# Patient Record
Sex: Female | Born: 1975 | Race: White | Hispanic: No | Marital: Married | Smoking: Never smoker
Health system: Southern US, Community
[De-identification: ages and names within clinical notes are randomized; demographics above are authoritative.]

## PROBLEM LIST (undated history)

## (undated) DIAGNOSIS — M791 Myalgia, unspecified site: Secondary | ICD-10-CM

## (undated) DIAGNOSIS — E559 Vitamin D deficiency, unspecified: Secondary | ICD-10-CM

## (undated) DIAGNOSIS — Z5189 Encounter for other specified aftercare: Secondary | ICD-10-CM

## (undated) DIAGNOSIS — M1990 Unspecified osteoarthritis, unspecified site: Secondary | ICD-10-CM

## (undated) DIAGNOSIS — F419 Anxiety disorder, unspecified: Secondary | ICD-10-CM

## (undated) DIAGNOSIS — T7840XA Allergy, unspecified, initial encounter: Secondary | ICD-10-CM

## (undated) DIAGNOSIS — Z8489 Family history of other specified conditions: Secondary | ICD-10-CM

## (undated) DIAGNOSIS — F32A Depression, unspecified: Secondary | ICD-10-CM

## (undated) DIAGNOSIS — H547 Unspecified visual loss: Secondary | ICD-10-CM

## (undated) DIAGNOSIS — K259 Gastric ulcer, unspecified as acute or chronic, without hemorrhage or perforation: Secondary | ICD-10-CM

## (undated) DIAGNOSIS — R51 Headache: Secondary | ICD-10-CM

## (undated) DIAGNOSIS — F329 Major depressive disorder, single episode, unspecified: Secondary | ICD-10-CM

## (undated) DIAGNOSIS — R0602 Shortness of breath: Secondary | ICD-10-CM

## (undated) DIAGNOSIS — K219 Gastro-esophageal reflux disease without esophagitis: Secondary | ICD-10-CM

## (undated) DIAGNOSIS — D649 Anemia, unspecified: Secondary | ICD-10-CM

## (undated) DIAGNOSIS — G43909 Migraine, unspecified, not intractable, without status migrainosus: Secondary | ICD-10-CM

## (undated) DIAGNOSIS — G8929 Other chronic pain: Secondary | ICD-10-CM

## (undated) DIAGNOSIS — F988 Other specified behavioral and emotional disorders with onset usually occurring in childhood and adolescence: Secondary | ICD-10-CM

## (undated) DIAGNOSIS — E538 Deficiency of other specified B group vitamins: Secondary | ICD-10-CM

## (undated) DIAGNOSIS — M2550 Pain in unspecified joint: Secondary | ICD-10-CM

## (undated) DIAGNOSIS — E669 Obesity, unspecified: Secondary | ICD-10-CM

## (undated) DIAGNOSIS — R600 Localized edema: Secondary | ICD-10-CM

## (undated) DIAGNOSIS — G4730 Sleep apnea, unspecified: Secondary | ICD-10-CM

## (undated) DIAGNOSIS — R519 Headache, unspecified: Secondary | ICD-10-CM

## (undated) DIAGNOSIS — N926 Irregular menstruation, unspecified: Secondary | ICD-10-CM

## (undated) DIAGNOSIS — R1310 Dysphagia, unspecified: Secondary | ICD-10-CM

## (undated) DIAGNOSIS — M797 Fibromyalgia: Secondary | ICD-10-CM

## (undated) DIAGNOSIS — E739 Lactose intolerance, unspecified: Secondary | ICD-10-CM

## (undated) DIAGNOSIS — M7070 Other bursitis of hip, unspecified hip: Secondary | ICD-10-CM

## (undated) HISTORY — DX: Other bursitis of hip, unspecified hip: M70.70

## (undated) HISTORY — DX: Unspecified visual loss: H54.7

## (undated) HISTORY — PX: SPINE SURGERY: SHX786

## (undated) HISTORY — DX: Gastric ulcer, unspecified as acute or chronic, without hemorrhage or perforation: K25.9

## (undated) HISTORY — DX: Anemia, unspecified: D64.9

## (undated) HISTORY — DX: Gastro-esophageal reflux disease without esophagitis: K21.9

## (undated) HISTORY — PX: TOTAL ABDOMINAL HYSTERECTOMY: SHX209

## (undated) HISTORY — DX: Pain in unspecified joint: M25.50

## (undated) HISTORY — PX: LUMBAR LAMINECTOMY: SHX95

## (undated) HISTORY — DX: Depression, unspecified: F32.A

## (undated) HISTORY — DX: Obesity, unspecified: E66.9

## (undated) HISTORY — DX: Shortness of breath: R06.02

## (undated) HISTORY — DX: Other chronic pain: G89.29

## (undated) HISTORY — DX: Allergy, unspecified, initial encounter: T78.40XA

## (undated) HISTORY — DX: Headache: R51

## (undated) HISTORY — DX: Deficiency of other specified B group vitamins: E53.8

## (undated) HISTORY — DX: Irregular menstruation, unspecified: N92.6

## (undated) HISTORY — DX: Other specified behavioral and emotional disorders with onset usually occurring in childhood and adolescence: F98.8

## (undated) HISTORY — DX: Myalgia, unspecified site: M79.10

## (undated) HISTORY — DX: Unspecified osteoarthritis, unspecified site: M19.90

## (undated) HISTORY — DX: Anxiety disorder, unspecified: F41.9

## (undated) HISTORY — DX: Localized edema: R60.0

## (undated) HISTORY — DX: Migraine, unspecified, not intractable, without status migrainosus: G43.909

## (undated) HISTORY — DX: Dysphagia, unspecified: R13.10

## (undated) HISTORY — DX: Encounter for other specified aftercare: Z51.89

## (undated) HISTORY — DX: Sleep apnea, unspecified: G47.30

## (undated) HISTORY — DX: Lactose intolerance, unspecified: E73.9

## (undated) HISTORY — DX: Fibromyalgia: M79.7

## (undated) HISTORY — DX: Vitamin D deficiency, unspecified: E55.9

## (undated) HISTORY — DX: Headache, unspecified: R51.9

## (undated) HISTORY — DX: Major depressive disorder, single episode, unspecified: F32.9

## (undated) HISTORY — PX: ABDOMINAL HYSTERECTOMY: SHX81

---
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
## 2001-07-17 ENCOUNTER — Other Ambulatory Visit: Admission: RE | Admit: 2001-07-17 | Discharge: 2001-07-17 | Payer: Self-pay | Admitting: Family Medicine

## 2001-10-29 HISTORY — PX: DILATION AND CURETTAGE OF UTERUS: SHX78

## 2002-06-04 ENCOUNTER — Encounter (INDEPENDENT_AMBULATORY_CARE_PROVIDER_SITE_OTHER): Payer: Self-pay | Admitting: *Deleted

## 2002-06-04 ENCOUNTER — Ambulatory Visit (HOSPITAL_COMMUNITY): Admission: RE | Admit: 2002-06-04 | Discharge: 2002-06-04 | Payer: Self-pay | Admitting: Obstetrics and Gynecology

## 2003-02-19 ENCOUNTER — Other Ambulatory Visit: Admission: RE | Admit: 2003-02-19 | Discharge: 2003-02-19 | Payer: Self-pay | Admitting: Obstetrics and Gynecology

## 2003-06-23 ENCOUNTER — Ambulatory Visit (HOSPITAL_COMMUNITY): Admission: RE | Admit: 2003-06-23 | Discharge: 2003-06-23 | Payer: Self-pay | Admitting: Obstetrics and Gynecology

## 2003-09-04 ENCOUNTER — Inpatient Hospital Stay (HOSPITAL_COMMUNITY): Admission: AD | Admit: 2003-09-04 | Discharge: 2003-09-04 | Payer: Self-pay | Admitting: Obstetrics & Gynecology

## 2003-09-20 ENCOUNTER — Inpatient Hospital Stay (HOSPITAL_COMMUNITY): Admission: AD | Admit: 2003-09-20 | Discharge: 2003-09-22 | Payer: Self-pay | Admitting: Obstetrics and Gynecology

## 2003-11-03 ENCOUNTER — Other Ambulatory Visit: Admission: RE | Admit: 2003-11-03 | Discharge: 2003-11-03 | Payer: Self-pay | Admitting: Obstetrics and Gynecology

## 2004-12-11 ENCOUNTER — Other Ambulatory Visit: Admission: RE | Admit: 2004-12-11 | Discharge: 2004-12-11 | Payer: Self-pay | Admitting: Obstetrics and Gynecology

## 2005-03-19 ENCOUNTER — Ambulatory Visit: Payer: Self-pay | Admitting: Internal Medicine

## 2005-04-12 ENCOUNTER — Ambulatory Visit: Payer: Self-pay | Admitting: Family Medicine

## 2005-08-03 ENCOUNTER — Ambulatory Visit: Payer: Self-pay | Admitting: Family Medicine

## 2005-08-21 ENCOUNTER — Ambulatory Visit: Payer: Self-pay | Admitting: Family Medicine

## 2005-12-14 ENCOUNTER — Other Ambulatory Visit: Admission: RE | Admit: 2005-12-14 | Discharge: 2005-12-14 | Payer: Self-pay | Admitting: Obstetrics and Gynecology

## 2006-02-25 ENCOUNTER — Ambulatory Visit: Payer: Self-pay | Admitting: Family Medicine

## 2006-02-26 HISTORY — PX: LIPOMA EXCISION: SHX5283

## 2006-03-01 ENCOUNTER — Ambulatory Visit (HOSPITAL_BASED_OUTPATIENT_CLINIC_OR_DEPARTMENT_OTHER): Admission: RE | Admit: 2006-03-01 | Discharge: 2006-03-01 | Payer: Self-pay | Admitting: Surgery

## 2006-03-01 ENCOUNTER — Encounter (INDEPENDENT_AMBULATORY_CARE_PROVIDER_SITE_OTHER): Payer: Self-pay | Admitting: Specialist

## 2006-03-29 ENCOUNTER — Ambulatory Visit: Payer: Self-pay | Admitting: Family Medicine

## 2006-08-12 ENCOUNTER — Ambulatory Visit: Payer: Self-pay | Admitting: Family Medicine

## 2006-08-26 ENCOUNTER — Ambulatory Visit: Payer: Self-pay | Admitting: Family Medicine

## 2007-08-27 ENCOUNTER — Ambulatory Visit: Payer: Self-pay | Admitting: Family Medicine

## 2007-08-27 DIAGNOSIS — J069 Acute upper respiratory infection, unspecified: Secondary | ICD-10-CM | POA: Insufficient documentation

## 2007-08-27 LAB — CONVERTED CEMR LAB: Rapid Strep: NEGATIVE

## 2007-08-28 ENCOUNTER — Encounter (INDEPENDENT_AMBULATORY_CARE_PROVIDER_SITE_OTHER): Payer: Self-pay | Admitting: Internal Medicine

## 2007-11-06 ENCOUNTER — Ambulatory Visit: Payer: Self-pay | Admitting: Family Medicine

## 2007-11-21 ENCOUNTER — Inpatient Hospital Stay (HOSPITAL_COMMUNITY): Admission: AD | Admit: 2007-11-21 | Discharge: 2007-11-21 | Payer: Self-pay | Admitting: Obstetrics and Gynecology

## 2008-03-02 ENCOUNTER — Inpatient Hospital Stay (HOSPITAL_COMMUNITY): Admission: AD | Admit: 2008-03-02 | Discharge: 2008-03-04 | Payer: Self-pay | Admitting: Obstetrics and Gynecology

## 2008-07-22 ENCOUNTER — Ambulatory Visit: Payer: Self-pay | Admitting: Family Medicine

## 2008-07-22 DIAGNOSIS — L255 Unspecified contact dermatitis due to plants, except food: Secondary | ICD-10-CM | POA: Insufficient documentation

## 2008-08-03 ENCOUNTER — Ambulatory Visit: Payer: Self-pay | Admitting: Family Medicine

## 2008-08-03 LAB — CONVERTED CEMR LAB: Rapid Strep: NEGATIVE

## 2008-08-06 ENCOUNTER — Telehealth: Payer: Self-pay | Admitting: Family Medicine

## 2008-09-03 ENCOUNTER — Encounter: Admission: RE | Admit: 2008-09-03 | Discharge: 2008-09-03 | Payer: Self-pay | Admitting: Obstetrics and Gynecology

## 2008-12-16 ENCOUNTER — Ambulatory Visit: Payer: Self-pay | Admitting: Family Medicine

## 2008-12-16 DIAGNOSIS — R609 Edema, unspecified: Secondary | ICD-10-CM | POA: Insufficient documentation

## 2008-12-16 DIAGNOSIS — R079 Chest pain, unspecified: Secondary | ICD-10-CM | POA: Insufficient documentation

## 2008-12-20 ENCOUNTER — Ambulatory Visit: Payer: Self-pay | Admitting: Family Medicine

## 2008-12-20 DIAGNOSIS — R5383 Other fatigue: Secondary | ICD-10-CM

## 2008-12-20 DIAGNOSIS — R5381 Other malaise: Secondary | ICD-10-CM | POA: Insufficient documentation

## 2008-12-26 LAB — CONVERTED CEMR LAB
ALT: 18 units/L (ref 0–35)
AST: 20 units/L (ref 0–37)
Albumin: 3.9 g/dL (ref 3.5–5.2)
Alkaline Phosphatase: 64 units/L (ref 39–117)
BUN: 11 mg/dL (ref 6–23)
Bilirubin, Direct: 0.1 mg/dL (ref 0.0–0.3)
CO2: 29 meq/L (ref 19–32)
Calcium: 8.9 mg/dL (ref 8.4–10.5)
Chloride: 105 meq/L (ref 96–112)
Cholesterol: 198 mg/dL (ref 0–200)
Creatinine, Ser: 0.8 mg/dL (ref 0.4–1.2)
GFR calc Af Amer: 106 mL/min
GFR calc non Af Amer: 87 mL/min
Glucose, Bld: 89 mg/dL (ref 70–99)
HDL: 37.4 mg/dL — ABNORMAL LOW (ref >39.0)
LDL Cholesterol: 138 mg/dL — ABNORMAL HIGH (ref 0–99)
Potassium: 4.4 meq/L (ref 3.5–5.1)
Sodium: 140 meq/L (ref 135–145)
Total Bilirubin: 0.7 mg/dL (ref 0.3–1.2)
Total CHOL/HDL Ratio: 5.3
Total Protein: 7.2 g/dL (ref 6.0–8.3)
Triglycerides: 111 mg/dL (ref 0–149)
VLDL: 22 mg/dL (ref 0–40)

## 2008-12-27 ENCOUNTER — Ambulatory Visit: Payer: Self-pay

## 2008-12-27 ENCOUNTER — Encounter (INDEPENDENT_AMBULATORY_CARE_PROVIDER_SITE_OTHER): Payer: Self-pay | Admitting: *Deleted

## 2008-12-27 ENCOUNTER — Encounter: Payer: Self-pay | Admitting: Family Medicine

## 2009-12-12 ENCOUNTER — Ambulatory Visit: Payer: Self-pay | Admitting: Family Medicine

## 2009-12-12 DIAGNOSIS — R42 Dizziness and giddiness: Secondary | ICD-10-CM | POA: Insufficient documentation

## 2010-02-13 ENCOUNTER — Ambulatory Visit: Payer: Self-pay | Admitting: Family Medicine

## 2010-02-15 ENCOUNTER — Telehealth: Payer: Self-pay | Admitting: Family Medicine

## 2010-04-27 ENCOUNTER — Ambulatory Visit: Payer: Self-pay | Admitting: Family Medicine

## 2010-05-11 ENCOUNTER — Ambulatory Visit: Payer: Self-pay | Admitting: Family Medicine

## 2010-06-12 ENCOUNTER — Ambulatory Visit: Payer: Self-pay | Admitting: Family Medicine

## 2010-06-21 ENCOUNTER — Encounter: Payer: Self-pay | Admitting: Family Medicine

## 2010-07-31 ENCOUNTER — Emergency Department (HOSPITAL_COMMUNITY): Admission: EM | Admit: 2010-07-31 | Discharge: 2010-08-01 | Payer: Self-pay | Admitting: Emergency Medicine

## 2010-08-02 ENCOUNTER — Encounter: Payer: Self-pay | Admitting: Family Medicine

## 2010-08-07 ENCOUNTER — Ambulatory Visit (HOSPITAL_COMMUNITY): Admission: RE | Admit: 2010-08-07 | Discharge: 2010-08-07 | Payer: Self-pay | Admitting: General Surgery

## 2010-08-09 ENCOUNTER — Encounter
Admission: RE | Admit: 2010-08-09 | Discharge: 2010-09-28 | Payer: Self-pay | Source: Home / Self Care | Attending: General Surgery | Admitting: General Surgery

## 2010-08-14 ENCOUNTER — Ambulatory Visit (HOSPITAL_COMMUNITY): Admission: RE | Admit: 2010-08-14 | Discharge: 2010-08-14 | Payer: Self-pay | Admitting: General Surgery

## 2010-08-21 ENCOUNTER — Encounter: Admission: RE | Admit: 2010-08-21 | Discharge: 2010-08-21 | Payer: Self-pay | Admitting: Obstetrics and Gynecology

## 2010-09-04 ENCOUNTER — Encounter: Admission: RE | Admit: 2010-09-04 | Discharge: 2010-09-04 | Payer: Self-pay | Admitting: Obstetrics and Gynecology

## 2010-10-06 ENCOUNTER — Encounter: Payer: Self-pay | Admitting: Family Medicine

## 2010-10-09 ENCOUNTER — Telehealth: Payer: Self-pay | Admitting: Family Medicine

## 2010-10-10 ENCOUNTER — Telehealth: Payer: Self-pay | Admitting: Family Medicine

## 2010-10-17 ENCOUNTER — Ambulatory Visit (HOSPITAL_COMMUNITY)
Admission: RE | Admit: 2010-10-17 | Discharge: 2010-10-17 | Payer: Self-pay | Source: Home / Self Care | Attending: General Surgery | Admitting: General Surgery

## 2010-10-17 HISTORY — PX: LAPAROSCOPIC GASTRIC BANDING: SHX1100

## 2010-10-31 ENCOUNTER — Encounter
Admission: RE | Admit: 2010-10-31 | Discharge: 2010-11-28 | Payer: Self-pay | Source: Home / Self Care | Attending: General Surgery | Admitting: General Surgery

## 2010-11-03 ENCOUNTER — Encounter: Payer: Self-pay | Admitting: Family Medicine

## 2010-11-28 NOTE — Letter (Signed)
Summary: *Referral Letter  [facility]  [address] [place], [place] [address]   Phone: [phone]  Fax: [phone]    06/21/2010  Thank you in advance for agreeing to see my patient:  [name] [address] [place], [place]  [address]  Phone: [phone]  Reason for Referral: Bariatric Surgery  To whom it may concern: The above named patient has been seen in our office since 2002.  Her current wieght is 272 pounds, heigh 65 inches, and BMI is 45.47.  The patient has undergone multiple weight loss attempts including Doylene Bode, TOPS, phenteramine.  I feel this patient would benefit from weight loss surgery because she has been unsuccessful losing weight with other methods and has a strong family history of HTN, DM and other comorbid conditions.  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   Past Medical History: 1)  NSVD, 5/09    Thank you again for agreeing to see our patient; please contact us if you have any further questions or need additional information.  Sincerely,  Ruthe Mannan MD

## 2010-11-28 NOTE — Assessment & Plan Note (Signed)
Summary: 1 MONTH FOLLOW UP/[facility]   Vital Signs:  Patient profile:   35 year old female Height:      65 inches Weight:      272.25 pounds BMI:     45.47 Temp:     98.3 degrees F oral Pulse rate:   76 / minute Pulse rhythm:   regular BP sitting:   130 / 72  (left arm) Cuff size:   large  Vitals Entered By: Linde Gillis CMA Duncan Dull) (June 12, 2010 8:22 AM) CC: 1 month follow up   History of Present Illness: 35 yo here to follow up obesity.  Obesity- has been overweight her entire life.  Tried every diet she knows of including Weight watchers, TOPS, Doylene Bode.  Can't get the weight off and wanted to pursue Bariatric surgery.  We referred her for Bariatric sugery consultation. At  the time, consultation scared her but now that she has talked with more people that have had lap band, she is ready to pursue.   She has been most recently on Phenteramine which has not helped much.   She is exercising again.    Current Medications (verified): 1)  Cvs Ibuprofen 200 Mg Tabs (Ibuprofen) .... Take Three Tablet Two Times A Day As Needed 2)  Loestrin Fe 1.5/30 1.5-30 Mg-Mcg Tabs (Norethin Ace-Eth Estrad-Fe) .... Take 1 Tablet By Mouth Once A Day 3)  Lexapro 10 Mg Tabs (Escitalopram Oxalate) .... Take 1 Tablet By Mouth Once A Day 4)  Phentermine Hcl 15 Mg Caps (Phentermine Hcl) .Marland Kitchen.. 1 Tab By Mouth Every Morning Before Breakfast or 1-2 Hours After Breakfast.  Allergies: 1)  ! Augmentin 2)  ! Codeine  Past History:  Past Medical History: Last updated: 12/16/2008 NSVD, 5/09  Family History: Last updated: 12/16/2008 MGM, CAD, V. fib arrest  Social History: Last updated: 12/16/2008 Lives in [place]. Married  Review of Systems      See HPI  Physical Exam  General:  Well-developed,well-nourished,in no acute distress; alert,appropriate and cooperative throughout examination, morbidly obese. Normotensive Psych:  normally interactive, not anxious or depressed  appearing.   Impression & Recommendations:  Problem # 1:  MORBID OBESITY (ICD-278.01) Assessment Unchanged Time spent with patient 25 minutes, more than 50% of this time was spent counseling patient on obesity. She is ready to pursue lab band, we will await forms from surgical center.  Complete Medication List: 1)  Cvs Ibuprofen 200 Mg Tabs (Ibuprofen) .... Take three tablet two times a day as needed 2)  Loestrin Fe 1.5/30 1.5-30 Mg-mcg Tabs (Norethin ace-eth estrad-fe) .... Take 1 tablet by mouth once a day 3)  Lexapro 10 Mg Tabs (Escitalopram oxalate) .... Take 1 tablet by mouth once a day 4)  Phentermine Hcl 15 Mg Caps (Phentermine hcl) .Marland Kitchen.. 1 tab by mouth every morning before breakfast or 1-2 hours after breakfast.  Current Allergies (reviewed today): ! AUGMENTIN ! CODEINE

## 2010-11-28 NOTE — Assessment & Plan Note (Signed)
Summary: CONGESTION, DRAINAGE, BAD HEADACHES & CHILLS / LFW   Vital Signs:  Patient profile:   35 year old female Height:      65 inches Weight:      277.38 pounds BMI:     46.33 Temp:     98.6 degrees F oral Pulse rate:   88 / minute Pulse rhythm:   regular BP sitting:   118 / 84  (left arm) Cuff size:   large  Vitals Entered By: Lewanda Rife LPN (February 13, 2010 2:01 PM) CC: drainage, bad h/a and chills, congestion in head and low back pain   History of Present Illness: 35 yo here for two issues:  1.  Sinus congestion, subjective fever and chills that started 3 days ago.  Afebrile now and symptoms slowly improving.  Mild dry cough.  No wheezing or SOB.  Ears popping. Taking Dayquil and nyquil which have helped a little.  2.  Bariatric surgery- has been overweight her entire life.  Tried every diet she knows of including Weight watchers, TOPS, Doylene Bode.  Can't get the weight off and would like to pursue Bariatric Surgery.  Current Medications (verified): 1)  Cvs Ibuprofen 200 Mg Tabs (Ibuprofen) .... Take Three Tablet Two Times A Day As Needed 2)  Loestrin Fe 1.5/30 1.5-30 Mg-Mcg Tabs (Norethin Ace-Eth Estrad-Fe) .... Take 1 Tablet By Mouth Once A Day 3)  Lexapro 10 Mg Tabs (Escitalopram Oxalate) .... Take 1 Tablet By Mouth Once A Day 4)  Dayquil Multi-Symptom 30-325-10 Mg/60ml Liqd (Pseudoephedrine-Apap-Dm) .... Otc As Directed. 5)  Nyquil 60-7.03-27-999 Mg/8ml Liqd (Pseudoeph-Doxylamine-Dm-Apap) .... Otc As Directed.  Allergies: 1)  ! Augmentin 2)  ! Codeine  Review of Systems      See HPI General:  Complains of chills and fever. ENT:  Complains of earache, nasal congestion, sinus pressure, and sore throat. CV:  Denies chest pain or discomfort. Resp:  Complains of cough; denies shortness of breath, sputum productive, and wheezing. GI:  Denies abdominal pain, diarrhea, nausea, and vomiting.  Physical Exam  General:  Well-developed,well-nourished,in no acute  distress; alert,appropriate and cooperative throughout examination, congested. Ears:  TMs retracted bilaterally Nose:  boggy turbinates, - sinuses Mouth:  Oral mucosa and oropharynx without lesions or exudates.  Teeth in good repair. Lungs:  Normal respiratory effort, chest expands symmetrically. Lungs are clear to auscultation, no crackles or wheezes. Heart:  Normal rate and regular rhythm. S1 and S2 normal without gallop, murmur, click, rub or other extra sounds. Extremities:  no edema Psych:  normally interactive, not anxious or depressed appearing.   Impression & Recommendations:  Problem # 1:  URI (ICD-465.9) Assessment New Likely viral.  Continue supportive care.  See pt instructions for details. Her updated medication list for this problem includes:    Cvs Ibuprofen 200 Mg Tabs (Ibuprofen) .Marland Kitchen... Take three tablet two times a day as needed    Dayquil Multi-symptom 30-325-10 Mg/88ml Liqd (Pseudoephedrine-apap-dm) ..... Otc as directed.    Nyquil 60-7.03-27-999 Mg/92ml Liqd (Pseudoeph-doxylamine-dm-apap) ..... Otc as directed.  Problem # 2:  MORBID OBESITY (ICD-278.01) Assessment: Deteriorated Time spent with patient 25 minutes, more than 50% of this time was spent discussing bariatric surgery. She is very interested in Lap band.  Will refer to surgery.  Complete Medication List: 1)  Cvs Ibuprofen 200 Mg Tabs (Ibuprofen) .... Take three tablet two times a day as needed 2)  Loestrin Fe 1.5/30 1.5-30 Mg-mcg Tabs (Norethin ace-eth estrad-fe) .... Take 1 tablet by mouth once a day 3)  Lexapro 10 Mg Tabs (Escitalopram oxalate) .... Take 1 tablet by mouth once a day 4)  Dayquil Multi-symptom 30-325-10 Mg/33ml Liqd (Pseudoephedrine-apap-dm) .... Otc as directed. 5)  Nyquil 60-7.03-27-999 Mg/63ml Liqd (Pseudoeph-doxylamine-dm-apap) .... Otc as directed.  Other Orders: Surgical Referral (Surgery)  Patient Instructions: 1)    Drink lots of fluids.  Treat sympotmatically with Mucinex,  nasal saline irrigation, Sudafed and Tylenol/Ibuprofen.  You can use warm compresses.  Call if not improving as expected in 5-7 days.  2)  Stop by to see Shirlee Limerick on your way out to set up your surgery referral.  Current Allergies (reviewed today): ! AUGMENTIN ! CODEINE

## 2010-11-28 NOTE — Progress Notes (Signed)
Summary: Not feeling better  Phone Note Call from Patient Call back at Home Phone [phone]   Caller: Patient Call For: Ruthe Mannan MD Reason for Call: Talk to Nurse Summary of Call: Patient was seen on Monday and was told to call back if she was no better so we could call in an antibiotic for her.  Please advise.  Uses [facility]. Initial call taken by: Linde Gillis CMA Duncan Dull),  February 15, 2010 8:47 AM    New/Updated Medications: AZITHROMYCIN 250 MG  TABS (AZITHROMYCIN) 2 by  mouth today and then 1 daily for 4 days Prescriptions: AZITHROMYCIN 250 MG  TABS (AZITHROMYCIN) 2 by  mouth today and then 1 daily for 4 days  #6 x 0   Entered and Authorized by:   Ruthe Mannan MD   Signed by:   Ruthe Mannan MD on 02/15/2010   Method used:   Electronically to        [facility]. [address] [phone]* (retail)       [address], [place]  [address]       Ph: [phone]       Fax: [phone]   RxID:   [id number]

## 2010-11-28 NOTE — Letter (Signed)
Summary: Weight Loss Form & Info for Bariatric Surgery/[facility]  Weight Loss Form & Info for Bariatric Surgery/[facility]   Imported By: Lanelle Bal 06/29/2010 12:03:05  _____________________________________________________________________  External Attachment:    Type:   Image     Comment:   External Document

## 2010-11-28 NOTE — Assessment & Plan Note (Signed)
Summary: 2 WK F/U DLO   Vital Signs:  Patient profile:   35 year old female Height:      65 inches Weight:      273 pounds BMI:     45.59 Temp:     98.3 degrees F oral Pulse rate:   72 / minute Pulse rhythm:   regular BP sitting:   130 / 80  (left arm) Cuff size:   large  Vitals Entered By: Linde Gillis CMA Duncan Dull) (May 11, 2010 12:20 PM) CC: 2 week follow up   History of Present Illness: 35 yo here to follow up obesity.  Obesity- has been overweight her entire life.  Tried every diet she knows of including Weight watchers, TOPS, Doylene Bode.  Can't get the weight off and wanted to pursue Bariatric surgery.  We referred her for Bariatric sugery consultation.  She said that the consult scared her, she is not willing to have surgery yet. Started tops 3 weeks ago and Phenteramine 15 mg qam. Feels great, has already lost 6 pounds and in 2 weeks. No palpitations or GI side effects. Exercising again makes her feel better and has given her more energy.    Current Medications (verified): 1)  Cvs Ibuprofen 200 Mg Tabs (Ibuprofen) .... Take Three Tablet Two Times A Day As Needed 2)  Loestrin Fe 1.5/30 1.5-30 Mg-Mcg Tabs (Norethin Ace-Eth Estrad-Fe) .... Take 1 Tablet By Mouth Once A Day 3)  Lexapro 10 Mg Tabs (Escitalopram Oxalate) .... Take 1 Tablet By Mouth Once A Day 4)  Phentermine Hcl 15 Mg Caps (Phentermine Hcl) .Marland Kitchen.. 1 Tab By Mouth Every Morning Before Breakfast or 1-2 Hours After Breakfast.  Allergies: 1)  ! Augmentin 2)  ! Codeine  Review of Systems      See HPI General:  Denies malaise. CV:  Denies chest pain or discomfort.  Physical Exam  General:  Well-developed,well-nourished,in no acute distress; alert,appropriate and cooperative throughout examination, morbidly obese. Normotensive Psych:  normally interactive, not anxious or depressed appearing.   Impression & Recommendations:  Problem # 1:  MORBID OBESITY (ICD-278.01) Assessment Improved Doing well. Time  spent with patient 25 minutes, more than 50% of this time was spent counseling patient on weight loss. Continue phenteramine at current dose.  BP and pulse normal. Follow up with me in one month.  Complete Medication List: 1)  Cvs Ibuprofen 200 Mg Tabs (Ibuprofen) .... Take three tablet two times a day as needed 2)  Loestrin Fe 1.5/30 1.5-30 Mg-mcg Tabs (Norethin ace-eth estrad-fe) .... Take 1 tablet by mouth once a day 3)  Lexapro 10 Mg Tabs (Escitalopram oxalate) .... Take 1 tablet by mouth once a day 4)  Phentermine Hcl 15 Mg Caps (Phentermine hcl) .Marland Kitchen.. 1 tab by mouth every morning before breakfast or 1-2 hours after breakfast.  Patient Instructions: 1)  Great to see you. 2)  Keep up the great work. 3)  Come see me in one month. Prescriptions: PHENTERMINE HCL 15 MG CAPS (PHENTERMINE HCL) 1 tab by mouth every morning before breakfast or 1-2 hours after breakfast.  #30 x 0   Entered and Authorized by:   Ruthe Mannan MD   Signed by:   Ruthe Mannan MD on 05/11/2010   Method used:   Print then Give to Patient   RxID:   [id number]   Current Allergies (reviewed today): ! AUGMENTIN ! CODEINE

## 2010-11-28 NOTE — Assessment & Plan Note (Signed)
Summary: fatigue, dizzy/ alc   Vital Signs:  Patient profile:   35 year old female Height:      65 inches Weight:      275 pounds BMI:     45.93 Temp:     98.5 degrees F oral Pulse rate:   84 / minute Pulse rhythm:   regular BP sitting:   130 / 88  (left arm) Cuff size:   large  Vitals Entered By: Delilah Shan CMA Duncan Dull) (December 12, 2009 4:03 PM) CC: fatigue, dizzy, migraines more often.   History of Present Illness: 35 yo presents with 2 weeks of fatigue, headache, dizziness not worsened by position. No fevers or chills. No cough.  Did have ear pain a couple of days ago but that resolved. Of note, Dr. Adalberto Ill started her on Lexapro and Loestrin a day before these symptoms started. Depressive symptoms much improved, less crying spells. No n/v/d but does have a little decreased appetite. No syncope, CP, SOB.  Current Medications (verified): 1)  Cvs Ibuprofen 200 Mg Tabs (Ibuprofen) .... Take Three Tablet Two Times A Day As Needed 2)  Loestrin Fe 1.5/30 1.5-30 Mg-Mcg Tabs (Norethin Ace-Eth Estrad-Fe) .... Take 1 Tablet By Mouth Once A Day 3)  Lexapro 10 Mg Tabs (Escitalopram Oxalate) .... Take 1 Tablet By Mouth Once A Day  Allergies: 1)  ! Augmentin 2)  ! Codeine  Review of Systems      See HPI General:  Denies chills and fever. Eyes:  Denies blurring. CV:  Denies chest pain or discomfort. Resp:  Denies shortness of breath. GI:  Denies nausea and vomiting. Derm:  Denies rash. Neuro:  Complains of headaches; denies sensation of room spinning and visual disturbances. Psych:  Denies suicidal thoughts/plans, thoughts of violence, unusual visions or sounds, and thoughts /plans of harming others.  Physical Exam  General:  Well-developed,well-nourished,in no acute distress; alert,appropriate and cooperative throughout examination Eyes:  no nystagmus, dix hallpike neg Ears:  External ear exam shows no significant lesions or deformities.  Otoscopic examination reveals  clear canals, tympanic membranes are intact bilaterally without bulging, retraction, inflammation or discharge. Hearing is grossly normal bilaterally. Mouth:  Oral mucosa and oropharynx without lesions or exudates.  Teeth in good repair. Lungs:  Normal respiratory effort, chest expands symmetrically. Lungs are clear to auscultation, no crackles or wheezes. Heart:  Normal rate and regular rhythm. S1 and S2 normal without gallop, murmur, click, rub or other extra sounds. Psych:  normally interactive, not anxious or depressed appearing.   Impression & Recommendations:  Problem # 1:  DIZZINESS (ICD-780.4) Assessment New Seems most consistent with side effect from Lexapro. GIven patient infor handout from up to date.  Advised to give it a couple more week, if symptoms persist, need to be seen again and to switch to diffferent antidepressant.  Has f/u with Dr. Adalberto Ill on 3/3. Red flags given for f/u sooner such as syncope, CP, shortness of breath, or vomiting.  Complete Medication List: 1)  Cvs Ibuprofen 200 Mg Tabs (Ibuprofen) .... Take three tablet two times a day as needed 2)  Loestrin Fe 1.5/30 1.5-30 Mg-mcg Tabs (Norethin ace-eth estrad-fe) .... Take 1 tablet by mouth once a day 3)  Lexapro 10 Mg Tabs (Escitalopram oxalate) .... Take 1 tablet by mouth once a day  Current Allergies (reviewed today): ! AUGMENTIN ! CODEINE

## 2010-11-28 NOTE — Consult Note (Signed)
Summary: [facility]   Imported By: Lanelle Bal 09/13/2010 14:14:22  _____________________________________________________________________  External Attachment:    Type:   Image     Comment:   External Document

## 2010-11-28 NOTE — Assessment & Plan Note (Signed)
Summary: DISCUSS WEIGHT LOSS,FILL OUT FORM/   Vital Signs:  Patient profile:   35 year old female Height:      65 inches Weight:      279.13 pounds BMI:     46.62 Temp:     99.2 degrees F oral Pulse rate:   72 / minute Pulse rhythm:   regular BP sitting:   130 / 82  (left arm) Cuff size:   large  Vitals Entered By: Linde Gillis CMA Duncan Dull) (April 27, 2010 3:42 PM) CC: discuss weight loss, fill out forms   History of Present Illness: 35 yo here to discuss weight loss.    Obesity- has been overweight her entire life.  Tried every diet she knows of including Weight watchers, TOPS, Doylene Bode.  Can't get the weight off and wanted to pursue Bariatric surgery.  We referred her for Bariatric sugery consultation.  She said that the consult scared her, she is not willing to have surgery yet.  When she did TOPS, she lost 50 pounds.  She wants to try that again. Admits to overeating.   Her husband and kids are supportive but they are not overweight.  They still want to eat junk food but Tilly cannot have it in the house or she will eat it.  Current Medications (verified): 1)  Cvs Ibuprofen 200 Mg Tabs (Ibuprofen) .... Take Three Tablet Two Times A Day As Needed 2)  Loestrin Fe 1.5/30 1.5-30 Mg-Mcg Tabs (Norethin Ace-Eth Estrad-Fe) .... Take 1 Tablet By Mouth Once A Day 3)  Lexapro 10 Mg Tabs (Escitalopram Oxalate) .... Take 1 Tablet By Mouth Once A Day 4)  Phentermine Hcl 15 Mg Caps (Phentermine Hcl) .Marland Kitchen.. 1 Tab By Mouth Every Morning Before Breakfast or 1-2 Hours After Breakfast.  Allergies: 1)  ! Augmentin 2)  ! Codeine  Review of Systems      See HPI  Physical Exam  General:  Well-developed,well-nourished,in no acute distress; alert,appropriate and cooperative throughout examination, morbidly obese. Normotensive Psych:  normally interactive, not anxious or depressed appearing.   Impression & Recommendations:  Problem # 1:  MORBID OBESITY (ICD-278.01) Assessment  Deteriorated Time spent with patient 25 minutes, more than 50% of this time was spent counseling patient on obesity. We will start a short and monitored course of phenteramine to help curb her appetite and give her a boost.  I discussed risks, benefits, side effects, and gave her pt information handout.  She will follow up with me in 2 weeks.  Complete Medication List: 1)  Cvs Ibuprofen 200 Mg Tabs (Ibuprofen) .... Take three tablet two times a day as needed 2)  Loestrin Fe 1.5/30 1.5-30 Mg-mcg Tabs (Norethin ace-eth estrad-fe) .... Take 1 tablet by mouth once a day 3)  Lexapro 10 Mg Tabs (Escitalopram oxalate) .... Take 1 tablet by mouth once a day 4)  Phentermine Hcl 15 Mg Caps (Phentermine hcl) .Marland Kitchen.. 1 tab by mouth every morning before breakfast or 1-2 hours after breakfast.  Patient Instructions: 1)  Great to see you, Ms. Mottram. 2)  Please make appointment to see me in 2 weeks. Prescriptions: PHENTERMINE HCL 15 MG CAPS (PHENTERMINE HCL) 1 tab by mouth every morning before breakfast or 1-2 hours after breakfast.  #30 x 0   Entered and Authorized by:   Ruthe Mannan MD   Signed by:   Ruthe Mannan MD on 04/27/2010   Method used:   Print then Give to Patient   RxID:   [id number]   Current  Allergies (reviewed today): ! AUGMENTIN ! CODEINE

## 2010-11-30 NOTE — Letter (Signed)
Summary: [facility]   Imported By: Lanelle Bal 10/31/2010 09:36:39  _____________________________________________________________________  External Attachment:    Type:   Image     Comment:   External Document

## 2010-11-30 NOTE — Progress Notes (Signed)
Summary: regarding citalopram  Phone Note Refill Request Message from:  Fax from Pharmacy  Refills Requested: Medication #1:  CITALOPRAM HYDROBROMIDE 10 MG  TABS Take 1 tab by mouth daily.   Last Refilled: 10/09/2010 Script was sent in yesterday for citalopram, for a 30 day supply.  Pharmacy is asking for this to be changed to a 90 day supply, will cost the same as a 30 day supply.  [facility] in [place], [phone].  Initial call taken by: Lowella Petties CMA, AAMA,  October 10, 2010 10:52 AM    Prescriptions: CITALOPRAM HYDROBROMIDE 10 MG  TABS (CITALOPRAM HYDROBROMIDE) Take 1 tab by mouth daily  #90 x 0   Entered and Authorized by:   Ruthe Mannan MD   Signed by:   Ruthe Mannan MD on 10/10/2010   Method used:   Electronically to        [facility]. [address] (retail)       [address]       [place], [place]  [address]       Ph: [phone]       Fax: [phone]   RxID:   [id number]

## 2010-11-30 NOTE — Progress Notes (Signed)
Summary: Rx Antidepressant  Phone Note Call from Patient Call back at Home Phone [phone]   Caller: Patient Call For: Pam Mannan MD Summary of Call: Patient states that she has run out of her Lexapro which she states that you are aware that she has been taking this.  Patient has been getting this from her GYN and they were suppose to get a prior auth on this and did not. [facility] stated that they sent them a prior auth request and the GYN's office claims that they did not get the request. Patient has been out of this medication for over a week and is having major problems with depression and is scheduled to have lap band surgery next week.  Patient is very upset with her GYN's office and wants to know if you will prescribe another anti-depressant for her? Patient has checked with her insurance company and they will cover Sertraline, Citalopram and Fluoxetine.   Please let patient know if you can do this for her?        Pharmacy-[facility] Initial call taken by: Sydell Axon LPN,  October 09, 2010 2:59 PM  Follow-up for Phone Call        [facility], lets try citalopram. I will send rx to pharmacy.  she needs to f/u in one month. Pam Mannan MD  October 09, 2010 3:02 PM  Patient notified of rx. Patient will call back to schedule appt. for one month.  Follow-up by: Melody Comas,  October 09, 2010 3:30 PM    New/Updated Medications: CITALOPRAM HYDROBROMIDE 10 MG  TABS (CITALOPRAM HYDROBROMIDE) Take 1 tab by mouth daily Prescriptions: CITALOPRAM HYDROBROMIDE 10 MG  TABS (CITALOPRAM HYDROBROMIDE) Take 1 tab by mouth daily  #30 x 0   Entered and Authorized by:   Pam Mannan MD   Signed by:   Pam Mannan MD on 10/09/2010   Method used:   Electronically to        [facility]. [phone]* (retail)       [address], [place]  [address]       Ph: [phone]       Fax: [phone]   RxID:   [id number]

## 2010-12-06 NOTE — Letter (Signed)
Summary: Dr Andrey Campanile Note  Dr Andrey Campanile Note   Imported By: Kassie Mends 12/01/2010 11:03:00  _____________________________________________________________________  External Attachment:    Type:   Image     Comment:   External Document

## 2010-12-06 NOTE — Letter (Signed)
Summary: Dr. Andrey Campanile office note   Dr. Andrey Campanile office note   Imported By: Kassie Mends 12/01/2010 09:05:47  _____________________________________________________________________  External Attachment:    Type:   Image     Comment:   External Document  Appended Document: Dr. Andrey Campanile office note  doing well s/p lap band.

## 2010-12-11 ENCOUNTER — Encounter: Payer: BC Managed Care – PPO | Attending: General Surgery | Admitting: *Deleted

## 2010-12-11 DIAGNOSIS — Z9884 Bariatric surgery status: Secondary | ICD-10-CM | POA: Insufficient documentation

## 2010-12-11 DIAGNOSIS — Z713 Dietary counseling and surveillance: Secondary | ICD-10-CM | POA: Insufficient documentation

## 2010-12-11 DIAGNOSIS — Z09 Encounter for follow-up examination after completed treatment for conditions other than malignant neoplasm: Secondary | ICD-10-CM | POA: Insufficient documentation

## 2010-12-18 ENCOUNTER — Encounter: Payer: Self-pay | Admitting: Family Medicine

## 2011-01-04 NOTE — Letter (Signed)
Summary: [facility]   Imported By: Maryln Gottron 12/22/2010 15:49:12  _____________________________________________________________________  External Attachment:    Type:   Image     Comment:   External Document  Appended Document: [facility] one month post op doing well

## 2011-01-08 LAB — CBC
HCT: 41.3 % (ref 36.0–46.0)
Hemoglobin: 13.8 g/dL (ref 12.0–15.0)
MCH: 27.5 pg (ref 26.0–34.0)
MCHC: 33.4 g/dL (ref 30.0–36.0)
MCV: 82.3 fL (ref 78.0–100.0)
Platelets: 225 10*3/uL (ref 150–400)
RBC: 5.02 MIL/uL (ref 3.87–5.11)
RDW: 15.2 % (ref 11.5–15.5)
WBC: 7.3 10*3/uL (ref 4.0–10.5)

## 2011-01-08 LAB — COMPREHENSIVE METABOLIC PANEL
ALT: 34 U/L (ref 0–35)
AST: 23 U/L (ref 0–37)
Albumin: 3.9 g/dL (ref 3.5–5.2)
Alkaline Phosphatase: 69 U/L (ref 39–117)
BUN: 12 mg/dL (ref 6–23)
CO2: 26 mEq/L (ref 19–32)
Calcium: 9 mg/dL (ref 8.4–10.5)
Chloride: 105 mEq/L (ref 96–112)
Creatinine, Ser: 0.77 mg/dL (ref 0.4–1.2)
GFR calc Af Amer: >60 mL/min (ref >60)
GFR calc non Af Amer: >60 mL/min (ref >60)
Glucose, Bld: 95 mg/dL (ref 70–99)
Potassium: 4.4 mEq/L (ref 3.5–5.1)
Sodium: 138 mEq/L (ref 135–145)
Total Bilirubin: 0.6 mg/dL (ref 0.3–1.2)
Total Protein: 7.6 g/dL (ref 6.0–8.3)

## 2011-01-08 LAB — PREGNANCY, URINE: Preg Test, Ur: NEGATIVE

## 2011-01-08 LAB — DIFFERENTIAL
Basophils Absolute: 0 10*3/uL (ref 0.0–0.1)
Basophils Relative: 0 % (ref 0–1)
Eosinophils Absolute: 0.3 10*3/uL (ref 0.0–0.7)
Eosinophils Relative: 4 % (ref 0–5)
Lymphocytes Relative: 26 % (ref 12–46)
Lymphs Abs: 1.9 10*3/uL (ref 0.7–4.0)
Monocytes Absolute: 0.4 10*3/uL (ref 0.1–1.0)
Monocytes Relative: 6 % (ref 3–12)
Neutro Abs: 4.7 10*3/uL (ref 1.7–7.7)
Neutrophils Relative %: 65 % (ref 43–77)

## 2011-01-08 LAB — SURGICAL PCR SCREEN
MRSA, PCR: NEGATIVE
Staphylococcus aureus: POSITIVE — AB

## 2011-01-11 LAB — BASIC METABOLIC PANEL
BUN: 8 mg/dL (ref 6–23)
CO2: 23 mEq/L (ref 19–32)
Calcium: 8.5 mg/dL (ref 8.4–10.5)
Chloride: 103 mEq/L (ref 96–112)
Creatinine, Ser: 0.78 mg/dL (ref 0.4–1.2)
GFR calc Af Amer: >60 mL/min (ref >60)
GFR calc non Af Amer: >60 mL/min (ref >60)
Glucose, Bld: 104 mg/dL — ABNORMAL HIGH (ref 70–99)
Potassium: 3.7 mEq/L (ref 3.5–5.1)
Sodium: 133 mEq/L — ABNORMAL LOW (ref 135–145)

## 2011-01-11 LAB — DIFFERENTIAL
Basophils Absolute: 0.1 10*3/uL (ref 0.0–0.1)
Basophils Relative: 1 % (ref 0–1)
Eosinophils Absolute: 0.4 10*3/uL (ref 0.0–0.7)
Eosinophils Relative: 4 % (ref 0–5)
Lymphocytes Relative: 22 % (ref 12–46)
Lymphs Abs: 2.3 10*3/uL (ref 0.7–4.0)
Monocytes Absolute: 0.7 10*3/uL (ref 0.1–1.0)
Monocytes Relative: 7 % (ref 3–12)
Neutro Abs: 7.1 10*3/uL (ref 1.7–7.7)
Neutrophils Relative %: 67 % (ref 43–77)

## 2011-01-11 LAB — GC/CHLAMYDIA PROBE AMP, GENITAL
Chlamydia, DNA Probe: NEGATIVE
GC Probe Amp, Genital: NEGATIVE

## 2011-01-11 LAB — CBC
HCT: 37.2 % (ref 36.0–46.0)
Hemoglobin: 12.7 g/dL (ref 12.0–15.0)
MCH: 27.2 pg (ref 26.0–34.0)
MCHC: 34.1 g/dL (ref 30.0–36.0)
MCV: 79.7 fL (ref 78.0–100.0)
Platelets: 241 10*3/uL (ref 150–400)
RBC: 4.67 MIL/uL (ref 3.87–5.11)
RDW: 15.8 % — ABNORMAL HIGH (ref 11.5–15.5)
WBC: 10.5 10*3/uL (ref 4.0–10.5)

## 2011-01-11 LAB — WET PREP, GENITAL
Trich, Wet Prep: NONE SEEN
WBC, Wet Prep HPF POC: NONE SEEN
Yeast Wet Prep HPF POC: NONE SEEN

## 2011-01-11 LAB — URINALYSIS, ROUTINE W REFLEX MICROSCOPIC
Bilirubin Urine: NEGATIVE
Glucose, UA: NEGATIVE mg/dL
Hgb urine dipstick: NEGATIVE
Ketones, ur: NEGATIVE mg/dL
Nitrite: NEGATIVE
Protein, ur: NEGATIVE mg/dL
Specific Gravity, Urine: 1.025 (ref 1.005–1.030)
Urobilinogen, UA: 0.2 mg/dL (ref 0.0–1.0)
pH: 6 (ref 5.0–8.0)

## 2011-01-11 LAB — POCT PREGNANCY, URINE: Preg Test, Ur: NEGATIVE

## 2011-01-18 ENCOUNTER — Encounter: Payer: Self-pay | Admitting: Family Medicine

## 2011-01-18 ENCOUNTER — Ambulatory Visit: Payer: BC Managed Care – PPO | Admitting: Family Medicine

## 2011-01-22 ENCOUNTER — Ambulatory Visit: Payer: Self-pay | Admitting: *Deleted

## 2011-01-22 ENCOUNTER — Ambulatory Visit (INDEPENDENT_AMBULATORY_CARE_PROVIDER_SITE_OTHER): Payer: BC Managed Care – PPO | Admitting: Family Medicine

## 2011-01-22 ENCOUNTER — Encounter: Payer: Self-pay | Admitting: Family Medicine

## 2011-01-22 DIAGNOSIS — F3289 Other specified depressive episodes: Secondary | ICD-10-CM

## 2011-01-22 DIAGNOSIS — F32A Depression, unspecified: Secondary | ICD-10-CM

## 2011-01-22 DIAGNOSIS — F659 Paraphilia, unspecified: Secondary | ICD-10-CM

## 2011-01-22 DIAGNOSIS — R6882 Decreased libido: Secondary | ICD-10-CM

## 2011-01-22 DIAGNOSIS — F329 Major depressive disorder, single episode, unspecified: Secondary | ICD-10-CM

## 2011-01-22 DIAGNOSIS — F339 Major depressive disorder, recurrent, unspecified: Secondary | ICD-10-CM | POA: Insufficient documentation

## 2011-01-22 MED ORDER — BUPROPION HCL ER (XL) 150 MG PO TB24: 150.0000 mg | ORAL_TABLET | ORAL | Status: DC

## 2011-01-22 NOTE — Patient Instructions (Signed)
Great to see you. Let's try Wellbutrin.  Call me in one month with an update.  IMPORTANT: HOW TO USE THIS INFORMATION:  This is a summary and does NOT have all possible information about this product. This information does not assure that this product is safe, effective, or appropriate for you. This information is not individual medical advice and does not substitute for the advice of your health care professional. Always ask your health care professional for complete information about this product and your specific health needs.    BUPROPION - ORAL ([id number]-on)    COMMON BRAND NAME(S): Wellbutrin    WARNING:  Bupropion is an antidepressant used to treat a variety of conditions, including depression, other mental/mood disorders, and smoking cessation. Antidepressants can help prevent suicidal thoughts/attempts and provide other important benefits. However, studies have shown that a small number of people who take antidepressants for any condition may experience new or worsening depression, other mental/mood symptoms, or suicidal thoughts/attempts. Therefore, it is very important to talk with the doctor about the risks and benefits of antidepressant medication, even if treatment is not for a mental/mood condition. Tell the doctor immediately if you notice new or worsening depression/other psychiatric conditions, unusual behavior changes (including possible suicidal thoughts/attempts), or other mental/mood changes (including new/worsening anxiety, panic attacks, trouble sleeping, irritability, hostile/angry feelings, impulsive actions, severe restlessness, very rapid speech). Be especially watchful for these symptoms when a new antidepressant is started or when the dose is changed. If you are using bupropion to quit smoking, stop taking bupropion and contact your doctor immediately if you experience any of the symptoms listed above or if you have any of these symptoms after stopping treatment.    USES:   Bupropion is used to treat depression. It can improve your mood and feelings of well-being. It may work by helping to restore the balance of certain natural chemicals (neurotransmitters) in your brain.    OTHER USES:  This section contains uses of this drug that are not listed in the approved professional labeling for the drug but that may be prescribed by your health care professional. Use this drug for a condition that is listed in this section only if it has been so prescribed by your health care professional. Bupropion may also be used to treat attention deficit hyperactivity disorder (ADHD), or to help people quit smoking by decreasing cravings and nicotine withdrawal effects. This drug may also be used with other medications to treat bipolar disorder (depressive phase).    HOW TO USE:  Read the Patient Information Leaflet and Medication Guide available from your pharmacist before you start using bupropion and each time you get a refill. Consult your doctor or pharmacist if you have any questions. Take this medication by mouth, with or without food, usually three times daily. If stomach upset occurs, you may take this drug with food. It is important to take your doses at least 6 hours apart or as directed by your doctor to decrease your risk of having a seizure. Do not take more or less medication or take it more frequently than prescribed. Taking more than the recommended dose of bupropion may increase your risk of having a seizure. Do not take more than 150 milligrams as a single dose, and do not take more than 450 milligrams per day. Your dosage is based on your medical condition and response to therapy. Your dose may be slowly increased to limit side effects such as sleeplessness, and to decrease the risk of seizures. To  avoid trouble sleeping, do not take this medication too close to bedtime. Let your doctor know if sleeplessness becomes a problem. Use this medication regularly in order to get the  most benefit from it. To help you remember, use it at the same times each day. Do not stop taking this medication without consulting your doctor. Some conditions may become worse when the drug is suddenly stopped. Your dose may need to be gradually decreased. It may take 4 or more weeks before you notice the full benefit of this drug. Continue to take this medication as directed by your doctor even after you feel better. Talk to your doctor if your condition does not improve or if it worsens.    SIDE EFFECTS:  See also the How to Use, Precautions, and Warning sections. Nausea, vomiting, dry mouth, headache, constipation, increased sweating, joint aches, sore throat, blurred vision, strange taste in the mouth, or dizziness may occur. If any of these effects persist or worsen, notify your doctor or pharmacist promptly. Remember that your doctor has prescribed this medication because he or she has judged that the benefit to you is greater than the risk of side effects. Many people using this medication do not have serious side effects. Tell your doctor immediately if any of these unlikely but serious side effects occur: chest pain, fainting, fast/pounding/irregular heartbeat, hearing problems, ringing in the ears, severe headache, mental/mood changes (e.g., agitation, anxiety, confusion, hallucinations), uncontrolled movements (tremor), unusual weight loss or gain. Tell your doctor immediately if any of these rare but very serious side effects occur: muscle pain/tenderness/weakness, change in the amount of urine. This drug may infrequently cause seizures. Seek immediate medical attention if you experience a seizure. If you have a seizure while taking bupropion, you should not take this drug again. A very serious allergic reaction to this drug is unlikely, but seek immediate medical attention if it occurs. Symptoms of a serious allergic reaction include: rash, itching/swelling (especially of the face/tongue/throat),  severe dizziness, trouble breathing. This is not a complete list of possible side effects. If you notice other effects not listed above, contact your doctor or pharmacist. In the [place] - Call your doctor for medical advice about side effects. You may report side effects to FDA at 1-800-FDA-1088. In [place] - Call your doctor for medical advice about side effects. You may report side effects to [facility] [place] at [phone].    PRECAUTIONS:  See also the How to Use and Warning sections. Before taking bupropion, tell your doctor or pharmacist if you are allergic to it; or if you have any other allergies. This product may contain inactive ingredients, which can cause allergic reactions or other problems. Talk to your pharmacist for more details. This medication should not be used if you have certain medical conditions. Before using this medicine, consult your doctor or pharmacist if you have or have had: seizures, eating disorders (e.g., bulimia, anorexia nervosa). This medication should not be used if you are suddenly stopping regular use of sedatives (e.g., benzodiazepines such as lorazepam) or alcohol. Doing so may increase your risk of seizures. Large amounts of alcohol may also increase your risk of seizures and dizziness. Discuss your use of these products with your doctor. Before using this medication, tell your doctor or pharmacist your medical history, especially of: alcohol/drug dependence (including benzodiazepines, narcotic pain medicines, cocaine and stimulants), brain tumor, diabetes, head injury, heart disease (e.g., congestive heart failure, high blood pressure, recent heart attack), kidney problems, liver problems (e.g., cirrhosis), personal  or family history of psychiatric disorder (e.g., bipolar/manic-depressive disorder), personal or family history of suicide thoughts/attempts, intent to quit smoking. Though uncommon, depression can lead to thoughts or attempts of suicide. Tell your doctor  immediately if you have any suicidal thoughts, worsening depression, or any other mental/mood changes (including new or worsening anxiety, agitation, panic attacks, trouble sleeping, irritability, hostile/angry feelings, impulsive actions, severe restlessness, rapid speech, unusual behavior changes). Keep all medical appointments so your doctor can monitor your progress closely and adjust or change your medication if needed. This drug may make you dizzy or affect your coordination. Do not drive, use machinery, or do any activity that requires alertness until you are sure you can perform such activities safely. Avoid or limit alcoholic beverages. Kidney function declines as you grow older. This medication is removed by the kidneys. Therefore, elderly people may be more sensitive to this drug and to side effects. This medication should be used only when clearly needed during pregnancy. Discuss the risks and benefits with your doctor. Infrequently, newborns whose mothers have used certain newer antidepressants during the last 3 months of pregnancy may develop symptoms including persistent feeding or breathing difficulties, jitteriness, seizures or constant crying. Promptly report any such symptoms to the doctor. However, do not stop taking this medication unless your doctor directs you to do so. This drug passes into breast milk and may have undesirable effects on a nursing infant. Consult your doctor before breast-feeding.    DRUG INTERACTIONS:  The effects of some drugs can change if you take other drugs or herbal products at the same time. This can increase your risk for serious side effects or may cause your medications not to work correctly. These drug interactions are possible, but do not always occur. Your doctor or pharmacist can often prevent or manage interactions by changing how you use your medications or by close monitoring. To help your doctor and pharmacist give you the best care, be sure to tell your  doctor and pharmacist about all the products you use (including prescription drugs, nonprescription drugs, and herbal products) before starting treatment with this product. While using this product, do not start, stop, or change the dosage of any other medicines you are using without your doctor's approval. Some products that may interact with this drug include: amantadine, diabetes medications (such as glyburide, glipizide, or insulin), certain x-ray dyes (including iomeprol), levodopa, nicotine products (such as patches, gum, or spray), regular use of sedatives (such as alprazolam), stimulants, warfarin. Avoid taking MAO inhibitors (isocarboxazid, linezolid, moclobemide, phenelzine, procarbazine, rasagiline, selegiline, tranylcypromine) during treatment with this medication and for two weeks before and after treatment. In some cases a serious (possibly fatal) drug interaction may occur. Other medications can affect the removal of bupropion from your body, which may affect how bupropion works. Examples include cyclophosphamide, orphenadrine, thiotepa, antiplatelet drugs (including clopidogrel, ticlodipine), anti-seizure drugs (such as carbamazepine, phenobarbital, phenytoin), HIV drugs (such as ritonavir), rifamycins (such as rifampin), among others. Bupropion can speed up the removal of other drugs from your body, which may affect how they work. Examples of affected drugs include citalopram, antiarrhythmics (such as propafenone, flecainide), antidepressants (such as desipramine, paroxetine, fluoxetine, sertraline), antipsychotics (such as haloperidol, thioridazine), beta-blockers (such as metoprolol), among others. Also report the use of drugs which might increase seizure risk (decrease seizure threshold) when combined with bupropion, such as antipsychotics (e.g., chlorpromazine), tricyclic antidepressants (e.g., amitriptyline), corticosteroids (e.g., prednisone) or theophylline, among others. Consult your doctor  or pharmacist for details. Large amounts of caffeine  and other stimulants, such as those found in weight loss and cold/sinus medications, can increase the chance of seizures with this drug. Check all nonprescription/prescription/herbal drug labels for caffeine and other stimulants (e.g., ephedra). Consult your doctor or pharmacist. This document does not contain all possible drug interactions. Keep a list of all the products you use. Share this list with your doctor and pharmacist to lessen your risk for serious medication problems.    OVERDOSE:  If overdose is suspected, contact your local poison control center or emergency room immediately. [place] residents can call the [place] national poison hotline at (289)221-2088. [place] residents should call their local poison control center directly. Symptoms of overdose may include: seizures, hallucinations, fast or slow heart rate, loss of consciousness.    NOTES:  Do not share this medication with others. Psychiatric/medical checkups or tests such as blood pressure monitoring may be performed periodically to monitor your progress or check for side effects. Consult your doctor for more details.    MISSED DOSE:  If you miss a dose, skip the missed dose and resume your usual dosing schedule. Do not double the dose to catch up.    STORAGE:  Store at room temperature between 59-77 degrees F (15-25 degrees C) away from light and moisture. Bupropion tablets may have a strange odor. This is normal and the medication is still okay to use. Do not store in the bathroom. Keep all medicines away from children and pets. Do not flush medications down the toilet or pour them into a drain unless instructed to do so. Properly discard this product when it is expired or no longer needed. Consult your pharmacist or local waste disposal company for more details about how to safely discard your product.    Information last revised October 2010 Copyright(c) 2010 [employer].

## 2011-01-22 NOTE — Assessment & Plan Note (Signed)
Long term issue and we discussed different aspects that likely play a role. She does want to go to couples counseling as there is some resentment she feels that she knows is impacting her intimacy with her husband.

## 2011-01-22 NOTE — Assessment & Plan Note (Signed)
Unchanged. 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 >25 min spent with patient, at least half of which was spent on counseling her depression and sexual dysfunction. Will start Wellbutrin and wean off Citalopram. Pt to follow up in one month.

## 2011-01-22 NOTE — Progress Notes (Signed)
  Subjective:    Patient ID: [name], female    DOB: 1976/08/21, 35 y.o.   MRN: [id number]  HPI  35 yo here for follow obesity and depression.  Obesity-  She is three months post op Laparoscopic adjustable gastric banding. Has already lost 50 pounds!  Feels great. Wt Readings from Last 3 Encounters:  06/12/10 272 lb 4 oz (123.492 kg)  05/11/10 273 lb (123.832 kg)  04/27/10 279 lb 2.1 oz (126.613 kg)   Mondays and Fridays- water aerobics.  Very active with her family.  Depression- started Citalopram 10 mg daily in 09/2010.  Previously had been taking Lexapro but had decreased sex drive and wanted to try something else on formulary, so we tried Citalopram. Sex drive has not improved.  Citalopram has helped with crying spells but otherwise she feels no benefit.    Review of Systems  Constitutional: Negative for appetite change.  Psychiatric/Behavioral: Negative for suicidal ideas, hallucinations, confusion and decreased concentration.       Objective:   Physical Exam     General:  Well-developed,well-nourished,in no acute distress; alert,appropriate and cooperative throughout examination, morbidly obese. Normotensive Psych:  normally interactive, not anxious or depressed appearing.    Assessment & Plan:

## 2011-01-22 NOTE — Assessment & Plan Note (Signed)
Improved.  Doing well.  Advised to continue the good work!

## 2011-01-31 ENCOUNTER — Encounter: Payer: BC Managed Care – PPO | Attending: General Surgery | Admitting: *Deleted

## 2011-01-31 DIAGNOSIS — Z09 Encounter for follow-up examination after completed treatment for conditions other than malignant neoplasm: Secondary | ICD-10-CM | POA: Insufficient documentation

## 2011-01-31 DIAGNOSIS — Z713 Dietary counseling and surveillance: Secondary | ICD-10-CM | POA: Insufficient documentation

## 2011-01-31 DIAGNOSIS — Z9884 Bariatric surgery status: Secondary | ICD-10-CM | POA: Insufficient documentation

## 2011-02-01 ENCOUNTER — Telehealth: Payer: Self-pay | Admitting: *Deleted

## 2011-02-01 NOTE — Telephone Encounter (Signed)
Yes she can come in to have a TSH, CBC, b12.

## 2011-02-01 NOTE — Telephone Encounter (Signed)
Patient says that she has been having mood swings, feels unhappy at times, feels tired all of the time, has been losing a lot of hair, has night sweats, feels cold all of the time during the day. She is asking if she could come in to have her thyroid checked. Please advise.

## 2011-02-02 ENCOUNTER — Other Ambulatory Visit: Payer: Self-pay | Admitting: Obstetrics and Gynecology

## 2011-02-02 DIAGNOSIS — Z09 Encounter for follow-up examination after completed treatment for conditions other than malignant neoplasm: Secondary | ICD-10-CM

## 2011-02-05 ENCOUNTER — Other Ambulatory Visit: Payer: Self-pay | Admitting: Obstetrics and Gynecology

## 2011-02-05 ENCOUNTER — Ambulatory Visit
Admission: RE | Admit: 2011-02-05 | Discharge: 2011-02-05 | Disposition: A | Discharge status: Home / Self Care | Payer: BC Managed Care – PPO | Source: Ambulatory Visit | Attending: Obstetrics and Gynecology | Admitting: Obstetrics and Gynecology

## 2011-02-05 DIAGNOSIS — Z09 Encounter for follow-up examination after completed treatment for conditions other than malignant neoplasm: Secondary | ICD-10-CM

## 2011-02-05 NOTE — Telephone Encounter (Signed)
Left message for patient to return my call.

## 2011-02-06 NOTE — Telephone Encounter (Signed)
Patient advised. Lab appt. Scheduled.

## 2011-02-07 ENCOUNTER — Other Ambulatory Visit: Payer: Self-pay | Admitting: Family Medicine

## 2011-02-07 ENCOUNTER — Telehealth: Payer: Self-pay | Admitting: *Deleted

## 2011-02-07 DIAGNOSIS — R5381 Other malaise: Secondary | ICD-10-CM

## 2011-02-07 NOTE — Telephone Encounter (Signed)
[name] in Triage advised to send to Dr Elmer Sow in basket for response on 02/08/11 and notify pt. Pt said she will expect call in the morning and pt wants call back on work # [phone]. Pt's cell does not work in her office.

## 2011-02-07 NOTE — Telephone Encounter (Signed)
Patient says that she was switched from Citalopram to Wellbutrin at her last OV.  She says she has been sick for about a week, on antibiotics, but she says the Wellbutrin just doesn't seem to be working.  She doesn't know if she needs a stronger dose or if she should go back to the Citalopram.  She says any time anything stressful happens, she bursts into tears and can hardly hold it together.  She uses [facility] in [place].

## 2011-02-07 NOTE — Telephone Encounter (Signed)
Looks like this is Dr [name] pt -- ? Why send to me since I'm out of office until Monday If you need me to respond - I will try

## 2011-02-08 ENCOUNTER — Other Ambulatory Visit: Payer: Self-pay | Admitting: Family Medicine

## 2011-02-08 MED ORDER — BUPROPION HCL ER (XL) 300 MG PO TB24: 150.0000 mg | ORAL_TABLET | ORAL | Status: DC

## 2011-02-08 NOTE — Telephone Encounter (Signed)
rx sent

## 2011-02-08 NOTE — Telephone Encounter (Signed)
Yes we can definitely increase the dose of wellbutrin.  I will send higher dose to her pharmacy.  It is also ok to take wellbutrin with celexa if we do not get benefit from the increased dose.

## 2011-02-08 NOTE — Telephone Encounter (Signed)
Patient advised as instructed via telephone.  She would like the increase dose called to [facility].

## 2011-02-08 NOTE — Telephone Encounter (Signed)
Left message on voicemail at work for patient to return call. 

## 2011-02-09 ENCOUNTER — Other Ambulatory Visit (INDEPENDENT_AMBULATORY_CARE_PROVIDER_SITE_OTHER): Payer: BC Managed Care – PPO | Admitting: Family Medicine

## 2011-02-09 DIAGNOSIS — R5381 Other malaise: Secondary | ICD-10-CM

## 2011-02-09 LAB — CBC WITH DIFFERENTIAL/PLATELET
Basophils Absolute: 0 10*3/uL (ref 0.0–0.1)
Eosinophils Relative: 4.9 % (ref 0.0–5.0)
HCT: 38.1 % (ref 36.0–46.0)
Hemoglobin: 12.9 g/dL (ref 12.0–15.0)
Lymphocytes Relative: 33.8 % (ref 12.0–46.0)
Lymphs Abs: 2.3 10*3/uL (ref 0.7–4.0)
Monocytes Relative: 8.7 % (ref 3.0–12.0)
Neutro Abs: 3.6 10*3/uL (ref 1.4–7.7)
Neutrophils Relative %: 51.9 % (ref 43.0–77.0)
Platelets: 233 10*3/uL (ref 150.0–400.0)
RBC: 4.49 Mil/uL (ref 3.87–5.11)
RDW: 14.6 % (ref 11.5–14.6)
WBC: 6.9 10*3/uL (ref 4.5–10.5)

## 2011-02-09 LAB — TSH: TSH: 1.16 u[IU]/mL (ref 0.35–5.50)

## 2011-03-09 ENCOUNTER — Telehealth: Payer: Self-pay | Admitting: *Deleted

## 2011-03-09 NOTE — Telephone Encounter (Signed)
Patient called to let Dr. Dayton Martes know that she has been on the higher dose of Wellbutrin for about one month now and she still has episodes of anxiety and crying spells.  She said that Dr. Dayton Martes told her that we would see how she does on Wellbutrin and another medication may have to be added depending on how she is doing.  Please advise.

## 2011-03-09 NOTE — Telephone Encounter (Signed)
Left message on cell phone voicemail for patient to return call. 

## 2011-03-09 NOTE — Telephone Encounter (Signed)
We could add Citalopram to the Wellbutrin. I am fearful to try anything else because they are related to Lexapro which worsened her sex drive. Would she like to be referred to therapy?

## 2011-03-12 NOTE — Telephone Encounter (Signed)
Left message on cell phone voicemail for patient to return call. 

## 2011-03-13 ENCOUNTER — Telehealth: Payer: Self-pay | Admitting: *Deleted

## 2011-03-13 ENCOUNTER — Encounter: Payer: Self-pay | Admitting: *Deleted

## 2011-03-13 MED ORDER — CITALOPRAM HYDROBROMIDE 20 MG PO TABS: 20.0000 mg | ORAL_TABLET | Freq: Every day | ORAL | Status: DC

## 2011-03-13 NOTE — Telephone Encounter (Signed)
Patient called back to let Dr. Dayton Martes know that she would like to start Citalopram to go along with the Wellbutrin.  She is not interested in therapy because she will not have time to go.  Uses [facility].

## 2011-03-13 NOTE — Telephone Encounter (Signed)
Rx sent 

## 2011-03-13 NOTE — Telephone Encounter (Signed)
Patient advised as instructed via telephone. 

## 2011-03-13 NOTE — Telephone Encounter (Signed)
Left message on voicemail at patients job for her to return call.  Will mail a letter as well.

## 2011-03-16 ENCOUNTER — Encounter (INDEPENDENT_AMBULATORY_CARE_PROVIDER_SITE_OTHER): Payer: Self-pay | Admitting: General Surgery

## 2011-03-16 NOTE — Op Note (Signed)
[name], [name]              ACCOUNT NO.:  [id number]   MEDICAL RECORD NO.:  [id number]          PATIENT TYPE:  AMB   LOCATION:  [facility]                         FACILITY:  [facility]   PHYSICIAN:  Thomas A. Cornett, M.D.DATE OF BIRTH:  Apr 22, 1976   DATE OF PROCEDURE:  03/01/2006  DATE OF DISCHARGE:                                 OPERATIVE REPORT   PREOPERATIVE DIAGNOSIS:  Lipoma lower abdomen.   POSTOPERATIVE DIAGNOSIS:  Lipoma measuring 5 x 6 cm to lower abdomen, deep.   PROCEDURE:  Excision of deep lipoma from abdomen.   SURGEON:  Dr. Harriette Bouillon.   ANESTHESIA:  MAC with 37 mL of 0.5% Sensorcaine local.   ESTIMATED BLOOD LOSS:  20 mL.   INDICATIONS FOR PROCEDURE:  The patient is a 35 year old female who has had  a progressively enlarging, painful lipoma just to the right of her umbilicus  under subcutaneous fat.  She has lost some weight recently; it has become  more noticeable and more uncomfortable.  She wished to have it removed.   After obtaining informed consent from the patient and discussion of  procedure, she agreed to proceed.  I discussed the complications of  bleeding, infection, and wound problems to her.  She understood and agreed  to proceed.  Also, I told her that there may be some drainage as well from  the wound.   DESCRIPTION OF PROCEDURE:  The patient was brought to operating room and  placed supine.  After induction of MAC anesthesia, the right lower quadrant  of her abdomen was prepped and draped in a sterile fashion.  Just  approximately 3 cm inferior into the right of the umbilicus, is an area  where the lipoma is.  It roughly measured 2 x 3 cm on exam.  After  infiltration of local anesthesia, an incision was made over it.  I dissected  into the deep fat, was able to enucleate the lipoma and passed it off the  field.  I oversewed 2 small areas of bleeding with 3-0 Vicryl.  I closed the  deep fat with 3-0 Vicryl and the skin with 4-0 Monocryl.   Steri-Strips and  dry dressings were applied.  All final counts of sponge, needle, and  instruments were found to be correct.  The patient was taken to recovery in  satisfactory condition.      Thomas A. Cornett, M.D.  Electronically Signed     TAC/MEDQ  D:  03/01/2006  T:  03/02/2006  Job:  [id number]

## 2011-03-16 NOTE — Op Note (Signed)
   NAME:  [name], [name]                           ACCOUNT NO.:  [id number]   MEDICAL RECORD NO.:  [id number]                   PATIENT TYPE:  AMB   LOCATION:  [facility]                                  FACILITY:  [facility]   PHYSICIAN:  Michelle L. Vincente Poli, M.D.            DATE OF BIRTH:  04/23/1976   DATE OF PROCEDURE:  06/04/2002  DATE OF DISCHARGE:                                 OPERATIVE REPORT   PREOPERATIVE DIAGNOSIS:  1. Intrauterine pregnancy at six weeks.  2. Desires voluntary interruption of pregnancy.   POSTOPERATIVE DIAGNOSES:  1. Intrauterine pregnancy at six weeks.  2. Desires voluntary interruption of pregnancy.   PROCEDURE:  Dilatation and evacuation.   SURGEON:  Michelle L. Jule Economy, M.D.   ANESTHESIA:  MAC with paracervical block.   ESTIMATED BLOOD LOSS:  Less than 50 cc.   DESCRIPTION OF PROCEDURE:  After informed consent was obtained, the patient  was taken to the operating room.  She was given IV sedation and placed in  the lithotomy position.  She was prepped and draped in the usual sterile  fashion.  In-and-out catheter was used to empty the bladder.  The speculum  was inserted into the vagina.  The cervix was grasped with a tenaculum and a  paracervical block was then performed.  The uterus was sounded to 7 cm and  was midpositional.  Using Pratt dilators, the cervical internal os was  gently dilated and a #7 suction cannula was inserted into the uterus.  The  suction curettage was performed x2 with retrieval of contents consistent  with products of conception.  A sharp curette was inserted into the uterine  cavity and the uterus was thoroughly curetted of all tissue.  There was no  bleeding.  At the end of the procedure all the sponge, lap, and instrument  counts were correct x2.  The patient tolerated the procedure well and was  taken to the recovery room in stable condition.                                               Michelle L. Vincente Poli, M.D.    Florestine Avers   D:  06/04/2002  T:  06/06/2002  Job:  [id number]

## 2011-04-11 ENCOUNTER — Other Ambulatory Visit: Payer: Self-pay | Admitting: Family Medicine

## 2011-04-25 ENCOUNTER — Encounter: Payer: BC Managed Care – PPO | Attending: General Surgery | Admitting: *Deleted

## 2011-04-25 DIAGNOSIS — Z9884 Bariatric surgery status: Secondary | ICD-10-CM | POA: Insufficient documentation

## 2011-04-25 DIAGNOSIS — Z713 Dietary counseling and surveillance: Secondary | ICD-10-CM | POA: Insufficient documentation

## 2011-04-25 DIAGNOSIS — Z09 Encounter for follow-up examination after completed treatment for conditions other than malignant neoplasm: Secondary | ICD-10-CM | POA: Insufficient documentation

## 2011-05-03 ENCOUNTER — Ambulatory Visit: Payer: BC Managed Care – PPO | Admitting: *Deleted

## 2011-05-11 ENCOUNTER — Encounter (INDEPENDENT_AMBULATORY_CARE_PROVIDER_SITE_OTHER): Payer: Self-pay | Admitting: General Surgery

## 2011-05-11 ENCOUNTER — Ambulatory Visit (INDEPENDENT_AMBULATORY_CARE_PROVIDER_SITE_OTHER): Payer: BC Managed Care – PPO | Admitting: General Surgery

## 2011-05-11 VITALS — BP 110/68 | Ht 64.5 in | Wt 223.2 lb

## 2011-05-11 DIAGNOSIS — E669 Obesity, unspecified: Secondary | ICD-10-CM

## 2011-05-11 DIAGNOSIS — Z9884 Bariatric surgery status: Secondary | ICD-10-CM

## 2011-05-11 NOTE — Patient Instructions (Signed)
Take Multivitamin & Calcium supplements daily.  Avoid eating & drinking at the same time. Avoid breads & pasta

## 2011-05-14 ENCOUNTER — Encounter (INDEPENDENT_AMBULATORY_CARE_PROVIDER_SITE_OTHER): Payer: Self-pay | Admitting: General Surgery

## 2011-05-14 NOTE — Progress Notes (Signed)
Procedure: Laparoscopic adjustable gastric banding (AP standard (October 17, 2010  History of Present Ilness: 35 year old obese Caucasian female comes in for long-term followup after undergoing a LAP-BAND. She was last seen on January 31, 2011. She missed her appointment in May. Since that time she states that she's been doing fairly well. Her biggest issue is some occasional stress eating. She is going to the [facility] twice a week to do water aerobics. On other days of the week she is walking about 1 mile. She denies any heartburn or reflux. She denies any night time or morning cough. She denies any wheezing. She denies any abdominal pain. She reports having trouble with real thick breads such as biscuits.  Physical Exam: See LAP-BAND flowsheet Well-developed well-nourished obese Caucasian female in no apparent distress Pulmonary-lungs are clear to auscultation Cardiac-regular rate and rhythm Abdomen-soft, nontender and nondistended. Well-healed trocar incisions. Port is in the right upper quadrant. Extremities-no cellulitis or edema  Data reviewed: LAP-BAND flowsheet, previous office note from January 31, 2011  Assessment and Plan: Status post laparoscopic adjustable gastric band placement. We did not do a fill today. The patient has lost a total of 56 pounds since surgery. She's lost 8.2 pounds since her last visit. I strongly encouraged her to make sure that she does not miss any future appointments. I encouraged her to increase her exercise activity. Also offered to send her back to our nutritionist for lap band refresher. However she declined. I reminded her that in order to be successful with the lap band that she must followup on a monthly basis. Also encouraged her to continue with her daily supplements. We will see her back in 4 weeks.

## 2011-06-08 ENCOUNTER — Ambulatory Visit (INDEPENDENT_AMBULATORY_CARE_PROVIDER_SITE_OTHER): Payer: BC Managed Care – PPO | Admitting: Physician Assistant

## 2011-06-08 ENCOUNTER — Encounter (INDEPENDENT_AMBULATORY_CARE_PROVIDER_SITE_OTHER): Payer: Self-pay | Admitting: Physician Assistant

## 2011-06-08 VITALS — BP 140/90 | Ht 64.5 in | Wt 221.2 lb

## 2011-06-08 DIAGNOSIS — Z4651 Encounter for fitting and adjustment of gastric lap band: Secondary | ICD-10-CM

## 2011-06-08 NOTE — Patient Instructions (Signed)
Take clear liquids for the next 48 hours. Thin protein shakes are ok to start on Saturday evening. Call us if you have persistent vomiting or regurgitation, night cough or reflux symptoms. Return as scheduled or sooner if you notice no changes in hunger/portion sizes.   

## 2011-06-08 NOTE — Progress Notes (Signed)
  HISTORY: [name] is a 35 y.o.female who received an AP-Standard lap-band in December 2011 by Dr. Andrey Campanile. She comes today with modest weight loss but complains of increasing appetite and increasing portion sizes. No persistent regurgitation symptoms.  VITAL SIGNS: Filed Vitals:   06/08/11 1225  BP: 140/90    PHYSICAL EXAM: Physical exam reveals a very well-appearing 35 y.o.female in no apparent distress Neurologic: Awake, alert, oriented Psych: Bright affect, conversant Respiratory: Breathing even and unlabored. No stridor or wheezing Abdomen: Soft, nontender, nondistended to palpation. Incisions well-healed. No incisional hernias. Port easily palpated. Extremities: Atraumatic, good range of motion.  ASSESMENT: 35 y.o.  female  s/p AP-Standard lap-band.   PLAN: The patient's port was accessed with a 20G Huber needle without difficulty. Clear fluid was aspirated and 0.5 mL saline was added to the port. The patient was able to swallow water without difficulty following the procedure and was instructed to take clear liquids for the next 24-48 hours and advance slowly as tolerated.

## 2011-07-06 ENCOUNTER — Ambulatory Visit (INDEPENDENT_AMBULATORY_CARE_PROVIDER_SITE_OTHER): Payer: BC Managed Care – PPO | Admitting: General Surgery

## 2011-07-06 ENCOUNTER — Encounter (INDEPENDENT_AMBULATORY_CARE_PROVIDER_SITE_OTHER): Payer: BC Managed Care – PPO

## 2011-07-06 ENCOUNTER — Encounter (INDEPENDENT_AMBULATORY_CARE_PROVIDER_SITE_OTHER): Payer: Self-pay | Admitting: General Surgery

## 2011-07-06 VITALS — BP 124/80 | Ht 64.5 in | Wt 220.0 lb

## 2011-07-06 DIAGNOSIS — E669 Obesity, unspecified: Secondary | ICD-10-CM

## 2011-07-06 DIAGNOSIS — Z4651 Encounter for fitting and adjustment of gastric lap band: Secondary | ICD-10-CM

## 2011-07-06 NOTE — Progress Notes (Signed)
Chief complaint: LapBand fill  Procedure: Status post laparoscopic adjustable gastric band placement October 17, 2010  History of Present Ilness: 35 year old obese Caucasian female comes in for long-term followup. She was last seen on August 10 by our bariatric PA. She did receive an adjustment at that visit. She denies any abdominal pain, regurgitation, vomiting, reflux. She states that she started to feel some restriction now. 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 She states that she hasn't been exercising like she should. At most she is exercising about twice a week. She states that she is not taking her multivitamin or calcium supplement on a regular basis.  Physical Exam: BP 124/80  Ht 5' 4.5" (1.638 m)  Wt 220 lb (99.791 kg)  BMI 37.18 kg/m2  Well-developed well-nourished obese Caucasian female in no apparent distress Pulmonary-lungs are clear to auscultation Cardiac-regular rate and rhythm Abdomen-soft, nontender, nondistended. Port in the right upper quadrant. No signs of incisional hernia. Skin-no jaundice or edema  Data reviewed: I reviewed the note from our bariatric PA from August 10.   Assessment and Plan: Obesity Status post laparoscopic adjustable gastric band placement  I encouraged her to continue to exercise on a more frequent basis. I also encouraged her to take her multivitamin and calcium supplement on a daily basis. To date, the patient has lost approximately 59.6 pounds since surgery.  We did do an adjustment today. I aspirated her port in the right upper quadrant and added 0.25 cc of saline. She initially had a little trouble with sips of water.  She stated that she ate just prior to her appt.  However, at the end of her visit, she was tolerating water without problems.    I called the pt about an hour after she left the office.  She stated she was doing well.  She had drank some tea without any problem.  The  pt was reminded to drink liquids for the next 48hours.  I advised her to call if she developed abdominal pain, reflux, regurgitation.

## 2011-07-06 NOTE — Patient Instructions (Signed)
Drink liquids for the next 48hours.  May start protein shakes on Saturday.  Call for abdominal pain, regurgitation, reflux, or any questions.  Try to exercise more frequently

## 2011-07-19 LAB — URINALYSIS, ROUTINE W REFLEX MICROSCOPIC
Hgb urine dipstick: NEGATIVE
Specific Gravity, Urine: 1.025
Urobilinogen, UA: 0.2

## 2011-07-19 LAB — COMPREHENSIVE METABOLIC PANEL
ALT: 14
AST: 16
Alkaline Phosphatase: 44
CO2: 24
Chloride: 100
Creatinine, Ser: 0.64
GFR calc Af Amer: >60
GFR calc non Af Amer: >60
Sodium: 135
Total Bilirubin: 0.5

## 2011-07-19 LAB — CBC
Hemoglobin: 12.6
MCV: 89.5
RBC: 4.01
WBC: 8.9

## 2011-07-24 ENCOUNTER — Encounter: Payer: BC Managed Care – PPO | Attending: General Surgery | Admitting: *Deleted

## 2011-07-24 ENCOUNTER — Encounter: Payer: Self-pay | Admitting: *Deleted

## 2011-07-24 DIAGNOSIS — Z9884 Bariatric surgery status: Secondary | ICD-10-CM | POA: Insufficient documentation

## 2011-07-24 DIAGNOSIS — Z713 Dietary counseling and surveillance: Secondary | ICD-10-CM | POA: Insufficient documentation

## 2011-07-24 DIAGNOSIS — Z09 Encounter for follow-up examination after completed treatment for conditions other than malignant neoplasm: Secondary | ICD-10-CM | POA: Insufficient documentation

## 2011-07-24 NOTE — Patient Instructions (Addendum)
Goals:  Follow Phase 3B: High Protein + Non-Starchy Vegetables  Eat 3-6 small meals/snacks, every 3-5 hrs  Increase lean protein foods to meet 80g goal  Increase fluid intake to 64oz +  Add 15 grams of carbohydrate (fruit, whole grain, starchy vegetable) with meals  Avoid drinking 15 minutes before, during and 30 minutes after eating  Aim for >30 min of physical activity daily  Follow up with MD on band fill adjustment PRN

## 2011-07-24 NOTE — Progress Notes (Signed)
  Follow-up visit: 9 Months Post-Operative LAGB Surgery  Medical Nutrition Therapy:  Appt start time: 1730 end time:  1800.  Assessment:  Primary concerns today: post-operative bariatric surgery nutrition management. Pt reports today that she is concerned her band may be too tight. She notes frequent regurgitation and has trouble getting down protein rich foods. She may request a band fill removal.  Weight today: 213.4 lb Weight change: 12 lbs Total weight lost: 65.1 lbs BMI: 36.8% Weight goal: 175 lbs  24-hr recall:  B (7-8 AM): Grit (1/2 cup) or SKIPS Snk (10  AM): cheese cube (1 oz)   L (12-1 PM): Deli meat and cheese OR Black beans, chicken, cheese soup (1/2 cup) Snk (3-4 PM): cheese cube  D (7-8 PM): Deli meat w/ cheese OR Chips and salsa (3-4 oz) Snk (8-9 PM): Chips and salsa  Fluid intake: water, unsweet tea = 64oz+ Estimated total protein intake: 40-50g  Medications: See updated medication Supplementation: Pt taking supplements 25-50% of the time  Using straws: No Drinking while eating: Yes, drinks 4 oz-8oz with meals Hair loss: No Carbonated beverages: No N/V/D/C: Yes, related to eating to much and eating too fast (not chewing well) Last Lap-Band fill: Last fill w/ Dr. Andrey Campanile on 07/06/11  Recent physical activity:  Pt goes to the [employer], 2 times/week for 45-60 minutes (Water Aerobics)  Progress Towards Goal(s):  In progress.   Nutritional Diagnosis:  Lookingglass-3.3 Overweight/obesity As related to recent LAGB surgery.  As evidenced by pt following LAGB guidelines for continued weight loss.    Intervention:  Nutrition education.  Monitoring/Evaluation:  Dietary intake, exercise, lap band fills, and body weight. Follow up in 3 months for 12 month post-op visit.

## 2011-07-25 ENCOUNTER — Telehealth (INDEPENDENT_AMBULATORY_CARE_PROVIDER_SITE_OTHER): Payer: Self-pay | Admitting: General Surgery

## 2011-07-25 LAB — CBC
Hemoglobin: 10.2 — ABNORMAL LOW
MCHC: 35
MCV: 88.9
Platelets: 208
Platelets: 233
RBC: 4.22
RDW: 14.3
RDW: 14.8
WBC: 10.9 — ABNORMAL HIGH

## 2011-07-25 LAB — RPR: RPR Ser Ql: NONREACTIVE

## 2011-07-30 NOTE — Telephone Encounter (Signed)
Called patient back after she left a voicemail calling me back. I was checking on patient because we got a note from the nutritionist stating she was having some trouble getting foods down. I told the patient to call me back and ask to speak with me, not my voicemail or we would just speak with the patient on Friday at her appt.

## 2011-07-30 NOTE — Telephone Encounter (Signed)
Spoke with patient re: her visit with the nutritionist. Patient explains that it was not that she was having trouble swallowing nutrition drinks, it was that she doesn't like them. She is able to eat and drink with restriction, but not too much restriction. She has actually cancelled her appt with [place] this Friday due to a conference she has to go to out of town but she will call back to reschedule or if she has any problems.

## 2011-08-03 ENCOUNTER — Encounter (INDEPENDENT_AMBULATORY_CARE_PROVIDER_SITE_OTHER): Payer: BC Managed Care – PPO | Admitting: General Surgery

## 2011-08-28 ENCOUNTER — Other Ambulatory Visit: Payer: Self-pay | Admitting: Obstetrics and Gynecology

## 2011-08-28 DIAGNOSIS — N63 Unspecified lump in unspecified breast: Secondary | ICD-10-CM

## 2011-08-31 ENCOUNTER — Ambulatory Visit (INDEPENDENT_AMBULATORY_CARE_PROVIDER_SITE_OTHER): Payer: BC Managed Care – PPO | Admitting: Physician Assistant

## 2011-08-31 ENCOUNTER — Encounter (INDEPENDENT_AMBULATORY_CARE_PROVIDER_SITE_OTHER): Payer: Self-pay

## 2011-08-31 VITALS — BP 128/88 | HR 68 | Temp 97.0°F | Resp 20 | Ht 64.5 in | Wt 209.2 lb

## 2011-08-31 DIAGNOSIS — Z4651 Encounter for fitting and adjustment of gastric lap band: Secondary | ICD-10-CM

## 2011-08-31 NOTE — Progress Notes (Signed)
  HISTORY: [name] is a 35 y.o.female who received an AP-Standard lap-band in December 2011 by Dr. Andrey Campanile. She says that since her last adjustment she's been having increasing intolerance of solid food and has gotten quite uncomfortable with eating. Fluids do ok. She would like fluid removed.  VITAL SIGNS: Filed Vitals:   08/31/11 1158  BP: 128/88  Pulse: 68  Temp: 97 F (36.1 C)  Resp: 20    PHYSICAL EXAM: Physical exam reveals a very well-appearing 35 y.o.female in no apparent distress Neurologic: Awake, alert, oriented Psych: Bright affect, conversant Respiratory: Breathing even and unlabored. No stridor or wheezing Abdomen: Soft, nontender, nondistended to palpation. Incisions well-healed. No incisional hernias. Port easily palpated. Extremities: Atraumatic, good range of motion.  ASSESMENT: 35 y.o.  female  s/p AP-Standard lap-band.   PLAN: The patient's port was accessed with a 20G Huber needle without difficulty. Clear fluid was aspirated and 0.25 mL saline was removed from the port. She will follow-up with Dr. Andrey Campanile in one month for her annual visit.

## 2011-08-31 NOTE — Patient Instructions (Signed)
Return in 1 month or sooner if needed. 

## 2011-09-07 ENCOUNTER — Ambulatory Visit
Admission: RE | Admit: 2011-09-07 | Discharge: 2011-09-07 | Disposition: A | Discharge status: Home / Self Care | Payer: BC Managed Care – PPO | Source: Ambulatory Visit | Attending: Obstetrics and Gynecology | Admitting: Obstetrics and Gynecology

## 2011-09-07 DIAGNOSIS — N63 Unspecified lump in unspecified breast: Secondary | ICD-10-CM

## 2011-10-12 ENCOUNTER — Encounter (INDEPENDENT_AMBULATORY_CARE_PROVIDER_SITE_OTHER): Payer: BC Managed Care – PPO | Admitting: General Surgery

## 2011-10-19 ENCOUNTER — Encounter (INDEPENDENT_AMBULATORY_CARE_PROVIDER_SITE_OTHER): Payer: BC Managed Care – PPO

## 2011-11-02 ENCOUNTER — Encounter (INDEPENDENT_AMBULATORY_CARE_PROVIDER_SITE_OTHER): Payer: BC Managed Care – PPO

## 2011-11-02 ENCOUNTER — Encounter (INDEPENDENT_AMBULATORY_CARE_PROVIDER_SITE_OTHER): Payer: BC Managed Care – PPO | Admitting: General Surgery

## 2011-12-10 ENCOUNTER — Encounter: Payer: Self-pay | Admitting: Family Medicine

## 2011-12-10 ENCOUNTER — Telehealth: Payer: Self-pay | Admitting: *Deleted

## 2011-12-10 ENCOUNTER — Ambulatory Visit (INDEPENDENT_AMBULATORY_CARE_PROVIDER_SITE_OTHER): Payer: BC Managed Care – PPO | Admitting: Family Medicine

## 2011-12-10 VITALS — BP 130/80 | HR 74 | Temp 98.3°F | Wt 214.8 lb

## 2011-12-10 DIAGNOSIS — J029 Acute pharyngitis, unspecified: Secondary | ICD-10-CM

## 2011-12-10 LAB — POCT RAPID STREP A (OFFICE): Rapid Strep A Screen: NEGATIVE

## 2011-12-10 MED ORDER — MAGIC MOUTHWASH W/LIDOCAINE: 5.0000 mL | Freq: Four times a day (QID) | ORAL | Status: DC | PRN

## 2011-12-10 MED ORDER — EPINEPHRINE 0.3 MG/0.3ML IJ DEVI: 0.3000 mg | Freq: Once | INTRAMUSCULAR | Status: DC

## 2011-12-10 NOTE — Progress Notes (Signed)
SUBJECTIVE: 36 y.o. female with sore throat, myalgias for 2 days. No history of rheumatic fever. Other symptoms: coryza and congestion.  Patient Active Problem List  Diagnoses  . Morbid obesity  . URI  . POISON IVY DERMATITIS  . DIZZINESS  . OTHER MALAISE AND FATIGUE  . EDEMA- LOCALIZED  . CHEST PAIN  . Depression  . Decreased sex drive   Past Medical History  Diagnosis Date  . Bursitis of hip   . Asthma     EXERCISE INDUCED  . Arthritis   . Depression   . Generalized headaches   . Obesity (BMI 30-39.9)   . Irregular menses    Past Surgical History  Procedure Date  . Achilles tendon lengthening 1989  . Feet surgery 1990    INSERTION OF BONE GRAFT IN FEET  . Lipoma excision 02/2006    abdomen  . Laparoscopic gastric banding 10/17/10    Dr Gaynelle Adu; AP Standard   History  Substance Use Topics  . Smoking status: Never Smoker   . Smokeless tobacco: Never Used  . Alcohol Use: No   Family History  Problem Relation Age of Onset  . Heart disease Maternal Grandmother     V. Fib arrest  . Hypertension Mother   . Hyperlipidemia Mother   . Hypertension Father   . Hyperlipidemia Father   . Heart disease Father   . Diabetes Father   . Diabetes Sister   . Hypertension Sister    Allergies  Allergen Reactions  . Augmentin   . Codeine     REACTION: rash   Current Outpatient Prescriptions on File Prior to Visit  Medication Sig Dispense Refill  . ALBUTEROL IN Inhale into the lungs as needed.        Marland Kitchen buPROPion (WELLBUTRIN XL) 300 MG 24 hr tablet TAKE 1 TABLET BY MOUTH EVERY MORNING  90 tablet  1  . Calcium Carbonate-Vit D-Min (CALCIUM 1200 PO) Take by mouth daily.        . citalopram (CELEXA) 20 MG tablet Take 1 tablet (20 mg total) by mouth daily.  30 tablet  11  . Multiple Vitamin (MULTIVITAMIN PO) Take by mouth daily.         The PMH, PSH, Social History, Family History, Medications, and allergies have been reviewed in [facility], and have been updated if  relevant.  OBJECTIVE:  BP 130/80  Pulse 74  Temp(Src) 98.3 F (36.8 C) (Oral)  Wt 214 lb 12 oz (97.41 kg)  LMP 11/30/2011  Appears alert, well appearing, and in no distress. Ears: bilateral TM's and external ear canals normal Oropharynx: erythematous and tonsils hypertrophied with exudate Neck: supple, no significant adenopathy Lungs: clear to auscultation, no wheezes, rales or rhonchi, symmetric air entry Rapid Strep test is negative  ASSESSMENT: Viral pharyngitis  PLAN: Per orders. Gargle, use acetaminophen or other OTC analgesic, and take Rx for magic mouthwash given.  Call if other family members develop similar symptoms. See prn.

## 2011-12-10 NOTE — Patient Instructions (Signed)
Good to see you. This is likely a virus. Use magic mouthwash as needed like we discussed. You can take Ibuprofen to help with the swelling. Call back in 5-7 days if symptoms have not improved.

## 2011-12-10 NOTE — Telephone Encounter (Signed)
Spoke with Delice Bison and advised her as instructed via telephone.

## 2011-12-10 NOTE — Telephone Encounter (Signed)
Ok to use a 1 to 1 ratio- 1 part maalox and 1 part lidocaine.

## 2011-12-10 NOTE — Telephone Encounter (Signed)
They received a prescription today for magic mouthwash for the patient. Please call to clarify the prescription because they are not sure how much Alum or Lidocaine that you want patient to have. Please call Delice Bison at the pharmacy [phone].

## 2012-02-16 ENCOUNTER — Ambulatory Visit: Payer: Self-pay | Admitting: Podiatry

## 2012-02-27 HISTORY — PX: ANKLE GANGLION CYST EXCISION: SHX1148

## 2012-03-06 ENCOUNTER — Ambulatory Visit: Payer: Self-pay | Admitting: Podiatry

## 2012-05-16 ENCOUNTER — Other Ambulatory Visit: Payer: Self-pay | Admitting: *Deleted

## 2012-05-16 MED ORDER — CITALOPRAM HYDROBROMIDE 20 MG PO TABS: 20.0000 mg | ORAL_TABLET | Freq: Every day | ORAL | Status: DC

## 2012-05-16 NOTE — Telephone Encounter (Signed)
Faxed refill request from [facility] in [place] for citalopram 20 mg's , last filled 30 on 12/29/11.  This is no longer on med list.  Please advise.

## 2012-05-16 NOTE — Telephone Encounter (Signed)
Please call to verify she is still taking it. If so, ok to refill with 6 refills.

## 2012-05-16 NOTE — Telephone Encounter (Signed)
Pt says she has been taking citalopram, no longer taking wellbutrin.  Would like refills of citalopram sent to pharmacy.

## 2012-05-19 ENCOUNTER — Other Ambulatory Visit: Payer: Self-pay | Admitting: *Deleted

## 2012-05-19 NOTE — Telephone Encounter (Signed)
Medicine called to [facility] in [place].

## 2012-08-06 ENCOUNTER — Encounter: Payer: Self-pay | Admitting: Family Medicine

## 2012-08-06 ENCOUNTER — Ambulatory Visit (INDEPENDENT_AMBULATORY_CARE_PROVIDER_SITE_OTHER): Payer: BC Managed Care – PPO | Admitting: Family Medicine

## 2012-08-06 VITALS — BP 120/60 | HR 76 | Temp 98.1°F | Wt 214.0 lb

## 2012-08-06 DIAGNOSIS — J069 Acute upper respiratory infection, unspecified: Secondary | ICD-10-CM

## 2012-08-06 DIAGNOSIS — J029 Acute pharyngitis, unspecified: Secondary | ICD-10-CM

## 2012-08-06 MED ORDER — HYDROCODONE-HOMATROPINE 5-1.5 MG/5ML PO SYRP: ORAL_SOLUTION | ORAL | Status: DC

## 2012-08-06 NOTE — Progress Notes (Signed)
Patient Name: [name] Date of Birth: 02/12/1976 Medical Record Number: [id number]  History of Present Illness:  Patent presents with runny nose, sneezing, cough, sore throat, malaise and minimal / low-grade fever .  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 Missed four days of school already.  9/20 sick again. Has been sick 3-4 times since 05/2012.  Throat is killing her. Started to feel bad yesterday. Strep test was pos a few weeks ago and completed a course of antibiotics.   Coughing a lot and trouble sleeping  + recent exposure to others with similar symptoms. School  Patient Active Problem List  Diagnosis  . Morbid obesity  . URI  . POISON IVY DERMATITIS  . DIZZINESS  . OTHER MALAISE AND FATIGUE  . EDEMA- LOCALIZED  . CHEST PAIN  . Depression  . Decreased sex drive    Past Medical History  Diagnosis Date  . Bursitis of hip   . Asthma     EXERCISE INDUCED  . Arthritis   . Depression   . Generalized headaches   . Obesity (BMI 30-39.9)   . Irregular menses     Past Surgical History  Procedure Date  . Achilles tendon lengthening 1989  . Feet surgery 1990    INSERTION OF BONE GRAFT IN FEET  . Lipoma excision 02/2006    abdomen  . Laparoscopic gastric banding 10/17/10    Dr Gaynelle Adu; AP Standard    History  Substance Use Topics  . Smoking status: Never Smoker   . Smokeless tobacco: Never Used  . Alcohol Use: No    Family History  Problem Relation Age of Onset  . Heart disease Maternal Grandmother     V. Fib arrest  . Hypertension Mother   . Hyperlipidemia Mother   . Hypertension Father   . Hyperlipidemia Father   . Heart disease Father   . Diabetes Father   . Diabetes Sister   . Hypertension Sister     Allergies  Allergen Reactions  . Amoxicillin-Pot Clavulanate   . Codeine     REACTION: rash    Current Outpatient Prescriptions on File Prior to Visit  Medication Sig Dispense Refill  . ALBUTEROL  IN Inhale into the lungs as needed.        . Calcium Carbonate-Vit D-Min (CALCIUM 1200 PO) Take by mouth daily.        . citalopram (CELEXA) 20 MG tablet Take 1 tablet (20 mg total) by mouth daily.  90 tablet  1  . EPINEPHrine (EPIPEN) 0.3 mg/0.3 mL DEVI Inject 0.3 mLs (0.3 mg total) into the muscle once.  1 Device  0  . Multiple Vitamin (MULTIVITAMIN PO) Take by mouth daily.           Review of Systems: as above, eating and drinking - tolerating PO. Urinating normally. No excessive vomitting or diarrhea. O/w as above.  Physical Exam:  Filed Vitals:   08/06/12 1250  BP: 120/60  Pulse: 76  Temp: 98.1 F (36.7 C)  Weight: 214 lb (97.07 kg)    GEN: WDWN, Non-toxic, Atraumatic, normocephalic. A and O x 3. HEENT: Oropharynx clear without exudate, MMM, no significant LAD, mild rhinnorhea Ears: TM clear, COL visualized with good landmarks CV: RRR, no m/g/r. Pulm: CTA B, no wheezes, rhonchi, or crackles, normal respiratory effort. EXT: no c/c/e Psych: well oriented, neither depressed nor anxious in appearance  A/P: 1. URI. Supportive care reviewed with patient. See patient instruction section. 2 day  history Hycodan

## 2012-10-07 ENCOUNTER — Encounter: Payer: Self-pay | Admitting: Family Medicine

## 2012-10-07 ENCOUNTER — Ambulatory Visit (INDEPENDENT_AMBULATORY_CARE_PROVIDER_SITE_OTHER): Payer: BC Managed Care – PPO | Admitting: Family Medicine

## 2012-10-07 VITALS — BP 130/80 | HR 76 | Temp 98.0°F | Wt 209.0 lb

## 2012-10-07 DIAGNOSIS — D649 Anemia, unspecified: Secondary | ICD-10-CM

## 2012-10-07 DIAGNOSIS — J069 Acute upper respiratory infection, unspecified: Secondary | ICD-10-CM

## 2012-10-07 DIAGNOSIS — E559 Vitamin D deficiency, unspecified: Secondary | ICD-10-CM

## 2012-10-07 LAB — CBC WITH DIFFERENTIAL/PLATELET
Basophils Relative: 0.6 % (ref 0.0–3.0)
Eosinophils Absolute: 0.2 10*3/uL (ref 0.0–0.7)
Eosinophils Relative: 5.2 % — ABNORMAL HIGH (ref 0.0–5.0)
HCT: 29.1 % — ABNORMAL LOW (ref 36.0–46.0)
Lymphocytes Relative: 39.2 % (ref 12.0–46.0)
MCV: 68.3 fl — ABNORMAL LOW (ref 78.0–100.0)
Monocytes Absolute: 0.3 10*3/uL (ref 0.1–1.0)
Neutrophils Relative %: 47.8 % (ref 43.0–77.0)
Platelets: 334 10*3/uL (ref 150.0–400.0)
RBC: 4.25 Mil/uL (ref 3.87–5.11)
RDW: 16.7 % — ABNORMAL HIGH (ref 11.5–14.6)
WBC: 4.6 10*3/uL (ref 4.5–10.5)

## 2012-10-07 LAB — VITAMIN B12: Vitamin B-12: 379 pg/mL (ref 211–911)

## 2012-10-07 NOTE — Patient Instructions (Addendum)
Good to see you. This is likely viral.  Let's check some blood work today to make sure you're not anemic or vitamin deficient. We will call you with your lab results.  Continue cough medicine, supportive care.

## 2012-10-07 NOTE — Progress Notes (Signed)
Very pleasant 36 yo female here for:    Scratchy throat, bilateral ear pain, chills, subjective fever for past 3 days.  Productive cough. Vomiting phlegm every morning.  Feels like she is getting a little better.  No CP or SOB.  Recurrent URI symptoms.  States she has been sick multiple times since August.  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    Had flu shot  Has lost 93 pounds since gastric surgery.  Not taking her vitamins as directed.   Patient Active Problem List  Diagnosis  . Morbid obesity  . URI  . POISON IVY DERMATITIS  . DIZZINESS  . OTHER MALAISE AND FATIGUE  . EDEMA- LOCALIZED  . CHEST PAIN  . Depression  . Decreased sex drive    Past Medical History  Diagnosis Date  . Bursitis of hip   . Asthma     EXERCISE INDUCED  . Arthritis   . Depression   . Generalized headaches   . Obesity (BMI 30-39.9)   . Irregular menses     Past Surgical History  Procedure Date  . Achilles tendon lengthening 1989  . Feet surgery 1990    INSERTION OF BONE GRAFT IN FEET  . Lipoma excision 02/2006    abdomen  . Laparoscopic gastric banding 10/17/10    Dr Gaynelle Adu; AP Standard    History  Substance Use Topics  . Smoking status: Never Smoker   . Smokeless tobacco: Never Used  . Alcohol Use: No    Family History  Problem Relation Age of Onset  . Heart disease Maternal Grandmother     V. Fib arrest  . Hypertension Mother   . Hyperlipidemia Mother   . Hypertension Father   . Hyperlipidemia Father   . Heart disease Father   . Diabetes Father   . Diabetes Sister   . Hypertension Sister     Allergies  Allergen Reactions  . Amoxicillin-Pot Clavulanate   . Codeine     REACTION: rash    Current Outpatient Prescriptions on File Prior to Visit  Medication Sig Dispense Refill  . ALBUTEROL IN Inhale into the lungs as needed.        . Calcium Carbonate-Vit D-Min (CALCIUM 1200 PO) Take by mouth daily.        .  citalopram (CELEXA) 20 MG tablet Take 1 tablet (20 mg total) by mouth daily.  90 tablet  1  . EPINEPHrine (EPIPEN) 0.3 mg/0.3 mL DEVI Inject 0.3 mLs (0.3 mg total) into the muscle once.  1 Device  0  . HYDROcodone-homatropine (HYCODAN) 5-1.5 MG/5ML syrup 1 tsp po at night before bed prn cough  240 mL  0  . Multiple Vitamin (MULTIVITAMIN PO) Take by mouth daily.           Review of Systems: as above, eating and drinking - tolerating PO. Urinating normally. No excessive vomitting or diarrhea. O/w as above.  Physical Exam:  BP 130/80  Pulse 76  Temp 98 F (36.7 C)  Wt 209 lb (94.802 kg)   GEN: WDWN, Non-toxic, Atraumatic, normocephalic. A and O x 3. HEENT: Oropharynx clear without exudate, MMM, no significant LAD, mild rhinnorhea Ears: TM clear, COL visualized with good landmarks CV: RRR, no m/g/r. Pulm: CTA B, no wheezes, rhonchi, or crackles, normal respiratory effort. EXT: no c/c/e Psych: well oriented, neither depressed nor anxious in appearance  A/P:   1. URI- Symptomatic therapy suggested: push fluids, rest  and return office visit prn if symptoms persist or worsen. Lack of antibiotic effectiveness discussed with her. Call or return to clinic prn if these symptoms worsen or fail to improve as anticipated. Will check labs today to rule out anemia or vitamin deficiency given recurrent infections s/p gastric surgery.   The patient indicates understanding of these issues and agrees with the plan. Orders Placed This Encounter  Procedures  . CBC with Differential  . Vitamin B12  . Vitamin D, 25-hydroxy

## 2012-10-08 LAB — VITAMIN D 25 HYDROXY (VIT D DEFICIENCY, FRACTURES): Vit D, 25-Hydroxy: 33 ng/mL (ref 30–89)

## 2012-10-09 ENCOUNTER — Other Ambulatory Visit: Payer: Self-pay | Admitting: Family Medicine

## 2012-10-09 DIAGNOSIS — D649 Anemia, unspecified: Secondary | ICD-10-CM

## 2012-10-13 ENCOUNTER — Other Ambulatory Visit: Payer: Self-pay | Admitting: Obstetrics and Gynecology

## 2012-10-13 DIAGNOSIS — Z1231 Encounter for screening mammogram for malignant neoplasm of breast: Secondary | ICD-10-CM

## 2012-10-16 ENCOUNTER — Encounter (INDEPENDENT_AMBULATORY_CARE_PROVIDER_SITE_OTHER): Payer: Self-pay

## 2012-10-16 ENCOUNTER — Ambulatory Visit (INDEPENDENT_AMBULATORY_CARE_PROVIDER_SITE_OTHER): Payer: BC Managed Care – PPO | Admitting: Physician Assistant

## 2012-10-16 ENCOUNTER — Ambulatory Visit
Admission: RE | Admit: 2012-10-16 | Discharge: 2012-10-16 | Disposition: A | Discharge status: Home / Self Care | Payer: BC Managed Care – PPO | Source: Ambulatory Visit | Attending: Obstetrics and Gynecology | Admitting: Obstetrics and Gynecology

## 2012-10-16 VITALS — BP 124/74 | HR 108 | Ht 64.5 in | Wt 209.8 lb

## 2012-10-16 DIAGNOSIS — Z1231 Encounter for screening mammogram for malignant neoplasm of breast: Secondary | ICD-10-CM

## 2012-10-16 DIAGNOSIS — Z4651 Encounter for fitting and adjustment of gastric lap band: Secondary | ICD-10-CM

## 2012-10-16 NOTE — Progress Notes (Signed)
  HISTORY: [name] is a 36 y.o.female who received an AP-Standard lap-band in December 2011 by Dr. Andrey Campanile. She comes in with persistent solid food dysphagia since August. She's also been diagnosed as anemic and is scheduled to see Hematology. She is concerned about adequate intake and nutrition. She would like some fluid removed to help with these symptoms.  VITAL SIGNS: Filed Vitals:   10/16/12 0908  BP: 124/74  Pulse: 108    PHYSICAL EXAM: Physical exam reveals a very well-appearing 36 y.o.female in no apparent distress Neurologic: Awake, alert, oriented Psych: Bright affect, conversant Respiratory: Breathing even and unlabored. No stridor or wheezing Abdomen: Soft, nontender, nondistended to palpation. Incisions well-healed. No incisional hernias. Port easily palpated. Extremities: Atraumatic, good range of motion.  ASSESMENT: 36 y.o.  female  s/p AP-Standard lap-band.   PLAN: The patient's port was accessed with a 20G Huber needle without difficulty. Clear fluid was aspirated and 1 mL saline was removed from the port. She felt an immediate difference with water. I asked that she return in one month without fail as with a reduction in fill volume there's a chance that she'll have an increase in weight. She voiced understanding and agreement.

## 2012-10-16 NOTE — Patient Instructions (Signed)
Return in one month. Focus on good food choices as well as physical activity. Return sooner if you have an increase in hunger, portion sizes or weight. Return also for difficulty swallowing, night cough, reflux.   

## 2012-10-20 ENCOUNTER — Ambulatory Visit: Payer: Self-pay | Admitting: Hematology and Oncology

## 2012-10-20 LAB — CBC CANCER CENTER
Eosinophil #: 0.3 x10 3/mm (ref 0.0–0.7)
Eosinophil %: 7.3 %
HGB: 8.9 g/dL — ABNORMAL LOW (ref 12.0–16.0)
Lymphocyte #: 1.4 x10 3/mm (ref 1.0–3.6)
Lymphocyte %: 36.6 %
MCHC: 31.3 g/dL — ABNORMAL LOW (ref 32.0–36.0)
MCV: 66 fL — ABNORMAL LOW (ref 80–100)
Monocyte #: 0.3 x10 3/mm (ref 0.2–0.9)
Neutrophil %: 45 %
RDW: 16.2 % — ABNORMAL HIGH (ref 11.5–14.5)

## 2012-10-20 LAB — IRON AND TIBC
Iron Bind.Cap.(Total): 471 ug/dL — ABNORMAL HIGH (ref 250–450)
Iron Saturation: 4 %
Iron: 21 ug/dL — ABNORMAL LOW (ref 50–170)

## 2012-10-20 LAB — TSH: Thyroid Stimulating Horm: 1.21 u[IU]/mL

## 2012-10-29 ENCOUNTER — Ambulatory Visit: Payer: Self-pay | Admitting: Hematology and Oncology

## 2012-11-07 ENCOUNTER — Telehealth: Payer: Self-pay

## 2012-11-07 DIAGNOSIS — N920 Excessive and frequent menstruation with regular cycle: Secondary | ICD-10-CM

## 2012-11-07 NOTE — Telephone Encounter (Signed)
Pt has seen GYN for extreme blood loss during menstrual period and has cyst on both ovaries; pt scheduled for D&C and ablation on 11/19/12. Pt wants second opinion by GYN before surgery. Pt request GYN referral by [facility] office ASAP.pt request call back.

## 2012-11-07 NOTE — Telephone Encounter (Signed)
[name], please call pt to get more info.  I will place referral in Epic. Thank you.

## 2012-11-13 ENCOUNTER — Encounter: Payer: Self-pay | Admitting: Obstetrics & Gynecology

## 2012-11-13 ENCOUNTER — Ambulatory Visit (INDEPENDENT_AMBULATORY_CARE_PROVIDER_SITE_OTHER): Payer: BC Managed Care – PPO | Admitting: Obstetrics & Gynecology

## 2012-11-13 ENCOUNTER — Encounter (INDEPENDENT_AMBULATORY_CARE_PROVIDER_SITE_OTHER): Payer: BC Managed Care – PPO

## 2012-11-13 VITALS — BP 128/80 | HR 71 | Ht 64.0 in | Wt 216.0 lb

## 2012-11-13 DIAGNOSIS — N83209 Unspecified ovarian cyst, unspecified side: Secondary | ICD-10-CM | POA: Insufficient documentation

## 2012-11-13 DIAGNOSIS — N83201 Unspecified ovarian cyst, right side: Secondary | ICD-10-CM

## 2012-11-13 DIAGNOSIS — N920 Excessive and frequent menstruation with regular cycle: Secondary | ICD-10-CM

## 2012-11-13 HISTORY — DX: Unspecified ovarian cyst, right side: N83.201

## 2012-11-13 HISTORY — DX: Unspecified ovarian cyst, unspecified side: N83.209

## 2012-11-13 NOTE — Progress Notes (Signed)
Patient is here today for a 2nd opinion. She has been evaluated by Dr. Vincente Poli for long history of DUB, symptomatic anemia and bilateral symptomatic ovarian cysts. Patient also has a history of cervical dysplasia and is concerned about cervical cancer.  Given her clinical scenario, Dr. Vincente Poli offered her options of oral Provera, Depo Provera, Mirena IUD, endometrial ablation (Novasure/Hydrothermal Ablation/Thermachoice) or hysterectomy as definitive surgical management.  She also discussed risks and benefits of each method.  Dr. Vincente Poli recommended hysterectomy especially given her symptoms and her anxiety about her cervical dysplasia.  Today, I have no notes or studies done by Dr. Vincente Poli. However, the patient provided a lot of information. Based on the information provided, I told her that a hysterectomy might be the best option for her, as recommended by Dr. Vincente Poli.  Patient is comfortable with this opinion and will proceed with laparoscopic hysterectomy with Dr. Vincente Poli. She was told to come back to our clinic for further gynecologic care after her surgery, if she desires.   Total encounter time: 20 minutes

## 2012-11-13 NOTE — Patient Instructions (Signed)
Return to clinic for any scheduled appointments or for any gynecologic concerns as needed.   

## 2012-11-21 LAB — FERRITIN: Ferritin (ARMC): 44 ng/mL (ref 8–388)

## 2012-11-21 LAB — CBC CANCER CENTER
HGB: 13 g/dL (ref 12.0–16.0)
Lymphocyte #: 1.8 x10 3/mm (ref 1.0–3.6)
MCV: 79 fL — ABNORMAL LOW (ref 80–100)
Monocyte %: 8.3 %
Neutrophil #: 2.7 x10 3/mm (ref 1.4–6.5)
Neutrophil %: 48.7 %
RBC: 5.07 10*6/uL (ref 3.80–5.20)
RDW: 29.7 % — ABNORMAL HIGH (ref 11.5–14.5)
WBC: 5.5 x10 3/mm (ref 3.6–11.0)

## 2012-11-21 LAB — IRON AND TIBC
Iron: 79 ug/dL (ref 50–170)
Unbound Iron-Bind.Cap.: 281 ug/dL

## 2012-11-29 ENCOUNTER — Ambulatory Visit: Payer: Self-pay | Admitting: Hematology and Oncology

## 2012-12-04 ENCOUNTER — Other Ambulatory Visit: Payer: Self-pay | Admitting: Obstetrics and Gynecology

## 2012-12-18 ENCOUNTER — Encounter (INDEPENDENT_AMBULATORY_CARE_PROVIDER_SITE_OTHER): Payer: BC Managed Care – PPO

## 2012-12-18 ENCOUNTER — Other Ambulatory Visit: Payer: Self-pay | Admitting: Obstetrics and Gynecology

## 2012-12-27 ENCOUNTER — Ambulatory Visit: Payer: Self-pay | Admitting: Hematology and Oncology

## 2013-01-20 LAB — CBC CANCER CENTER
Basophil #: 0.1 x10 3/mm (ref 0.0–0.1)
Eosinophil #: 0.7 x10 3/mm (ref 0.0–0.7)
Eosinophil %: 10.9 %
HGB: 14 g/dL (ref 12.0–16.0)
Lymphocyte #: 1.9 x10 3/mm (ref 1.0–3.6)
MCHC: 33.9 g/dL (ref 32.0–36.0)
Monocyte #: 0.6 x10 3/mm (ref 0.2–0.9)
Monocyte %: 8.4 %
Neutrophil #: 3.4 x10 3/mm (ref 1.4–6.5)
Platelet: 232 x10 3/mm (ref 150–440)
RDW: 18.6 % — ABNORMAL HIGH (ref 11.5–14.5)
WBC: 6.6 x10 3/mm (ref 3.6–11.0)

## 2013-01-20 LAB — IRON AND TIBC
Iron Bind.Cap.(Total): 401 ug/dL (ref 250–450)
Iron Saturation: 16 %
Iron: 64 ug/dL (ref 50–170)
Unbound Iron-Bind.Cap.: 337 ug/dL

## 2013-01-20 LAB — RETICULOCYTES
Absolute Retic Count: 0.0652 10*6/uL
Reticulocyte: 1.32 %

## 2013-01-20 LAB — FERRITIN: Ferritin (ARMC): 13 ng/mL (ref 8–388)

## 2013-01-27 ENCOUNTER — Ambulatory Visit: Payer: Self-pay | Admitting: Hematology and Oncology

## 2013-02-24 LAB — IRON AND TIBC
Iron Saturation: 51 %
Unbound Iron-Bind.Cap.: 190 ug/dL

## 2013-02-24 LAB — CBC CANCER CENTER
Basophil #: 0.1 x10 3/mm (ref 0.0–0.1)
Eosinophil #: 0.5 x10 3/mm (ref 0.0–0.7)
HCT: 40.4 % (ref 35.0–47.0)
Lymphocyte #: 1.7 x10 3/mm (ref 1.0–3.6)
MCH: 28.7 pg (ref 26.0–34.0)
MCHC: 33.6 g/dL (ref 32.0–36.0)
MCV: 85 fL (ref 80–100)
Monocyte %: 7.1 %
Neutrophil #: 2.9 x10 3/mm (ref 1.4–6.5)
Platelet: 230 x10 3/mm (ref 150–440)
RDW: 13.4 % (ref 11.5–14.5)

## 2013-02-24 LAB — FERRITIN: Ferritin (ARMC): 23 ng/mL (ref 8–388)

## 2013-02-25 ENCOUNTER — Encounter: Payer: Self-pay | Admitting: Family Medicine

## 2013-02-25 ENCOUNTER — Ambulatory Visit (INDEPENDENT_AMBULATORY_CARE_PROVIDER_SITE_OTHER): Payer: BC Managed Care – PPO | Admitting: Family Medicine

## 2013-02-25 VITALS — BP 120/80 | HR 65 | Temp 98.2°F | Wt 233.0 lb

## 2013-02-25 DIAGNOSIS — R5381 Other malaise: Secondary | ICD-10-CM

## 2013-02-25 DIAGNOSIS — R5383 Other fatigue: Secondary | ICD-10-CM

## 2013-02-25 LAB — COMPREHENSIVE METABOLIC PANEL
ALT: 15 U/L (ref 0–35)
AST: 17 U/L (ref 0–37)
Albumin: 3.9 g/dL (ref 3.5–5.2)
Alkaline Phosphatase: 67 U/L (ref 39–117)
BUN: 10 mg/dL (ref 6–23)
Chloride: 103 mEq/L (ref 96–112)
Potassium: 4 mEq/L (ref 3.5–5.1)
Sodium: 136 mEq/L (ref 135–145)
Total Protein: 7.1 g/dL (ref 6.0–8.3)

## 2013-02-25 LAB — HEMOGLOBIN A1C: Hgb A1c MFr Bld: 5.4 % (ref 4.6–6.5)

## 2013-02-25 LAB — TSH: TSH: 0.5 u[IU]/mL (ref 0.35–5.50)

## 2013-02-25 MED ORDER — CITALOPRAM HYDROBROMIDE 20 MG PO TABS: 20.0000 mg | ORAL_TABLET | Freq: Every day | ORAL | Status: DC

## 2013-02-25 NOTE — Patient Instructions (Addendum)
Great to see you. We will call you with your lab work.

## 2013-02-25 NOTE — Progress Notes (Signed)
Subjective:    Patient ID: [name], female    DOB: 08-Sep-1976, 37 y.o.   MRN: [id number]  HPI  37 yo pleasant female here with her sister for several months of fatigue.  H/o anemia and menorrhagia- has received several IV iron infusions.  S/p hysterectomy in 11/2012.  Fatigue has improved since then but still persistent. Thought she was anemic, so called her hematologist yesterday and labs were drawn.  Per pt, she was called and told all labs normal, Hgb 13.6.  Remains on oral iron.  Remote h/o lap band.  Did have band loosened for hysterectomy.  Has gained 30 pounds.  Feels she is sleeping ok and does not feel depressed.  No CP, SOB or DOE. No blood in stool. No n/v/d.  Does feel cold all the time.  +FH of hypothyroidism and DM.  Patient Active Problem List   Diagnosis Date Noted  . Menorrhagia 11/13/2012  . Bilateral ovarian cysts 11/13/2012  . Depression 01/22/2011  . Decreased sex drive 78/29/5621  . Morbid obesity 02/13/2010  . DIZZINESS 12/12/2009  . OTHER MALAISE AND FATIGUE 12/20/2008  . EDEMA- LOCALIZED 12/16/2008  . CHEST PAIN 12/16/2008  . 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 URI 08/27/2007   Past Medical History  Diagnosis Date  . Bursitis of hip   . Asthma     EXERCISE INDUCED  . Arthritis   . Depression   . Generalized headaches   . Obesity (BMI 30-39.9)   . Irregular menses   . Anemia    Past Surgical History  Procedure Laterality Date  . Achilles tendon lengthening  1989  . Feet surgery  1990    INSERTION OF BONE GRAFT IN FEET  . Lipoma excision  02/2006    abdomen  . Laparoscopic gastric banding  10/17/10    Dr Gaynelle Adu; AP Standard  . Ankle ganglion cyst excision  02/2012    left ankle   History  Substance Use Topics  . Smoking status: Never Smoker   . Smokeless tobacco: Never Used  . Alcohol Use: No   Family History  Problem Relation Age of Onset  . Heart disease Maternal Grandmother     V. Fib arrest  . Hypertension  Mother   . Hyperlipidemia Mother   . Hypertension Father   . Hyperlipidemia Father   . Heart disease Father   . Diabetes Father   . Cancer Father     skin  . Diabetes Sister   . Hypertension Sister   . Cancer Maternal Grandfather     lung and skin  . Cancer Paternal Grandfather     stomach and skin   Allergies  Allergen Reactions  . Augmentin (Amoxicillin-Pot Clavulanate)   . Codeine     REACTION: rash   Current Outpatient Prescriptions on File Prior to Visit  Medication Sig Dispense Refill  . ALBUTEROL IN Inhale into the lungs as needed.        . Calcium Carbonate-Vit D-Min (CALCIUM 1200 PO) Take by mouth daily.        Marland Kitchen EPINEPHrine (EPIPEN) 0.3 mg/0.3 mL DEVI Inject 0.3 mLs (0.3 mg total) into the muscle once.  1 Device  0  . Multiple Vitamin (MULTIVITAMIN PO) Take by mouth daily.         No current facility-administered medications on file prior to visit.   The PMH, PSH, Social History, Family History, Medications, and allergies have been reviewed in [place], and have been updated if relevant.  Review of Systems See HPI    Objective:   Physical Exam BP 120/80  Pulse 65  Temp(Src) 98.2 F (36.8 C)  Wt 233 lb (105.688 kg)  BMI 39.97 kg/m2  LMP 11/07/2012  General:  Well-developed,well-nourished,in no acute distress; alert,appropriate and cooperative throughout examination Head:  normocephalic and atraumatic.   Eyes:  vision grossly intact, pupils equal, pupils round, and pupils reactive to light.   Ears:  R ear normal and L ear normal.   Nose:  no external deformity.   Mouth:  good dentition.   Neck:  No deformities, masses, or tenderness noted. Lungs:  Normal respiratory effort, chest expands symmetrically. Lungs are clear to auscultation, no crackles or wheezes. Heart:  Normal rate and regular rhythm. S1 and S2 normal without gallop, murmur, click, rub or other extra sounds. Abdomen:  Bowel sounds positive,abdomen soft and non-tender without masses, organomegaly  or hernias noted. Neurologic:  alert & oriented X3 and gait normal.   Skin:  Intact without suspicious lesions or rashes Psych:  Cognition and judgment appear intact. Alert and cooperative with normal attention span and concentration. No apparent delusions, illusions, hallucinations    Assessment & Plan:  1. Other malaise and fatigue Likely multifactorial- increased weight gain with deconditioning post op. Will not repeat iron studies since done at hematologist (awaiting records). I would like to check other labs today to rule out other possible factors. Exam reassuring. The patient indicates understanding of these issues and agrees with the plan.  - Comprehensive metabolic panel - Hemoglobin A1c - TSH - T4, Free - Vitamin B12 - Vitamin D, 25-hydroxy

## 2013-02-26 ENCOUNTER — Ambulatory Visit: Payer: Self-pay | Admitting: Hematology and Oncology

## 2013-02-26 LAB — VITAMIN D 25 HYDROXY (VIT D DEFICIENCY, FRACTURES): Vit D, 25-Hydroxy: 25 ng/mL — ABNORMAL LOW (ref 30–89)

## 2013-02-27 ENCOUNTER — Other Ambulatory Visit: Payer: Self-pay | Admitting: *Deleted

## 2013-02-27 MED ORDER — VITAMIN D (ERGOCALCIFEROL) 1.25 MG (50000 UNIT) PO CAPS: ORAL_CAPSULE | ORAL | Status: DC

## 2013-03-18 ENCOUNTER — Ambulatory Visit (INDEPENDENT_AMBULATORY_CARE_PROVIDER_SITE_OTHER): Payer: BC Managed Care – PPO | Admitting: Family Medicine

## 2013-03-18 ENCOUNTER — Telehealth: Payer: Self-pay | Admitting: Family Medicine

## 2013-03-18 ENCOUNTER — Encounter: Payer: Self-pay | Admitting: Family Medicine

## 2013-03-18 VITALS — BP 102/80 | HR 80 | Temp 98.4°F | Wt 234.0 lb

## 2013-03-18 DIAGNOSIS — J069 Acute upper respiratory infection, unspecified: Secondary | ICD-10-CM

## 2013-03-18 NOTE — Telephone Encounter (Signed)
Advised patient to come in 

## 2013-03-18 NOTE — Patient Instructions (Addendum)
Good to see you. This is likely a virus.  Viral Pharyngitis Viral pharyngitis is a viral infection that produces redness, pain, and swelling (inflammation) of the throat. It can spread from person to person (contagious). CAUSES Viral pharyngitis is caused by inhaling a large amount of certain germs called viruses. Many different viruses cause viral pharyngitis. SYMPTOMS Symptoms of viral pharyngitis include:  Sore throat.  Tiredness.  Stuffy nose.  Low-grade fever.  Congestion.  Cough. HOME CARE INSTRUCTIONS   Drink enough fluids to keep your urine clear or pale yellow.  Eat soft, cold foods such as ice cream, frozen ice pops, or gelatin dessert.  Gargle with warm salt water (1 tsp salt per 1 qt of water).  If over age 48, throat lozenges may be used safely.  Only take over-the-counter or prescription medicines for pain, discomfort, or fever as directed by your caregiver. Do not take aspirin. To help prevent spreading viral pharyngitis to others, avoid:  Mouth-to-mouth contact with others.  Sharing utensils for eating and drinking.  Coughing around others. SEEK MEDICAL CARE IF:   You are better in a few days, then become worse.  You have a fever or pain not helped by pain medicines.  There are any other changes that concern you. Document Released: 07/25/2005 Document Revised: 01/07/2012 Document Reviewed: 12/21/2010 [employer] Patient Information 2014 [place], [place].

## 2013-03-18 NOTE — Progress Notes (Signed)
Very pleasant 37 yo female here for:    Sore throat- "feels like I'm swallowing glass," since yesterday.  Subjective fever last night. Today has bilateral ear pain.  Mild cough. No CP or SOB.     Patient Active Problem List   Diagnosis Date Noted  . Menorrhagia 11/13/2012  . Bilateral ovarian cysts 11/13/2012  . Depression 01/22/2011  . Decreased sex drive 16/07/9603  . Morbid obesity 02/13/2010  . DIZZINESS 12/12/2009  . OTHER MALAISE AND FATIGUE 12/20/2008  . EDEMA- LOCALIZED 12/16/2008  . CHEST PAIN 12/16/2008  . 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 URI 08/27/2007    Past Medical History  Diagnosis Date  . Bursitis of hip   . Asthma     EXERCISE INDUCED  . Arthritis   . Depression   . Generalized headaches   . Obesity (BMI 30-39.9)   . Irregular menses   . Anemia     Past Surgical History  Procedure Laterality Date  . Achilles tendon lengthening  1989  . Feet surgery  1990    INSERTION OF BONE GRAFT IN FEET  . Lipoma excision  02/2006    abdomen  . Laparoscopic gastric banding  10/17/10    Dr Gaynelle Adu; AP Standard  . Ankle ganglion cyst excision  02/2012    left ankle    History  Substance Use Topics  . Smoking status: Never Smoker   . Smokeless tobacco: Never Used  . Alcohol Use: No    Family History  Problem Relation Age of Onset  . Heart disease Maternal Grandmother     V. Fib arrest  . Hypertension Mother   . Hyperlipidemia Mother   . Hypertension Father   . Hyperlipidemia Father   . Heart disease Father   . Diabetes Father   . Cancer Father     skin  . Diabetes Sister   . Hypertension Sister   . Cancer Maternal Grandfather     lung and skin  . Cancer Paternal Grandfather     stomach and skin    Allergies  Allergen Reactions  . Augmentin (Amoxicillin-Pot Clavulanate)   . Codeine     REACTION: rash    Current Outpatient Prescriptions on File Prior to Visit  Medication Sig Dispense Refill  . ALBUTEROL IN Inhale into the  lungs as needed.        . Calcium Carbonate-Vit D-Min (CALCIUM 1200 PO) Take by mouth daily.        . citalopram (CELEXA) 20 MG tablet Take 1 tablet (20 mg total) by mouth daily.  90 tablet  1  . EPINEPHrine (EPIPEN) 0.3 mg/0.3 mL DEVI Inject 0.3 mLs (0.3 mg total) into the muscle once.  1 Device  0  . Ferrous Sulfate (FEROSUL PO) Take one by mouth daily- pt unsure of strength      . Multiple Vitamin (MULTIVITAMIN PO) Take by mouth daily.        . Vitamin D, Ergocalciferol, (DRISDOL) 50000 UNITS CAPS Take one capsule by mouth once a week x 6 weeks.  6 capsule  0   No current facility-administered medications on file prior to visit.     Review of Systems: as above, eating and drinking - tolerating PO. Urinating normally. No excessive vomitting or diarrhea. O/w as above.  Physical Exam:  BP 102/80  Pulse 80  Temp(Src) 98.4 F (36.9 C)  Wt 234 lb (106.142 kg)  BMI 40.15 kg/m2  LMP 11/07/2012  GEN: WDWN, Non-toxic, Atraumatic, normocephalic. A and  O x 3. HEENT: Oropharynx clear without exudate, MMM, no significant LAD, mild rhinnorhea Ears: TM clear, COL visualized with good landmarks CV: RRR, no m/g/r. Pulm: CTA B, no wheezes, rhonchi, or crackles, normal respiratory effort. EXT: no c/c/e Psych: well oriented, neither depressed nor anxious in appearance  A/P:   1. URI- Rapid strep neg. Symptomatic therapy suggested: push fluids, rest and return office visit prn if symptoms persist or worsen. Lack of antibiotic effectiveness discussed with her. Call or return to clinic prn if these symptoms worsen or fail to improve as anticipated. Will check labs today to rule out anemia or vitamin deficiency given recurrent infections s/p gastric surgery.   The patient indicates understanding of these issues and agrees with the plan.

## 2013-03-18 NOTE — Addendum Note (Signed)
Addended by: Eliezer Bottom on: 03/18/2013 11:31 AM   Modules accepted: Orders

## 2013-03-18 NOTE — Telephone Encounter (Signed)
Ok to tell her to come in now.

## 2013-03-18 NOTE — Telephone Encounter (Signed)
Patient Information:  Caller Name: Renate  Phone: [phone]  Patient: [name], [name]  Gender: Female  DOB: Jun 15, 1976  Age: 37 Years  PCP: Ruthe Mannan [facility])  Pregnant: No  Office Follow Up:  Does the office need to follow up with this patient?: Yes  Instructions For The Office: see RN notes  RN Note:  Patient does not want to wait until later this afternoon to be seen. She will not see Dr. Patsy Lager. Patient is asking if there is any way she could be worked into Dr. Elmer Sow schedule or should she go to an Urgent Care for evaluation? She has children that she has to pick up from school at 3pm, so she needs to be seen before 3pm. Please contact her to let her know if she can get an office visit vs going to UC. Thanks.  Symptoms  Reason For Call & Symptoms: Reports sore throat, nasal congestion, headache, earache.  Reviewed Health History In EMR: Yes  Reviewed Medications In EMR: Yes  Reviewed Allergies In EMR: Yes  Reviewed Surgeries / Procedures: Yes  Date of Onset of Symptoms: 03/17/2013  Treatments Tried: Nyquil  Treatments Tried Worked: No OB / GYN:  LMP: Unknown  Guideline(s) Used:  Sore Throat  Disposition Per Guideline:   See Today in Office  Reason For Disposition Reached:   Severe sore throat pain  Advice Given:  N/A  Patient Will Follow Care Advice:  YES

## 2013-03-22 ENCOUNTER — Encounter: Payer: Self-pay | Admitting: Family Medicine

## 2013-03-24 ENCOUNTER — Telehealth: Payer: Self-pay

## 2013-03-24 NOTE — Telephone Encounter (Signed)
Triage Record Num: [id number] Operator: Patriciaann Clan Patient Name: [name] Call Date & Time: 03/23/2013 9:47:19AM Patient Phone: [phone] PCP: Ruthe Mannan Patient Gender: Female PCP Fax : [phone] Patient DOB: 03-May-1976 Practice Name[facility] Reason for Call: Caller: [place]/Patient; PCP: Ruthe Mannan (Family Practice); CB#: [phone]; Call regarding Cough/Congestion; LMP-Hysterectomy. Patient states she was seen in the office 03/18/13 for sore throat. States she had a negative strep screen and diagnosed with viral illness. Patient states she developed increased cough 03/20/13. States she began expectorating green sputum 03/22/13. Patient states she developed wheezing 03/22/13. Patient states she is using her Albuterol Inhaler q 4 hours for wheezing. Triage per Asthma Protocol. Disposition of " Call Provider Immediatley" obtained related to positive triage assessment for " Producing large amounts of green or yellow mucus when coughing." Patient advised to be evaluated at Urgent Care. Patient states Dr. Dayton Martes advised her to return call, if sx increased, to have a Rx called into Pharmacy. Patient is requesting that MD on call be notified. 1013: Dr. Thomos Lemons notified of above. Orders received: Doxycycline 100mg .; one tablet BID X 10 days; Dispense #20; no refills. Advise patient that medication causes Photosensitivity;avoid sun, wear sunscreen. Advise patient to follow up with Dr. Dayton Martes if no improvement. Above Rx called into [facility] on [address] in [place] at [phone]. Spoke with Pharmcist/Tara. Patient informed of above. Care advice given per guidelines. Patient advised increased fluids, warm fluids with honey, inhaled steam, humidifier, saline nasal washes. Call back parameters reviewed. Patient verbalizes understanding. Protocol(s) Used: Asthma - Adult Recommended Outcome per Protocol: Call Provider Immediately Reason for Outcome:  Producing large amounts of green or yellow mucus when coughing or blowing nose. Care Advice: ~ Use a cool mist humidifier to moisten air. Be sure to clean according to manufacturer's instructions. Increase fluids to 8-12 eight oz (1.6 to 2.4 liters) glasses per day, half of them to be water. Soups, popsicles, fruit juices, non-caffeinated sodas (unless restricting sodium intake), jello, broths, decaf teas, etc. are all okay. Warm fluids can be soothing. ~ ~ IMMEDIATE ACTION 05/

## 2013-04-07 ENCOUNTER — Encounter: Payer: Self-pay | Admitting: Family Medicine

## 2013-04-07 ENCOUNTER — Other Ambulatory Visit: Payer: Self-pay | Admitting: Family Medicine

## 2013-04-07 MED ORDER — PROMETHAZINE HCL 12.5 MG PO TABS: 12.5000 mg | ORAL_TABLET | Freq: Four times a day (QID) | ORAL | Status: DC | PRN

## 2013-04-09 ENCOUNTER — Encounter (INDEPENDENT_AMBULATORY_CARE_PROVIDER_SITE_OTHER): Payer: Self-pay

## 2013-04-09 ENCOUNTER — Ambulatory Visit (INDEPENDENT_AMBULATORY_CARE_PROVIDER_SITE_OTHER): Payer: BC Managed Care – PPO | Admitting: Physician Assistant

## 2013-04-09 VITALS — BP 138/78 | HR 98 | Temp 97.4°F | Resp 14 | Ht 64.0 in | Wt 237.0 lb

## 2013-04-09 DIAGNOSIS — Z4651 Encounter for fitting and adjustment of gastric lap band: Secondary | ICD-10-CM

## 2013-04-09 NOTE — Patient Instructions (Signed)
Take clear liquids tonight. Thin protein shakes are ok to start tomorrow morning. Slowly advance your diet thereafter. Call us if you have persistent vomiting or regurgitation, night cough or reflux symptoms. Return as scheduled or sooner if you notice no changes in hunger/portion sizes.  

## 2013-04-09 NOTE — Progress Notes (Signed)
  HISTORY: [name] is a 37 y.o.female who received an AP-Standard lap-band in December 2011 by Dr. Andrey Campanile. She comes in s/p hysterectomy with good results. She's gained 27 lbs in the interim and she'd like a fill to get her hunger and portion sizes under control. She denies any further regurgiation or reflux.  VITAL SIGNS: Filed Vitals:   04/09/13 1600  BP: 138/78  Pulse: 98  Temp: 97.4 F (36.3 C)  Resp: 14    PHYSICAL EXAM: Physical exam reveals a very well-appearing 37 y.o.female in no apparent distress Neurologic: Awake, alert, oriented Psych: Bright affect, conversant Respiratory: Breathing even and unlabored. No stridor or wheezing Abdomen: Soft, nontender, nondistended to palpation. Incisions well-healed. No incisional hernias. Port easily palpated. Extremities: Atraumatic, good range of motion.  ASSESMENT: 37 y.o.  female  s/p AP-Standard lap-band.   PLAN: The patient's port was accessed with a 20G Huber needle without difficulty. Clear fluid was aspirated and 0.75 mL saline was added to the port. The patient was able to swallow water without difficulty following the procedure and was instructed to take clear liquids for the next 24-48 hours and advance slowly as tolerated.

## 2013-04-10 ENCOUNTER — Ambulatory Visit: Payer: Self-pay | Admitting: Hematology and Oncology

## 2013-04-10 LAB — CBC CANCER CENTER
Basophil #: 0.1 x10 3/mm (ref 0.0–0.1)
Basophil %: 1.3 %
HCT: 41.3 % (ref 35.0–47.0)
HGB: 14.4 g/dL (ref 12.0–16.0)
Lymphocyte #: 1.9 x10 3/mm (ref 1.0–3.6)
Lymphocyte %: 32.6 %
MCH: 29.9 pg (ref 26.0–34.0)
MCV: 86 fL (ref 80–100)
Monocyte #: 0.5 x10 3/mm (ref 0.2–0.9)
Monocyte %: 8.3 %
RBC: 4.79 10*6/uL (ref 3.80–5.20)
WBC: 5.7 x10 3/mm (ref 3.6–11.0)

## 2013-04-10 LAB — FERRITIN: Ferritin (ARMC): 20 ng/mL (ref 8–388)

## 2013-04-10 LAB — RETICULOCYTES
Absolute Retic Count: 0.0941 10*6/uL
Reticulocyte: 1.96 %

## 2013-04-10 LAB — IRON AND TIBC
Iron Bind.Cap.(Total): 359 ug/dL (ref 250–450)
Iron Saturation: 31 %

## 2013-04-13 ENCOUNTER — Telehealth (INDEPENDENT_AMBULATORY_CARE_PROVIDER_SITE_OTHER): Payer: Self-pay | Admitting: *Deleted

## 2013-04-13 ENCOUNTER — Encounter (INDEPENDENT_AMBULATORY_CARE_PROVIDER_SITE_OTHER): Payer: Self-pay | Admitting: Surgery

## 2013-04-13 ENCOUNTER — Ambulatory Visit (INDEPENDENT_AMBULATORY_CARE_PROVIDER_SITE_OTHER): Payer: BC Managed Care – PPO | Admitting: Surgery

## 2013-04-13 NOTE — Telephone Encounter (Signed)
Patient called to report that since receiving her fill last week she has been unable to keep anything down.  Patient scheduled to come into urgent office this afternoon.

## 2013-04-13 NOTE — Progress Notes (Signed)
URGENT OFFICE 04/13/13  Lap band fill on 04/09/13.  Unable to take much PO over the weekend.  We are asked to empty the band, since no bariatric surgeons are in the office today.  Skin prepped with alcohol.  3.5 ml of saline removed from port.  Patient able to tolerate PO liquids without difficulty.    Follow-up with [facility] on Thursday.  [name]. Corliss Skains, MD, [facility]  General/ Trauma Surgery  04/13/2013 4:06 PM

## 2013-04-15 ENCOUNTER — Other Ambulatory Visit: Payer: Self-pay | Admitting: Family Medicine

## 2013-04-15 ENCOUNTER — Encounter: Payer: Self-pay | Admitting: Family Medicine

## 2013-04-16 ENCOUNTER — Encounter (INDEPENDENT_AMBULATORY_CARE_PROVIDER_SITE_OTHER): Payer: Self-pay

## 2013-04-16 ENCOUNTER — Encounter (INDEPENDENT_AMBULATORY_CARE_PROVIDER_SITE_OTHER): Payer: BC Managed Care – PPO

## 2013-04-16 ENCOUNTER — Ambulatory Visit (INDEPENDENT_AMBULATORY_CARE_PROVIDER_SITE_OTHER): Payer: BC Managed Care – PPO | Admitting: Physician Assistant

## 2013-04-16 VITALS — Ht 64.0 in | Wt 235.2 lb

## 2013-04-16 DIAGNOSIS — Z4651 Encounter for fitting and adjustment of gastric lap band: Secondary | ICD-10-CM

## 2013-04-16 NOTE — Progress Notes (Signed)
  HISTORY: [name] is a 37 y.o.female who received an AP-Standard lap-band in December 2011 by Dr. Andrey Campanile. I saw her last week and provided a 0.75 mL fill for increased hunger and portion size. She reports having done well for the first 24 hours following the fill on liquids but the following day she tried a baked potato which she did not tolerate. For the remainder of the weekend she couldn't tolerate anything at all. She was seen in the office on 6/16 (Monday) by Dr. Corliss Skains who graciously removed 3.5 mL fluid to relieve her obstruction. She has been able to tolerate solids and liquids without difficulty since then. She presents today for resumption of fill.  VITAL SIGNS: There were no vitals filed for this visit.  PHYSICAL EXAM: Physical exam reveals a very well-appearing 37 y.o.female in no apparent distress Neurologic: Awake, alert, oriented Psych: Bright affect, conversant Respiratory: Breathing even and unlabored. No stridor or wheezing Abdomen: Soft, nontender, nondistended to palpation. Incisions well-healed. No incisional hernias. Port easily palpated. Extremities: Atraumatic, good range of motion.  ASSESMENT: 37 y.o.  female  s/p AP-Standard lap-band.   PLAN: The patient's port was accessed with a 20G Huber needle without difficulty. Clear fluid was aspirated and 2.5 mL saline was added to the port. The patient was able to swallow water without difficulty following the procedure and was instructed to take clear liquids for the next 24-48 hours and advance slowly as tolerated. I did not replace more fluid for concern of re-obstruction. I didn't charge her for this visit because she returned at the earliest opportunity. She'll return for her currently scheduled appointment in about five weeks.

## 2013-04-16 NOTE — Patient Instructions (Signed)
Take clear liquids tonight. Thin protein shakes are ok to start tomorrow morning. Slowly advance your diet thereafter. Call us if you have persistent vomiting or regurgitation, night cough or reflux symptoms. Return as scheduled or sooner if you notice no changes in hunger/portion sizes.  

## 2013-04-28 ENCOUNTER — Ambulatory Visit: Payer: Self-pay | Admitting: Hematology and Oncology

## 2013-05-11 ENCOUNTER — Encounter: Payer: Self-pay | Admitting: Family Medicine

## 2013-05-12 ENCOUNTER — Encounter: Payer: Self-pay | Admitting: *Deleted

## 2013-05-12 NOTE — Telephone Encounter (Signed)
I have printed a list of the lab tests done in April and will be mailing it to the patient.  She is also asking if she needs follow up labs right now or soon in the future?

## 2013-05-14 ENCOUNTER — Other Ambulatory Visit: Payer: Self-pay | Admitting: Family Medicine

## 2013-05-14 DIAGNOSIS — E559 Vitamin D deficiency, unspecified: Secondary | ICD-10-CM

## 2013-05-15 ENCOUNTER — Other Ambulatory Visit (INDEPENDENT_AMBULATORY_CARE_PROVIDER_SITE_OTHER): Payer: BC Managed Care – PPO

## 2013-05-15 DIAGNOSIS — E559 Vitamin D deficiency, unspecified: Secondary | ICD-10-CM

## 2013-05-19 ENCOUNTER — Other Ambulatory Visit: Payer: Self-pay | Admitting: *Deleted

## 2013-05-19 MED ORDER — VITAMIN D (ERGOCALCIFEROL) 1.25 MG (50000 UNIT) PO CAPS: ORAL_CAPSULE | ORAL | Status: DC

## 2013-05-21 ENCOUNTER — Encounter (INDEPENDENT_AMBULATORY_CARE_PROVIDER_SITE_OTHER): Payer: BC Managed Care – PPO

## 2013-05-28 ENCOUNTER — Encounter (INDEPENDENT_AMBULATORY_CARE_PROVIDER_SITE_OTHER): Payer: Self-pay

## 2013-05-28 ENCOUNTER — Ambulatory Visit (INDEPENDENT_AMBULATORY_CARE_PROVIDER_SITE_OTHER): Payer: BC Managed Care – PPO | Admitting: Physician Assistant

## 2013-05-28 ENCOUNTER — Encounter (INDEPENDENT_AMBULATORY_CARE_PROVIDER_SITE_OTHER): Payer: BC Managed Care – PPO

## 2013-05-28 VITALS — BP 122/78 | HR 66 | Temp 97.8°F | Resp 16 | Ht 64.0 in | Wt 237.0 lb

## 2013-05-28 DIAGNOSIS — Z4651 Encounter for fitting and adjustment of gastric lap band: Secondary | ICD-10-CM

## 2013-05-28 NOTE — Progress Notes (Signed)
  HISTORY: [name] is a 37 y.o.female who received an AP-Standard lap-band in December 2011 by Dr. Andrey Campanile. She comes in with stable weight since her last fill. We are progressively adding fluid after removal of 3.5 mL by Dr. Corliss Skains. She denies regurgitation or reflux but she does complain of hunger and larger portions than desired.  VITAL SIGNS: Filed Vitals:   05/28/13 1346  BP: 122/78  Pulse: 66  Temp: 97.8 F (36.6 C)  Resp: 16    PHYSICAL EXAM: Physical exam reveals a very well-appearing 37 y.o.female in no apparent distress Neurologic: Awake, alert, oriented Psych: Bright affect, conversant Respiratory: Breathing even and unlabored. No stridor or wheezing Abdomen: Soft, nontender, nondistended to palpation. Incisions well-healed. No incisional hernias. Port easily palpated. Extremities: Atraumatic, good range of motion.  ASSESMENT: 37 y.o.  female  s/p AP-Standard lap-band.   PLAN: The patient's port was accessed with a 20G Huber needle without difficulty. Clear fluid was aspirated and 0.5 mL saline was added to the port. The patient was able to swallow water without difficulty following the procedure and was instructed to take clear liquids for the next 24-48 hours and advance slowly as tolerated.

## 2013-05-28 NOTE — Patient Instructions (Signed)

## 2013-07-09 ENCOUNTER — Encounter: Payer: Self-pay | Admitting: Family Medicine

## 2013-07-09 ENCOUNTER — Ambulatory Visit (INDEPENDENT_AMBULATORY_CARE_PROVIDER_SITE_OTHER): Payer: BC Managed Care – PPO | Admitting: Family Medicine

## 2013-07-09 VITALS — BP 110/80 | HR 69 | Temp 98.5°F | Ht 65.0 in | Wt 232.5 lb

## 2013-07-09 DIAGNOSIS — J069 Acute upper respiratory infection, unspecified: Secondary | ICD-10-CM

## 2013-07-09 DIAGNOSIS — J029 Acute pharyngitis, unspecified: Secondary | ICD-10-CM

## 2013-07-09 LAB — POCT RAPID STREP A (OFFICE): Rapid Strep A Screen: POSITIVE — AB

## 2013-07-09 MED ORDER — AMOXICILLIN 500 MG PO CAPS: 1000.0000 mg | ORAL_CAPSULE | Freq: Two times a day (BID) | ORAL | Status: DC

## 2013-07-09 NOTE — Patient Instructions (Signed)
Mucinex to break up mucus, can use decongestant if tolerated. Continue zyrtec and nasonex. Start nasal saline irrigation/spray.

## 2013-07-09 NOTE — Progress Notes (Signed)
  Subjective:    Patient ID: [name], female    DOB: 15-Jan-1976, 37 y.o.   MRN: [id number]  HPI  37 year old female pt of Dr. Elmer Sow with allergies, asthma on allergy shots presents with 24 hours of left > right ear pain, subjective fever, chills,  Sore throat.  no nasal congestion, no sinus pressure, some headache mild, no sob, no cough. No current asthma flare, has not had to use albuterol.   She had local reaction to allergy shot received 2 days ago.  On zyrtec for  Allergies as well.  Has not taken anything for her symptoms.   Review of Systems  Constitutional: Negative for fever and fatigue.  HENT: Negative for ear pain.   Eyes: Negative for pain.  Respiratory: Negative for chest tightness and shortness of breath.   Cardiovascular: Negative for chest pain, palpitations and leg swelling.  Gastrointestinal: Negative for abdominal pain.  Genitourinary: Negative for dysuria.       Objective:   Physical Exam  Constitutional: Vital signs are normal. She appears well-developed and well-nourished. She is cooperative.  Non-toxic appearance. She does not appear ill. No distress.  HENT:  Head: Normocephalic.  Right Ear: Hearing, external ear and ear canal normal. Tympanic membrane is not erythematous, not retracted and not bulging. A middle ear effusion is present.  Left Ear: Hearing, external ear and ear canal normal. Tympanic membrane is not erythematous, not retracted and not bulging. A middle ear effusion is present.  Nose: Mucosal edema and rhinorrhea present. Right sinus exhibits no maxillary sinus tenderness and no frontal sinus tenderness. Left sinus exhibits no maxillary sinus tenderness and no frontal sinus tenderness.  Mouth/Throat: Uvula is midline, oropharynx is clear and moist and mucous membranes are normal.  Eyes: Conjunctivae, EOM and lids are normal. Pupils are equal, round, and reactive to light. Lids are everted and swept, no foreign bodies found.  Neck:  Trachea normal and normal range of motion. Neck supple. Carotid bruit is not present. No mass and no thyromegaly present.  Cardiovascular: Normal rate, regular rhythm, S1 normal, S2 normal, normal heart sounds, intact distal pulses and normal pulses.  Exam reveals no gallop and no friction rub.   No murmur heard. Pulmonary/Chest: Effort normal and breath sounds normal. Not tachypneic. No respiratory distress. She has no decreased breath sounds. She has no wheezes. She has no rhonchi. She has no rales.  Neurological: She is alert.  Skin: Skin is warm, dry and intact. No rash noted.  Psychiatric: Her speech is normal and behavior is normal. Judgment normal. Her mood appears not anxious. Cognition and memory are normal. She does not exhibit a depressed mood.          Assessment & Plan:

## 2013-07-09 NOTE — Assessment & Plan Note (Addendum)
With eustacian tube dysfunction.  Symptomatic care.  Addendum: strep positive... Started on amoxicllin x 10 days

## 2013-07-17 ENCOUNTER — Telehealth: Payer: Self-pay | Admitting: Family Medicine

## 2013-07-17 ENCOUNTER — Telehealth: Payer: Self-pay

## 2013-07-17 MED ORDER — AMOXICILLIN 500 MG PO CAPS: 1000.0000 mg | ORAL_CAPSULE | Freq: Two times a day (BID) | ORAL | Status: DC

## 2013-07-17 NOTE — Telephone Encounter (Signed)
Patient Information:  Caller Name: Savreen  Phone: [phone]  Patient: [name], [name]  Gender: Female  DOB: 03-Sep-1976  Age: 37 Years  PCP: Ruthe Mannan [facility])  Pregnant: No  Office Follow Up:  Does the office need to follow up with this patient?: No  Instructions For The Office: N/A  RN Note:  Patient called office this afternoon, Amox was extended x10 days.  Symptoms  Reason For Call & Symptoms: Sore throat started on Thurs 9/11, developed ear pain,  seen in office with Strep test positive.  Started Amox 500mg  2 tabs BID.  Sore throat decreased some within 2 days then came back more severely the last 2-3 days.  Swollen glands on neck.  Feeling worse again.  Sore throat at 3-4/10 level.  Reviewed Health History In EMR: Yes  Reviewed Medications In EMR: Yes  Reviewed Allergies In EMR: Yes  Reviewed Surgeries / Procedures: Yes  Date of Onset of Symptoms: 07/08/2013  Treatments Tried: Motrin  Treatments Tried Worked: No OB / GYN:  LMP: Unknown  Guideline(s) Used:  Sore Throat  Disposition Per Guideline:   Strep Test Only Visit Today or Tomorrow  Reason For Disposition Reached:   Sore throat is the main symptom and persists > 48 hours  Advice Given:  For Relief of Sore Throat Pain:  Sip warm chicken broth or apple juice.  Suck on hard candy or a throat lozenge (over-the-counter).  Pain Medicines:  For pain relief, you can take either acetaminophen, ibuprofen, or naproxen.  Ibuprofen (e.g., Motrin, Advil):  Take 400 mg (two 200 mg pills) by mouth every 6 hours.  Soft Diet:   Cold drinks and milk shakes are especially good (Reason: swollen tonsils can make some foods hard to swallow).  Contagiousness:   You can return to work or school after the fever is gone and you feel well enough to participate in normal activities. If your doctor determines that you have Strep throat, then you will need to take an antibiotic for 24 hours before you can return.  Call  Back If:  You become worse.  Patient Will Follow Care Advice:  YES

## 2013-07-17 NOTE — Telephone Encounter (Signed)
Patient advised.

## 2013-07-17 NOTE — Telephone Encounter (Signed)
Pt left v/m has been on amoxicillin since 07/09/13 and pt not feeling better. Pt said tested positive for strep throat; sorethroat is not better and wants to know if needs different antibiotic. [facility]. Dr Dayton Martes has already left office. Please advise. Pt said she had tried to call at 3:45 spoke with CAN and was told to cb if no response by 5 pm.

## 2013-07-17 NOTE — Telephone Encounter (Signed)
Amoxil should cover this.  I would extend that for now and f/u with PCP next week if needed. Thanks.

## 2013-07-18 ENCOUNTER — Ambulatory Visit (INDEPENDENT_AMBULATORY_CARE_PROVIDER_SITE_OTHER): Payer: BC Managed Care – PPO | Admitting: Family Medicine

## 2013-07-18 ENCOUNTER — Encounter: Payer: Self-pay | Admitting: Family Medicine

## 2013-07-18 VITALS — BP 120/70 | HR 82 | Temp 98.1°F | Wt 234.0 lb

## 2013-07-18 DIAGNOSIS — J029 Acute pharyngitis, unspecified: Secondary | ICD-10-CM

## 2013-07-18 NOTE — Patient Instructions (Addendum)

## 2013-07-18 NOTE — Progress Notes (Signed)
  Subjective:    Patient ID: [name], female    DOB: 08/20/1976, 37 y.o.   MRN: [id number]  HPI Patient seen for Saturday clinic. She is seen with recurrent symptoms of bilateral earache and sore throat. She was recently seen by primary and had positive strep test and is currently on amoxicillin. She has very similar symptoms now. She has rare cough. Minimal nasal congestion. Intermittent headaches. No nausea or vomiting.  Past Medical History  Diagnosis Date  . Bursitis of hip   . Asthma     EXERCISE INDUCED  . Arthritis   . Depression   . Generalized headaches   . Obesity (BMI 30-39.9)   . Irregular menses   . Anemia    Past Surgical History  Procedure Laterality Date  . Achilles tendon lengthening  1989  . Feet surgery  1990    INSERTION OF BONE GRAFT IN FEET  . Lipoma excision  02/2006    abdomen  . Laparoscopic gastric banding  10/17/10    Dr Gaynelle Adu; AP Standard  . Ankle ganglion cyst excision  02/2012    left ankle  . Abdominal hysterectomy      reports that she has never smoked. She has never used smokeless tobacco. She reports that she does not drink alcohol or use illicit drugs. family history includes Cancer in her father, maternal grandfather, and paternal grandfather; Diabetes in her father and sister; Heart disease in her father and maternal grandmother; Hyperlipidemia in her father and mother; Hypertension in her father, mother, and sister. Allergies  Allergen Reactions  . Augmentin [Amoxicillin-Pot Clavulanate]   . Codeine     REACTION: rash  . Milk-Related Compounds   . Tree Extract       Review of Systems  Constitutional: Positive for fatigue. Negative for fever and chills.  HENT: Positive for sore throat. Negative for congestion.   Respiratory: Positive for cough.   Genitourinary: Negative for dysuria.  Neurological: Positive for headaches.       Objective:   Physical Exam  Constitutional: She appears well-developed and  well-nourished.  HENT:  Right Ear: External ear normal.  Left Ear: External ear normal.  Tonsils are symmetrically enlarged but no significant erythema no exudate  Neck: Neck supple.  Cardiovascular: Normal rate.   Pulmonary/Chest: Effort normal and breath sounds normal. No respiratory distress. She has no wheezes. She has no rales.  Lymphadenopathy:    She has no cervical adenopathy.  Skin: No rash noted.          Assessment & Plan:  Patient presents with recurrent sore throat and bilateral earache currently on amoxicillin. Ear exam is normal. Repeat rapid strep though we explained resistance to group A strep for antibiotics like amoxicillin is not typical. If negative, treat symptomatically  Rapid strep negative.

## 2013-07-20 ENCOUNTER — Telehealth: Payer: Self-pay | Admitting: *Deleted

## 2013-07-20 ENCOUNTER — Ambulatory Visit: Payer: Self-pay | Admitting: Hematology and Oncology

## 2013-07-20 NOTE — Telephone Encounter (Signed)
[facility] Triage Call Report Triage Record Num: [id number] Operator: Jeraldine Loots Patient Name: [name] Call Date & Time: 07/18/2013 8:55:11AM Patient Phone: [phone] PCP: Ruthe Mannan Patient Gender: Female PCP Fax : [phone] Patient DOB: Apr 27, 1976 Practice Name: [facility] Reason for Call: Caller: [name]/Patient; PCP: Other; CB#: [phone]; Patient calling, she is driving to the office now. Was seen on 9/11 for ear pain. Dx with strep throat and has been on Amoxicillin since that time but continues to have a sore throat, ear pain, enlarged glands in her throat the size of golf balls, aching all over. Thinks she has a fever but hasn't checked with a thermometer. Having chills. Triaged per Asthma, needs to be seen in 4 hours per new onset or worsening cough. Scheduled appt at [facility] office for 10:15 with Dr. Caryl Never. She has her inhaler with her and will use same. Protocol(s) Used: Asthma - Adult Recommended Outcome per Protocol: See Provider within 4 hours Reason for Outcome: New onset or worsening cough AND asthma with increasing frequency of flare-ups since last scheduled appointment Care Advice: ~ 09/

## 2013-07-21 ENCOUNTER — Encounter: Payer: Self-pay | Admitting: Family Medicine

## 2013-07-23 LAB — CBC CANCER CENTER
Basophil #: 0.1 x10 3/mm (ref 0.0–0.1)
Basophil %: 1.3 %
HCT: 40.3 % (ref 35.0–47.0)
HGB: 14.1 g/dL (ref 12.0–16.0)
Lymphocyte %: 30 %
MCH: 30.1 pg (ref 26.0–34.0)
MCV: 86 fL (ref 80–100)
Monocyte %: 10 %
Neutrophil %: 52.6 %
Platelet: 253 x10 3/mm (ref 150–440)
RDW: 13.2 % (ref 11.5–14.5)
WBC: 4.8 x10 3/mm (ref 3.6–11.0)

## 2013-07-29 ENCOUNTER — Ambulatory Visit (INDEPENDENT_AMBULATORY_CARE_PROVIDER_SITE_OTHER): Payer: BC Managed Care – PPO | Admitting: General Surgery

## 2013-07-29 ENCOUNTER — Ambulatory Visit
Admission: RE | Admit: 2013-07-29 | Discharge: 2013-07-29 | Disposition: A | Discharge status: Home / Self Care | Payer: BC Managed Care – PPO | Source: Ambulatory Visit | Attending: General Surgery | Admitting: General Surgery

## 2013-07-29 ENCOUNTER — Ambulatory Visit: Payer: Self-pay | Admitting: Hematology and Oncology

## 2013-07-29 ENCOUNTER — Other Ambulatory Visit (INDEPENDENT_AMBULATORY_CARE_PROVIDER_SITE_OTHER): Payer: BC Managed Care – PPO

## 2013-07-29 ENCOUNTER — Encounter (INDEPENDENT_AMBULATORY_CARE_PROVIDER_SITE_OTHER): Payer: Self-pay | Admitting: General Surgery

## 2013-07-29 DIAGNOSIS — Z9884 Bariatric surgery status: Secondary | ICD-10-CM

## 2013-07-29 DIAGNOSIS — R112 Nausea with vomiting, unspecified: Secondary | ICD-10-CM

## 2013-07-29 DIAGNOSIS — Z4651 Encounter for fitting and adjustment of gastric lap band: Secondary | ICD-10-CM

## 2013-07-29 DIAGNOSIS — R12 Heartburn: Secondary | ICD-10-CM

## 2013-07-29 DIAGNOSIS — E559 Vitamin D deficiency, unspecified: Secondary | ICD-10-CM

## 2013-07-29 DIAGNOSIS — E550 Rickets, active: Secondary | ICD-10-CM

## 2013-07-29 DIAGNOSIS — R5381 Other malaise: Secondary | ICD-10-CM

## 2013-07-29 HISTORY — DX: Bariatric surgery status: Z98.84

## 2013-07-29 NOTE — Progress Notes (Signed)
Subjective:     Patient ID: [name], female   DOB: November 20, 1975, 37 y.o.   MRN: [id number]  HPI 37 year old Caucasian female comes in because of epigastric discomfort and burning sensation in her chest. She underwent laparoscopic adjustable gastric band placement in December 2011. She was last seen in the office on 05/28/2013. At that time she had a small adjustment of 0.5 cc added to her band. She states that she was doing well until about several weeks ago. She states that she if she eats or drinks anything after 6 PM she develops a burning sensation in her upper chest. She states that she has to sleep upright. She also complains of discomfort and vomiting in the morning without eating. It will generally be yellowish and sometimes a chunk of food from the night before. She did have a recent episode of strep pharyngitis. The symptoms of difficulty sleeping at night and have only been going on for about a week. She denies any difficulty during the daytime.   Review of Systems     Objective:   Physical Exam BP 138/68  Pulse 64  Resp 16  Ht 5' 4.5" (1.638 m)  Wt 230 lb 12.8 oz (104.69 kg)  BMI 39.02 kg/m2  LMP 11/07/2012  See LapBand flowsheet  Gen: alert, NAD, non-toxic appearing HEENT: normocephalic, atraumatic; pupils equal, no scleral icterus,  Pulm: Lungs clear to auscultation, symmetric chest rise CV: regular rate and rhythm Abd: soft, nontender, nondistended. Well-healed trocar sites. No incisional hernia. Port is in right mid-abdomen, slight tilt Ext: no edema, normal,  Neuro: nonfocal,  Psych: appropriate, judgment normal     Assessment:     S/p LAGB Nausea with vomiting and reflux     Plan:     Based on her symptoms I recommended an adjustment.  After obtaining verbal consent, the abdominal wall was prepped with Chloraprep. The port was accessed with a Huber needle and 1.8 cc of saline was removed to give the patient an expected fill volume of 1.7 cc.  The  patient was able to tolerate sips of water.   She was instructed to stay on liquids for the next 24 hours. She was also instructed to contact us if her symptoms persist. Her band has been in for several years so I do think it's reasonable to check an abdominal x-ray to make sure her band orientation still in the correct position. She will get that done today. We will let her know the results.   If her band is in good position and symptoms resolved f/u 4-6 weeks.   If symptoms persist - she will need band vacation. If band has slipped she will need to come back for band vacation and evaluate for possible revision.   She was also encouraged to get PPI over the counter. I told her to contact the office and let me know if it is less expensive for me to Rx it - if so, i will.   Mary Sella. Andrey Campanile, MD, FACS General, Bariatric, & Minimally Invasive Surgery [facility], [place]

## 2013-07-29 NOTE — Patient Instructions (Signed)
Stay on liquids for next 24 hrs If symptoms persist, please call Get abdominal xray as ordered to check position of your band. We will call you with results Starting taking reflux medication daily - Prilosec or Nexium once a day, if cheaper for me to prescribe just send me a message in MyChart

## 2013-07-30 ENCOUNTER — Encounter: Payer: Self-pay | Admitting: Family Medicine

## 2013-07-30 ENCOUNTER — Telehealth (INDEPENDENT_AMBULATORY_CARE_PROVIDER_SITE_OTHER): Payer: Self-pay | Admitting: General Surgery

## 2013-07-30 LAB — VITAMIN D 25 HYDROXY (VIT D DEFICIENCY, FRACTURES): Vit D, 25-Hydroxy: 36 ng/mL (ref 30–89)

## 2013-07-30 NOTE — Telephone Encounter (Signed)
Patient called back and was given below message.  Patient states she is feeling much better since she had some fluid removed.  Encouraged patient to give [place] a call back if she has any further issues or concerns.  Patient states understanding and agreeable at this time.

## 2013-07-30 NOTE — Telephone Encounter (Signed)
LMOM for pt to call back to give her results...please see below

## 2013-07-30 NOTE — Telephone Encounter (Signed)
Message copied by June Leap on Thu Jul 30, 2013 10:34 AM ------      Message from: Andrey Campanile, ERIC M      Created: Thu Jul 30, 2013 10:02 AM       Band appears to be in good position. pls call pt and inform her. See how she is doing. If still having issues at night, needs to come in for adjustment ------

## 2013-07-31 ENCOUNTER — Encounter: Payer: Self-pay | Admitting: Family Medicine

## 2013-07-31 LAB — VITAMIN B12: Vitamin B-12: 993 pg/mL — ABNORMAL HIGH (ref 211–911)

## 2013-08-27 ENCOUNTER — Encounter (INDEPENDENT_AMBULATORY_CARE_PROVIDER_SITE_OTHER): Payer: BC Managed Care – PPO

## 2013-09-03 ENCOUNTER — Other Ambulatory Visit: Payer: Self-pay

## 2013-09-03 ENCOUNTER — Encounter (INDEPENDENT_AMBULATORY_CARE_PROVIDER_SITE_OTHER): Payer: BC Managed Care – PPO

## 2013-10-02 ENCOUNTER — Telehealth: Payer: Self-pay

## 2013-10-02 MED ORDER — MALATHION 0.5 % EX LOTN: TOPICAL_LOTION | Freq: Once | CUTANEOUS | Status: DC

## 2013-10-02 NOTE — Telephone Encounter (Signed)
Pt's daughter being treated today for head lice by pediatrician. Pt and her children have had head lice x 3 since 09/07/13 and has been treating with OTC meds. Pt request med for head lice sent to [facility]. Pt said she could not afford to go to an UC and her doctor's office is open. Advised no more available appts today and pt did call after 4 pm but I would send request. Pt wants cb.

## 2013-10-02 NOTE — Telephone Encounter (Signed)
Rx sent in

## 2013-10-02 NOTE — Telephone Encounter (Signed)
Dr Ermalene Searing said she would prescribe med for pt and I notified pt and she does not know the name of med prescribed by Dr Tracey Harries. I spoke with pharmacist at [facility] and Ovide lotion was prescribed by Dr Tracey Harries for pts children.Please advise.

## 2013-10-02 NOTE — Telephone Encounter (Signed)
Faxed to [facility]. [address] [place] [phone].

## 2013-10-13 ENCOUNTER — Ambulatory Visit: Payer: Self-pay | Admitting: Hematology and Oncology

## 2013-10-13 LAB — CBC CANCER CENTER
Eosinophil #: 0.3 x10 3/mm (ref 0.0–0.7)
HCT: 40.9 % (ref 35.0–47.0)
HGB: 13.7 g/dL (ref 12.0–16.0)
Lymphocyte #: 2.2 x10 3/mm (ref 1.0–3.6)
Lymphocyte %: 35.2 %
MCH: 29.2 pg (ref 26.0–34.0)
Monocyte #: 0.4 x10 3/mm (ref 0.2–0.9)
Monocyte %: 7.2 %
Neutrophil %: 51 %
Platelet: 270 x10 3/mm (ref 150–440)
RBC: 4.68 10*6/uL (ref 3.80–5.20)
RDW: 13.3 % (ref 11.5–14.5)

## 2013-10-29 ENCOUNTER — Ambulatory Visit: Payer: Self-pay | Admitting: Hematology and Oncology

## 2013-11-03 ENCOUNTER — Encounter: Payer: Self-pay | Admitting: Internal Medicine

## 2013-11-03 ENCOUNTER — Ambulatory Visit (INDEPENDENT_AMBULATORY_CARE_PROVIDER_SITE_OTHER): Payer: BC Managed Care – PPO | Admitting: Internal Medicine

## 2013-11-03 VITALS — BP 134/76 | HR 111 | Temp 102.8°F | Wt 250.8 lb

## 2013-11-03 DIAGNOSIS — R05 Cough: Secondary | ICD-10-CM

## 2013-11-03 DIAGNOSIS — R059 Cough, unspecified: Secondary | ICD-10-CM

## 2013-11-03 DIAGNOSIS — R509 Fever, unspecified: Secondary | ICD-10-CM

## 2013-11-03 DIAGNOSIS — J111 Influenza due to unidentified influenza virus with other respiratory manifestations: Secondary | ICD-10-CM

## 2013-11-03 LAB — POCT INFLUENZA A/B
INFLUENZA A, POC: POSITIVE
Influenza B, POC: POSITIVE

## 2013-11-03 MED ORDER — OSELTAMIVIR PHOSPHATE 75 MG PO CAPS: 75.0000 mg | ORAL_CAPSULE | Freq: Two times a day (BID) | ORAL | Status: DC

## 2013-11-03 NOTE — Progress Notes (Signed)
Pre-visit discussion using our clinic review tool. No additional management support is needed unless otherwise documented below in the visit note.  

## 2013-11-03 NOTE — Patient Instructions (Signed)

## 2013-11-03 NOTE — Progress Notes (Signed)
HPI  Pt presents to the clinic today with c/o fever and cough. This started this morning. She has been running a fever up to 102. She also c/o headaches, sore throat, body aches, fatigue. She has taken Nyquil and Advil OTC. She does have a history of asthma, mostly exercise induced. She has had her flu shot. She has had sick contacts.  Review of Systems      Past Medical History  Diagnosis Date  . Bursitis of hip   . Asthma     EXERCISE INDUCED  . Arthritis   . Depression   . Generalized headaches   . Obesity (BMI 30-39.9)   . Irregular menses   . Anemia     Family History  Problem Relation Age of Onset  . Heart disease Maternal Grandmother     V. Fib arrest  . Hypertension Mother   . Hyperlipidemia Mother   . Hypertension Father   . Hyperlipidemia Father   . Heart disease Father   . Diabetes Father   . Cancer Father     skin  . Diabetes Sister   . Hypertension Sister   . Cancer Maternal Grandfather     lung and skin  . Cancer Paternal Grandfather     stomach and skin    History   Social History  . Marital Status: Married    Spouse Name: N/A    Number of Children: N/A  . Years of Education: N/A   Occupational History  . Not on file.   Social History Main Topics  . Smoking status: Never Smoker   . Smokeless tobacco: Never Used  . Alcohol Use: No  . Drug Use: No  . Sexual Activity: Not on file   Other Topics Concern  . Not on file   Social History Narrative  . No narrative on file    Allergies  Allergen Reactions  . Augmentin [Amoxicillin-Pot Clavulanate]   . Codeine     REACTION: rash  . Milk-Related Compounds   . Tree Extract      Constitutional: Positive headache, fatigue and fever. Denies abrupt weight changes.  HEENT:  Positive sore throat. Denies eye redness, eye pain, pressure behind the eyes, facial pain, nasal congestion, ear pain, ringing in the ears, wax buildup, runny nose or bloody nose. Respiratory: Positive cough. Denies  difficulty breathing or shortness of breath.  Cardiovascular: Denies chest pain, chest tightness, palpitations or swelling in the hands or feet.   No other specific complaints in a complete review of systems (except as listed in HPI above).  Objective:   BP 134/76  Pulse 111  Temp(Src) 102.8 F (39.3 C) (Oral)  Wt 250 lb 12 oz (113.739 kg)  SpO2 98%  LMP 11/07/2012 Wt Readings from Last 3 Encounters:  11/03/13 250 lb 12 oz (113.739 kg)  07/29/13 230 lb 12.8 oz (104.69 kg)  07/18/13 234 lb (106.142 kg)     General: Appears his stated age, ill appearing in NAD. HEENT: Head: normal shape and size; Eyes: sclera white, no icterus, conjunctiva pink, PERRLA and EOMs intact; Ears: Tm's gray and intact, normal light reflex; Nose: mucosa pink and moist, septum midline; Throat/Mouth: + PND. Teeth present, mucosa erythematous and moist, no exudate noted, no lesions or ulcerations noted.  Neck: Mild cervical lymphadenopathy. Neck supple, trachea midline. No massses, lumps or thyromegaly present.  Cardiovascular: Normal rate and rhythm. S1,S2 noted.  No murmur, rubs or gallops noted. No JVD or BLE edema. No carotid bruits noted. Pulmonary/Chest: Normal  effort and positive vesicular breath sounds. No respiratory distress. No wheezes, rales or ronchi noted.      Assessment & Plan:   Influenza  Rapid Flu- positive Get some rest and drink plenty of water Do salt water gargles for the sore throat eRx for Tamiflu Supportive care with tylenol, rest, etc  RTC as needed or if symptoms persist.

## 2013-11-16 ENCOUNTER — Telehealth: Payer: Self-pay

## 2013-11-16 NOTE — Telephone Encounter (Signed)
Pt left v/m was diagnosed with flu 11/03/13; took tamiflu, was out of work for 1 week; pt felt better and went back to work. Now pt is starting to get sick again; when blows nose has yellow mucus; prod cough with yellow green phlegm,scratchy throat,achy in upper body and h/a. No fever. No available appts today. Pt wants to know what to do; does not want to miss anymore work and needs advice today. Pt [facility] if needed.Please advise.

## 2013-11-16 NOTE — Telephone Encounter (Signed)
Yes unfortunately we are really busy today.  I do advise going to UC to get re evaluated.  I do have an opening tomorrow if she can wait until then.

## 2013-11-16 NOTE — Telephone Encounter (Signed)
Spoke to [name] and advised per Dr Dayton MartesAron; states that she will go to urgent care as she feels she needs to be seen today

## 2014-01-03 ENCOUNTER — Encounter (INDEPENDENT_AMBULATORY_CARE_PROVIDER_SITE_OTHER): Payer: Self-pay | Admitting: General Surgery

## 2014-01-05 ENCOUNTER — Telehealth (INDEPENDENT_AMBULATORY_CARE_PROVIDER_SITE_OTHER): Payer: Self-pay | Admitting: General Surgery

## 2014-01-05 NOTE — Telephone Encounter (Signed)
LMOM for patient to call back and [facility]. I need to make her an apt to [facility] or Dr Andrey CampanileWilson within the next week

## 2014-01-07 ENCOUNTER — Encounter (INDEPENDENT_AMBULATORY_CARE_PROVIDER_SITE_OTHER): Payer: BC Managed Care – PPO

## 2014-01-21 ENCOUNTER — Ambulatory Visit (INDEPENDENT_AMBULATORY_CARE_PROVIDER_SITE_OTHER): Payer: BC Managed Care – PPO | Admitting: Physician Assistant

## 2014-01-21 ENCOUNTER — Encounter (INDEPENDENT_AMBULATORY_CARE_PROVIDER_SITE_OTHER): Payer: Self-pay

## 2014-01-21 VITALS — BP 132/96 | HR 80 | Temp 97.7°F | Resp 15 | Ht 64.5 in | Wt 254.2 lb

## 2014-01-21 DIAGNOSIS — Z4651 Encounter for fitting and adjustment of gastric lap band: Secondary | ICD-10-CM

## 2014-01-21 NOTE — Progress Notes (Signed)
  HISTORY: [name] is a 38 y.o.female who received an AP-Standard lap-band in December 2011 by Dr. Andrey CampanileWilson. She comes in with 23 lbs weight gain since removal of 1.8 mL of fluid in October 2014. She has no further symptoms of regurgitation or reflux but she is definitely more hungry than previously. She would like a fill today but not to the same degree of what was removed. She had trouble with this fill volume from its inception in July.  VITAL SIGNS: Filed Vitals:   01/21/14 1524  BP: 132/96  Pulse: 80  Temp: 97.7 F (36.5 C)  Resp: 15    PHYSICAL EXAM: Physical exam reveals a very well-appearing 38 y.o.female in no apparent distress Neurologic: Awake, alert, oriented Psych: Bright affect, conversant Respiratory: Breathing even and unlabored. No stridor or wheezing Abdomen: Soft, nontender, nondistended to palpation. Incisions well-healed. No incisional hernias. Port easily palpated. Extremities: Atraumatic, good range of motion.  ASSESMENT: 38 y.o.  female  s/p AP-Standard lap-band.   PLAN: The patient's port was accessed with a 20G Huber needle without difficulty. Clear fluid was aspirated and 1 mL saline was added to the port. The patient was able to swallow water without difficulty following the procedure and was instructed to take clear liquids for the next 24-48 hours and advance slowly as tolerated.

## 2014-01-21 NOTE — Patient Instructions (Signed)

## 2014-02-17 ENCOUNTER — Ambulatory Visit (INDEPENDENT_AMBULATORY_CARE_PROVIDER_SITE_OTHER): Payer: BC Managed Care – PPO | Admitting: General Surgery

## 2014-02-17 ENCOUNTER — Encounter (INDEPENDENT_AMBULATORY_CARE_PROVIDER_SITE_OTHER): Payer: Self-pay | Admitting: General Surgery

## 2014-02-17 VITALS — BP 130/78 | HR 80 | Temp 97.6°F | Resp 14 | Ht 64.5 in | Wt 254.0 lb

## 2014-02-17 DIAGNOSIS — Z4651 Encounter for fitting and adjustment of gastric lap band: Secondary | ICD-10-CM

## 2014-02-17 DIAGNOSIS — Z9884 Bariatric surgery status: Secondary | ICD-10-CM

## 2014-02-17 DIAGNOSIS — R1110 Vomiting, unspecified: Secondary | ICD-10-CM

## 2014-02-17 NOTE — Patient Instructions (Signed)
1. Stay on liquids for the next 1-2 days as you adapt to your new fill volume.  Then resume your previous diet. 2. Decreasing your carbohydrate intake will hasten you weight loss.  Rely more on proteins for your meals.  Avoid condiments that contain sweets such as Honey Mustard and sugary salad dressings.   3. Stay in the "green zone".  If you are regurgitating with meals, having night time reflux, and find yourself eating soft comfort foods (mashed potatoes, potato chips)...realize that you are developing "maladaptive eating".  You will not lose weight this way and may regain weight.  The GREEN ZONE is eating smaller portions and not regurgitating.  Hence we may need to withdraw fluid from your band. 4. Build exercise into your daily routine.  Walking is the best way to start but do something every day if you can.     Anytime you vomit - you need to go back to liquids for the rest of the day Watch the EMMI video  Eating techniques 20-20-20 (30-30-30) 20 chews, 20 seconds between bites of food, 20 minutes to eat; sometimes you may need 30 chews, 30 seconds etc Use your nondominant hand to eat with Put fork down between bites of food Use a timer after swallowing to reinforce waiting 20-30 sec between bites of food Use a child/infant size utensil Try not to eat while watching TV

## 2014-02-18 NOTE — Progress Notes (Signed)
Subjective:     Patient ID: [name], female   DOB: Sep 27, 1976, 38 y.o.   MRN: [id number]  HPI 38 year old Caucasian female comes in for long-term followup after undergoing laparoscopic adjustable gastric band surgery in December 2011. She was last seen in the clinic on March 26. At that time her weight was 254 pounds. She comes in today complaining of worsening regurgitation. After her last adjustment last month she started having worsening reflux about 2 weeks ago. She started having to sleep upright. She did try to take some over-the-counter Mylanta which did help some. However over the past several days she is regurgitating more. She is now having daily regurgitation. She also states her reflux is worse. She states that she is probably not waiting 20-30 seconds between bites of food.  Review of Systems     Objective:   Physical Exam BP 130/78  Pulse 80  Temp(Src) 97.6 F (36.4 C)  Resp 14  Ht 5' 4.5" (1.638 m)  Wt 254 lb (115.214 kg)  BMI 42.94 kg/m2  LMP 11/07/2012  See LapBand flowsheet  Gen: alert, NAD, non-toxic appearing HEENT: normocephalic, atraumatic; pupils equal, no scleral icterus, neck supple, Pulm: Lungs clear to auscultation, symmetric chest rise CV: regular rate and rhythm Abd: soft, nontender, nondistended. Well-healed trocar sites. No incisional hernia. Port is in right mid-abdomen Ext: no edema, normal,  Neuro: nonfocal, sensation grossly intact Psych: appropriate, judgment normal     Assessment:     Status post laparoscopic adjustable gastric band Reflux-regurgitation     Plan:     I do think she needs an adjustment.  After obtaining verbal consent, the abdominal wall was prepped with Chloraprep. The port was accessed with a Huber needle and 0.75 cc of saline was removed to give the patient an expected fill volume of 1.5 cc.  The patient was able to tolerate sips of water.   She was instructed to stay on liquids for the next 24 hours.  We  also discussed that any time she has regurgitation she should revert back to liquids for the rest of the day because of the probable edema that happens around the upper stomach with regurgitation.  I do believe she stands to have improvement with respect to her eating techniques. We discussed that she should take one small bite, to 20-30 times, and weight 20-30 seconds between bites of food. We discussed strategies to help achieve this.  She was also given access to watch an on line video about band adjustments.  Followup 6 weeks  Mary SellaEric M. Andrey CampanileWilson, MD, FACS General, Bariatric, & Minimally Invasive Surgery [facility], [place]

## 2014-03-18 ENCOUNTER — Encounter (INDEPENDENT_AMBULATORY_CARE_PROVIDER_SITE_OTHER): Payer: BC Managed Care – PPO

## 2014-03-31 ENCOUNTER — Telehealth (INDEPENDENT_AMBULATORY_CARE_PROVIDER_SITE_OTHER): Payer: Self-pay | Admitting: *Deleted

## 2014-03-31 NOTE — Telephone Encounter (Signed)
LM for pt to return my call regarding her appt for Lap band clinic.  Please find me when pt returns my call.    Thanks!  Victorino Dike

## 2014-04-01 ENCOUNTER — Encounter (INDEPENDENT_AMBULATORY_CARE_PROVIDER_SITE_OTHER): Payer: BC Managed Care – PPO

## 2014-04-21 ENCOUNTER — Other Ambulatory Visit: Payer: Self-pay | Admitting: Obstetrics and Gynecology

## 2014-04-23 LAB — CYTOLOGY - PAP

## 2014-04-28 ENCOUNTER — Encounter: Payer: Self-pay | Admitting: Family Medicine

## 2014-04-29 ENCOUNTER — Encounter: Payer: Self-pay | Admitting: Family Medicine

## 2014-04-29 NOTE — Telephone Encounter (Signed)
Contact pt via phone and offered her only available appt today at 1400 with Dr Para Marchuncan. Pt states that she is not itching as much since taking Benadryl. She indicates that she will wait to see ENT who will be handling her allergies

## 2014-08-31 DIAGNOSIS — J45909 Unspecified asthma, uncomplicated: Secondary | ICD-10-CM | POA: Insufficient documentation

## 2014-11-16 ENCOUNTER — Ambulatory Visit (INDEPENDENT_AMBULATORY_CARE_PROVIDER_SITE_OTHER): Payer: BC Managed Care – PPO | Admitting: Internal Medicine

## 2014-11-16 ENCOUNTER — Encounter: Payer: Self-pay | Admitting: Internal Medicine

## 2014-11-16 VITALS — HR 102 | Temp 98.2°F | Ht 65.0 in | Wt 255.2 lb

## 2014-11-16 DIAGNOSIS — J069 Acute upper respiratory infection, unspecified: Secondary | ICD-10-CM

## 2014-11-16 MED ORDER — SULFAMETHOXAZOLE-TRIMETHOPRIM 800-160 MG PO TABS: 1.0000 | ORAL_TABLET | Freq: Two times a day (BID) | ORAL | Status: DC

## 2014-11-16 NOTE — Progress Notes (Signed)
   Subjective:    Patient ID: [name], female    DOB: 05-29-1976, 39 y.o.   MRN: [id number]  HPI   Symptoms began last night [facility]. She describes marked rhinitis and head congestion associated with itchy, watery eyes, and sneezing  The secretions are essentially all clear with scant discoloration. She questions whether she had had some fever ; she definitely thought she had chills and sweats.  She also has had some joint pain.  She has not had the flu shot as she's taking allergy shots 2 times a week . She is allergic to codeine and Augmentin  Review of Systems  She denies significant frontal headache or facial pain. She has no otic pain or otic discharge. She also has no dental pain.  Although she has a history of asthma she has no wheezing or shortness of breath at this time.    Objective:   Physical Exam  The only positive finding is profound edema and increased mucus in the nares.  General appearance:good health ;well nourished; no acute distress or increased work of breathing is present.  No  lymphadenopathy about the head, neck, or axilla noted.   Eyes: No conjunctival inflammation or lid edema is present. There is no scleral icterus.  Ears:  External ear exam shows no significant lesions or deformities.  Otoscopic examination reveals clear canals, tympanic membranes are intact bilaterally without bulging, retraction, inflammation or discharge.  Nose:  External nasal examination shows no deformity or inflammation.    Oral exam: Dental hygiene is good; lips and gums are healthy appearing.There is no oropharyngeal erythema or exudate noted.   Neck:  No deformities, thyromegaly, masses, or tenderness noted.   Supple with full range of motion without pain.   Heart:  Normal rate and regular rhythm. S1 and S2 normal without gallop, murmur, click, rub or other extra sounds.   Lungs:Chest clear to auscultation; no wheezes, rhonchi,rales  ,or rubs present.No increased work of breathing.    Extremities:  No cyanosis, edema, or clubbing  noted    Skin: Warm & dry w/o jaundice or tenting.      Assessment & Plan:  #1 rhinitis; allergic versus viral URI  Plan: See orders recommendations

## 2014-11-16 NOTE — Patient Instructions (Signed)
Plain Mucinex (NOT D) for thick secretions ;force NON dairy fluids .   Nasal cleansing in the shower as discussed with lather of mild shampoo.After 10 seconds wash off lather while  exhaling through nostrils. Make sure that all residual soap is removed to prevent irritation.  Flonase OR Nasacort AQ 1 spray in each nostril twice a day as needed. Use the "crossover" technique into opposite nostril spraying toward opposite ear @ 45 degree angle, not straight up into nostril.  Plain Allegra (NOT D )  160 daily , Loratidine 10 mg , OR Zyrtec 10 mg @ bedtime  as needed for itchy eyes & sneezing.  Fill the  prescription for antibiotic it there is not dramatic improvement in the next 72 hours. 

## 2014-11-16 NOTE — Progress Notes (Signed)
Pre visit review using our clinic review tool, if applicable. No additional management support is needed unless otherwise documented below in the visit note. 

## 2014-11-23 ENCOUNTER — Telehealth: Payer: Self-pay | Admitting: *Deleted

## 2014-11-23 NOTE — Telephone Encounter (Signed)
Noted.  Thank you.  I am glad she is feeling better.

## 2014-11-23 NOTE — Telephone Encounter (Signed)
Called patient to see how she is doing after allergic reaction to medication. Patient stated that she did not go to the ER because she knew what she had to do to take care of the problem. Patient stated that she took benadryl, used her inhaler and went outside and walked in the cold air for about 30 minutes. Patient stated that with this treatment the hives went away and her SOB subsided and she did not have to use her epi-pen. Patient stated that her cold symptoms have improved. Advised patient to call back if she does not continue to improve. Patient requested that the medication she had a reaction to be put on her allergy list. Done.

## 2015-07-20 ENCOUNTER — Other Ambulatory Visit: Payer: Self-pay | Admitting: Obstetrics and Gynecology

## 2015-07-21 LAB — CYTOLOGY - PAP

## 2016-04-23 ENCOUNTER — Encounter: Payer: Self-pay | Admitting: Family Medicine

## 2016-04-23 ENCOUNTER — Ambulatory Visit (INDEPENDENT_AMBULATORY_CARE_PROVIDER_SITE_OTHER): Payer: BC Managed Care – PPO | Admitting: Family Medicine

## 2016-04-23 ENCOUNTER — Other Ambulatory Visit: Payer: Self-pay

## 2016-04-23 ENCOUNTER — Other Ambulatory Visit: Payer: Self-pay | Admitting: Family Medicine

## 2016-04-23 VITALS — BP 142/98 | HR 83 | Temp 97.8°F | Wt 210.5 lb

## 2016-04-23 DIAGNOSIS — H9201 Otalgia, right ear: Secondary | ICD-10-CM | POA: Diagnosis not present

## 2016-04-23 DIAGNOSIS — H9209 Otalgia, unspecified ear: Secondary | ICD-10-CM | POA: Insufficient documentation

## 2016-04-23 MED ORDER — AZITHROMYCIN 250 MG PO TABS: ORAL_TABLET | ORAL | Status: DC

## 2016-04-23 MED ORDER — BUPROPION HCL ER (XL) 300 MG PO TB24: 300.0000 mg | ORAL_TABLET | Freq: Every day | ORAL | Status: DC

## 2016-04-23 NOTE — Progress Notes (Signed)
Pre visit review using our clinic review tool, if applicable. No additional management support is needed unless otherwise documented below in the visit note. 

## 2016-04-23 NOTE — Telephone Encounter (Signed)
Pt left note at front desk;pt was just seen; had requested refill bupropion from [facility]; pt did not mention to Dr Dayton MartesAron at appt. Pt request refill bupropion 300mg  to [facility] [address]. Last refilled # 90 x 1 on 04/11/2011. pt request cb when refilled.

## 2016-04-23 NOTE — Progress Notes (Signed)
Subjective:   Patient ID: [name], female    DOB: Mar 17, 1976, 40 y.o.   MRN: [id number]  [name] is a pleasant 40 y.o. year old female who presents to clinic today with Ear Pain  on 04/23/2016  HPI:   Bilateral ear pain- ongoing for weeks, right > left. Intermittent URI symptoms.  No fevers or chills.   Current Outpatient Prescriptions on File Prior to Visit  Medication Sig Dispense Refill  . albuterol (PROAIR HFA) 108 (90 BASE) MCG/ACT inhaler Inhale 2 puffs into the lungs every 6 (six) hours as needed for wheezing.    Marland Kitchen. buPROPion (WELLBUTRIN XL) 300 MG 24 hr tablet Take 150 mg by mouth daily.     Marland Kitchen. EPINEPHrine (EPIPEN) 0.3 mg/0.3 mL DEVI Inject 0.3 mLs (0.3 mg total) into the muscle once. 1 Device 0  . fexofenadine-pseudoephedrine (ALLEGRA-D 24) 180-240 MG per 24 hr tablet Take 1 tablet by mouth daily.    . fluticasone (FLONASE) 50 MCG/ACT nasal spray Place into both nostrils daily.    . Multiple Vitamin (MULTIVITAMIN PO) Take by mouth daily.       No current facility-administered medications on file prior to visit.    Allergies  Allergen Reactions  . Septra Ds [Sulfamethoxazole-Trimethoprim] Anaphylaxis    Hives, SOB, itching  . Augmentin [Amoxicillin-Pot Clavulanate]   . Codeine     REACTION: rash  . Milk-Related Compounds   . Tree Extract     Past Medical History  Diagnosis Date  . Bursitis of hip   . Asthma     EXERCISE INDUCED  . Arthritis   . Depression   . Generalized headaches   . Obesity (BMI 30-39.9)   . Irregular menses   . Anemia     Past Surgical History  Procedure Laterality Date  . Achilles tendon lengthening  1989  . Feet surgery  1990    INSERTION OF BONE GRAFT IN FEET  . Lipoma excision  02/2006    abdomen  . Laparoscopic gastric banding  10/17/10    Dr Gaynelle AduEric Wilson; [facility] Standard  . Ankle ganglion cyst excision  02/2012    left ankle  . Abdominal hysterectomy      Family History  Problem Relation Age of Onset  .  Heart disease Maternal Grandmother     V. Fib arrest  . Hypertension Mother   . Hyperlipidemia Mother   . Hypertension Father   . Hyperlipidemia Father   . Heart disease Father   . Diabetes Father   . Cancer Father     skin  . Diabetes Sister   . Hypertension Sister   . Cancer Maternal Grandfather     lung and skin  . Cancer Paternal Grandfather     stomach and skin    Social History   Social History  . Marital Status: Married    Spouse Name: N/A  . Number of Children: N/A  . Years of Education: N/A   Occupational History  . Not on file.   Social History Main Topics  . Smoking status: Never Smoker   . Smokeless tobacco: Never Used  . Alcohol Use: No  . Drug Use: No  . Sexual Activity: Not on file   Other Topics Concern  . Not on file   Social History Narrative   The PMH, PSH, Social History, Family History, Medications, and allergies have been reviewed in [facility], and have been updated if relevant.  Review of Systems  HENT: Positive for congestion  and ear pain. Negative for hearing loss and sinus pressure.   Respiratory: Negative.   Cardiovascular: Negative.   Musculoskeletal: Negative.   Skin: Negative.   Psychiatric/Behavioral: Negative.   All other systems reviewed and are negative.      Objective:    BP 142/98 mmHg  Pulse 83  Temp(Src) 97.8 F (36.6 C) (Oral)  Wt 210 lb 8 oz (95.482 kg)  SpO2 98%  LMP 11/07/2012  Wt Readings from Last 3 Encounters:  04/23/16 210 lb 8 oz (95.482 kg)  11/16/14 255 lb 4 oz (115.781 kg)  02/17/14 254 lb (115.214 kg)    Physical Exam  Constitutional: She is oriented to person, place, and time. She appears well-developed and well-nourished. No distress.  HENT:  Head: Normocephalic.  Right Ear: No hemotympanum.  Left Ear: Tympanic membrane is erythematous. A middle ear effusion is present.  Neck: Normal range of motion.  Cardiovascular: Normal rate.   Pulmonary/Chest: Effort normal.  Musculoskeletal: Normal  range of motion.  Neurological: She is alert and oriented to person, place, and time. No cranial nerve deficit.  Skin: Skin is warm and dry. She is not diaphoretic.  Psychiatric: She has a normal mood and affect. Her behavior is normal. Judgment and thought content normal.  Nursing note and vitals reviewed.         Assessment & Plan:   Acute ear pain, right No Follow-up on file.

## 2016-04-23 NOTE — Patient Instructions (Signed)
Good to see you.  Please take zpack as directed. Consider adding zyzal.

## 2016-04-29 ENCOUNTER — Encounter: Payer: Self-pay | Admitting: Family Medicine

## 2016-05-16 ENCOUNTER — Encounter: Payer: Self-pay | Admitting: Family Medicine

## 2016-05-24 ENCOUNTER — Other Ambulatory Visit: Payer: Self-pay | Admitting: Obstetrics and Gynecology

## 2016-05-24 DIAGNOSIS — N6459 Other signs and symptoms in breast: Secondary | ICD-10-CM

## 2016-05-28 ENCOUNTER — Ambulatory Visit
Admission: RE | Admit: 2016-05-28 | Discharge: 2016-05-28 | Disposition: A | Discharge status: Home / Self Care | Payer: BC Managed Care – PPO | Source: Ambulatory Visit | Attending: Obstetrics and Gynecology | Admitting: Obstetrics and Gynecology

## 2016-05-28 DIAGNOSIS — N6459 Other signs and symptoms in breast: Secondary | ICD-10-CM

## 2016-05-30 ENCOUNTER — Other Ambulatory Visit: Payer: Self-pay | Admitting: Obstetrics and Gynecology

## 2016-05-30 DIAGNOSIS — N6452 Nipple discharge: Secondary | ICD-10-CM

## 2016-07-25 ENCOUNTER — Ambulatory Visit
Admission: RE | Admit: 2016-07-25 | Discharge: 2016-07-25 | Disposition: A | Discharge status: Home / Self Care | Payer: BC Managed Care – PPO | Source: Ambulatory Visit | Attending: Obstetrics and Gynecology | Admitting: Obstetrics and Gynecology

## 2016-07-25 ENCOUNTER — Other Ambulatory Visit: Payer: Self-pay | Admitting: Obstetrics and Gynecology

## 2016-07-25 DIAGNOSIS — N6452 Nipple discharge: Secondary | ICD-10-CM

## 2016-07-26 ENCOUNTER — Other Ambulatory Visit: Payer: Self-pay | Admitting: Obstetrics and Gynecology

## 2016-07-26 DIAGNOSIS — N6452 Nipple discharge: Secondary | ICD-10-CM

## 2016-08-05 ENCOUNTER — Other Ambulatory Visit: Payer: BC Managed Care – PPO

## 2016-08-15 ENCOUNTER — Other Ambulatory Visit: Payer: BC Managed Care – PPO

## 2016-08-20 ENCOUNTER — Other Ambulatory Visit: Payer: Self-pay | Admitting: General Surgery

## 2016-08-20 DIAGNOSIS — N6452 Nipple discharge: Secondary | ICD-10-CM

## 2016-08-20 DIAGNOSIS — Z803 Family history of malignant neoplasm of breast: Secondary | ICD-10-CM

## 2016-08-30 ENCOUNTER — Other Ambulatory Visit: Payer: Self-pay | Admitting: General Surgery

## 2016-08-30 DIAGNOSIS — N6452 Nipple discharge: Secondary | ICD-10-CM

## 2016-09-05 ENCOUNTER — Ambulatory Visit
Admission: RE | Admit: 2016-09-05 | Discharge: 2016-09-05 | Disposition: A | Discharge status: Home / Self Care | Payer: BC Managed Care – PPO | Source: Ambulatory Visit | Attending: General Surgery | Admitting: General Surgery

## 2016-09-05 DIAGNOSIS — N6452 Nipple discharge: Secondary | ICD-10-CM

## 2016-09-05 MED ORDER — GADOBENATE DIMEGLUMINE 529 MG/ML IV SOLN
20.0000 mL | Freq: Once | INTRAVENOUS | Status: AC | PRN
  Administered 2016-09-05: 20 mL via INTRAVENOUS

## 2016-12-20 ENCOUNTER — Ambulatory Visit (INDEPENDENT_AMBULATORY_CARE_PROVIDER_SITE_OTHER): Payer: BC Managed Care – PPO | Admitting: Family Medicine

## 2016-12-20 VITALS — BP 108/70 | HR 110 | Temp 101.0°F | Wt 210.0 lb

## 2016-12-20 DIAGNOSIS — J111 Influenza due to unidentified influenza virus with other respiratory manifestations: Secondary | ICD-10-CM | POA: Diagnosis not present

## 2016-12-20 LAB — POC INFLUENZA A&B (BINAX/QUICKVUE)
INFLUENZA B, POC: NEGATIVE
Influenza A, POC: NEGATIVE

## 2016-12-20 MED ORDER — OSELTAMIVIR PHOSPHATE 75 MG PO CAPS: 75.0000 mg | ORAL_CAPSULE | Freq: Two times a day (BID) | ORAL | 0 refills | Status: DC

## 2016-12-20 MED ORDER — PROMETHAZINE-DM 6.25-15 MG/5ML PO SYRP: 5.0000 mL | ORAL_SOLUTION | Freq: Four times a day (QID) | ORAL | 0 refills | Status: DC | PRN

## 2016-12-20 NOTE — Progress Notes (Signed)
SUBJECTIVE:  [name] is a 41 y.o. female who present complaining of flu-like symptoms: fevers, chills, myalgias, congestion, sore throat and cough for 2 days. Denies dyspnea or wheezing.  Current Outpatient Prescriptions on File Prior to Visit  Medication Sig Dispense Refill  . albuterol (PROAIR HFA) 108 (90 BASE) MCG/ACT inhaler Inhale 2 puffs into the lungs every 6 (six) hours as needed for wheezing.    Marland Kitchen azithromycin (ZITHROMAX) 250 MG tablet 2 tabs by mouth on day 1 followed by 1 tab by mouth daily days 2-5 6 tablet 0  . buPROPion (WELLBUTRIN XL) 300 MG 24 hr tablet Take 1 tablet (300 mg total) by mouth daily. 90 tablet 3  . EPINEPHrine (EPIPEN) 0.3 mg/0.3 mL DEVI Inject 0.3 mLs (0.3 mg total) into the muscle once. 1 Device 0  . fexofenadine-pseudoephedrine (ALLEGRA-D 24) 180-240 MG per 24 hr tablet Take 1 tablet by mouth daily.    . fluticasone (FLONASE) 50 MCG/ACT nasal spray Place into both nostrils daily.    . Multiple Vitamin (MULTIVITAMIN PO) Take by mouth daily.       No current facility-administered medications on file prior to visit.     Allergies  Allergen Reactions  . Septra Ds [Sulfamethoxazole-Trimethoprim] Anaphylaxis    Hives, SOB, itching  . Augmentin [Amoxicillin-Pot Clavulanate]   . Codeine     REACTION: rash  . Milk-Related Compounds   . Tree Extract     Past Medical History:  Diagnosis Date  . Anemia   . Arthritis   . Asthma    EXERCISE INDUCED  . Bursitis of hip   . Depression   . Generalized headaches   . Irregular menses   . Obesity (BMI 30-39.9)     Past Surgical History:  Procedure Laterality Date  . ABDOMINAL HYSTERECTOMY    . ACHILLES TENDON LENGTHENING  1989  . ANKLE GANGLION CYST EXCISION  02/2012   left ankle  . FEET SURGERY  1990   INSERTION OF BONE GRAFT IN FEET  . LAPAROSCOPIC GASTRIC BANDING  10/17/10   Dr Gaynelle Adu; [facility] Standard  . LIPOMA EXCISION  02/2006   abdomen    Family History  Problem Relation Age of Onset   . Heart disease Maternal Grandmother     V. Fib arrest  . Hypertension Mother   . Hyperlipidemia Mother   . Hypertension Father   . Hyperlipidemia Father   . Heart disease Father   . Diabetes Father   . Cancer Father     skin  . Diabetes Sister   . Hypertension Sister   . Cancer Maternal Grandfather     lung and skin  . Cancer Paternal Grandfather     stomach and skin    Social History   Social History  . Marital status: Married    Spouse name: N/A  . Number of children: N/A  . Years of education: N/A   Occupational History  . Not on file.   Social History Main Topics  . Smoking status: Never Smoker  . Smokeless tobacco: Never Used  . Alcohol use No  . Drug use: No  . Sexual activity: Not on file   Other Topics Concern  . Not on file   Social History Narrative  . No narrative on file   The PMH, PSH, Social History, Family History, Medications, and allergies have been reviewed in [facility], and have been updated if relevant.  OBJECTIVE: BP 108/70 (BP Location: Left Arm, Patient Position: Sitting, Cuff Size: Large)  Pulse (!) 110   Temp (!) 101 F (38.3 C) (Oral)   Wt 210 lb (95.3 kg)   LMP 11/07/2012   SpO2 96%   BMI 34.95 kg/m   Appears moderately ill but not toxic; temperature as noted in vitals. Ears normal. Throat and pharynx normal.  Neck supple. No adenopathy in the neck. Sinuses non tender. The chest is clear.  ASSESSMENT: Influenza  PLAN: Tamiflu 75 mg twice daily x 5 days.  Symptomatic therapy suggested: call prn if symptoms persist or worsen. Call or return to clinic prn if these symptoms worsen or fail to improve as anticipated.

## 2016-12-20 NOTE — Progress Notes (Signed)
Pre visit review using our clinic review tool, if applicable. No additional management support is needed unless otherwise documented below in the visit note. 

## 2016-12-20 NOTE — Addendum Note (Signed)
Addended by: Eual FinesBRIDGES, Irem Stoneham P on: 12/20/2016 11:07 AM   Modules accepted: Orders

## 2016-12-20 NOTE — Patient Instructions (Signed)

## 2017-05-02 ENCOUNTER — Other Ambulatory Visit: Payer: Self-pay | Admitting: Family Medicine

## 2017-05-02 NOTE — Telephone Encounter (Signed)
Ok to refill one time only.  Needs OV for further refills. 

## 2017-05-02 NOTE — Telephone Encounter (Signed)
Last refill 04/23/16 #90 +3  Last OV 04/23/16 Ok to refill?

## 2017-05-16 ENCOUNTER — Encounter (HOSPITAL_COMMUNITY): Payer: Self-pay

## 2017-05-27 ENCOUNTER — Telehealth (HOSPITAL_COMMUNITY): Payer: Self-pay

## 2017-05-27 NOTE — Telephone Encounter (Signed)
This patient is overdue for recommended follow-up with a bariatric surgeon at [facility]. An e-mail was sent and a letter was mailed to the address on file 05/16/17 from both [facility] & [facility] in attempt to reestablish post-op care. The letter included a patient survey which was returned to the [facility] recently.  Patient declined an appointment advising does not see need for regular visits, distance too far to drive, & high copays required to be seen at [facility].  Copy of the survey was shared with Dario GuardianFrances Jackson at [facility] so she may file in patients office chart.

## 2017-11-19 ENCOUNTER — Other Ambulatory Visit: Payer: Self-pay | Admitting: General Surgery

## 2017-11-19 DIAGNOSIS — R1110 Vomiting, unspecified: Secondary | ICD-10-CM

## 2017-11-26 ENCOUNTER — Ambulatory Visit
Admission: RE | Admit: 2017-11-26 | Discharge: 2017-11-26 | Disposition: A | Discharge status: Home / Self Care | Payer: BC Managed Care – PPO | Source: Ambulatory Visit | Attending: General Surgery | Admitting: General Surgery

## 2017-11-26 ENCOUNTER — Other Ambulatory Visit: Payer: Self-pay | Admitting: General Surgery

## 2017-11-26 DIAGNOSIS — R1110 Vomiting, unspecified: Secondary | ICD-10-CM

## 2017-12-27 DIAGNOSIS — M5126 Other intervertebral disc displacement, lumbar region: Secondary | ICD-10-CM | POA: Insufficient documentation

## 2018-01-09 ENCOUNTER — Emergency Department
Admission: EM | Admit: 2018-01-09 | Discharge: 2018-01-09 | Disposition: A | Discharge status: Home / Self Care | Payer: BC Managed Care – PPO | Attending: Student in an Organized Health Care Education/Training Program | Admitting: Student in an Organized Health Care Education/Training Program

## 2018-01-09 DIAGNOSIS — Z79899 Other long term (current) drug therapy: Secondary | ICD-10-CM | POA: Diagnosis not present

## 2018-01-09 DIAGNOSIS — R51 Headache: Secondary | ICD-10-CM | POA: Insufficient documentation

## 2018-01-09 DIAGNOSIS — D509 Iron deficiency anemia, unspecified: Secondary | ICD-10-CM | POA: Insufficient documentation

## 2018-01-09 DIAGNOSIS — R55 Syncope and collapse: Secondary | ICD-10-CM

## 2018-01-09 LAB — URINALYSIS, COMPLETE (UACMP) WITH MICROSCOPIC
Bacteria, UA: NONE SEEN
Bilirubin Urine: NEGATIVE
GLUCOSE, UA: NEGATIVE mg/dL
HGB URINE DIPSTICK: NEGATIVE
Ketones, ur: NEGATIVE mg/dL
LEUKOCYTES UA: NEGATIVE
Nitrite: NEGATIVE
Protein, ur: NEGATIVE mg/dL
Specific Gravity, Urine: 1.003 — ABNORMAL LOW (ref 1.005–1.030)
pH: 8 (ref 5.0–8.0)

## 2018-01-09 LAB — CBC
HCT: 31.5 % — ABNORMAL LOW (ref 35.0–47.0)
HEMOGLOBIN: 10.1 g/dL — AB (ref 12.0–16.0)
MCH: 23.5 pg — AB (ref 26.0–34.0)
MCHC: 32.2 g/dL (ref 32.0–36.0)
MCV: 72.9 fL — AB (ref 80.0–100.0)
PLATELETS: 340 10*3/uL (ref 150–440)
RBC: 4.32 MIL/uL (ref 3.80–5.20)
RDW: 16.5 % — ABNORMAL HIGH (ref 11.5–14.5)
WBC: 9.6 10*3/uL (ref 3.6–11.0)

## 2018-01-09 LAB — BASIC METABOLIC PANEL
ANION GAP: 6 (ref 5–15)
BUN: 10 mg/dL (ref 6–20)
CALCIUM: 8.5 mg/dL — AB (ref 8.9–10.3)
CHLORIDE: 104 mmol/L (ref 101–111)
CO2: 27 mmol/L (ref 22–32)
CREATININE: 0.81 mg/dL (ref 0.44–1.00)
GFR calc Af Amer: >60 mL/min (ref >60)
GFR calc non Af Amer: >60 mL/min (ref >60)
Glucose, Bld: 99 mg/dL (ref 65–99)
Potassium: 3.7 mmol/L (ref 3.5–5.1)
SODIUM: 137 mmol/L (ref 135–145)

## 2018-01-09 LAB — TROPONIN I: Troponin I: <0.03 ng/mL (ref <0.03)

## 2018-01-09 LAB — GLUCOSE, CAPILLARY: Glucose-Capillary: 96 mg/dL (ref 65–99)

## 2018-01-09 LAB — POCT PREGNANCY, URINE: Preg Test, Ur: NEGATIVE

## 2018-01-09 MED ORDER — IRON 142 (45 FE) MG PO TBCR: 2.0000 | EXTENDED_RELEASE_TABLET | ORAL | 1 refills | Status: DC

## 2018-01-09 NOTE — ED Triage Notes (Addendum)
Patient presents to the ED with near-syncopal episode that occurred around 11:30am. Patient states she bent down to pick up a pencil and then fell.  Patient reports not losing consciousness but feeling very light headed.  Patient reports falling on her right knee.   Patient reports feeling dizzy somewhat all day and having, "difficulty processing."  Patient also reports having a headache.  [facility].

## 2018-01-09 NOTE — ED Notes (Signed)
RN attempted to be informed that patient has been roomed.  RN unavailable at this time and Diplomatic Services operational officersecretary is to Engineer, agriculturalinform RN.

## 2018-01-09 NOTE — ED Provider Notes (Signed)
[facility] Emergency Department Provider Note    First MD Initiated Contact with Patient 01/09/18 1715     (approximate)  I have reviewed the triage vital signs and the nursing notes.   HISTORY  Chief Complaint Near Syncope and Headache    HPI [name] is a 42 y.o. female with a history of anemia as well as asthma presents after near syncopal episode that occurred around 1130 today.  Patient states that she had not had much to eat this morning other than just the right scripts be snack and had not had much to drink.  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  The patient states that she bent down to pick up the pencil felt lightheaded with blurry vision and then fell down to the ground hitting her right knee.  Did not hit her head.  There was no loss of consciousness but did feel lightheaded.  She was then evaluated by the school nurse who noted that she was hypertensive and was worried that she was also dehydrated and she encouraged her to drink plenty of fluids and recommended that she come to the ER.  The patient continued about her day and so she finished with some meetings and then went to the ER for evaluation.  She denies any chest pain.  No numbness or tingling.  States that she has a mild headache but has a history of migraines and this is not a severe headache.  Certainly no thunderclap or sudden onset headache.  No blurry vision.  Denies any melena or hematochezia.  Past Medical History:  Diagnosis Date  . Anemia   . Arthritis   . Asthma    EXERCISE INDUCED  . Bursitis of hip   . Depression   . Generalized headaches   . Irregular menses   . Obesity (BMI 30-39.9)    Family History  Problem Relation Age of Onset  . Heart disease Maternal Grandmother        V. Fib arrest  . Hypertension Mother   . Hyperlipidemia Mother   . Hypertension Father   .  Hyperlipidemia Father   . Heart disease Father   . Diabetes Father   . Cancer Father        skin  . Diabetes Sister   . Hypertension Sister   . Cancer Maternal Grandfather        lung and skin  . Cancer Paternal Grandfather        stomach and skin   Past Surgical History:  Procedure Laterality Date  . ABDOMINAL HYSTERECTOMY    . ACHILLES TENDON LENGTHENING  1989  . ANKLE GANGLION CYST EXCISION  02/2012   left ankle  . FEET SURGERY  1990   INSERTION OF BONE GRAFT IN FEET  . LAPAROSCOPIC GASTRIC BANDING  10/17/10   Dr Gaynelle Adu; AP Standard  . LIPOMA EXCISION  02/2006   abdomen   Patient Active Problem List   Diagnosis Date Noted  . Acute ear pain 04/23/2016  . H/O laparoscopic adjustable gastric banding 07/29/2013  . Bilateral ovarian cysts 11/13/2012  . Depression 01/22/2011  . Decreased sex drive 16/07/9603  . Morbid obesity (HCC) 02/13/2010  . OTHER MALAISE AND FATIGUE 12/20/2008      Prior to Admission medications   Medication Sig Start Date End Date Taking? Authorizing Provider  albuterol (PROAIR HFA) 108 (90  BASE) MCG/ACT inhaler Inhale 2 puffs into the lungs every 6 (six) hours as needed for wheezing.    [provider]  azithromycin (ZITHROMAX) 250 MG tablet 2 tabs by mouth on day 1 followed by 1 tab by mouth daily days 2-5 04/23/16   Dianne DunAron, Talia M, MD  buPROPion (WELLBUTRIN XL) 300 MG 24 hr tablet Take 1 tablet (300 mg total) by mouth daily. 04/23/16   Dianne DunAron, Talia M, MD  buPROPion (WELLBUTRIN XL) 300 MG 24 hr tablet TAKE 1 TABLET(300 MG) BY MOUTH DAILY 05/02/17   Dianne DunAron, Talia M, MD  EPINEPHrine (EPIPEN) 0.3 mg/0.3 mL DEVI Inject 0.3 mLs (0.3 mg total) into the muscle once. 12/10/11   Dianne DunAron, Talia M, MD  Ferrous Sulfate (IRON) 142 (45 Fe) MG TBCR Take 2 tablets by mouth every other day. 01/09/18   Willy Eddyobinson, Norberto Wishon, MD  fexofenadine-pseudoephedrine (ALLEGRA-D 24) 180-240 MG per 24 hr tablet Take 1 tablet by mouth daily.    [provider]  fluticasone  (FLONASE) 50 MCG/ACT nasal spray Place into both nostrils daily.    [provider]  Multiple Vitamin (MULTIVITAMIN PO) Take by mouth daily.      [provider]  oseltamivir (TAMIFLU) 75 MG capsule Take 1 capsule (75 mg total) by mouth 2 (two) times daily. 12/20/16   Dianne DunAron, Talia M, MD  promethazine-dextromethorphan (PROMETHAZINE-DM) 6.25-15 MG/5ML syrup Take 5 mLs by mouth 4 (four) times daily as needed for cough. 12/20/16   Dianne DunAron, Talia M, MD    Allergies Septra ds [sulfamethoxazole-trimethoprim]; Augmentin [amoxicillin-pot clavulanate]; Codeine; Milk-related compounds; and Tree extract    Social History Social History   Tobacco Use  . Smoking status: Never Smoker  . Smokeless tobacco: Never Used  Substance Use Topics  . Alcohol use: No  . Drug use: No    Review of Systems Patient denies headaches, rhinorrhea, blurry vision, numbness, shortness of breath, chest pain, edema, cough, abdominal pain, nausea, vomiting, diarrhea, dysuria, fevers, rashes or hallucinations unless otherwise stated above in HPI. ____________________________________________   PHYSICAL EXAM:  VITAL SIGNS: Vitals:   01/09/18 1641  BP: (!) 151/87  Pulse: 81  Resp: 16  Temp: 98.7 F (37.1 C)  SpO2: 100%    Constitutional: Alert and oriented. Well appearing and in no acute distress. Eyes: Conjunctivae are normal.  Head: Atraumatic. Nose: No congestion/rhinnorhea. Mouth/Throat: Mucous membranes are moist.   Neck: No stridor. Painless ROM.  Cardiovascular: Normal rate, regular rhythm. Grossly normal heart sounds.  Good peripheral circulation. Respiratory: Normal respiratory effort.  No retractions. Lungs CTAB. Gastrointestinal: Soft and nontender. No distention. No abdominal bruits. No CVA tenderness. Genitourinary:  Musculoskeletal: No lower extremity tenderness nor edema.  No joint effusions. Neurologic:  CN- intact.  No facial droop, Normal FNF.  Normal heel to shin.  Sensation  intact bilaterally. Normal speech and language. No gross focal neurologic deficits are appreciated. No gait instability. Skin:  Skin is warm, dry and intact. No rash noted. Psychiatric: Mood and affect are normal. Speech and behavior are normal.  ____________________________________________   LABS (all labs ordered are listed, but only abnormal results are displayed)  Results for orders placed or performed during the hospital encounter of 01/09/18 (from the past 24 hour(s))  Basic metabolic panel     Status: Abnormal   Collection Time: 01/09/18  4:51 PM  Result Value Ref Range   Sodium 137 135 - 145 mmol/L   Potassium 3.7 3.5 - 5.1 mmol/L   Chloride 104 101 - 111 mmol/L  CO2 27 22 - 32 mmol/L   Glucose, Bld 99 65 - 99 mg/dL   BUN 10 6 - 20 mg/dL   Creatinine, Ser 1.61 0.44 - 1.00 mg/dL   Calcium 8.5 (L) 8.9 - 10.3 mg/dL   GFR calc non Af Amer >60 >60 mL/min   GFR calc Af Amer >60 >60 mL/min   Anion gap 6 5 - 15  CBC     Status: Abnormal   Collection Time: 01/09/18  4:51 PM  Result Value Ref Range   WBC 9.6 3.6 - 11.0 K/uL   RBC 4.32 3.80 - 5.20 MIL/uL   Hemoglobin 10.1 (L) 12.0 - 16.0 g/dL   HCT 09.6 (L) 04.5 - 40.9 %   MCV 72.9 (L) 80.0 - 100.0 fL   MCH 23.5 (L) 26.0 - 34.0 pg   MCHC 32.2 32.0 - 36.0 g/dL   RDW 81.1 (H) 91.4 - 78.2 %   Platelets 340 150 - 440 K/uL  Urinalysis, Complete w Microscopic     Status: Abnormal   Collection Time: 01/09/18  4:51 PM  Result Value Ref Range   Color, Urine STRAW (A) YELLOW   APPearance CLEAR (A) CLEAR   Specific Gravity, Urine 1.003 (L) 1.005 - 1.030   pH 8.0 5.0 - 8.0   Glucose, UA NEGATIVE NEGATIVE mg/dL   Hgb urine dipstick NEGATIVE NEGATIVE   Bilirubin Urine NEGATIVE NEGATIVE   Ketones, ur NEGATIVE NEGATIVE mg/dL   Protein, ur NEGATIVE NEGATIVE mg/dL   Nitrite NEGATIVE NEGATIVE   Leukocytes, UA NEGATIVE NEGATIVE   RBC / HPF 0-5 0 - 5 RBC/hpf   WBC, UA 0-5 0 - 5 WBC/hpf   Bacteria, UA NONE SEEN NONE SEEN   Squamous  Epithelial / LPF 0-5 (A) NONE SEEN  Troponin I     Status: None   Collection Time: 01/09/18  4:51 PM  Result Value Ref Range   Troponin I <0.03 <0.03 ng/mL  Glucose, capillary     Status: None   Collection Time: 01/09/18  4:56 PM  Result Value Ref Range   Glucose-Capillary 96 65 - 99 mg/dL  Pregnancy, urine POC     Status: None   Collection Time: 01/09/18  5:06 PM  Result Value Ref Range   Preg Test, Ur NEGATIVE NEGATIVE   ____________________________________________  EKG My review and personal interpretation at Time: 16:48   Indication: near syncope  Rate: 75  Rhythm: sinus Axis: normal Other: normal intervals, no stemi ____________________________________________  RADIOLOGY   ____________________________________________   PROCEDURES  Procedure(s) performed:  Procedures    Critical Care performed: no ____________________________________________   INITIAL IMPRESSION / ASSESSMENT AND PLAN / ED COURSE  Pertinent labs & imaging results that were available during my care of the patient were reviewed by me and considered in my medical decision making (see chart for details).  DDX: Dehydration, electrolyte abnormality, dysrhythmia, subarachnoid, orthostasis, vasovagal  SAVANHA ISLAND is a 42 y.o. who presents to the ED with  Symptoms as described above.  Patient currently well-appearing and in no acute distress.  Neuro exam is nonfocal.  Not clinically consistent with subarachnoid she has no evidence of any meningeal irritation.  No evidence of preexcitation syndrome or abnormal interval on her EKG.  Her troponin is negative. patient denies any melena or hematochezia does have evidence of iron deficiency anemia and states that she has had similar symptoms to this in the past when her iron levels get low.  Patient is tolerating oral hydration.  I will  give her a prescription for iron supplementation discussed signs symptoms for which she should return to the hospital.      ____________________________________________   FINAL CLINICAL IMPRESSION(S) / ED DIAGNOSES  Final diagnoses:  Iron deficiency anemia, unspecified iron deficiency anemia type  Near syncope      NEW MEDICATIONS STARTED DURING THIS VISIT:  New Prescriptions   FERROUS SULFATE (IRON) 142 (45 FE) MG TBCR    Take 2 tablets by mouth every other day.     Note:  This document was prepared using Dragon voice recognition software and may include unintentional dictation errors.    Willy Eddy, MD 01/09/18 [phone]

## 2018-01-09 NOTE — Discharge Instructions (Signed)
Is a pleasure taking care of you today.  I will give you prescription for iron supplementation that I do recommend you taking once every other day to help with absorption.  Please be sure to drink plenty of fluids and make sure that you are having a good breakfast before going to work.  Return if you have any additional questions or concerns.

## 2018-01-13 ENCOUNTER — Ambulatory Visit: Payer: BC Managed Care – PPO | Admitting: Family Medicine

## 2018-01-13 VITALS — BP 118/78 | HR 85 | Temp 98.5°F | Ht 65.0 in | Wt 222.0 lb

## 2018-01-13 DIAGNOSIS — M549 Dorsalgia, unspecified: Secondary | ICD-10-CM | POA: Insufficient documentation

## 2018-01-13 DIAGNOSIS — Z23 Encounter for immunization: Secondary | ICD-10-CM | POA: Diagnosis not present

## 2018-01-13 DIAGNOSIS — D6489 Other specified anemias: Secondary | ICD-10-CM | POA: Diagnosis not present

## 2018-01-13 DIAGNOSIS — D649 Anemia, unspecified: Secondary | ICD-10-CM | POA: Insufficient documentation

## 2018-01-13 DIAGNOSIS — M545 Low back pain: Secondary | ICD-10-CM

## 2018-01-13 LAB — CBC
HEMATOCRIT: 32.8 % — AB (ref 36.0–46.0)
Hemoglobin: 10.4 g/dL — ABNORMAL LOW (ref 12.0–15.0)
MCHC: 31.6 g/dL (ref 30.0–36.0)
MCV: 75.1 fl — AB (ref 78.0–100.0)
Platelets: 382 10*3/uL (ref 150.0–400.0)
RBC: 4.37 Mil/uL (ref 3.87–5.11)
RDW: 16.5 % — AB (ref 11.5–15.5)
WBC: 7.5 10*3/uL (ref 4.0–10.5)

## 2018-01-13 LAB — FERRITIN: Ferritin: 7 ng/mL — ABNORMAL LOW (ref 10.0–291.0)

## 2018-01-13 NOTE — Progress Notes (Signed)
Subjective:   Patient ID: [name], female    DOB: [birth year], 42 y.o.   MRN: [id number]  [name] is a pleasant 42 y.o. year old female who presents to clinic today with Hospitalization Follow-up (Patient is here today to F/U after hospital visit on 3.14.19 at [facility] for Fe def anemia, near syncope and headaches.  They gave her an Fe rich diet sheet to start upon release.  ) and Back Pain (Patient is also here to discuss her back pain.  She sees Dr. 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  She had an Epidural inj on 2.16.19 with severe reaction.  Not tolerate prednisone.  They have also given shot of tramadol and gave 10d of Ketoralac 10mg .  Takes Gabapentin 300mg  1qhs but she couldn't take last night since she knew she would be driving today. )  on 02/04/8118  HPI:  ER followed up- was seen at [facility] ED on 01/09/18 for Near syncope and headache.  Note reviewed.  Final assessment:  DDX: Dehydration, electrolyte abnormality, dysrhythmia, subarachnoid, orthostasis, vasovagal  [name] is a 42 y.o. who presents to the ED with  Symptoms as described above.  Patient currently well-appearing and in no acute distress.  Neuro exam is nonfocal.  Not clinically consistent with subarachnoid she has no evidence of any meningeal irritation.  No evidence of preexcitation syndrome or abnormal interval on her EKG.  Her troponin is negative. patient denies any melena or hematochezia does have evidence of iron deficiency anemia and states that she has had similar symptoms to this in the past when her iron levels get low.  Patient is tolerating oral hydration.  I will give her a prescription for iron supplementation discussed signs symptoms for which she should return to the hospital.  Has been followed by Dr. Andrey Campanile- had a DG UGI- had fluid removed from lap and has had iron infusions in the past. Just started oral iron  supplementation a few days ago.  Lab Results  Component Value Date   WBC 9.6 01/09/2018   HGB 10.1 (L) 01/09/2018   HCT 31.5 (L) 01/09/2018   MCV 72.9 (L) 01/09/2018   PLT 340 01/09/2018     Current Outpatient Medications on File Prior to Visit  Medication Sig Dispense Refill  . albuterol (PROAIR HFA) 108 (90 BASE) MCG/ACT inhaler Inhale 2 puffs into the lungs every 6 (six) hours as needed for wheezing.    Marland Kitchen buPROPion (WELLBUTRIN XL) 150 MG 24 hr tablet Take 150 mg by mouth daily. Take 2qd (cannot swallow 300mg  tablets)    . EPINEPHrine (EPIPEN) 0.3 mg/0.3 mL DEVI Inject 0.3 mLs (0.3 mg total) into the muscle once. 1 Device 0  . Ferrous Sulfate (IRON) 142 (45 Fe) MG TBCR Take 2 tablets by mouth every other day. 30 tablet 1  . fexofenadine-pseudoephedrine (ALLEGRA-D 24) 180-240 MG per 24 hr tablet Take 1 tablet by mouth daily.    . fluticasone (FLONASE) 50 MCG/ACT nasal spray Place into both nostrils daily.    Marland Kitchen gabapentin (NEURONTIN) 300 MG capsule Take 1-2qhs prn    . pantoprazole (PROTONIX) 40 MG tablet Take 1 tablet by mouth daily.     No current facility-administered medications on file prior to visit.     Allergies  Allergen Reactions  . Brassica Oleracea Italica Anaphylaxis  . Cashew Nut Oil Anaphylaxis  . Septra Ds [Sulfamethoxazole-Trimethoprim] Anaphylaxis  Hives, SOB, itching  . Whey Anaphylaxis  . Citrus Other (See Comments)  . Augmentin [Amoxicillin-Pot Clavulanate]   . Codeine     REACTION: rash  . Milk-Related Compounds   . Tree Extract     Past Medical History:  Diagnosis Date  . Anemia   . Arthritis   . Asthma    EXERCISE INDUCED  . Bursitis of hip   . Depression   . Generalized headaches   . Irregular menses   . Obesity (BMI 30-39.9)     Past Surgical History:  Procedure Laterality Date  . ABDOMINAL HYSTERECTOMY    . ACHILLES TENDON LENGTHENING  1989  . ANKLE GANGLION CYST EXCISION  02/2012   left ankle  . FEET SURGERY  1990   INSERTION  OF BONE GRAFT IN FEET  . LAPAROSCOPIC GASTRIC BANDING  10/17/10   Dr Gaynelle Adu; AP Standard  . LIPOMA EXCISION  02/2006   abdomen    Family History  Problem Relation Age of Onset  . Heart disease Maternal Grandmother        V. Fib arrest  . Hypertension Mother   . Hyperlipidemia Mother   . Hypertension Father   . Hyperlipidemia Father   . Heart disease Father   . Diabetes Father   . Cancer Father        skin  . Diabetes Sister   . Hypertension Sister   . Cancer Maternal Grandfather        lung and skin  . Cancer Paternal Grandfather        stomach and skin    Social History   Socioeconomic History  . Marital status: Married    Spouse name: Not on file  . Number of children: Not on file  . Years of education: Not on file  . Highest education level: Not on file  Social Needs  . Financial resource strain: Not on file  . Food insecurity - worry: Not on file  . Food insecurity - inability: Not on file  . Transportation needs - medical: Not on file  . Transportation needs - non-medical: Not on file  Occupational History  . Not on file  Tobacco Use  . Smoking status: Never Smoker  . Smokeless tobacco: Never Used  Substance and Sexual Activity  . Alcohol use: No  . Drug use: No  . Sexual activity: Not on file  Other Topics Concern  . Not on file  Social History Narrative  . Not on file   The PMH, PSH, Social History, Family History, Medications, and allergies have been reviewed in [facility], and have been updated if relevant.   Review of Systems  Constitutional: Positive for fatigue.  Gastrointestinal: Negative.   All other systems reviewed and are negative.      Objective:    BP 118/78 (BP Location: Right Arm, Patient Position: Sitting, Cuff Size: Normal)   Pulse 85   Temp 98.5 F (36.9 C) (Oral)   Ht 5\' 5"  (1.651 m)   Wt 222 lb (100.7 kg)   LMP 11/07/2012   SpO2 99%   BMI 36.94 kg/m    Physical Exam   General:  Well-developed,well-nourished,in no  acute distress; alert,appropriate and cooperative throughout examination Head:  normocephalic and atraumatic.   Eyes:  vision grossly intact, PERRL Ears:  R ear normal and L ear normal externally, TMs clear bilaterally Nose:  no external deformity.   Mouth:  good dentition.   Neck:  No deformities, masses, or tenderness noted. Breasts:  No mass, nodules, thickening, tenderness, bulging, retraction, inflamation, nipple discharge or skin changes noted.   Lungs:  Normal respiratory effort, chest expands symmetrically. Lungs are clear to auscultation, no crackles or wheezes. Heart:  Normal rate and regular rhythm. S1 and S2 normal without gallop, murmur, click, rub or other extra sounds. Abdomen:  Bowel sounds positive,abdomen soft and non-tender without masses, organomegaly or hernias noted. Msk:  No deformity or scoliosis noted of thoracic or lumbar spine.   Extremities:  No clubbing, cyanosis, edema, or deformity noted with normal full range of motion of all joints.   Neurologic:  alert & oriented X3 and gait normal.   Skin:  Intact without suspicious lesions or rashes Cervical Nodes:  No lymphadenopathy noted Axillary Nodes:  No palpable lymphadenopathy Psych:  Cognition and judgment appear intact. Alert and cooperative with normal attention span and concentration. No apparent delusions, illusions, hallucinations'      Assessment & Plan:   Need for diphtheria-tetanus-pertussis (Tdap) vaccine - Plan: Tdap vaccine greater than or equal to 7yo IM  Low back pain, unspecified back pain laterality, unspecified chronicity, with sciatica presence unspecified No Follow-up on file.

## 2018-01-13 NOTE — Patient Instructions (Addendum)
Great to see you. I will call you with your lab results from today and you can view them online.   We are referring to a hematologist.

## 2018-01-13 NOTE — Assessment & Plan Note (Signed)
Deteriorated without any sources of bleeding. ?malaborption after bariatric surgery. Continue follow ups with Dr. Denyse AmassWillson. Check CBC and ferritin today. Refer to hematology, re: ?iron infusions.

## 2018-01-14 ENCOUNTER — Encounter: Payer: Self-pay | Admitting: Family Medicine

## 2018-01-15 ENCOUNTER — Encounter (HOSPITAL_COMMUNITY): Payer: Self-pay | Admitting: Emergency Medicine

## 2018-01-16 ENCOUNTER — Ambulatory Visit: Payer: Self-pay | Admitting: General Surgery

## 2018-01-27 ENCOUNTER — Encounter: Payer: Self-pay | Admitting: Family Medicine

## 2018-01-27 HISTORY — PX: LAPAROSCOPIC REPAIR AND REMOVAL OF GASTRIC BAND: SHX5919

## 2018-01-28 ENCOUNTER — Encounter (HOSPITAL_COMMUNITY): Payer: Self-pay | Admitting: *Deleted

## 2018-01-29 NOTE — Patient Instructions (Signed)
[name]  01/29/2018   Your procedure is scheduled on: 02-03-18   Report to [facility] Main  Entrance Report to Admitting at 11:45 AM   Call this number if you have problems the morning of surgery [phone]   Remember: Do not eat food or drink liquids :After Midnight. You may have a Clear Liquid Diet from Midnight until 7:45 AM, after 7:45 AM, nothing until after surgery     CLEAR LIQUID DIET   Foods Allowed                                                                     Foods Excluded  Coffee and tea, regular and decaf                             liquids that you cannot  Plain Jell-O in any flavor                                             see through such as: Fruit ices (not with fruit pulp)                                     milk, soups, orange juice  Iced Popsicles                                    All solid food Carbonated beverages, regular and diet                                    Cranberry, grape and apple juices Sports drinks like Gatorade Lightly seasoned clear broth or consume(fat free) Sugar, honey syrup  Sample Menu Breakfast                                Lunch                                     Supper Cranberry juice                    Beef broth                            Chicken broth Jell-O                                     Grape juice                           Apple juice  Coffee or tea                        Jell-O                                      Popsicle                                                Coffee or tea                        Coffee or tea  _____________________________________________________________________     Take these medicines the morning of surgery with A SIP OF WATER: Bupropion (Wellbutrin), and Pantoprazole (Protonix). You may bring and use your inhaler as needed.                                You may not have any metal on your body including hair pins and              piercings  Do  not wear jewelry, make-up, lotions, powders or perfumes, deodorant             Do not wear nail polish.  Do not shave  48 hours prior to surgery.                 Do not bring valuables to the hospital. [facility] IS NOT             RESPONSIBLE   FOR VALUABLES.  Contacts, dentures or bridgework may not be worn into surgery.  Leave suitcase in the car. After surgery it may be brought to your room.                   Please read over the following fact sheets you were given: _____________________________________________________________________           [facility] - Preparing for Surgery Before surgery, you can play an important role.  Because skin is not sterile, your skin needs to be as free of germs as possible.  You can reduce the number of germs on your skin by washing with CHG (chlorahexidine gluconate) soap before surgery.  CHG is an antiseptic cleaner which kills germs and bonds with the skin to continue killing germs even after washing. Please DO NOT use if you have an allergy to CHG or antibacterial soaps.  If your skin becomes reddened/irritated stop using the CHG and inform your nurse when you arrive at Short Stay. Do not shave (including legs and underarms) for at least 48 hours prior to the first CHG shower.  You may shave your face/neck. Please follow these instructions carefully:  1.  Shower with CHG Soap the night before surgery and the  morning of Surgery.  2.  If you choose to wash your hair, wash your hair first as usual with your  normal  shampoo.  3.  After you shampoo, rinse your hair and body thoroughly to remove the  shampoo.                           4.  Use CHG  as you would any other liquid soap.  You can apply chg directly  to the skin and wash                       Gently with a scrungie or clean washcloth.  5.  Apply the CHG Soap to your body ONLY FROM THE NECK DOWN.   Do not use on face/ open                           Wound or open sores. Avoid contact with  eyes, ears mouth and genitals (private parts).                       Wash face,  Genitals (private parts) with your normal soap.             6.  Wash thoroughly, paying special attention to the area where your surgery  will be performed.  7.  Thoroughly rinse your body with warm water from the neck down.  8.  DO NOT shower/wash with your normal soap after using and rinsing off  the CHG Soap.                9.  Pat yourself dry with a clean towel.            10.  Wear clean pajamas.            11.  Place clean sheets on your bed the night of your first shower and do not  sleep with pets. Day of Surgery : Do not apply any lotions/deodorants the morning of surgery.  Please wear clean clothes to the hospital/surgery center.  FAILURE TO FOLLOW THESE INSTRUCTIONS MAY RESULT IN THE CANCELLATION OF YOUR SURGERY PATIENT SIGNATURE_________________________________  NURSE SIGNATURE__________________________________  ________________________________________________________________________  WHAT IS A BLOOD TRANSFUSION? Blood Transfusion Information  A transfusion is the replacement of blood or some of its parts. Blood is made up of multiple cells which provide different functions.  Red blood cells carry oxygen and are used for blood loss replacement.  White blood cells fight against infection.  Platelets control bleeding.  Plasma helps clot blood.  Other blood products are available for specialized needs, such as hemophilia or other clotting disorders. BEFORE THE TRANSFUSION  Who gives blood for transfusions?   Healthy volunteers who are fully evaluated to make sure their blood is safe. This is blood bank blood. Transfusion therapy is the safest it has ever been in the practice of medicine. Before blood is taken from a donor, a complete history is taken to make sure that person has no history of diseases nor engages in risky social behavior (examples are intravenous drug use or sexual activity  with multiple partners). The donor's travel history is screened to minimize risk of transmitting infections, such as malaria. The donated blood is tested for signs of infectious diseases, such as HIV and hepatitis. The blood is then tested to be sure it is compatible with you in order to minimize the chance of a transfusion reaction. If you or a relative donates blood, this is often done in anticipation of surgery and is not appropriate for emergency situations. It takes many days to process the donated blood. RISKS AND COMPLICATIONS Although transfusion therapy is very safe and saves many lives, the main dangers of transfusion include:   Getting an infectious disease.  Developing a transfusion reaction. This is an allergic  reaction to something in the blood you were given. Every precaution is taken to prevent this. The decision to have a blood transfusion has been considered carefully by your caregiver before blood is given. Blood is not given unless the benefits outweigh the risks. AFTER THE TRANSFUSION  Right after receiving a blood transfusion, you will usually feel much better and more energetic. This is especially true if your red blood cells have gotten low (anemic). The transfusion raises the level of the red blood cells which carry oxygen, and this usually causes an energy increase.  The nurse administering the transfusion will monitor you carefully for complications. HOME CARE INSTRUCTIONS  No special instructions are needed after a transfusion. You may find your energy is better. Speak with your caregiver about any limitations on activity for underlying diseases you may have. SEEK MEDICAL CARE IF:   Your condition is not improving after your transfusion.  You develop redness or irritation at the intravenous (IV) site. SEEK IMMEDIATE MEDICAL CARE IF:  Any of the following symptoms occur over the next 12 hours:  Shaking chills.  You have a temperature by mouth above 102 F (38.9  C), not controlled by medicine.  Chest, back, or muscle pain.  People around you feel you are not acting correctly or are confused.  Shortness of breath or difficulty breathing.  Dizziness and fainting.  You get a rash or develop hives.  You have a decrease in urine output.  Your urine turns a dark color or changes to pink, red, or brown. Any of the following symptoms occur over the next 10 days:  You have a temperature by mouth above 102 F (38.9 C), not controlled by medicine.  Shortness of breath.  Weakness after normal activity.  The white part of the eye turns yellow (jaundice).  You have a decrease in the amount of urine or are urinating less often.  Your urine turns a dark color or changes to pink, red, or brown. Document Released: 10/12/2000 Document Revised: 01/07/2012 Document Reviewed: 05/31/2008 [facility] Patient Information 2014 [place], [place].  _______________________________________________________________________

## 2018-01-29 NOTE — Progress Notes (Signed)
01-09-18 (Epic) EKG, BMP

## 2018-01-30 ENCOUNTER — Other Ambulatory Visit: Payer: Self-pay

## 2018-01-30 ENCOUNTER — Encounter (HOSPITAL_COMMUNITY): Payer: Self-pay

## 2018-01-30 ENCOUNTER — Encounter (HOSPITAL_COMMUNITY)
Admission: RE | Admit: 2018-01-30 | Discharge: 2018-01-30 | Disposition: A | Discharge status: Home / Self Care | Payer: BC Managed Care – PPO | Source: Ambulatory Visit | Attending: General Surgery | Admitting: General Surgery

## 2018-01-30 DIAGNOSIS — Z01812 Encounter for preprocedural laboratory examination: Secondary | ICD-10-CM | POA: Diagnosis present

## 2018-01-30 DIAGNOSIS — K9509 Other complications of gastric band procedure: Secondary | ICD-10-CM | POA: Diagnosis not present

## 2018-01-30 LAB — CBC
HCT: 37.7 % (ref 36.0–46.0)
HEMOGLOBIN: 11.3 g/dL — AB (ref 12.0–15.0)
MCH: 24.1 pg — ABNORMAL LOW (ref 26.0–34.0)
MCHC: 30 g/dL (ref 30.0–36.0)
MCV: 80.4 fL (ref 78.0–100.0)
Platelets: 294 10*3/uL (ref 150–400)
RBC: 4.69 MIL/uL (ref 3.87–5.11)
RDW: 18.8 % — ABNORMAL HIGH (ref 11.5–15.5)
WBC: 3.8 10*3/uL — ABNORMAL LOW (ref 4.0–10.5)

## 2018-01-30 LAB — COMPREHENSIVE METABOLIC PANEL
ALK PHOS: 89 U/L (ref 38–126)
ALT: 15 U/L (ref 14–54)
AST: 17 U/L (ref 15–41)
Albumin: 3.8 g/dL (ref 3.5–5.0)
Anion gap: 6 (ref 5–15)
BUN: 7 mg/dL (ref 6–20)
CALCIUM: 8.8 mg/dL — AB (ref 8.9–10.3)
CO2: 28 mmol/L (ref 22–32)
Chloride: 105 mmol/L (ref 101–111)
Creatinine, Ser: 0.9 mg/dL (ref 0.44–1.00)
GFR calc Af Amer: >60 mL/min (ref >60)
GFR calc non Af Amer: >60 mL/min (ref >60)
GLUCOSE: 88 mg/dL (ref 65–99)
POTASSIUM: 4.3 mmol/L (ref 3.5–5.1)
Sodium: 139 mmol/L (ref 135–145)
TOTAL PROTEIN: 7 g/dL (ref 6.5–8.1)
Total Bilirubin: 0.6 mg/dL (ref 0.3–1.2)

## 2018-01-30 LAB — ABO/RH: ABO/RH(D): B NEG

## 2018-02-03 ENCOUNTER — Encounter (HOSPITAL_COMMUNITY)
Admission: RE | Disposition: A | Discharge status: Home / Self Care | Payer: Self-pay | Source: Ambulatory Visit | Attending: General Surgery

## 2018-02-03 ENCOUNTER — Other Ambulatory Visit: Payer: Self-pay

## 2018-02-03 ENCOUNTER — Ambulatory Visit (HOSPITAL_COMMUNITY): Payer: BC Managed Care – PPO | Admitting: Certified Registered Nurse Anesthetist

## 2018-02-03 ENCOUNTER — Ambulatory Visit (HOSPITAL_COMMUNITY): Payer: BC Managed Care – PPO | Admitting: Hematology

## 2018-02-03 ENCOUNTER — Encounter (HOSPITAL_COMMUNITY): Payer: Self-pay | Admitting: Emergency Medicine

## 2018-02-03 ENCOUNTER — Observation Stay (HOSPITAL_COMMUNITY)
Admission: RE | Admit: 2018-02-03 | Discharge: 2018-02-04 | Disposition: A | Discharge status: Home / Self Care | Payer: BC Managed Care – PPO | Source: Ambulatory Visit | Attending: General Surgery | Admitting: General Surgery

## 2018-02-03 DIAGNOSIS — Z79899 Other long term (current) drug therapy: Secondary | ICD-10-CM | POA: Diagnosis not present

## 2018-02-03 DIAGNOSIS — R1110 Vomiting, unspecified: Secondary | ICD-10-CM | POA: Diagnosis present

## 2018-02-03 DIAGNOSIS — Z9884 Bariatric surgery status: Secondary | ICD-10-CM

## 2018-02-03 DIAGNOSIS — K9509 Other complications of gastric band procedure: Secondary | ICD-10-CM | POA: Diagnosis not present

## 2018-02-03 DIAGNOSIS — E669 Obesity, unspecified: Secondary | ICD-10-CM | POA: Insufficient documentation

## 2018-02-03 DIAGNOSIS — F329 Major depressive disorder, single episode, unspecified: Secondary | ICD-10-CM | POA: Diagnosis not present

## 2018-02-03 DIAGNOSIS — D509 Iron deficiency anemia, unspecified: Secondary | ICD-10-CM | POA: Insufficient documentation

## 2018-02-03 DIAGNOSIS — Z6837 Body mass index (BMI) 37.0-37.9, adult: Secondary | ICD-10-CM | POA: Diagnosis not present

## 2018-02-03 HISTORY — DX: Bariatric surgery status: Z98.84

## 2018-02-03 LAB — TYPE AND SCREEN
ABO/RH(D): B NEG
Antibody Screen: NEGATIVE

## 2018-02-03 SURGERY — REMOVAL, GASTRIC BAND, LAPAROSCOPIC
Anesthesia: General | Site: Abdomen

## 2018-02-03 MED ORDER — LIDOCAINE 2% (20 MG/ML) 5 ML SYRINGE
INTRAMUSCULAR | Status: AC
  Filled 2018-02-03: qty 5

## 2018-02-03 MED ORDER — SIMETHICONE 80 MG PO CHEW: 40.0000 mg | CHEWABLE_TABLET | Freq: Four times a day (QID) | ORAL | Status: DC | PRN

## 2018-02-03 MED ORDER — GABAPENTIN 300 MG PO CAPS
300.0000 mg | ORAL_CAPSULE | Freq: Two times a day (BID) | ORAL | Status: DC
  Administered 2018-02-03: 300 mg via ORAL
  Filled 2018-02-03: qty 1

## 2018-02-03 MED ORDER — MEPERIDINE HCL 50 MG/ML IJ SOLN: 6.2500 mg | INTRAMUSCULAR | Status: DC | PRN

## 2018-02-03 MED ORDER — FENTANYL CITRATE (PF) 100 MCG/2ML IJ SOLN
INTRAMUSCULAR | Status: DC | PRN
  Administered 2018-02-03: 100 ug via INTRAVENOUS
  Administered 2018-02-03 (×3): 50 ug via INTRAVENOUS

## 2018-02-03 MED ORDER — LACTATED RINGERS IV SOLN
INTRAVENOUS | Status: DC
  Administered 2018-02-03 (×2): via INTRAVENOUS

## 2018-02-03 MED ORDER — HYDROMORPHONE HCL 1 MG/ML IJ SOLN: 1.0000 mg | INTRAMUSCULAR | Status: DC | PRN

## 2018-02-03 MED ORDER — PROMETHAZINE HCL 25 MG/ML IJ SOLN
INTRAMUSCULAR | Status: AC
  Filled 2018-02-03: qty 1

## 2018-02-03 MED ORDER — ACETAMINOPHEN 160 MG/5ML PO SOLN
650.0000 mg | Freq: Four times a day (QID) | ORAL | Status: DC
  Administered 2018-02-03 – 2018-02-04 (×3): 650 mg via ORAL
  Filled 2018-02-03 (×3): qty 20.3

## 2018-02-03 MED ORDER — SUCCINYLCHOLINE CHLORIDE 200 MG/10ML IV SOSY
PREFILLED_SYRINGE | INTRAVENOUS | Status: AC
  Filled 2018-02-03: qty 10

## 2018-02-03 MED ORDER — SODIUM CHLORIDE 0.9 % IJ SOLN
INTRAMUSCULAR | Status: AC
  Filled 2018-02-03: qty 50

## 2018-02-03 MED ORDER — ENOXAPARIN SODIUM 40 MG/0.4ML ~~LOC~~ SOLN
40.0000 mg | SUBCUTANEOUS | Status: DC
  Administered 2018-02-04: 40 mg via SUBCUTANEOUS
  Filled 2018-02-03: qty 0.4

## 2018-02-03 MED ORDER — GABAPENTIN 300 MG PO CAPS
300.0000 mg | ORAL_CAPSULE | ORAL | Status: AC
  Administered 2018-02-03: 300 mg via ORAL
  Filled 2018-02-03: qty 1

## 2018-02-03 MED ORDER — SCOPOLAMINE 1 MG/3DAYS TD PT72
1.0000 | MEDICATED_PATCH | TRANSDERMAL | Status: DC
  Administered 2018-02-03: 1.5 mg via TRANSDERMAL
  Filled 2018-02-03: qty 1

## 2018-02-03 MED ORDER — SUGAMMADEX SODIUM 200 MG/2ML IV SOLN
INTRAVENOUS | Status: AC
  Filled 2018-02-03: qty 2

## 2018-02-03 MED ORDER — EPHEDRINE 5 MG/ML INJ
INTRAVENOUS | Status: AC
  Filled 2018-02-03: qty 10

## 2018-02-03 MED ORDER — DEXAMETHASONE SODIUM PHOSPHATE 4 MG/ML IJ SOLN: 4.0000 mg | INTRAMUSCULAR | Status: DC

## 2018-02-03 MED ORDER — HYDROMORPHONE HCL 1 MG/ML IJ SOLN: 0.2500 mg | INTRAMUSCULAR | Status: DC | PRN

## 2018-02-03 MED ORDER — ONDANSETRON 4 MG PO TBDP: 4.0000 mg | ORAL_TABLET | Freq: Four times a day (QID) | ORAL | Status: DC | PRN

## 2018-02-03 MED ORDER — ONDANSETRON HCL 4 MG/2ML IJ SOLN
INTRAMUSCULAR | Status: DC | PRN
  Administered 2018-02-03: 4 mg via INTRAVENOUS

## 2018-02-03 MED ORDER — DEXAMETHASONE SODIUM PHOSPHATE 10 MG/ML IJ SOLN
INTRAMUSCULAR | Status: DC | PRN
  Administered 2018-02-03: 6 mg via INTRAVENOUS

## 2018-02-03 MED ORDER — LIDOCAINE 2% (20 MG/ML) 5 ML SYRINGE
INTRAMUSCULAR | Status: DC | PRN
  Administered 2018-02-03: 100 mg via INTRAVENOUS

## 2018-02-03 MED ORDER — BUTALBITAL-APAP-CAFFEINE 50-325-40 MG PO TABS: 1.0000 | ORAL_TABLET | Freq: Two times a day (BID) | ORAL | Status: DC | PRN

## 2018-02-03 MED ORDER — BUPIVACAINE LIPOSOME 1.3 % IJ SUSP
20.0000 mL | Freq: Once | INTRAMUSCULAR | Status: AC
Start: 2018-02-03 — End: 2018-02-03
  Administered 2018-02-03: 20 mL
  Filled 2018-02-03: qty 20

## 2018-02-03 MED ORDER — MIDAZOLAM HCL 2 MG/2ML IJ SOLN
INTRAMUSCULAR | Status: AC
  Filled 2018-02-03: qty 2

## 2018-02-03 MED ORDER — SUGAMMADEX SODIUM 200 MG/2ML IV SOLN
INTRAVENOUS | Status: DC | PRN
  Administered 2018-02-03: 200 mg via INTRAVENOUS

## 2018-02-03 MED ORDER — BUPROPION HCL ER (XL) 300 MG PO TB24: 300.0000 mg | ORAL_TABLET | Freq: Every day | ORAL | Status: DC

## 2018-02-03 MED ORDER — CHLORHEXIDINE GLUCONATE 4 % EX LIQD: 60.0000 mL | Freq: Once | CUTANEOUS | Status: DC

## 2018-02-03 MED ORDER — ONDANSETRON HCL 4 MG/2ML IJ SOLN
INTRAMUSCULAR | Status: AC
  Filled 2018-02-03: qty 2

## 2018-02-03 MED ORDER — OXYCODONE HCL 5 MG/5ML PO SOLN
5.0000 mg | Freq: Once | ORAL | Status: DC | PRN
  Filled 2018-02-03: qty 5

## 2018-02-03 MED ORDER — PROPOFOL 10 MG/ML IV BOLUS
INTRAVENOUS | Status: AC
  Filled 2018-02-03: qty 20

## 2018-02-03 MED ORDER — HYDROMORPHONE HCL 1 MG/ML IJ SOLN
INTRAMUSCULAR | Status: DC
Start: 2018-02-03 — End: 2018-02-03
  Filled 2018-02-03: qty 1

## 2018-02-03 MED ORDER — PROMETHAZINE HCL 25 MG/ML IJ SOLN
6.2500 mg | INTRAMUSCULAR | Status: DC | PRN
  Administered 2018-02-03: 12.5 mg via INTRAVENOUS

## 2018-02-03 MED ORDER — SUCCINYLCHOLINE CHLORIDE 200 MG/10ML IV SOSY
PREFILLED_SYRINGE | INTRAVENOUS | Status: DC | PRN
  Administered 2018-02-03: 100 mg via INTRAVENOUS

## 2018-02-03 MED ORDER — PANTOPRAZOLE SODIUM 40 MG PO TBEC: 40.0000 mg | DELAYED_RELEASE_TABLET | Freq: Every day | ORAL | Status: DC

## 2018-02-03 MED ORDER — FENTANYL CITRATE (PF) 250 MCG/5ML IJ SOLN
INTRAMUSCULAR | Status: AC
Start: 2018-02-03 — End: ?
  Filled 2018-02-03: qty 5

## 2018-02-03 MED ORDER — DEXAMETHASONE SODIUM PHOSPHATE 10 MG/ML IJ SOLN
INTRAMUSCULAR | Status: AC
  Filled 2018-02-03: qty 1

## 2018-02-03 MED ORDER — HEPARIN SODIUM (PORCINE) 5000 UNIT/ML IJ SOLN
5000.0000 [IU] | INTRAMUSCULAR | Status: AC
  Administered 2018-02-03: 5000 [IU] via SUBCUTANEOUS
  Filled 2018-02-03: qty 1

## 2018-02-03 MED ORDER — PHENYLEPHRINE HCL 10 MG/ML IJ SOLN
INTRAMUSCULAR | Status: DC | PRN
  Administered 2018-02-03: 80 ug via INTRAVENOUS

## 2018-02-03 MED ORDER — ROCURONIUM BROMIDE 10 MG/ML (PF) SYRINGE
PREFILLED_SYRINGE | INTRAVENOUS | Status: AC
  Filled 2018-02-03: qty 5

## 2018-02-03 MED ORDER — FLUTICASONE PROPIONATE 50 MCG/ACT NA SUSP: 1.0000 | Freq: Every day | NASAL | Status: DC | PRN

## 2018-02-03 MED ORDER — ONDANSETRON HCL 4 MG/2ML IJ SOLN: 4.0000 mg | Freq: Four times a day (QID) | INTRAMUSCULAR | Status: DC | PRN

## 2018-02-03 MED ORDER — DIPHENHYDRAMINE HCL 12.5 MG/5ML PO ELIX: 12.5000 mg | ORAL_SOLUTION | Freq: Four times a day (QID) | ORAL | Status: DC | PRN

## 2018-02-03 MED ORDER — SODIUM CHLORIDE 0.9 % IJ SOLN
INTRAMUSCULAR | Status: DC | PRN
  Administered 2018-02-03: 50 mL

## 2018-02-03 MED ORDER — APREPITANT 40 MG PO CAPS
40.0000 mg | ORAL_CAPSULE | ORAL | Status: AC
  Administered 2018-02-03: 40 mg via ORAL
  Filled 2018-02-03: qty 1

## 2018-02-03 MED ORDER — DIPHENHYDRAMINE HCL 50 MG/ML IJ SOLN: 12.5000 mg | Freq: Four times a day (QID) | INTRAMUSCULAR | Status: DC | PRN

## 2018-02-03 MED ORDER — ALBUTEROL SULFATE (2.5 MG/3ML) 0.083% IN NEBU: 3.0000 mL | INHALATION_SOLUTION | Freq: Four times a day (QID) | RESPIRATORY_TRACT | Status: DC | PRN

## 2018-02-03 MED ORDER — ACETAMINOPHEN 500 MG PO TABS
1000.0000 mg | ORAL_TABLET | ORAL | Status: AC
  Administered 2018-02-03: 1000 mg via ORAL
  Filled 2018-02-03: qty 2

## 2018-02-03 MED ORDER — PROMETHAZINE HCL 25 MG/ML IJ SOLN: 12.5000 mg | Freq: Four times a day (QID) | INTRAMUSCULAR | Status: DC | PRN

## 2018-02-03 MED ORDER — 0.9 % SODIUM CHLORIDE (POUR BTL) OPTIME
TOPICAL | Status: DC | PRN
  Administered 2018-02-03: 1000 mL

## 2018-02-03 MED ORDER — OXYCODONE HCL 5 MG PO TABS
5.0000 mg | ORAL_TABLET | ORAL | Status: DC | PRN
  Administered 2018-02-03: 10 mg via ORAL
  Filled 2018-02-03: qty 2

## 2018-02-03 MED ORDER — EPHEDRINE SULFATE 50 MG/ML IJ SOLN
INTRAMUSCULAR | Status: DC | PRN
  Administered 2018-02-03: 10 mg via INTRAVENOUS

## 2018-02-03 MED ORDER — KCL IN DEXTROSE-NACL 20-5-0.45 MEQ/L-%-% IV SOLN
INTRAVENOUS | Status: DC
  Administered 2018-02-03: 19:00:00 via INTRAVENOUS
  Filled 2018-02-03: qty 1000

## 2018-02-03 MED ORDER — OXYCODONE HCL 5 MG PO TABS: 5.0000 mg | ORAL_TABLET | Freq: Once | ORAL | Status: DC | PRN

## 2018-02-03 MED ORDER — KETAMINE HCL 10 MG/ML IJ SOLN
INTRAMUSCULAR | Status: DC | PRN
  Administered 2018-02-03: 25 mg via INTRAVENOUS

## 2018-02-03 MED ORDER — PROPOFOL 10 MG/ML IV BOLUS
INTRAVENOUS | Status: DC | PRN
  Administered 2018-02-03: 200 mg via INTRAVENOUS

## 2018-02-03 MED ORDER — MIDAZOLAM HCL 5 MG/5ML IJ SOLN
INTRAMUSCULAR | Status: DC | PRN
  Administered 2018-02-03: 2 mg via INTRAVENOUS

## 2018-02-03 MED ORDER — LEVOFLOXACIN IN D5W 750 MG/150ML IV SOLN
750.0000 mg | INTRAVENOUS | Status: AC
  Administered 2018-02-03: 750 mg via INTRAVENOUS
  Filled 2018-02-03: qty 150

## 2018-02-03 MED ORDER — LACTATED RINGERS IR SOLN
Status: DC | PRN
  Administered 2018-02-03: 1000 mL

## 2018-02-03 MED ORDER — ROCURONIUM BROMIDE 50 MG/5ML IV SOSY
PREFILLED_SYRINGE | INTRAVENOUS | Status: DC | PRN
  Administered 2018-02-03: 40 mg via INTRAVENOUS
  Administered 2018-02-03: 10 mg via INTRAVENOUS

## 2018-02-03 SURGICAL SUPPLY — 43 items
APL SKNCLS STERI-STRIP NONHPOA (GAUZE/BANDAGES/DRESSINGS) ×1
BANDAGE ADH SHEER 1  50/CT (GAUZE/BANDAGES/DRESSINGS) ×12 IMPLANT
BENZOIN TINCTURE PRP APPL 2/3 (GAUZE/BANDAGES/DRESSINGS) ×2 IMPLANT
BLADE SURG SZ11 CARB STEEL (BLADE) ×2 IMPLANT
CHLORAPREP W/TINT 26ML (MISCELLANEOUS) ×2 IMPLANT
DECANTER SPIKE VIAL GLASS SM (MISCELLANEOUS) IMPLANT
DERMABOND ADVANCED (GAUZE/BANDAGES/DRESSINGS)
DERMABOND ADVANCED .7 DNX12 (GAUZE/BANDAGES/DRESSINGS) IMPLANT
DEVICE SUTURE ENDOST 10MM (ENDOMECHANICALS) IMPLANT
DISSECTOR BLUNT TIP ENDO 5MM (MISCELLANEOUS) IMPLANT
DRAPE UTILITY XL STRL (DRAPES) ×2 IMPLANT
ELECT PENCIL ROCKER SW 15FT (MISCELLANEOUS) ×2 IMPLANT
ELECT REM PT RETURN 15FT ADLT (MISCELLANEOUS) ×2 IMPLANT
GLOVE BIO SURGEON STRL SZ7.5 (GLOVE) ×2 IMPLANT
GLOVE INDICATOR 8.0 STRL GRN (GLOVE) ×2 IMPLANT
GOWN STRL REUS W/TWL LRG LVL3 (GOWN DISPOSABLE) ×2 IMPLANT
GOWN STRL REUS W/TWL XL LVL3 (GOWN DISPOSABLE) ×8 IMPLANT
GRASPER SUT TROCAR 14GX15 (MISCELLANEOUS) ×2 IMPLANT
HOVERMATT SINGLE USE (MISCELLANEOUS) ×2 IMPLANT
KIT BASIN OR (CUSTOM PROCEDURE TRAY) ×2 IMPLANT
NEEDLE SPNL 22GX3.5 QUINCKE BK (NEEDLE) ×2 IMPLANT
NS IRRIG 1000ML POUR BTL (IV SOLUTION) ×2 IMPLANT
PACK UNIVERSAL I (CUSTOM PROCEDURE TRAY) ×2 IMPLANT
SET IRRIG TUBING LAPAROSCOPIC (IRRIGATION / IRRIGATOR) ×2 IMPLANT
SHEARS HARMONIC ACE PLUS 36CM (ENDOMECHANICALS) IMPLANT
SLEEVE XCEL OPT CAN 5 100 (ENDOMECHANICALS) ×6 IMPLANT
SOLUTION ANTI FOG 6CC (MISCELLANEOUS) ×2 IMPLANT
SPONGE LAP 18X18 X RAY DECT (DISPOSABLE) ×2 IMPLANT
STRIP CLOSURE SKIN 1/2X4 (GAUZE/BANDAGES/DRESSINGS) ×2 IMPLANT
SUT MNCRL AB 4-0 PS2 18 (SUTURE) ×2 IMPLANT
SUT SILK 0 (SUTURE) ×2
SUT SILK 0 30XBRD TIE 6 (SUTURE) ×1 IMPLANT
SUT VIC AB 2-0 SH 27 (SUTURE) ×1
SUT VIC AB 2-0 SH 27X BRD (SUTURE) ×1 IMPLANT
SUT VICRYL 0 TIES 12 18 (SUTURE) ×2 IMPLANT
SUT VICRYL 0 UR6 27IN ABS (SUTURE) ×2 IMPLANT
SYR 20CC LL (SYRINGE) ×2 IMPLANT
TOWEL OR 17X26 10 PK STRL BLUE (TOWEL DISPOSABLE) ×2 IMPLANT
TOWEL OR NON WOVEN STRL DISP B (DISPOSABLE) ×2 IMPLANT
TROCAR BLADELESS 15MM (ENDOMECHANICALS) ×2 IMPLANT
TROCAR BLADELESS OPT 5 100 (ENDOMECHANICALS) ×2 IMPLANT
TROCAR XCEL BLUNT TIP 100MML (ENDOMECHANICALS) ×2 IMPLANT
TUBING INSUF HEATED (TUBING) ×2 IMPLANT

## 2018-02-03 NOTE — Anesthesia Procedure Notes (Signed)
Procedure Name: Intubation Date/Time: 02/03/2018 1:42 PM Performed by: Maxwell Caul, CRNA Pre-anesthesia Checklist: Patient identified, Emergency Drugs available, Suction available and Patient being monitored Patient Re-evaluated:Patient Re-evaluated prior to induction Oxygen Delivery Method: Circle system utilized Preoxygenation: Pre-oxygenation with 100% oxygen Induction Type: IV induction Ventilation: Mask ventilation without difficulty Laryngoscope Size: Mac and 4 Grade View: Grade I Tube type: Oral Tube size: 7.5 mm Number of attempts: 1 Airway Equipment and Method: Stylet Placement Confirmation: ETT inserted through vocal cords under direct vision,  positive ETCO2 and breath sounds checked- equal and bilateral Secured at: 21 cm Tube secured with: Tape Dental Injury: Teeth and Oropharynx as per pre-operative assessment

## 2018-02-03 NOTE — Interval H&P Note (Signed)
History and Physical Interval Note:  02/03/2018 12:36 PM  [name]  has presented today for surgery, with the diagnosis of Lap Band Surgery Status. Vomiting, Iron Deficiency,  The various methods of treatment have been discussed with the patient and family. After consideration of risks, benefits and other options for treatment, the patient has consented to  Procedure(s): LAPAROSCOPIC REMOVAL OF GASTRIC BAND and COMPONENTS, UPPER ENDO (N/A) as a surgical intervention .  The patient's history has been reviewed, patient examined, no change in status, stable for surgery.  I have reviewed the patient's chart and labs.  Questions were answered to the patient's satisfaction.    Mary SellaEric M. Andrey CampanileWilson, MD, FACS General, Bariatric, & Minimally Invasive Surgery [facility]  Gaynelle AduEric Ura Yingling

## 2018-02-03 NOTE — Anesthesia Preprocedure Evaluation (Signed)
Anesthesia Evaluation  Patient identified by MRN, date of birth, ID band Patient awake    Reviewed: Allergy & Precautions, NPO status , Patient's Chart, lab work & pertinent test results  Airway Mallampati: II  TM Distance: >3 FB Neck ROM: Full    Dental no notable dental hx.    Pulmonary neg pulmonary ROS, asthma ,    Pulmonary exam normal breath sounds clear to auscultation       Cardiovascular negative cardio ROS Normal cardiovascular exam Rhythm:Regular Rate:Normal     Neuro/Psych  Headaches, Depression negative neurological ROS  negative psych ROS   GI/Hepatic negative GI ROS, Neg liver ROS,   Endo/Other  negative endocrine ROS  Renal/GU negative Renal ROS  negative genitourinary   Musculoskeletal negative musculoskeletal ROS (+)   Abdominal (+) + obese,   Peds negative pediatric ROS (+)  Hematology negative hematology ROS (+) anemia ,   Anesthesia Other Findings   Reproductive/Obstetrics negative OB ROS                             Anesthesia Physical Anesthesia Plan  ASA: II  Anesthesia Plan: General   Post-op Pain Management:    Induction: Intravenous  PONV Risk Score and Plan: 3 and Ondansetron, Dexamethasone and Midazolam  Airway Management Planned: Oral ETT  Additional Equipment:   Intra-op Plan:   Post-operative Plan: Extubation in OR  Informed Consent: I have reviewed the patients History and Physical, chart, labs and discussed the procedure including the risks, benefits and alternatives for the proposed anesthesia with the patient or authorized representative who has indicated his/her understanding and acceptance.   Dental advisory given  Plan Discussed with: CRNA  Anesthesia Plan Comments:         Anesthesia Quick Evaluation

## 2018-02-03 NOTE — Brief Op Note (Signed)
02/03/2018  3:16 PM  PATIENT:  Pam AmisBeverly T Hauth  42 y.o. 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, dysphagia/vomiting  POST-OPERATIVE DIAGNOSIS:  Same + lap band slippage  PROCEDURE:  Procedure(s): LAPAROSCOPIC REMOVAL OF GASTRIC BAND and COMPONENTS, UPPER ENDOSCOPY (N/A)  SURGEON:  Surgeon(s) and Role:    Gaynelle Adu* Harriet Bollen, MD - Primary    * Luretha MurphyMartin, Matthew, MD - Assisting   PHYSICIAN ASSISTANT:   ASSISTANTS: Luretha MurphyMatthew Martin MD FACS   ANESTHESIA:   general  EBL:  15 cc  BLOOD ADMINISTERED:none  DRAINS: none   LOCAL MEDICATIONS USED:  OTHER exparel  SPECIMEN:  Source of Specimen:  gastric band  DISPOSITION OF SPECIMEN:  discarded  COUNTS:  YES  TOURNIQUET:  * No tourniquets in log *  DICTATION: .Other Dictation: Dictation Number [id number]  PLAN OF CARE: Admit for overnight observation  PATIENT DISPOSITION:  PACU - hemodynamically stable.   Delay start of Pharmacological VTE agent (>24hrs) due to surgical blood loss or risk of bleeding: no

## 2018-02-03 NOTE — Anesthesia Postprocedure Evaluation (Signed)
Anesthesia Post Note  Patient: [name]  Procedure(s) Performed: LAPAROSCOPIC REMOVAL OF GASTRIC BAND and COMPONENTS, UPPER ENDOSCOPY (N/A Abdomen)     Patient location during evaluation: PACU Anesthesia Type: General Level of consciousness: awake and alert Pain management: pain level controlled Vital Signs Assessment: post-procedure vital signs reviewed and stable Respiratory status: spontaneous breathing, nonlabored ventilation and respiratory function stable Cardiovascular status: blood pressure returned to baseline and stable Postop Assessment: no apparent nausea or vomiting Anesthetic complications: no    Last Vitals:  Vitals:   02/03/18 1744 02/03/18 1844  BP: (!) 142/69 135/74  Pulse: 76 79  Resp: 15 16  Temp: (!) 36.3 C 36.6 C  SpO2: 98% 98%    Last Pain:  Vitals:   02/03/18 1844  TempSrc: Axillary  PainSc:                  Lowella CurbWarren Ray Chamari Cutbirth

## 2018-02-03 NOTE — H&P (Signed)
[name] Documented: 01/16/2018 2:10 PM Location: [facility] Patient #: [id number] DOB: [birth year] Married / Language: English / Race: White Female   History of Present Illness [name]([name]; 01/16/2018 2:42 PM) The patient is a 42 year old female is here for LapBand followup. [name] is a 42 year old patient of mine who received an AP Standard band in December 2011. Her preoperative weight was 280 pounds. She was last seen Jan 2019 & her weight was 210pounds. She came in with significant heartburn and reflux issues as well as almost daily regurgitation. We removed all her fluid at that visit which was around 4.8 cc. She was instructed to call if she had ongoing issues after her band vacation. She alerted us that she was still having issues with nighttime regurgitation if she ate after 6:30 PM. Therefore we ordered an upper GI which was grossly abnormal. She had what appeared to be herniated stomach above her band as well as a dilated distal esophagus and slow passage of contrast through the band. She comes back in stating that her reflux is controlled while she is taking in the reflux medication. She will still have regurgitation if she eats something solid after 6:30 PM. She is not regurgitating liquids. She did have an episode of syncope and went to the emergency room for evaluation. She is on have iron deficiency anemia. Hemoglobin was around 10-1/2. She has been referred to hematology and is appointment on 1 April. Otherwise she denies any medical changes. She just has had some fatigue. She is taking oral iron. She is not taking any supplemental vitamin C with it. She is going to physical therapy twice a week for her back issue. Otherwise she denies any chest pain, chest pressure, chest tightness, shortness of breath, orthopnea.   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 Hysterectomy (not due to cancer) - Partial  Lap Band  Oral Surgery  Resection of Stomach   Diagnostic Studies History [name]([name]; 01/16/2018 2:43 PM) Colonoscopy  >10 years ago Mammogram  1-3 years ago Pap Smear  1-5 years ago  Allergies Sander Nephew([name], Nguyen; 01/16/2018 2:11 PM) Codeine/Codeine Derivatives  Septra *ANTI-INFECTIVE AGENTS - MISC.*  Augmentin *PENICILLINS*  Codeine Phosphate *ANALGESICS - OPIOID*  Allergies Reconciled   Medication History ([name], Nguyen; 01/16/2018 2:11 PM) Butalbital-APAP-Caffeine (50-325-40MG  Tablet, Oral) Active. EPINEPHrine (0.3MG /0.3ML Soln Auto-inj, Injection) Active. Pantoprazole Sodium (40MG  Tablet DR, Oral) Active. Allegra-D Allergy & Congestion (180-240MG  Tablet ER 24HR, Oral) Active. Wellbutrin XL (300MG  Tablet ER 24HR, Oral) Active. Flonase Allergy Relief (50MCG/ACT Suspension, Nasal) Active. Allegra Allergy (180MG  Tablet, Oral) Active. Medications Reconciled  Social History [name]([name]; 01/16/2018 2:43 PM) Alcohol use  Remotely quit alcohol use. Caffeine use  Carbonated beverages, Coffee, Tea. No drug use  Tobacco use  Never smoker.  Family History [name]([name], [name]; 01/16/2018 2:43 PM) Alcohol Abuse  Family Members In General. Anesthetic complications  Father. Arthritis  Family Members In General. Bleeding disorder  Family Members In General. Breast Cancer  Family Members In General. Cancer  Family Members In General.  Colon Cancer  Family Members In General. Colon Polyps  Family Members In  General. Depression  Sister. Diabetes Mellitus  Father, Sister. Heart Disease  Family Members In General, Father. Hypertension  Family Members In General, Father, Mother. Melanoma  Family Members In General, Father. Migraine Headache  Mother, Son. Respiratory Condition  Family Members In General. Seizure disorder  Family Members In General, Sister. Thyroid problems  Family Members In General.  Pregnancy / Birth History [name]. Andrey Campanile, [name]; 01/16/2018 2:43 PM) Age at menarche  10 years. Contraceptive History  Intrauterine device, Oral contraceptives. Gravida  3 Maternal age  48-25 Para  3  Other Problems [name]. Andrey Campanile, [name]; 01/16/2018 2:43 PM) Anxiety Disorder  Asthma  Back Pain  Depression  Diverticulosis  Lump In Breast  Migraine Headache  Other disease, cancer, significant illness     Review of Systems [name]; 01/16/2018 2:43 PM) All other systems negative  Vitals ([name]; 01/16/2018 2:11 PM) 01/16/2018 2:11 PM Weight: 220.5 lb Height: 65in Body Surface Area: 2.06 m Body Mass Index: 36.69 kg/m  Temp.: 98.59F(Oral)  Pulse: 85 (Regular)  BP: 132/98 (Sitting, Left Arm, Standard)       Physical Exam [name]; 01/16/2018 2:42 PM) General Mental Status-Alert. General Appearance-Consistent with stated age. Hydration-Well hydrated. Voice-Normal.  Head and Neck Head-normocephalic, atraumatic with no lesions or palpable masses. Trachea-midline. Thyroid Gland Characteristics - normal size and consistency.  Eye Eyeball - Bilateral-Normal. Sclera/Conjunctiva - Bilateral-No scleral icterus.  ENMT Ears -Note: normal ext ears lips intact.   Chest and Lung Exam Chest and lung exam reveals -quiet, even and easy respiratory effort with no use of accessory muscles and on auscultation, normal breath sounds, no adventitious sounds and normal vocal resonance. Inspection Chest  Wall - Normal. Back - normal.  Breast - Did not examine.  Cardiovascular Cardiovascular examination reveals -normal heart sounds, regular rate and rhythm with no murmurs and normal pedal pulses bilaterally.  Abdomen Inspection Inspection of the abdomen reveals - No Hernias. Skin - Scar - Note: well healed trocar scars; palpable port. Palpation/Percussion Palpation and Percussion of the abdomen reveal - Soft, Non Tender, No Rebound tenderness, No Rigidity (guarding) and No hepatosplenomegaly. Auscultation Auscultation of the abdomen reveals - Bowel sounds normal.  Peripheral Vascular Upper Extremity Palpation - Pulses bilaterally normal.  Neurologic Neurologic evaluation reveals -alert and oriented x 3 with no impairment of recent or remote memory. Mental Status-Normal.  Neuropsychiatric The patient's mood and affect are described as -normal. Judgment and Insight-insight is appropriate concerning matters relevant to self.  Musculoskeletal Normal Exam - Left-Upper Extremity Strength Normal and Lower Extremity Strength Normal. Normal Exam - Right-Upper Extremity Strength Normal and Lower Extremity Strength Normal.  Lymphatic Head & Neck  General Head & Neck Lymphatics: Bilateral - Description - Normal. Axillary - Did not examine. Femoral & Inguinal - Did not examine.    Assessment & Plan [name] M. Rubee Vega [name]; 01/16/2018 2:43 PM) VOMITING IN ADULT (R11.10) Current Plans You are being scheduled for surgery- Our schedulers will call you.  You should hear from our office's scheduling department within 5 working days about the location, date, and time of surgery. We try to make accommodations for patient's preferences in scheduling surgery, but sometimes the OR schedule or the surgeon's schedule prevents [place] from making those accommodations.  If you have not heard from our office [phone]) in 5 working days, call the office and ask for your surgeon's  nurse.  If you have  other questions about your diagnosis, plan, or surgery, call the office and ask for your surgeon's nurse.  OBESITY (BMI 30-39.9) (E66.9) LAP-BAND SURGERY STATUS (Z98.84) Story: History of laparoscopic adjustable gastric band placement December 2011, AP standard; preoperative BMI 47.2 Impression: Unfortunately despite completely emptying her band she has evidence of possible lap band slippage and significantly delayed passage of contrast on upper GI. She has distal esophageal dysmotility. Her distal esophagus is dilated. I reaccessed her band today and there was only 0.2 cc remaining in her band so I don't think this removal of this tiny amount of fluid will result in improvement of her disordered esophageal motility and dilated gastric pouch. C therefore the best and only recommendation I have for the patient is laparoscopic adjustable gastric band removal. We discussed the risk and benefits of the procedure including but not limited to bleeding, infection, injury to surrounding structures such as the stomach esophagus spleen or liver. We discussed risk of blood clots, perioperative cardiac and pulmonary events. I discussed the typical recovery. I explained that it may take weeks to months before her esophagus returns to normal. We discussed that there is a tiny possibility her esophageal dysmotility could be permanent. I believe she should continue seeing the hematologist as scheduled for her iron deficiency anemia. We will get her scheduled for removal of her lap band in the near future. 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 Andrey Campanile, [name], FACS General, Bariatric, & Minimally Invasive Surgery [facility], [place]

## 2018-02-03 NOTE — Transfer of Care (Signed)
Immediate Anesthesia Transfer of Care Note  Patient: [name]  Procedure(s) Performed: LAPAROSCOPIC REMOVAL OF GASTRIC BAND and COMPONENTS, UPPER ENDOSCOPY (N/A Abdomen)  Patient Location: PACU  Anesthesia Type:General  Level of Consciousness: awake, alert  and oriented  Airway & Oxygen Therapy: Patient Spontanous Breathing and Patient connected to face mask oxygen  Post-op Assessment: Report given to RN and Post -op Vital signs reviewed and stable  Post vital signs: Reviewed and stable  Last Vitals:  Vitals Value Taken Time  BP    Temp    Pulse 82 02/03/2018  3:32 PM  Resp 13 02/03/2018  3:32 PM  SpO2 100 % 02/03/2018  3:32 PM    Last Pain:  Vitals:   02/03/18 1231  TempSrc:   PainSc: 0-No pain      Patients Stated Pain Goal: 4 (02/03/18 1231)  Complications: No apparent anesthesia complications

## 2018-02-04 DIAGNOSIS — K9509 Other complications of gastric band procedure: Secondary | ICD-10-CM | POA: Diagnosis not present

## 2018-02-04 MED ORDER — OXYCODONE HCL 5 MG PO TABS: 5.0000 mg | ORAL_TABLET | Freq: Four times a day (QID) | ORAL | 0 refills | Status: DC | PRN

## 2018-02-04 NOTE — Discharge Summary (Signed)
Physician Discharge Summary  [name] ZOX:[id number] DOB: 20-Dec-1975 DOA: 02/03/2018  PCP: Dianne Dun, MD  Admit date: 02/03/2018 Discharge date: 02/04/2018  Recommendations for Outpatient Follow-up:    Follow-up Information    Gaynelle Adu, MD. Schedule an appointment as soon as possible for a visit in 3 week(s).   Specialty:  General Surgery Contact information: [address] [place] [address] [phone]          Discharge Diagnoses:  1. H/o adjustable gastric band placement 09/2010 2. Dysphagia/vomiting 3. Laparoscopic removal of adjustable gastric band 4. Anemia - chronic  Surgical Procedure: Laparoscopic removal of adjustable gastric band  Discharge Condition: good Disposition: home  Diet recommendation: fulls and advance to regular  Filed Weights   02/03/18 1201 02/04/18 0454  Weight: 99.2 kg (218 lb 12.8 oz) 101.6 kg (223 lb 15.8 oz)    History of present illness: The patient is a 42 year old female is here for LapBand followup. [name] is a 42 year old patient of mine who received an AP Standard band in December 2011. Her preoperative weight was 280 pounds. She was last seen Jan 2019 & her weight was 210pounds. She came in with significant heartburn and reflux issues as well as almost daily regurgitation. We removed all her fluid at that visit which was around 4.8 cc. She was instructed to call if she had ongoing issues after her band vacation. She alerted [place] that she was still having issues with nighttime regurgitation if she ate after 6:30 PM. Therefore we ordered an upper GI which was grossly abnormal. She had what appeared to be herniated stomach above her band as well as a dilated distal esophagus and slow passage of contrast through the band. She comes back in stating that her reflux is controlled while she is taking in the reflux medication. She will still have regurgitation if she eats something solid after 6:30 PM.  She is not regurgitating liquids. She did have an episode of syncope and went to the emergency room for evaluation. She is on have iron deficiency anemia. Hemoglobin was around 10-1/2. She has been referred to hematology and is appointment on 1 April. Otherwise she denies any medical changes. She just has had some fatigue. She is taking oral iron. She is not taking any supplemental vitamin C with it. She is going to physical therapy twice a week for her back issue. Otherwise she denies any chest pain, chest pressure, chest tightness, shortness of breath, orthopnea.     Hospital Course:  She underwent removal of adjustable gastric band and its components and she was kept overnight. On POD 1 she was doing well. No n/v. Tolerating liquids without vomiting. Vitals stable. No fever. No tachycardia. We discussed dc instructions.   BP (!) 101/52 (BP Location: Left Arm)   Pulse (!) 57   Temp 97.7 F (36.5 C) (Oral)   Resp 18   Ht 5\' 5"  (1.651 m)   Wt 101.6 kg (223 lb 15.8 oz)   LMP 11/07/2012   SpO2 98%   BMI 37.27 kg/m   Gen: alert, NAD, non-toxic appearing Pupils: equal, no scleral icterus Pulm: Lungs clear to auscultation, symmetric chest rise CV: regular rate and rhythm Abd: soft, min tender, nondistended.  No cellulitis. No incisional hernia Ext: no edema, no calf tenderness Skin: no rash, no jaundice    Discharge Instructions  Discharge Instructions    Ambulate hourly while awake   Complete by:  As  directed    Call MD for:  difficulty breathing, headache or visual disturbances   Complete by:  As directed    Call MD for:  persistant dizziness or light-headedness   Complete by:  As directed    Call MD for:  persistant nausea and vomiting   Complete by:  As directed    Call MD for:  redness, tenderness, or signs of infection (pain, swelling, redness, odor or green/yellow discharge around incision site)   Complete by:  As directed    Call MD for:  severe uncontrolled  pain   Complete by:  As directed    Call MD for:  temperature >101 F   Complete by:  As directed    Diet bariatric full liquid   Complete by:  As directed    Discharge instructions   Complete by:  As directed    See bariatric discharge instructions   Incentive spirometry   Complete by:  As directed    Perform hourly while awake     Allergies as of 02/04/2018      Reactions   Cashew Nut Oil Anaphylaxis   Cauliflower [brassica Oleracea Italica] Anaphylaxis   Septra Ds [sulfamethoxazole-trimethoprim]    Hives, SOB, itching   Whey Anaphylaxis   Orange Fruit [citrus] Other (See Comments)   Migraines    Augmentin [amoxicillin-pot Clavulanate] Diarrhea, Nausea And Vomiting   Has patient had a PCN reaction causing immediate rash, facial/tongue/throat swelling, SOB or lightheadedness with hypotension: No Has patient had a PCN reaction causing severe rash involving mucus membranes or skin necrosis: No Has patient had a PCN reaction that required hospitalization: No Has patient had a PCN reaction occurring within the last 10 years: Unknown If all of the above answers are "NO", then may proceed with Cephalosporin use.   Codeine    rash   Milk-related Compounds    Other    epidural steroid injection - severe headaches, high fever   Tree Extract Itching   Tee tree oil      Medication List    TAKE these medications   buPROPion 150 MG 24 hr tablet Commonly known as:  WELLBUTRIN XL Take 300 mg by mouth daily.   butalbital-acetaminophen-caffeine 50-325-40 MG tablet Commonly known as:  FIORICET, ESGIC Take 1 tablet by mouth 2 (two) times daily as needed for headache.   EPINEPHrine 0.3 mg/0.3 mL Devi Commonly known as:  EPIPEN Inject 0.3 mLs (0.3 mg total) into the muscle once.   fexofenadine-pseudoephedrine 180-240 MG 24 hr tablet Commonly known as:  ALLEGRA-D 24 Take 1 tablet by mouth at bedtime as needed (allergies).   fluticasone 50 MCG/ACT nasal spray Commonly known as:   FLONASE Place 1 spray into both nostrils daily as needed for allergies.   gabapentin 300 MG capsule Commonly known as:  NEURONTIN Take 300 mg by mouth at bedtime.   Iron 142 (45 Fe) MG Tbcr Take 2 tablets by mouth every other day.   naproxen sodium 220 MG tablet Commonly known as:  ALEVE Take 220-440 mg by mouth daily as needed (pain).   oxyCODONE 5 MG immediate release tablet Commonly known as:  Oxy IR/ROXICODONE Take 1 tablet (5 mg total) by mouth every 6 (six) hours as needed for moderate pain.   pantoprazole 40 MG tablet Commonly known as:  PROTONIX Take 40 mg by mouth daily.   PROAIR HFA 108 (90 Base) MCG/ACT inhaler Generic drug:  albuterol Inhale 2 puffs into the lungs every 6 (six) hours as needed  for wheezing.   trolamine salicylate 10 % cream Commonly known as:  ASPERCREME Apply 1 application topically as needed for muscle pain.      Follow-up Information    Gaynelle AduWilson, Abiha Lukehart, MD. Schedule an appointment as soon as possible for a visit in 3 week(s).   Specialty:  General Surgery Contact information: [address] [place] [address] [phone]            The results of significant diagnostics from this hospitalization (including imaging, microbiology, ancillary and laboratory) are listed below for reference.    Significant Diagnostic Studies: No results found.  Microbiology: No results found for this or any previous visit (from the past 240 hour(s)).   Labs: Basic Metabolic Panel: Recent Labs  Lab 01/30/18 0903  NA 139  K 4.3  CL 105  CO2 28  GLUCOSE 88  BUN 7  CREATININE 0.90  CALCIUM 8.8*   Liver Function Tests: Recent Labs  Lab 01/30/18 0903  AST 17  ALT 15  ALKPHOS 89  BILITOT 0.6  PROT 7.0  ALBUMIN 3.8   No results for input(s): LIPASE, AMYLASE in the last 168 hours. No results for input(s): AMMONIA in the last 168 hours. CBC: Recent Labs  Lab 01/30/18 0903  WBC 3.8*  HGB 11.3*  HCT 37.7  MCV 80.4  PLT  294   Cardiac Enzymes: No results for input(s): CKTOTAL, CKMB, CKMBINDEX, TROPONINI in the last 168 hours. BNP: BNP (last 3 results) No results for input(s): BNP in the last 8760 hours.  ProBNP (last 3 results) No results for input(s): PROBNP in the last 8760 hours.  CBG: No results for input(s): GLUCAP in the last 168 hours.  Active Problems:   History of removal of laparoscopic gastric banding device   Time coordinating discharge: 15 min  Signed:  Atilano InaEric M Jamayah Myszka, MD [facility], [place] [phone] 02/04/2018, 8:45 AM

## 2018-02-04 NOTE — Op Note (Signed)
[name], [name]              ACCOUNT NO.:  [id number]  MEDICAL RECORD NO.:  [id number]  LOCATION:                                 FACILITY:  PHYSICIAN:  Pam Sella. Andrey Campanile, MD, FACSDATE OF BIRTH:  18-Mar-1976  DATE OF PROCEDURE:  02/03/2018 DATE OF DISCHARGE:                              OPERATIVE REPORT   PREOPERATIVE DIAGNOSES: 1. History of adjustable gastric band placement in December 2011. 2. Dysphagia/vomiting.  POSTOPERATIVE DIAGNOSES: 1. History of adjustable gastric band placement in December 2011. 2. Dysphagia/vomiting. 3. Lap-Band slippage.  PROCEDURES: 1. Laparoscopic removal of adjustable gastric band. 2. Upper endoscopy.  SURGEON:  Pam Sella. Andrey Campanile, MD, FACS.  ASSISTANT SURGEON:  Pam Park. Daphine Deutscher, MD FACS  ANESTHESIA:  General.  ESTIMATED BLOOD LOSS:  15 mL.  SPECIMEN:  Adjustable gastric band and components, which were discarded.  INDICATIONS FOR PROCEDURE:  The patient is a pleasant 42 year old female, who underwent placement of an AP standard adjustable gastric band in December 2011.  Recently, she has been having progressive and worsening issues with dysphagia and regurgitation.  All the fluid was removed from her band, but the patient continued to have ongoing symptoms of regurgitation and vomiting.  She could not eat solid food after 6 p.m. without waking up in the middle of the night with regurgitation.  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 Since there was no fluid left in the band, the only option I had for the patient was to remove the adjustable gastric band.  She did not desire conversion to a different procedure at this time.  We discussed at length the risks and benefits of the procedure.  Please see my notes for that discussion.  DESCRIPTION OF PROCEDURE:  The patient was taken to the  operating room #2 at [facility] and placed supine on the operating room table.  General endotracheal anesthesia was established.  Sequential compression devices were placed.  Her abdomen was prepped and draped in the usual standard surgical fashion.  She received IV antibiotic prior to skin incision.  A surgical time-out was performed.  I elected to gain access to her abdomen using the Optiview technique.  A small incision was made in the left upper quadrant about 2 cm below the left subcostal margin in an old trocar site.  Using a 0-degree 5-mm laparoscope through a 5-mm trocar, I advanced it through all layers of the abdominal wall and entered the abdominal cavity.  Pneumoperitoneum was smoothly established up to a patient pressure of 15 mmHg without any change in vital signs.  The abdominal cavity was surveilled.  There was no evidence of injury to surrounding structures.  The patient was placed in reverse Trendelenburg.  A 5-mm trocar was placed in the left midabdomen slightly to the left to the umbilicus, a 5-mm trocar in the right lateral upper abdominal wall and a 15 trocar in the right mid abdomen all under direct visualization after local had been infiltrated. We also placed Exparel in bilateral abdominal walls as a TAP block.  A 5- mm trocar  was placed in the subxiphoid position and the left lobe of the liver was lifted up with a [facility] liver retractor.  The band was visualized.  There appeared to be stomach that had herniated through the band consistent with Lap-Band slippage.  She did have evidence of an enlarged diaphragmatic defect.  I ended up using hook electrocautery and incising the fibrous capsule on top of the band exposing the buckle of the band.  I then carried this slightly to the patient's right toward the right crus of the diaphragm, freeing up the capsule directly on top of the band.  At this point, the band was unbuckled.  We then turned  our attention toward the fundus of the stomach where the gastrogastric imbrication sutures had been placed to imbricate the band.  It appeared that some of the stomach was herniated above the level of the band and the band had had a posterior slip.  I identified the imbrication sutures and using Endo Shears without electrocautery; I took that down.  Just releasing the imbrication that had originally been placed to keep the band from slipping.  At this point, I removed the band from around the stomach and took down some more of the capsule with Endo Shears without electrocautery.  It appeared that I had completely released the portion of the stomach that had been imbricated to the small gastric pouch that had been placed at the time of band insertion.  There still appeared to be diaphragmatic defect consistent with hiatal hernia.  At this point Dr. Daphine DeutscherMartin scrubbed out and performed an upper endoscopy.  The endoscope was able to be passed down the esophagus through the small gastric pouch into the distal stomach.  The stomach was suctioned out.  There did not appear to be any mucosal defect from us taking down the band.  There appeared to be a little bit of stomach still left above the hiatus. However, I decided to not continue with trying to reduce any more of the stomach.  The point of obstruction namely the band had been removed.  My plan is to see how the patient progresses over the next 6-8 weeks and go from there.  At this point, the [facility] liver tractor was removed. There was no evidence of injury to the liver.  Pneumoperitoneum was released.  Remaining trocars were removed.  Prior to doing this, I did remove the 15 trocar and closed the fascial defect with 1 interrupted 0 Vicryl suture using the PMI suture passer with laparoscopic assistance. I then slightly enlarged the skin incision around the 15 trocar, the port was in this location.  The port was freed from surrounding  tissue and its fibrous capsule was mobilized as well and taken at its entirety. The port and the tubing that were connected to it were removed in its entirety.  There was no residual fibrous capsule left in the subcutaneous tissue.  I then closed the deep subcutaneous tissue with interrupted 2-0 Vicryl.  All skin incisions were closed with a 4-0 Monocryl followed by the application of benzoin, Steri-Strips, and sterile bandages.  All needle, instrument, and sponge counts were correct x2.  There were no immediate complications.  The patient tolerated the procedure well.     Pam SellaEric M. Andrey CampanileWilson, MD, FACS     EMW/MEDQ  D:  02/03/2018  T:  02/04/2018  Job:  [id number]

## 2018-02-04 NOTE — Discharge Instructions (Signed)
[facility] LAPAROSCOPIC SURGERY: POST OP INSTRUCTIONS Always review your discharge instruction sheet given to you by the facility where your surgery was performed. IF YOU HAVE DISABILITY OR FAMILY LEAVE FORMS, YOU MUST BRING THEM TO THE OFFICE FOR PROCESSING.   DO NOT GIVE THEM TO YOUR DOCTOR.  1. A prescription for pain medication may be given to you upon discharge - take this medication if your pain is not controlled after taking acetaminophen and/or ibuprofen.  Take your pain medication as prescribed, if needed.  If narcotic pain medicine is not needed, then you may take acetaminophen (Tylenol) or ibuprofen (Advil) as needed. 2. Take your usually prescribed medications unless otherwise directed. 3. If you need a refill on your pain medication, please contact your pharmacy.  They will contact our office to request authorization. Prescriptions will not be filled after 5pm or on week-ends. 4. You should follow a light full liquid diet the first few days after arrival home.  Be sure to include lots of fluids daily. You can gradually advance your diet to solids 5. Most patients will experience some swelling and bruising in the area of the incisions.  Ice packs will help.  Swelling and bruising can take several days to resolve.  6. It is common to experience some constipation if taking pain medication after surgery.  Increasing fluid intake and taking a stool softener (such as Colace) will usually help or prevent this problem from occurring.  A mild laxative (Milk of Magnesia or Miralax) should be taken according to package instructions if there are no bowel movements after 48 hours. 7. Unless discharge instructions indicate otherwise, you may remove your bandages 24-48 hours after surgery, and you may shower at that time.  You may have steri-strips (small skin tapes) in place directly over the incision.  These strips should be left on the skin for 7-10 days.  If your surgeon used  skin glue on the incision, you may shower in 24 hours.  The glue will flake off over the next 2-3 weeks.  Any sutures or staples will be removed at the office during your follow-up visit. 8. ACTIVITIES:  You may resume regular (light) daily activities beginning the next day--such as daily self-care, walking, climbing stairs--gradually increasing activities as tolerated.  You may have sexual intercourse when it is comfortable.  Refrain from any heavy lifting or straining until approved by your doctor. a. You may drive when you are no longer taking prescription pain medication, you can comfortably wear a seatbelt, and you can safely maneuver your car and apply brakes. 9. You should see your doctor in the office for a follow-up appointment approximately 2-3 weeks after your surgery.  Make sure that you call for this appointment within a day or two after you arrive home to insure a convenient appointment time. 10. OTHER INSTRUCTIONS:  WHEN TO CALL YOUR DOCTOR: 1. Fever over 101.0 2. Inability to urinate 3. Continued bleeding from incision. 4. Increased pain, redness, or drainage from the incision. 5. Increasing abdominal pain  The clinic staff is available to answer your questions during regular business hours.  Please dont hesitate to call and ask to speak to one of the nurses for clinical concerns.  If you have a medical emergency, go to the nearest emergency room or call 911.  A surgeon from [facility] is always on call at the hospital. [address], [address], [place]  [id number] ? [address] [address], [place]  [address] [phone] ? [phone] ? FAX [phone] Web site: [email or web address]

## 2018-02-04 NOTE — Progress Notes (Signed)
Discharge instructions reviewed with patient and patient verbalizes understanding.  Patient stable; pain well-controlled; denies N/V; awaiting ride.

## 2018-02-07 ENCOUNTER — Inpatient Hospital Stay (HOSPITAL_COMMUNITY): Payer: BC Managed Care – PPO

## 2018-02-07 ENCOUNTER — Encounter (HOSPITAL_COMMUNITY): Payer: Self-pay | Admitting: Hematology

## 2018-02-07 ENCOUNTER — Inpatient Hospital Stay (HOSPITAL_COMMUNITY): Payer: BC Managed Care – PPO | Attending: Hematology | Admitting: Hematology

## 2018-02-07 DIAGNOSIS — D509 Iron deficiency anemia, unspecified: Secondary | ICD-10-CM | POA: Diagnosis not present

## 2018-02-07 LAB — CBC
HEMATOCRIT: 32.9 % — AB (ref 36.0–46.0)
Hemoglobin: 10.2 g/dL — ABNORMAL LOW (ref 12.0–15.0)
MCH: 24.9 pg — ABNORMAL LOW (ref 26.0–34.0)
MCHC: 31 g/dL (ref 30.0–36.0)
MCV: 80.2 fL (ref 78.0–100.0)
Platelets: 279 10*3/uL (ref 150–400)
RBC: 4.1 MIL/uL (ref 3.87–5.11)
RDW: 18.8 % — AB (ref 11.5–15.5)
WBC: 6 10*3/uL (ref 4.0–10.5)

## 2018-02-07 LAB — IRON AND TIBC
Iron: 29 ug/dL (ref 28–170)
Saturation Ratios: 8 % — ABNORMAL LOW (ref 10.4–31.8)
TIBC: 357 ug/dL (ref 250–450)
UIBC: 328 ug/dL

## 2018-02-07 LAB — RETICULOCYTES
RBC.: 4.1 MIL/uL (ref 3.87–5.11)
Retic Count, Absolute: 65.6 10*3/uL (ref 19.0–186.0)
Retic Ct Pct: 1.6 % (ref 0.4–3.1)

## 2018-02-07 LAB — FOLATE: FOLATE: 8.9 ng/mL (ref >5.9)

## 2018-02-07 LAB — LACTATE DEHYDROGENASE: LDH: 137 U/L (ref 98–192)

## 2018-02-07 LAB — VITAMIN B12: Vitamin B-12: 200 pg/mL (ref 180–914)

## 2018-02-07 LAB — FERRITIN: Ferritin: 17 ng/mL (ref 11–307)

## 2018-02-07 NOTE — Assessment & Plan Note (Signed)
1.  Iron deficiency anemia: She has iron deficiency anemia in the setting of gastric band.  Likely causes malabsorption related to vomiting and heartburn.  She was started on iron tablets 2 tablets every other day on 01/09/2018.  Her hemoglobin showed improvement from 10.4 on March 18 to 11.3 on 01/30/2018.  MCV also improved from 75-80 during this time.  She apparently received parenteral iron in 2014 and felt much better.  She denied any vomiting since she had a laparoscopic removal of gastric band for slippage on 02/01/2018.  However she continues to feel lightheaded during walking and when she bends forward.  She also gets tired easily on exertion.  She has ice pica for the last 6-8 months.  She denies any bleeding per rectum or melena.  She is no longer menstruating as she had hysterectomy in 2014.  Because of her symptoms, I have recommended parenteral iron in the form of Feraheme given weekly for 2 weeks.  I discussed the side effects of Feraheme including rare chance of serious allergic reactions in detail.  She gives us permission to proceed with the treatment.  I will see her back in 6 weeks with repeat labs.  I will also check her B12 and folic acid levels to rule out combination anemia.

## 2018-02-07 NOTE — Progress Notes (Signed)
CONSULT NOTE  Patient Care Team: Lucille Passy, MD as PCP - General (Family Medicine)  CHIEF COMPLAINTS/PURPOSE OF CONSULTATION:  Iron deficiency anemia  HISTORY OF PRESENTING ILLNESS:  [name] 42 y.o. female is seen in consultation today for further management of iron deficiency anemia.  She had adjustable gastric band placed in December 2011 and lost 70 pounds.  Lately she has been having heartburn and vomiting.  On 02/01/2018 she underwent laparoscopic removal of the band for slippage.  Her hemoglobin in March was 10.4 with a MCV of 75.  She was started on iron tablets 2 tabs every other day on 01/09/2018.  Her most recent CBC on 01/30/2018 showed improvement of hemoglobin to 11.3.  Her MCV also improved to 80.  She denies any chest pains but has shortness of breath on exertion.  She also reports lightheadedness and palpitations on walking.  She denies any hematuria, bleeding per rectum or melena.  She never had a colonoscopy in the past.  She denies any blood transfusions in the past.  She had received parenteral iron in 2014. No prior personal history of malignancy reported.  She no longer has menses as she had hysterectomy in 2014.  She has some numbness in the right thigh because of a slipped disc at L2.  She has been eating ice for the last 6-8 months.  Denies any fevers, night sweats or weight loss.  MEDICAL HISTORY:  Past Medical History:  Diagnosis Date  . Anemia   . Arthritis   . Asthma    EXERCISE INDUCED  . Bursitis of hip   . Depression   . Generalized headaches   . Irregular menses   . Obesity (BMI 30-39.9)     SURGICAL HISTORY: Past Surgical History:  Procedure Laterality Date  . ABDOMINAL HYSTERECTOMY    . ACHILLES TENDON LENGTHENING  1989  . ANKLE GANGLION CYST EXCISION  02/2012   left ankle  . FEET SURGERY  1990   INSERTION OF BONE GRAFT IN FEET  . LAPAROSCOPIC GASTRIC BANDING  10/17/10   Dr Greer Pickerel; [facility]  . LIPOMA EXCISION  02/2006   abdomen     SOCIAL HISTORY: Social History   Socioeconomic History  . Marital status: Married    Spouse name: Not on file  . Number of children: Not on file  . Years of education: Not on file  . Highest education level: Not on file  Occupational History  . Not on file  Social Needs  . Financial resource strain: Not on file  . Food insecurity:    Worry: Not on file    Inability: Not on file  . Transportation needs:    Medical: Not on file    Non-medical: Not on file  Tobacco Use  . Smoking status: Never Smoker  . Smokeless tobacco: Never Used  Substance and Sexual Activity  . Alcohol use: No  . Drug use: No  . Sexual activity: Yes  Lifestyle  . Physical activity:    Days per week: Not on file    Minutes per session: Not on file  . Stress: Not on file  Relationships  . Social connections:    Talks on phone: Not on file    Gets together: Not on file    Attends religious service: Not on file    Active member of club or organization: Not on file    Attends meetings of clubs or organizations: Not on file    Relationship status:  Not on file  . Intimate partner violence:    Fear of current or ex partner: Not on file    Emotionally abused: Not on file    Physically abused: Not on file    Forced sexual activity: Not on file  Other Topics Concern  . Not on file  Social History Narrative  . Not on file    FAMILY HISTORY: Family History  Problem Relation Age of Onset  . Heart disease Maternal Grandmother        V. Fib arrest  . Hypertension Mother   . Hyperlipidemia Mother   . Hypertension Father   . Hyperlipidemia Father   . Heart disease Father   . Diabetes Father   . Cancer Father        skin  . Diabetes Sister   . Hypertension Sister   . Cancer Maternal Grandfather        lung and skin  . Cancer Paternal Grandfather        stomach and skin    ALLERGIES:  is allergic to cashew nut oil; cauliflower [brassica oleracea italica]; septra ds  [sulfamethoxazole-trimethoprim]; whey; orange fruit [citrus]; augmentin [amoxicillin-pot clavulanate]; codeine; milk-related compounds; other; and tree extract.  MEDICATIONS:  Current Outpatient Medications  Medication Sig Dispense Refill  . albuterol (PROAIR HFA) 108 (90 BASE) MCG/ACT inhaler Inhale 2 puffs into the lungs every 6 (six) hours as needed for wheezing.    Marland Kitchen buPROPion (WELLBUTRIN XL) 150 MG 24 hr tablet Take 300 mg by mouth daily.     . butalbital-acetaminophen-caffeine (FIORICET, ESGIC) 50-325-40 MG tablet Take 1 tablet by mouth 2 (two) times daily as needed for headache.    Marland Kitchen EPINEPHrine (EPIPEN) 0.3 mg/0.3 mL DEVI Inject 0.3 mLs (0.3 mg total) into the muscle once. 1 Device 0  . Ferrous Sulfate (IRON) 142 (45 Fe) MG TBCR Take 2 tablets by mouth every other day. 30 tablet 1  . fexofenadine-pseudoephedrine (ALLEGRA-D 24) 180-240 MG per 24 hr tablet Take 1 tablet by mouth at bedtime as needed (allergies).     . fluticasone (FLONASE) 50 MCG/ACT nasal spray Place 1 spray into both nostrils daily as needed for allergies.     Marland Kitchen gabapentin (NEURONTIN) 300 MG capsule Take 300 mg by mouth at bedtime.     . naproxen sodium (ALEVE) 220 MG tablet Take 220-440 mg by mouth daily as needed (pain).    Marland Kitchen oxyCODONE (OXY IR/ROXICODONE) 5 MG immediate release tablet Take 1 tablet (5 mg total) by mouth every 6 (six) hours as needed for moderate pain. 12 tablet 0  . pantoprazole (PROTONIX) 40 MG tablet Take 40 mg by mouth daily.     Marland Kitchen trolamine salicylate (ASPERCREME) 10 % cream Apply 1 application topically as needed for muscle pain.     No current facility-administered medications for this visit.     REVIEW OF SYSTEMS:   Constitutional: Denies fevers, chills or abnormal night sweats.  She has some fatigue on exertion. Eyes: Denies blurriness of vision, double vision or watery eyes Ears, nose, mouth, throat, and face: Denies mucositis or sore throat Respiratory: Denies cough, or wheezes.  She has  dyspnea on exertion. Cardiovascular: Denies palpitation, chest discomfort or lower extremity swelling Gastrointestinal:  Denies nausea, heartburn or change in bowel habits Skin: Denies abnormal skin rashes Lymphatics: Denies new lymphadenopathy or easy bruising Neurological:Denies numbness, tingling or new weaknesses.  She has lightheadedness. Behavioral/Psych: Mood is stable, no new changes  All other systems were reviewed with the patient and  are negative.  PHYSICAL EXAMINATION: ECOG PERFORMANCE STATUS: 0 - Asymptomatic  Vitals:   02/07/18 1119  BP: (!) 134/55  Pulse: 84  Resp: 18  Temp: 98.2 F (36.8 C)  SpO2: 99%   Filed Weights   02/07/18 1119  Weight: 230 lb (104.3 kg)    GENERAL:alert, no distress and comfortable SKIN: skin color, texture, turgor are normal, no rashes or significant lesions EYES: normal, conjunctiva are pink and non-injected, sclera clear OROPHARYNX:no exudate, no erythema and lips, buccal mucosa, and tongue normal  NECK: supple, thyroid normal size, non-tender, without nodularity LYMPH:  no palpable lymphadenopathy in the cervical, axillary or inguinal LUNGS: clear to auscultation and percussion with normal breathing effort HEART: regular rate & rhythm and no murmurs and no lower extremity edema ABDOMEN:abdomen soft, non-tender and normal bowel sounds.  Recent scars from laparoscopic surgery are healing well. Musculoskeletal:no cyanosis of digits and no clubbing  PSYCH: alert & oriented x 3 with fluent speech NEURO: no focal motor/sensory deficits  LABORATORY DATA:  I have reviewed the data as listed Recent Results (from the past 2160 hour(s))  Basic metabolic panel     Status: Abnormal   Collection Time: 01/09/18  4:51 PM  Result Value Ref Range   Sodium 137 135 - 145 mmol/L   Potassium 3.7 3.5 - 5.1 mmol/L   Chloride 104 101 - 111 mmol/L   CO2 27 22 - 32 mmol/L   Glucose, Bld 99 65 - 99 mg/dL   BUN 10 6 - 20 mg/dL   Creatinine, Ser 0.81  0.44 - 1.00 mg/dL   Calcium 8.5 (L) 8.9 - 10.3 mg/dL   GFR calc non Af Amer >60 >60 mL/min   GFR calc Af Amer >60 >60 mL/min    Comment: (NOTE) The eGFR has been calculated using the CKD EPI equation. This calculation has not been validated in all clinical situations. eGFR's persistently <60 mL/min signify possible Chronic Kidney Disease.    Anion gap 6 5 - 15    Comment: Performed at [facility], [address] [place], [place] [address]  CBC     Status: Abnormal   Collection Time: 01/09/18  4:51 PM  Result Value Ref Range   WBC 9.6 3.6 - 11.0 K/uL   RBC 4.32 3.80 - 5.20 MIL/uL   Hemoglobin 10.1 (L) 12.0 - 16.0 g/dL   HCT 31.5 (L) 35.0 - 47.0 %   MCV 72.9 (L) 80.0 - 100.0 fL   MCH 23.5 (L) 26.0 - 34.0 pg   MCHC 32.2 32.0 - 36.0 g/dL   RDW 16.5 (H) 11.5 - 14.5 %   Platelets 340 150 - 440 K/uL    Comment: Performed at [facility], [address] [place], [place] [address]  Urinalysis, Complete w Microscopic     Status: Abnormal   Collection Time: 01/09/18  4:51 PM  Result Value Ref Range   Color, Urine STRAW (A) YELLOW   APPearance CLEAR (A) CLEAR   Specific Gravity, Urine 1.003 (L) 1.005 - 1.030   pH 8.0 5.0 - 8.0   Glucose, UA NEGATIVE NEGATIVE mg/dL   Hgb urine dipstick NEGATIVE NEGATIVE   Bilirubin Urine NEGATIVE NEGATIVE   Ketones, ur NEGATIVE NEGATIVE mg/dL   Protein, ur NEGATIVE NEGATIVE mg/dL   Nitrite NEGATIVE NEGATIVE   Leukocytes, UA NEGATIVE NEGATIVE   RBC / HPF 0-5 0 - 5 RBC/hpf   WBC, UA 0-5 0 - 5 WBC/hpf   Bacteria, UA NONE SEEN NONE SEEN  Squamous Epithelial / LPF 0-5 (A) NONE SEEN    Comment: Performed at [facility], [address] [place], [place] [address]  Troponin I     Status: None   Collection Time: 01/09/18  4:51 PM  Result Value Ref Range   Troponin I <0.03 <0.03 ng/mL    Comment: Performed at [facility], [address] [place], [place] [address]  Glucose, capillary     Status: None    Collection Time: 01/09/18  4:56 PM  Result Value Ref Range   Glucose-Capillary 96 65 - 99 mg/dL  Pregnancy, urine POC     Status: None   Collection Time: 01/09/18  5:06 PM  Result Value Ref Range   Preg Test, Ur NEGATIVE NEGATIVE    Comment:        THE SENSITIVITY OF THIS METHODOLOGY IS >24 mIU/mL   CBC     Status: Abnormal   Collection Time: 01/13/18 11:51 AM  Result Value Ref Range   WBC 7.5 4.0 - 10.5 K/uL   RBC 4.37 3.87 - 5.11 Mil/uL   Platelets 382.0 150.0 - 400.0 K/uL   Hemoglobin 10.4 (L) 12.0 - 15.0 g/dL   HCT 32.8 (L) 36.0 - 46.0 %   MCV 75.1 (L) 78.0 - 100.0 fl   MCHC 31.6 30.0 - 36.0 g/dL   RDW 16.5 (H) 11.5 - 15.5 %  Ferritin     Status: Abnormal   Collection Time: 01/13/18 11:51 AM  Result Value Ref Range   Ferritin 7.0 (L) 10.0 - 291.0 ng/mL  ABO/Rh     Status: None   Collection Time: 01/30/18  8:55 AM  Result Value Ref Range   ABO/RH(D)      B NEG Performed at [facility], [place] [address] [place], [place] [address]   Comprehensive metabolic panel     Status: Abnormal   Collection Time: 01/30/18  9:03 AM  Result Value Ref Range   Sodium 139 135 - 145 mmol/L   Potassium 4.3 3.5 - 5.1 mmol/L   Chloride 105 101 - 111 mmol/L   CO2 28 22 - 32 mmol/L   Glucose, Bld 88 65 - 99 mg/dL   BUN 7 6 - 20 mg/dL   Creatinine, Ser 0.90 0.44 - 1.00 mg/dL   Calcium 8.8 (L) 8.9 - 10.3 mg/dL   Total Protein 7.0 6.5 - 8.1 g/dL   Albumin 3.8 3.5 - 5.0 g/dL   AST 17 15 - 41 U/L   ALT 15 14 - 54 U/L   Alkaline Phosphatase 89 38 - 126 U/L   Total Bilirubin 0.6 0.3 - 1.2 mg/dL   GFR calc non Af Amer >60 >60 mL/min   GFR calc Af Amer >60 >60 mL/min    Comment: (NOTE) The eGFR has been calculated using the CKD EPI equation. This calculation has not been validated in all clinical situations. eGFR's persistently <60 mL/min signify possible Chronic Kidney Disease.    Anion gap 6 5 - 15    Comment: Performed at [facility], [place]  [address] [place], [place] [address]  Type and screen     Status: None   Collection Time: 01/30/18  9:03 AM  Result Value Ref Range   ABO/RH(D) B NEG    Antibody Screen NEG    Sample Expiration 02/06/2018    Extend sample reason      NO TRANSFUSIONS OR PREGNANCY IN THE PAST 3 MONTHS Performed at [facility], [address] [place], [place] [address]   CBC     Status: Abnormal   Collection Time: 01/30/18  9:03 AM  Result Value Ref Range   WBC 3.8 (L) 4.0 - 10.5 K/uL   RBC 4.69 3.87 - 5.11 MIL/uL   Hemoglobin 11.3 (L) 12.0 - 15.0 g/dL   HCT 37.7 36.0 - 46.0 %   MCV 80.4 78.0 - 100.0 fL   MCH 24.1 (L) 26.0 - 34.0 pg   MCHC 30.0 30.0 - 36.0 g/dL   RDW 18.8 (H) 11.5 - 15.5 %   Platelets 294 150 - 400 K/uL    Comment: Performed at [facility], [place] [address], [place], [place] [address]  CBC     Status: Abnormal   Collection Time: 02/07/18 12:51 PM  Result Value Ref Range   WBC 6.0 4.0 - 10.5 K/uL   RBC 4.10 3.87 - 5.11 MIL/uL   Hemoglobin 10.2 (L) 12.0 - 15.0 g/dL   HCT 32.9 (L) 36.0 - 46.0 %   MCV 80.2 78.0 - 100.0 fL   MCH 24.9 (L) 26.0 - 34.0 pg   MCHC 31.0 30.0 - 36.0 g/dL   RDW 18.8 (H) 11.5 - 15.5 %   Platelets 279 150 - 400 K/uL    Comment: Performed at [facility], [address], [place], [place] [address]  Reticulocytes     Status: None   Collection Time: 02/07/18 12:51 PM  Result Value Ref Range   Retic Ct Pct 1.6 0.4 - 3.1 %   RBC. 4.10 3.87 - 5.11 MIL/uL   Retic Count, Absolute 65.6 19.0 - 186.0 K/uL    Comment: Performed at [facility], [address], [place], [place] [address]  Lactate dehydrogenase     Status: None   Collection Time: 02/07/18 12:51 PM  Result Value Ref Range   LDH 137 98 - 192 U/L    Comment: Performed at [facility], [address], [place], [place] [address]      ASSESSMENT & PLAN:  Iron deficiency anemia 1.  Iron deficiency anemia: She has iron deficiency anemia in the setting of gastric  band.  Likely causes malabsorption related to vomiting and heartburn.  She was started on iron tablets 2 tablets every other day on 01/09/2018.  Her hemoglobin showed improvement from 10.4 on March 18 to 11.3 on 01/30/2018.  MCV also improved from 75-80 during this time.  She apparently received parenteral iron in 2014 and felt much better.  She denied any vomiting since she had a laparoscopic removal of gastric band for slippage on 02/01/2018.  However she continues to feel lightheaded during walking and when she bends forward.  She also gets tired easily on exertion.  She has ice pica for the last 6-8 months.  She denies any bleeding per rectum or melena.  She is no longer menstruating as she had hysterectomy in 2014.  Because of her symptoms, I have recommended parenteral iron in the form of Feraheme given weekly for 2 weeks.  I discussed the side effects of Feraheme including rare chance of serious allergic reactions in detail.  She gives [place] permission to proceed with the treatment.  I will see her back in 6 weeks with repeat labs.  I will also check her O70 and folic acid levels to rule out combination anemia.     All questions were answered. The patient knows to call the clinic with any problems, questions or concerns.     Derek Jack, MD  02/07/18 5:15 PM

## 2018-02-08 LAB — HAPTOGLOBIN: Haptoglobin: 127 mg/dL (ref 34–200)

## 2018-02-13 ENCOUNTER — Inpatient Hospital Stay (HOSPITAL_COMMUNITY): Payer: BC Managed Care – PPO

## 2018-02-13 ENCOUNTER — Encounter (HOSPITAL_COMMUNITY): Payer: Self-pay

## 2018-02-13 VITALS — BP 116/63 | HR 76 | Temp 97.8°F | Resp 18

## 2018-02-13 DIAGNOSIS — D509 Iron deficiency anemia, unspecified: Secondary | ICD-10-CM

## 2018-02-13 MED ORDER — SODIUM CHLORIDE 0.9 % IV SOLN
Freq: Once | INTRAVENOUS | Status: AC
  Administered 2018-02-13: 13:00:00 via INTRAVENOUS

## 2018-02-13 MED ORDER — SODIUM CHLORIDE 0.9 % IV SOLN
510.0000 mg | Freq: Once | INTRAVENOUS | Status: AC
  Administered 2018-02-13: 510 mg via INTRAVENOUS
  Filled 2018-02-13: qty 17

## 2018-02-13 NOTE — Patient Instructions (Signed)
[facility] at [facility] Discharge Instructions  Received Feraheme infusion today. Follow-up as scheduled. Call clinic for any questions or concerns   Thank you for choosing [facility] at [facility] to provide your oncology and hematology care.  To afford each patient quality time with our provider, please arrive at least 15 minutes before your scheduled appointment time.   If you have a lab appointment with the Cancer Center please come in thru the  Main Entrance and check in at the main information desk  You need to re-schedule your appointment should you arrive 10 or more minutes late.  We strive to give you quality time with our providers, and arriving late affects you and other patients whose appointments are after yours.  Also, if you no show three or more times for appointments you may be dismissed from the clinic at the providers discretion.     Again, thank you for choosing [facility].  Our hope is that these requests will decrease the amount of time that you wait before being seen by our physicians.       _____________________________________________________________  Should you have questions after your visit to [facility], please contact our office at [phone] between the hours of 8:30 a.m. and 4:30 p.m.  Voicemails left after 4:30 p.m. will not be returned until the following business day.  For prescription refill requests, have your pharmacy contact our office.       Resources For Cancer Patients and their Caregivers ? American Cancer Society: Can assist with transportation, wigs, general needs, runs Look Good Feel Better.        1-888-227-6333 ? Cancer Care: Provides financial assistance, online support groups, medication/co-pay assistance.  1-800-813-HOPE (4673) ? [facility] Assists [place] cancer patients and their families through emotional , educational and  financial support.  336-427-4357 ? [place] DSS Where to apply for food stamps, Medicaid and utility assistance. 336-342-1394 ? [facility]: Transportation to medical appointments. 336-347-2287 ? Social Security Administration: May apply for disability if have a Stage IV cancer. 336-342-7796 1-800-772-1213 ? [place] Aging, Disability and Transit Services: Assists with nutrition, care and transit needs. 336-349-2343  Cancer Center Support Programs:   > Cancer Support Group  2nd Tuesday of the month 1pm-2pm, Journey Room   > Creative Journey  3rd Tuesday of the month 1130am-1pm, Journey Room    

## 2018-02-13 NOTE — Progress Notes (Signed)
[name] tolerated Feraheme infusion well without complaints or incident. VSS upon discharge. Pt discharged self ambulatory in satisfactory condition

## 2018-02-20 ENCOUNTER — Inpatient Hospital Stay (HOSPITAL_COMMUNITY): Payer: BC Managed Care – PPO

## 2018-02-20 ENCOUNTER — Encounter (HOSPITAL_COMMUNITY): Payer: Self-pay

## 2018-02-20 ENCOUNTER — Other Ambulatory Visit (HOSPITAL_COMMUNITY): Payer: Self-pay | Admitting: Pharmacist

## 2018-02-20 VITALS — BP 121/62 | HR 78 | Temp 97.7°F | Resp 18

## 2018-02-20 DIAGNOSIS — D509 Iron deficiency anemia, unspecified: Secondary | ICD-10-CM | POA: Diagnosis not present

## 2018-02-20 MED ORDER — SODIUM CHLORIDE 0.9 % IV SOLN
510.0000 mg | Freq: Once | INTRAVENOUS | Status: AC
  Administered 2018-02-20: 510 mg via INTRAVENOUS
  Filled 2018-02-20: qty 17

## 2018-02-20 MED ORDER — SODIUM CHLORIDE 0.9 % IV SOLN
Freq: Once | INTRAVENOUS | Status: AC
  Administered 2018-02-20: 13:00:00 via INTRAVENOUS

## 2018-02-20 NOTE — Patient Instructions (Signed)
[facility] at [facility] Discharge Instructions  Received Feraheme infusion today. Follow-up as scheduled. Call clinic for any questions or concerns   Thank you for choosing [facility] at [facility] to provide your oncology and hematology care.  To afford each patient quality time with our provider, please arrive at least 15 minutes before your scheduled appointment time.   If you have a lab appointment with the Cancer Center please come in thru the  Main Entrance and check in at the main information desk  You need to re-schedule your appointment should you arrive 10 or more minutes late.  We strive to give you quality time with our providers, and arriving late affects you and other patients whose appointments are after yours.  Also, if you no show three or more times for appointments you may be dismissed from the clinic at the providers discretion.     Again, thank you for choosing [facility].  Our hope is that these requests will decrease the amount of time that you wait before being seen by our physicians.       _____________________________________________________________  Should you have questions after your visit to [facility], please contact our office at [phone] between the hours of 8:30 a.m. and 4:30 p.m.  Voicemails left after 4:30 p.m. will not be returned until the following business day.  For prescription refill requests, have your pharmacy contact our office.       Resources For Cancer Patients and their Caregivers ? American Cancer Society: Can assist with transportation, wigs, general needs, runs Look Good Feel Better.        1-888-227-6333 ? Cancer Care: Provides financial assistance, online support groups, medication/co-pay assistance.  1-800-813-HOPE (4673) ? [facility] Assists [place] cancer patients and their families through emotional , educational and  financial support.  336-427-4357 ? [place] DSS Where to apply for food stamps, Medicaid and utility assistance. 336-342-1394 ? [facility]: Transportation to medical appointments. 336-347-2287 ? Social Security Administration: May apply for disability if have a Stage IV cancer. 336-342-7796 1-800-772-1213 ? [place] Aging, Disability and Transit Services: Assists with nutrition, care and transit needs. 336-349-2343  Cancer Center Support Programs:   > Cancer Support Group  2nd Tuesday of the month 1pm-2pm, Journey Room   > Creative Journey  3rd Tuesday of the month 1130am-1pm, Journey Room    

## 2018-02-20 NOTE — Progress Notes (Signed)
Pam AmisBeverly T Jaffe tolerated Feraheme infusion well without complaints or incident. VSS after infusion Pt discharged self ambulatory in satisfactory condition

## 2018-03-19 ENCOUNTER — Inpatient Hospital Stay (HOSPITAL_COMMUNITY): Payer: BC Managed Care – PPO | Attending: Hematology

## 2018-03-19 DIAGNOSIS — E538 Deficiency of other specified B group vitamins: Secondary | ICD-10-CM | POA: Insufficient documentation

## 2018-03-19 DIAGNOSIS — D509 Iron deficiency anemia, unspecified: Secondary | ICD-10-CM | POA: Diagnosis present

## 2018-03-19 LAB — CBC
HCT: 37.4 % (ref 36.0–46.0)
Hemoglobin: 12.1 g/dL (ref 12.0–15.0)
MCH: 27.6 pg (ref 26.0–34.0)
MCHC: 32.4 g/dL (ref 30.0–36.0)
MCV: 85.2 fL (ref 78.0–100.0)
PLATELETS: 234 10*3/uL (ref 150–400)
RBC: 4.39 MIL/uL (ref 3.87–5.11)
RDW: 19.9 % — ABNORMAL HIGH (ref 11.5–15.5)
WBC: 5.8 10*3/uL (ref 4.0–10.5)

## 2018-03-19 LAB — IRON AND TIBC
Iron: 107 ug/dL (ref 28–170)
Saturation Ratios: 39 % — ABNORMAL HIGH (ref 10.4–31.8)
TIBC: 276 ug/dL (ref 250–450)
UIBC: 169 ug/dL

## 2018-03-19 LAB — FERRITIN: FERRITIN: 86 ng/mL (ref 11–307)

## 2018-03-21 ENCOUNTER — Encounter (HOSPITAL_COMMUNITY): Payer: Self-pay | Admitting: Hematology

## 2018-03-21 ENCOUNTER — Inpatient Hospital Stay (HOSPITAL_COMMUNITY): Payer: BC Managed Care – PPO | Attending: Hematology | Admitting: Hematology

## 2018-03-21 ENCOUNTER — Other Ambulatory Visit: Payer: Self-pay

## 2018-03-21 DIAGNOSIS — Z79899 Other long term (current) drug therapy: Secondary | ICD-10-CM

## 2018-03-21 DIAGNOSIS — Z801 Family history of malignant neoplasm of trachea, bronchus and lung: Secondary | ICD-10-CM | POA: Diagnosis not present

## 2018-03-21 DIAGNOSIS — K909 Intestinal malabsorption, unspecified: Secondary | ICD-10-CM | POA: Diagnosis not present

## 2018-03-21 DIAGNOSIS — D509 Iron deficiency anemia, unspecified: Secondary | ICD-10-CM

## 2018-03-21 DIAGNOSIS — R42 Dizziness and giddiness: Secondary | ICD-10-CM | POA: Diagnosis not present

## 2018-03-21 DIAGNOSIS — Z808 Family history of malignant neoplasm of other organs or systems: Secondary | ICD-10-CM | POA: Insufficient documentation

## 2018-03-21 DIAGNOSIS — R5383 Other fatigue: Secondary | ICD-10-CM

## 2018-03-21 DIAGNOSIS — E538 Deficiency of other specified B group vitamins: Secondary | ICD-10-CM

## 2018-03-21 MED ORDER — CYANOCOBALAMIN 1000 MCG/ML IJ SOLN
INTRAMUSCULAR | Status: AC
  Filled 2018-03-21: qty 1

## 2018-03-21 MED ORDER — CYANOCOBALAMIN 1000 MCG/ML IJ SOLN
1000.0000 ug | Freq: Once | INTRAMUSCULAR | Status: AC
  Administered 2018-03-21: 1000 ug via INTRAMUSCULAR

## 2018-03-21 NOTE — Assessment & Plan Note (Signed)
1.  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 - Status post Feraheme on 02/13/2018 and 02/20/2018 with improvement in symptoms (lightheadedness while walking and bending forward) - Hemoglobin improved to 12.1 from 10.2.  Ferritin improved from 17-86. -We will recheck her CBC, ferritin and iron panel in 3 months.  2. B12 deficiency: -B12 level was 200 on 02/07/2018.  She will be given B12 shot today.  I have asked her to take B12 1 tablet daily.  We will recheck her levels in 3 months.  If there is no improvement, will put her on B12 injections.

## 2018-03-21 NOTE — Progress Notes (Signed)
[facility] [address], [place] [address]   CLINIC:  Medical Oncology/Hematology  PCP:  Dianne Dun, MD [address] [place] [address] [phone]   REASON FOR VISIT:  Follow-up for iron deficiency anemia and B12 deficiency.  CURRENT THERAPY: Intermittent Feraheme infusions.    INTERVAL HISTORY:  [name] 42 y.o. female returns for follow-up of iron deficiency anemia.  She is taking 1 iron tablet daily and is tolerating it very well.  She started them in March 2019.  Since she has gotten Feraheme infusion in April, there is tremendous improvement in her tiredness and dizziness.  She denies any fevers or night sweats.  She denies any tingling or numbness in the extremities.  REVIEW OF SYSTEMS:  Review of Systems  Constitutional: Positive for fatigue.  All other systems reviewed and are negative.    PAST MEDICAL/SURGICAL HISTORY:  Past Medical History:  Diagnosis Date  . Anemia   . Arthritis   . Asthma    EXERCISE INDUCED  . Bursitis of hip   . Depression   . Generalized headaches   . Irregular menses   . Obesity (BMI 30-39.9)    Past Surgical History:  Procedure Laterality Date  . ABDOMINAL HYSTERECTOMY    . ACHILLES TENDON LENGTHENING  1989  . ANKLE GANGLION CYST EXCISION  02/2012   left ankle  . FEET SURGERY  1990   INSERTION OF BONE GRAFT IN FEET  . LAPAROSCOPIC GASTRIC BANDING  10/17/10   Dr Gaynelle Adu; AP Standard  . LIPOMA EXCISION  02/2006   abdomen     SOCIAL HISTORY:  Social History   Socioeconomic History  . Marital status: Married    Spouse name: Not on file  . Number of children: Not on file  . Years of education: Not on file  . Highest education level: Not on file  Occupational History  . Not on file  Social Needs  . Financial resource strain: Not on file  . Food insecurity:    Worry: Not on file    Inability: Not on file  . Transportation needs:    Medical: Not on file    Non-medical:  Not on file  Tobacco Use  . Smoking status: Never Smoker  . Smokeless tobacco: Never Used  Substance and Sexual Activity  . Alcohol use: No  . Drug use: No  . Sexual activity: Yes  Lifestyle  . Physical activity:    Days per week: Not on file    Minutes per session: Not on file  . Stress: Not on file  Relationships  . Social connections:    Talks on phone: Not on file    Gets together: Not on file    Attends religious service: Not on file    Active member of club or organization: Not on file    Attends meetings of clubs or organizations: Not on file    Relationship status: Not on file  . Intimate partner violence:    Fear of current or ex partner: Not on file    Emotionally abused: Not on file    Physically abused: Not on file    Forced sexual activity: Not on file  Other Topics Concern  . Not on file  Social History Narrative  . Not on file    FAMILY HISTORY:  Family History  Problem Relation Age of Onset  . Heart disease Maternal Grandmother        V. Fib arrest  .  Hypertension Mother   . Hyperlipidemia Mother   . Hypertension Father   . Hyperlipidemia Father   . Heart disease Father   . Diabetes Father   . Cancer Father        skin  . Diabetes Sister   . Hypertension Sister   . Cancer Maternal Grandfather        lung and skin  . Cancer Paternal Grandfather        stomach and skin    CURRENT MEDICATIONS:  Outpatient Encounter Medications as of 03/21/2018  Medication Sig Note  . albuterol (PROAIR HFA) 108 (90 BASE) MCG/ACT inhaler Inhale 2 puffs into the lungs every 6 (six) hours as needed for wheezing.   Marland Kitchen buPROPion (WELLBUTRIN XL) 150 MG 24 hr tablet Take 300 mg by mouth daily.    . butalbital-acetaminophen-caffeine (FIORICET, ESGIC) 50-325-40 MG tablet Take 1 tablet by mouth 2 (two) times daily as needed for headache.   Marland Kitchen EPINEPHrine (EPIPEN) 0.3 mg/0.3 mL DEVI Inject 0.3 mLs (0.3 mg total) into the muscle once. 02/03/2018: Never had to use    . Ferrous  Sulfate (IRON) 142 (45 Fe) MG TBCR Take 2 tablets by mouth every other day.   . fexofenadine-pseudoephedrine (ALLEGRA-D 24) 180-240 MG per 24 hr tablet Take 1 tablet by mouth at bedtime as needed (allergies).    . fluticasone (FLONASE) 50 MCG/ACT nasal spray Place 1 spray into both nostrils daily as needed for allergies.    Marland Kitchen gabapentin (NEURONTIN) 300 MG capsule Take 300 mg by mouth at bedtime.    . naproxen sodium (ALEVE) 220 MG tablet Take 220-440 mg by mouth daily as needed (pain).   . pantoprazole (PROTONIX) 40 MG tablet Take 40 mg by mouth daily.    Marland Kitchen trolamine salicylate (ASPERCREME) 10 % cream Apply 1 application topically as needed for muscle pain.   Marland Kitchen oxyCODONE (OXY IR/ROXICODONE) 5 MG immediate release tablet Take 1 tablet (5 mg total) by mouth every 6 (six) hours as needed for moderate pain. (Patient not taking: Reported on 03/21/2018)    Facility-Administered Encounter Medications as of 03/21/2018  Medication  . cyanocobalamin ((VITAMIN B-12)) injection 1,000 mcg    ALLERGIES:  Allergies  Allergen Reactions  . Cashew Nut Oil Anaphylaxis  . Cauliflower [Brassica Oleracea Italica] Anaphylaxis  . Septra Ds [Sulfamethoxazole-Trimethoprim]     Hives, SOB, itching  . Whey Anaphylaxis  . Orange Fruit [Citrus] Other (See Comments)    Migraines   . Augmentin [Amoxicillin-Pot Clavulanate] Diarrhea and Nausea And Vomiting    Has patient had a PCN reaction causing immediate rash, facial/tongue/throat swelling, SOB or lightheadedness with hypotension: No Has patient had a PCN reaction causing severe rash involving mucus membranes or skin necrosis: No Has patient had a PCN reaction that required hospitalization: No Has patient had a PCN reaction occurring within the last 10 years: Unknown If all of the above answers are "NO", then may proceed with Cephalosporin use.   . Codeine     rash  . Milk-Related Compounds   . Other     epidural steroid injection - severe headaches, high fever    . Tree Extract Itching    Tee tree oil     PHYSICAL EXAM:  ECOG Performance status: 0   Physical Exam Deferred.  LABORATORY DATA:  I have reviewed the labs as listed.  CBC    Component Value Date/Time   WBC 5.8 03/19/2018 1551   RBC 4.39 03/19/2018 1551   HGB 12.1 03/19/2018 1551  HGB 13.7 10/13/2013 1524   HCT 37.4 03/19/2018 1551   HCT 40.9 10/13/2013 1524   PLT 234 03/19/2018 1551   PLT 270 10/13/2013 1524   MCV 85.2 03/19/2018 1551   MCV 88 10/13/2013 1524   MCH 27.6 03/19/2018 1551   MCHC 32.4 03/19/2018 1551   RDW 19.9 (H) 03/19/2018 1551   RDW 13.3 10/13/2013 1524   LYMPHSABS 2.2 10/13/2013 1524   MONOABS 0.4 10/13/2013 1524   EOSABS 0.3 10/13/2013 1524   BASOSABS 0.1 10/13/2013 1524   CMP Latest Ref Rng & Units 01/30/2018 01/09/2018 02/25/2013  Glucose 65 - 99 mg/dL 88 99 98  BUN 6 - 20 mg/dL Creatinine 0.44 - 1.00 mg/dL 1.61 0.96 0.8  Sodium 045 - 145 mmol/L 139 137 136  Potassium 3.5 - 5.1 mmol/L 4.3 3.7 4.0  Chloride 101 - 111 mmol/L 105 104 103  CO2 22 - 32 mmol/L Calcium 8.9 - 10.3 mg/dL 4.0(J) 8.1(X) 8.8  Total Protein 6.5 - 8.1 g/dL 7.0 - 7.1  Total Bilirubin 0.3 - 1.2 mg/dL 0.6 - 0.8  Alkaline Phos 38 - 126 U/L 89 - 67  AST 15 - 41 U/L 17 - 17  ALT 14 - 54 U/L 15 - 15      ASSESSMENT & PLAN:   Iron deficiency anemia 1.  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 - Status post Feraheme on 02/13/2018 and 02/20/2018 with improvement in symptoms (lightheadedness while walking and bending forward) - Hemoglobin improved to 12.1 from 10.2.  Ferritin improved from 17-86. -We will recheck her CBC, ferritin and iron panel in 3 months.  2. B12 deficiency: -B12 level was 200 on 02/07/2018.  She will be given B12 shot today.  I have asked her to take B12 1 tablet daily.  We  will recheck her levels in 3 months.  If there is no improvement, will put her on B12 injections.      Orders placed this encounter:  Orders Placed This Encounter  Procedures  . CBC  . Iron and TIBC  . Ferritin  . Vitamin B12  . Folate      Doreatha Massed, MD [facility] [phone]

## 2018-03-21 NOTE — Progress Notes (Signed)
Pt given B12 injection in right deltoid. Pt tolerated well with no complaints. Pt stable and discharged home ambulatory.

## 2018-06-15 ENCOUNTER — Encounter (HOSPITAL_COMMUNITY): Payer: Self-pay

## 2018-06-19 ENCOUNTER — Inpatient Hospital Stay (HOSPITAL_COMMUNITY): Payer: BC Managed Care – PPO | Attending: Hematology

## 2018-06-19 ENCOUNTER — Other Ambulatory Visit (HOSPITAL_COMMUNITY): Payer: BC Managed Care – PPO

## 2018-06-19 DIAGNOSIS — D509 Iron deficiency anemia, unspecified: Secondary | ICD-10-CM | POA: Diagnosis present

## 2018-06-19 DIAGNOSIS — Z808 Family history of malignant neoplasm of other organs or systems: Secondary | ICD-10-CM | POA: Diagnosis not present

## 2018-06-19 DIAGNOSIS — E538 Deficiency of other specified B group vitamins: Secondary | ICD-10-CM | POA: Diagnosis present

## 2018-06-19 DIAGNOSIS — R5383 Other fatigue: Secondary | ICD-10-CM | POA: Diagnosis not present

## 2018-06-19 DIAGNOSIS — Z801 Family history of malignant neoplasm of trachea, bronchus and lung: Secondary | ICD-10-CM | POA: Diagnosis not present

## 2018-06-19 DIAGNOSIS — Z79899 Other long term (current) drug therapy: Secondary | ICD-10-CM | POA: Diagnosis not present

## 2018-06-19 LAB — CBC
HCT: 37.8 % (ref 36.0–46.0)
Hemoglobin: 12.4 g/dL (ref 12.0–15.0)
MCH: 30.5 pg (ref 26.0–34.0)
MCHC: 32.8 g/dL (ref 30.0–36.0)
MCV: 93.1 fL (ref 78.0–100.0)
PLATELETS: 242 10*3/uL (ref 150–400)
RBC: 4.06 MIL/uL (ref 3.87–5.11)
RDW: 13.3 % (ref 11.5–15.5)
WBC: 8 10*3/uL (ref 4.0–10.5)

## 2018-06-19 LAB — VITAMIN B12: Vitamin B-12: 270 pg/mL (ref 180–914)

## 2018-06-19 LAB — FERRITIN: Ferritin: 48 ng/mL (ref 11–307)

## 2018-06-19 LAB — IRON AND TIBC
IRON: 82 ug/dL (ref 28–170)
Saturation Ratios: 26 % (ref 10.4–31.8)
TIBC: 321 ug/dL (ref 250–450)
UIBC: 239 ug/dL

## 2018-06-19 LAB — FOLATE: Folate: 16.9 ng/mL (ref >5.9)

## 2018-06-24 ENCOUNTER — Inpatient Hospital Stay (HOSPITAL_BASED_OUTPATIENT_CLINIC_OR_DEPARTMENT_OTHER): Payer: BC Managed Care – PPO | Admitting: Hematology

## 2018-06-24 ENCOUNTER — Other Ambulatory Visit: Payer: Self-pay

## 2018-06-24 ENCOUNTER — Encounter (HOSPITAL_COMMUNITY): Payer: Self-pay | Admitting: Hematology

## 2018-06-24 VITALS — BP 147/67 | HR 81 | Temp 97.6°F | Resp 16 | Wt 267.5 lb

## 2018-06-24 DIAGNOSIS — Z79899 Other long term (current) drug therapy: Secondary | ICD-10-CM

## 2018-06-24 DIAGNOSIS — D509 Iron deficiency anemia, unspecified: Secondary | ICD-10-CM | POA: Diagnosis not present

## 2018-06-24 DIAGNOSIS — E538 Deficiency of other specified B group vitamins: Secondary | ICD-10-CM

## 2018-06-24 DIAGNOSIS — R5383 Other fatigue: Secondary | ICD-10-CM

## 2018-06-24 DIAGNOSIS — Z801 Family history of malignant neoplasm of trachea, bronchus and lung: Secondary | ICD-10-CM

## 2018-06-24 DIAGNOSIS — Z808 Family history of malignant neoplasm of other organs or systems: Secondary | ICD-10-CM

## 2018-06-24 NOTE — Progress Notes (Signed)
[facility] [address], [place] [address]   CLINIC:  Medical Oncology/Hematology  PCP:  Dianne Dun, MD [name] [place] [address] [phone]   REASON FOR VISIT:  Follow-up for iron deficiency anemia and B12 deficiency   CURRENT THERAPY: Intermittent iron infusions    INTERVAL HISTORY:  [name] 42 y.o. female returns for routine follow-up for iron deficiency anemia and B12 deficiency. Patient is feeling fatigued throughout the day and her energy is low. She is not taking oral iron supplements. She also did not get the 1mg  B12 supplements either. She was using a liquid flavor for water that had vitamins and B12 in it but it is not suffincent to maintain her B12. Patient denies any bleeding or blood in her stool. Denies any new pains. Denies any headaches. Patient reports her appetite at 100% and her energy level at 75%.    REVIEW OF SYSTEMS:  Review of Systems  Constitutional: Positive for fatigue.  Neurological: Positive for numbness.  All other systems reviewed and are negative.    PAST MEDICAL/SURGICAL HISTORY:  Past Medical History:  Diagnosis Date  . Anemia   . Arthritis   . Asthma    EXERCISE INDUCED  . Bursitis of hip   . Depression   . Generalized headaches   . Irregular menses   . Obesity (BMI 30-39.9)    Past Surgical History:  Procedure Laterality Date  . ABDOMINAL HYSTERECTOMY    . ACHILLES TENDON LENGTHENING  1989  . ANKLE GANGLION CYST EXCISION  02/2012   left ankle  . FEET SURGERY  1990   INSERTION OF BONE GRAFT IN FEET  . LAPAROSCOPIC GASTRIC BANDING  10/17/10   Dr Gaynelle Adu; AP Standard  . LIPOMA EXCISION  02/2006   abdomen     SOCIAL HISTORY:  Social History   Socioeconomic History  . Marital status: Married    Spouse name: Not on file  . Number of children: Not on file  . Years of education: Not on file  . Highest education level: Not on file  Occupational History  . Not on file    Social Needs  . Financial resource strain: Not on file  . Food insecurity:    Worry: Not on file    Inability: Not on file  . Transportation needs:    Medical: Not on file    Non-medical: Not on file  Tobacco Use  . Smoking status: Never Smoker  . Smokeless tobacco: Never Used  Substance and Sexual Activity  . Alcohol use: No  . Drug use: No  . Sexual activity: Yes  Lifestyle  . Physical activity:    Days per week: Not on file    Minutes per session: Not on file  . Stress: Not on file  Relationships  . Social connections:    Talks on phone: Not on file    Gets together: Not on file    Attends religious service: Not on file    Active member of club or organization: Not on file    Attends meetings of clubs or organizations: Not on file    Relationship status: Not on file  . Intimate partner violence:    Fear of current or ex partner: Not on file    Emotionally abused: Not on file    Physically abused: Not on file    Forced sexual activity: Not on file  Other Topics Concern  . Not on file  Social  History Narrative  . Not on file    FAMILY HISTORY:  Family History  Problem Relation Age of Onset  . Heart disease Maternal Grandmother        V. Fib arrest  . Hypertension Mother   . Hyperlipidemia Mother   . Hypertension Father   . Hyperlipidemia Father   . Heart disease Father   . Diabetes Father   . Cancer Father        skin  . Diabetes Sister   . Hypertension Sister   . Cancer Maternal Grandfather        lung and skin  . Cancer Paternal Grandfather        stomach and skin    CURRENT MEDICATIONS:  Outpatient Encounter Medications as of 06/24/2018  Medication Sig Note  . albuterol (PROAIR HFA) 108 (90 BASE) MCG/ACT inhaler Inhale 2 puffs into the lungs every 6 (six) hours as needed for wheezing.   Marland Kitchen. buPROPion (WELLBUTRIN XL) 150 MG 24 hr tablet Take 300 mg by mouth daily.    . butalbital-acetaminophen-caffeine (FIORICET, ESGIC) 50-325-40 MG tablet Take 1  tablet by mouth 2 (two) times daily as needed for headache.   Marland Kitchen. EPINEPHrine (EPIPEN) 0.3 mg/0.3 mL DEVI Inject 0.3 mLs (0.3 mg total) into the muscle once. 02/03/2018: Never had to use    . Ferrous Sulfate (IRON) 142 (45 Fe) MG TBCR Take 2 tablets by mouth every other day.   . fexofenadine-pseudoephedrine (ALLEGRA-D 24) 180-240 MG per 24 hr tablet Take 1 tablet by mouth at bedtime as needed (allergies).    . fluticasone (FLONASE) 50 MCG/ACT nasal spray Place 1 spray into both nostrils daily as needed for allergies.    Marland Kitchen. gabapentin (NEURONTIN) 300 MG capsule Take 300 mg by mouth 2 (two) times daily.    . naproxen sodium (ALEVE) 220 MG tablet Take 220-440 mg by mouth daily as needed (pain).   . pantoprazole (PROTONIX) 40 MG tablet Take 40 mg by mouth daily.    Marland Kitchen. trolamine salicylate (ASPERCREME) 10 % cream Apply 1 application topically as needed for muscle pain.   . [DISCONTINUED] oxyCODONE (OXY IR/ROXICODONE) 5 MG immediate release tablet Take 1 tablet (5 mg total) by mouth every 6 (six) hours as needed for moderate pain. (Patient not taking: Reported on 03/21/2018)    No facility-administered encounter medications on file as of 06/24/2018.     ALLERGIES:  Allergies  Allergen Reactions  . Cashew Nut Oil Anaphylaxis  . Cauliflower [Brassica Oleracea Italica] Anaphylaxis  . Septra Ds [Sulfamethoxazole-Trimethoprim]     Hives, SOB, itching  . Whey Anaphylaxis  . Orange Fruit [Citrus] Other (See Comments)    Migraines   . Augmentin [Amoxicillin-Pot Clavulanate] Diarrhea and Nausea And Vomiting    Has patient had a PCN reaction causing immediate rash, facial/tongue/throat swelling, SOB or lightheadedness with hypotension: No Has patient had a PCN reaction causing severe rash involving mucus membranes or skin necrosis: No Has patient had a PCN reaction that required hospitalization: No Has patient had a PCN reaction occurring within the last 10 years: Unknown If all of the above answers are "NO",  then may proceed with Cephalosporin use.   . Codeine     rash  . Milk-Related Compounds   . Other     epidural steroid injection - severe headaches, high fever  . Tree Extract Itching    Tee tree oil     PHYSICAL EXAM:  ECOG Performance status: 1  Vitals:   06/24/18 1332  BP: Marland Kitchen(!)  147/67  Pulse: 81  Resp: 16  Temp: 97.6 F (36.4 C)  SpO2: 100%   Filed Weights   06/24/18 1332  Weight: 267 lb 8 oz (121.3 kg)    Physical Exam  Constitutional: She is oriented to person, place, and time. She appears well-developed and well-nourished.  Cardiovascular: Normal rate and regular rhythm.  Pulmonary/Chest: Effort normal and breath sounds normal.  Neurological: She is alert and oriented to person, place, and time.  Skin: Skin is warm and dry.     LABORATORY DATA:  I have reviewed the labs as listed.  CBC    Component Value Date/Time   WBC 8.0 06/19/2018 1142   RBC 4.06 06/19/2018 1142   HGB 12.4 06/19/2018 1142   HGB 13.7 10/13/2013 1524   HCT 37.8 06/19/2018 1142   HCT 40.9 10/13/2013 1524   PLT 242 06/19/2018 1142   PLT 270 10/13/2013 1524   MCV 93.1 06/19/2018 1142   MCV 88 10/13/2013 1524   MCH 30.5 06/19/2018 1142   MCHC 32.8 06/19/2018 1142   RDW 13.3 06/19/2018 1142   RDW 13.3 10/13/2013 1524   LYMPHSABS 2.2 10/13/2013 1524   MONOABS 0.4 10/13/2013 1524   EOSABS 0.3 10/13/2013 1524   BASOSABS 0.1 10/13/2013 1524   CMP Latest Ref Rng & Units 01/30/2018 01/09/2018 02/25/2013  Glucose 65 - 99 mg/dL 88 99 98  BUN 6 - 20 mg/dL 7 10 10   Creatinine 0.44 - 1.00 mg/dL 1.61 0.96 0.8  Sodium 045 - 145 mmol/L 139 137 136  Potassium 3.5 - 5.1 mmol/L 4.3 3.7 4.0  Chloride 101 - 111 mmol/L 105 104 103  CO2 22 - 32 mmol/L 28 27 29   Calcium 8.9 - 10.3 mg/dL 4.0(J) 8.1(X) 8.8  Total Protein 6.5 - 8.1 g/dL 7.0 - 7.1  Total Bilirubin 0.3 - 1.2 mg/dL 0.6 - 0.8  Alkaline Phos 38 - 126 U/L 89 - 67  AST 15 - 41 U/L 17 - 17  ALT 14 - 54 U/L 15 - 15      ASSESSMENT & PLAN:     Iron deficiency anemia 1.  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  She reportedly had gastric sleeve removed in April 2019. -On iron tablets started on January 09, 2018, currently taking 1 daily without any major side effects. - Status post Feraheme on 02/13/2018 and 02/20/2018 with improvement in symptoms (lightheadedness while walking and bending forward) - Denies any bleeding per rectum or melena.  Will check stool cards for occult blood x3.  If there is any positive test, will consider colonoscopy. - Her hemoglobin is normal.  Ferritin has decreased to 48 from 86.  She complains of fatigue. -I have recommended 2 more infusions of Feraheme 1 week apart.  We will see her back in 4 months for follow-up.  2. B12 deficiency: -B12 level was 200 on 02/07/2018.  She will be given B12 shot today.  I have asked her to take B12 1 tablet daily.  She was taking another form of B12 supplement.  Today B12 level was 270.  I have asked her to start taking the B12 1 mg tablet.      Orders placed this encounter:  Orders Placed This Encounter  Procedures  . Occult blood x 1 card to lab, stool  . Occult blood x 1 card to lab, stool  . Occult blood x 1 card to lab, stool  . CBC with Differential/Platelet  .  Comprehensive metabolic panel  . Ferritin  . Iron and TIBC  . Lactate dehydrogenase  . Folate  . Vitamin B12      Doreatha Massed, MD [facility] [phone]

## 2018-06-24 NOTE — Patient Instructions (Signed)
[facility] Discharge Instructions  Follow up in 4 months with labs few days prior.   Thank you for choosing [facility] at [facility] to provide your oncology and hematology care.  To afford each patient quality time with our provider, please arrive at least 15 minutes before your scheduled appointment time.   If you have a lab appointment with the Cancer Center please come in thru the  Main Entrance and check in at the main information desk  You need to re-schedule your appointment should you arrive 10 or more minutes late.  We strive to give you quality time with our providers, and arriving late affects you and other patients whose appointments are after yours.  Also, if you no show three or more times for appointments you may be dismissed from the clinic at the providers discretion.     Again, thank you for choosing [facility].  Our hope is that these requests will decrease the amount of time that you wait before being seen by our physicians.       _____________________________________________________________  Should you have questions after your visit to [facility], please contact our office at [phone] between the hours of 8:00 a.m. and 4:30 p.m.  Voicemails left after 4:00 p.m. will not be returned until the following business day.  For prescription refill requests, have your pharmacy contact our office and allow 72 hours.    Cancer Center Support Programs:   > Cancer Support Group  2nd Tuesday of the month 1pm-2pm, Journey Room

## 2018-06-24 NOTE — Assessment & Plan Note (Addendum)
1.  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  She reportedly had gastric sleeve removed in April 2019. -On iron tablets started on January 09, 2018, currently taking 1 daily without any major side effects. - Status post Feraheme on 02/13/2018 and 02/20/2018 with improvement in symptoms (lightheadedness while walking and bending forward) - Denies any bleeding per rectum or melena.  Will check stool cards for occult blood x3.  If there is any positive test, will consider colonoscopy. - Her hemoglobin is normal.  Ferritin has decreased to 48 from 86.  She complains of fatigue. -I have recommended 2 more infusions of Feraheme 1 week apart.  We will see her back in 4 months for follow-up.  2. B12 deficiency: -B12 level was 200 on 02/07/2018.  She will be given B12 shot today.  I have asked her to take B12 1 tablet daily.  She was taking another form of B12 supplement.  Today B12 level was 270.  I have asked her to start taking the B12 1 mg tablet.

## 2018-07-01 ENCOUNTER — Other Ambulatory Visit (HOSPITAL_COMMUNITY): Payer: Self-pay | Admitting: *Deleted

## 2018-07-01 DIAGNOSIS — D509 Iron deficiency anemia, unspecified: Secondary | ICD-10-CM

## 2018-07-01 LAB — OCCULT BLOOD X 1 CARD TO LAB, STOOL
Fecal Occult Bld: NEGATIVE — AB
Fecal Occult Bld: NEGATIVE — AB

## 2018-07-08 ENCOUNTER — Encounter (HOSPITAL_COMMUNITY): Payer: Self-pay

## 2018-07-08 ENCOUNTER — Inpatient Hospital Stay (HOSPITAL_COMMUNITY): Payer: BC Managed Care – PPO | Attending: Hematology

## 2018-07-08 VITALS — BP 126/71 | HR 88 | Temp 98.0°F | Resp 18

## 2018-07-08 DIAGNOSIS — D509 Iron deficiency anemia, unspecified: Secondary | ICD-10-CM | POA: Diagnosis present

## 2018-07-08 MED ORDER — SODIUM CHLORIDE 0.9 % IV SOLN
INTRAVENOUS | Status: DC
  Administered 2018-07-08: 15:00:00 via INTRAVENOUS

## 2018-07-08 MED ORDER — SODIUM CHLORIDE 0.9 % IV SOLN
510.0000 mg | Freq: Once | INTRAVENOUS | Status: AC
  Administered 2018-07-08: 510 mg via INTRAVENOUS
  Filled 2018-07-08: qty 17

## 2018-07-08 MED ORDER — SODIUM CHLORIDE 0.9% FLUSH
10.0000 mL | Freq: Once | INTRAVENOUS | Status: AC
  Administered 2018-07-08: 10 mL via INTRAVENOUS

## 2018-07-08 NOTE — Patient Instructions (Signed)
[facility] at [facility]  Discharge Instructions:  You received iron today.  _______________________________________________________________  Thank you for choosing [facility] at [facility] to provide your oncology and hematology care.  To afford each patient quality time with our providers, please arrive at least 15 minutes before your scheduled appointment.  You need to re-schedule your appointment if you arrive 10 or more minutes late.  We strive to give you quality time with our providers, and arriving late affects you and other patients whose appointments are after yours.  Also, if you no show three or more times for appointments you may be dismissed from the clinic.  Again, thank you for choosing [facility] at [facility]. Our hope is that these requests will allow you access to exceptional care and in a timely manner. _______________________________________________________________  If you have questions after your visit, please contact our office at [phone] between the hours of 8:30 a.m. and 5:00 p.m. Voicemails left after 4:30 p.m. will not be returned until the following business day. _______________________________________________________________  For prescription refill requests, have your pharmacy contact our office. _______________________________________________________________  Recommendations made by the consultant and any test results will be sent to your referring physician. _______________________________________________________________ 

## 2018-07-08 NOTE — Progress Notes (Signed)
Patient tolerated iron infusion with no complaints voiced.  Good blood return noted before and after administration of therapy.  Peripheral IV site clean and dry with no bruising or swelling noted at site.  Band aid applied.  VSS with discharge and left ambulatory with no s/s of distress noted.  

## 2018-07-15 ENCOUNTER — Ambulatory Visit (HOSPITAL_COMMUNITY): Payer: BC Managed Care – PPO

## 2018-07-22 ENCOUNTER — Other Ambulatory Visit: Payer: Self-pay

## 2018-07-22 ENCOUNTER — Inpatient Hospital Stay (HOSPITAL_COMMUNITY): Payer: BC Managed Care – PPO

## 2018-07-22 ENCOUNTER — Encounter (HOSPITAL_COMMUNITY): Payer: Self-pay

## 2018-07-22 VITALS — BP 129/61 | HR 75 | Temp 98.1°F | Resp 18

## 2018-07-22 DIAGNOSIS — D509 Iron deficiency anemia, unspecified: Secondary | ICD-10-CM | POA: Diagnosis not present

## 2018-07-22 MED ORDER — SODIUM CHLORIDE 0.9 % IV SOLN
INTRAVENOUS | Status: DC
  Administered 2018-07-22: 12:00:00 via INTRAVENOUS

## 2018-07-22 MED ORDER — SODIUM CHLORIDE 0.9 % IV SOLN
510.0000 mg | Freq: Once | INTRAVENOUS | Status: AC
  Administered 2018-07-22: 510 mg via INTRAVENOUS
  Filled 2018-07-22: qty 17

## 2018-07-22 NOTE — Progress Notes (Signed)
Iron given per orders. Patient tolerated it well without problems. Vitals stable and discharged home from clinic ambulatory. Follow up as scheduled.  

## 2018-07-22 NOTE — Patient Instructions (Signed)
[facility] at [facility] Discharge Instructions     Thank you for choosing [facility] at [facility] to provide your oncology and hematology care.  To afford each patient quality time with our provider, please arrive at least 15 minutes before your scheduled appointment time.   If you have a lab appointment with the Cancer Center please come in thru the  Main Entrance and check in at the main information desk  You need to re-schedule your appointment should you arrive 10 or more minutes late.  We strive to give you quality time with our providers, and arriving late affects you and other patients whose appointments are after yours.  Also, if you no show three or more times for appointments you may be dismissed from the clinic at the providers discretion.     Again, thank you for choosing [facility].  Our hope is that these requests will decrease the amount of time that you wait before being seen by our physicians.       _____________________________________________________________  Should you have questions after your visit to [facility], please contact our office at [phone] between the hours of 8:00 a.m. and 4:30 p.m.  Voicemails left after 4:00 p.m. will not be returned until the following business day.  For prescription refill requests, have your pharmacy contact our office and allow 72 hours.    Cancer Center Support Programs:   > Cancer Support Group  2nd Tuesday of the month 1pm-2pm, Journey Room    

## 2018-07-24 ENCOUNTER — Encounter: Payer: Self-pay | Admitting: Family Medicine

## 2018-07-28 ENCOUNTER — Ambulatory Visit (INDEPENDENT_AMBULATORY_CARE_PROVIDER_SITE_OTHER): Payer: BC Managed Care – PPO | Admitting: Family Medicine

## 2018-07-28 VITALS — BP 118/84 | HR 77 | Temp 97.8°F | Ht 65.0 in | Wt 263.6 lb

## 2018-07-28 DIAGNOSIS — Z91018 Allergy to other foods: Secondary | ICD-10-CM | POA: Insufficient documentation

## 2018-07-28 DIAGNOSIS — D509 Iron deficiency anemia, unspecified: Secondary | ICD-10-CM | POA: Diagnosis not present

## 2018-07-28 DIAGNOSIS — Z8379 Family history of other diseases of the digestive system: Secondary | ICD-10-CM | POA: Diagnosis not present

## 2018-07-28 DIAGNOSIS — R768 Other specified abnormal immunological findings in serum: Secondary | ICD-10-CM

## 2018-07-28 HISTORY — DX: Family history of other diseases of the digestive system: Z83.79

## 2018-07-28 NOTE — Assessment & Plan Note (Addendum)
>  25 minutes spent in face to face time with patient, >50% spent in counselling or coordination of care discussing family history, her anemia, how she is feeling.  Will order celiac and rheum labs.  If all negative, refer to GI for endoscopy/colonoscopy.  If rheumatology labs positive, refer instead to rheumatology. The patient indicates understanding of these issues and agrees with the plan.

## 2018-07-28 NOTE — Progress Notes (Signed)
Subjective:   Patient ID: [name], female    DOB: [birth year], 42 y.o.   MRN: [id number]  [name] is a pleasant 42 y.o. year old female who presents to clinic today with Discuss need for referral (She is going to Hematologist and has had 4 Fe infusions since May.  Takes an Fe supplement daily and the numbers were in half.  Is also Vit-B defficient and had a shot and is taking daily.  Is wondering if there is underlying Rheumatologic issue.)  on 07/28/2018  HPI:  Patient sent the following mychart message on 07/24/18-  My oldest son is being tested for Celiac disease and my daughter has a gluten intolerance. I have multiple food allergies and so much health stuff going on that I wondered if I can schedule an appointment with a rheumatologist to explore if I may have an autoimmune issue myself. A coworker who is a Designer, jewellery said that she herself discovered an issue after several family members started experiencing symptoms of autoimmune issues and she feels so much better once she knew what to avoid.   She does see hematology for iron deficiency anemia, receives iron infusions.  Has had four iron infusions since 02/2018.  Last iron infusion was on 07/22/18. Also receiving IM Vit B12, along with taking oral iron and iron Vit B12.  She questions if she has a rheumatological issue occurring.  Lab Results  Component Value Date   WBC 8.0 06/19/2018   HGB 12.4 06/19/2018   HCT 37.8 06/19/2018   MCV 93.1 06/19/2018   PLT 242 06/19/2018   Lab Results  Component Value Date   FERRITIN 48 06/19/2018     Current Outpatient Medications on File Prior to Visit  Medication Sig Dispense Refill  . albuterol (PROAIR HFA) 108 (90 BASE) MCG/ACT inhaler Inhale 2 puffs into the lungs every 6 (six) hours as needed for wheezing.    Marland Kitchen buPROPion (WELLBUTRIN XL) 150 MG 24 hr tablet Take 300 mg by mouth daily.     . butalbital-acetaminophen-caffeine (FIORICET, ESGIC) 50-325-40 MG tablet Take  1 tablet by mouth 2 (two) times daily as needed for headache.    Marland Kitchen EPINEPHrine (EPIPEN) 0.3 mg/0.3 mL DEVI Inject 0.3 mLs (0.3 mg total) into the muscle once. 1 Device 0  . Ferrous Sulfate (IRON) 142 (45 Fe) MG TBCR Take 2 tablets by mouth every other day. 30 tablet 1  . fexofenadine-pseudoephedrine (ALLEGRA-D 24) 180-240 MG per 24 hr tablet Take 1 tablet by mouth at bedtime as needed (allergies).     . fluticasone (FLONASE) 50 MCG/ACT nasal spray Place 1 spray into both nostrils daily as needed for allergies.     Marland Kitchen gabapentin (NEURONTIN) 300 MG capsule Take 300 mg by mouth 2 (two) times daily.     . naproxen sodium (ALEVE) 220 MG tablet Take 220-440 mg by mouth daily as needed (pain).    . pantoprazole (PROTONIX) 40 MG tablet Take 40 mg by mouth daily.     Marland Kitchen trolamine salicylate (ASPERCREME) 10 % cream Apply 1 application topically as needed for muscle pain.     No current facility-administered medications on file prior to visit.     Allergies  Allergen Reactions  . Cashew Nut Oil Anaphylaxis  . Cauliflower [Brassica Oleracea Italica] Anaphylaxis  . Septra Ds [Sulfamethoxazole-Trimethoprim]     Hives, SOB, itching  . Whey Anaphylaxis  . Orange Fruit [Citrus] Other (See Comments)    Migraines   . Augmentin [Amoxicillin-Pot  Clavulanate] Diarrhea and Nausea And Vomiting    Has patient had a PCN reaction causing immediate rash, facial/tongue/throat swelling, SOB or lightheadedness with hypotension: No Has patient had a PCN reaction causing severe rash involving mucus membranes or skin necrosis: No Has patient had a PCN reaction that required hospitalization: No Has patient had a PCN reaction occurring within the last 10 years: Unknown If all of the above answers are "NO", then may proceed with Cephalosporin use.   . Codeine     rash  . Milk-Related Compounds   . Other     epidural steroid injection - severe headaches, high fever  . Tree Extract Itching    Tee tree oil    Past  Medical History:  Diagnosis Date  . Anemia   . Arthritis   . Asthma    EXERCISE INDUCED  . Bursitis of hip   . Depression   . Generalized headaches   . Irregular menses   . Obesity (BMI 30-39.9)     Past Surgical History:  Procedure Laterality Date  . ABDOMINAL HYSTERECTOMY    . ACHILLES TENDON LENGTHENING  1989  . ANKLE GANGLION CYST EXCISION  02/2012   left ankle  . FEET SURGERY  1990   INSERTION OF BONE GRAFT IN FEET  . LAPAROSCOPIC GASTRIC BANDING  10/17/10   Dr Gaynelle Adu; AP Standard  . LIPOMA EXCISION  02/2006   abdomen    Family History  Problem Relation Age of Onset  . Heart disease Maternal Grandmother        V. Fib arrest  . Hypertension Mother   . Hyperlipidemia Mother   . Hypertension Father   . Hyperlipidemia Father   . Heart disease Father   . Diabetes Father   . Cancer Father        skin  . Diabetes Sister   . Hypertension Sister   . Cancer Maternal Grandfather        lung and skin  . Cancer Paternal Grandfather        stomach and skin    Social History   Socioeconomic History  . Marital status: Married    Spouse name: Not on file  . Number of children: Not on file  . Years of education: Not on file  . Highest education level: Not on file  Occupational History  . Not on file  Social Needs  . Financial resource strain: Not on file  . Food insecurity:    Worry: Not on file    Inability: Not on file  . Transportation needs:    Medical: Not on file    Non-medical: Not on file  Tobacco Use  . Smoking status: Never Smoker  . Smokeless tobacco: Never Used  Substance and Sexual Activity  . Alcohol use: No  . Drug use: No  . Sexual activity: Yes  Lifestyle  . Physical activity:    Days per week: Not on file    Minutes per session: Not on file  . Stress: Not on file  Relationships  . Social connections:    Talks on phone: Not on file    Gets together: Not on file    Attends religious service: Not on file    Active member of club  or organization: Not on file    Attends meetings of clubs or organizations: Not on file    Relationship status: Not on file  . Intimate partner violence:    Fear of current or ex partner: Not on  file    Emotionally abused: Not on file    Physically abused: Not on file    Forced sexual activity: Not on file  Other Topics Concern  . Not on file  Social History Narrative  . Not on file   The PMH, PSH, Social History, Family History, Medications, and allergies have been reviewed in [facility], and have been updated if relevant.   Review of Systems  Constitutional: Positive for fatigue.  Respiratory: Negative.   Gastrointestinal: Negative for abdominal distention, abdominal pain, anal bleeding, blood in stool, constipation, diarrhea, nausea, rectal pain and vomiting.  Endocrine: Negative.   Genitourinary: Negative.   Musculoskeletal: Positive for arthralgias.  Skin: Negative.   Allergic/Immunologic: Negative.   Neurological: Negative.   Hematological: Negative.   Psychiatric/Behavioral: Negative.   All other systems reviewed and are negative.      Objective:    BP 118/84 (BP Location: Left Arm, Patient Position: Sitting, Cuff Size: Large)   Pulse 77   Temp 97.8 F (36.6 C) (Oral)   Ht 5\' 5"  (1.651 m)   Wt 263 lb 9.6 oz (119.6 kg)   LMP 11/07/2012   SpO2 98%   BMI 43.87 kg/m    Physical Exam  Constitutional: She is oriented to person, place, and time. She appears well-developed and well-nourished. No distress.  HENT:  Head: Normocephalic and atraumatic.  Eyes: EOM are normal.  Neck: Normal range of motion.  Cardiovascular: Normal rate.  Pulmonary/Chest: Effort normal.  Musculoskeletal: Normal range of motion. She exhibits no edema.  Neurological: She is alert and oriented to person, place, and time. No cranial nerve deficit.  Skin: Skin is warm and dry. She is not diaphoretic.  Psychiatric: She has a normal mood and affect. Her behavior is normal. Judgment and thought  content normal.  Nursing note and vitals reviewed.         Assessment & Plan:   Family history of celiac disease - Plan: Celiac panel 10, Sedimentation Rate, C-reactive protein, Antinuclear Antib (ANA)  Multiple food allergies - Plan: Celiac panel 10, Sedimentation Rate, C-reactive protein, Antinuclear Antib (ANA)  Iron deficiency anemia, unspecified iron deficiency anemia type No follow-ups on file.

## 2018-07-28 NOTE — Patient Instructions (Signed)
Great to see you. I will call you with your lab results from today and you can view them online.   

## 2018-07-28 NOTE — Addendum Note (Signed)
Addended by: Dianne Dun on: 07/28/2018 03:45 PM   Modules accepted: Orders

## 2018-07-29 LAB — C-REACTIVE PROTEIN: CRP: 0.4 mg/dL — ABNORMAL LOW (ref 0.5–20.0)

## 2018-07-29 LAB — SEDIMENTATION RATE: SED RATE: 29 mm/h — AB (ref 0–20)

## 2018-07-30 ENCOUNTER — Other Ambulatory Visit: Payer: Self-pay | Admitting: Family Medicine

## 2018-07-30 DIAGNOSIS — R768 Other specified abnormal immunological findings in serum: Secondary | ICD-10-CM

## 2018-07-30 LAB — ANTI-NUCLEAR AB-TITER (ANA TITER): ANA Titer 1: 1:40 {titer} — ABNORMAL HIGH

## 2018-07-30 LAB — ANA: Anti Nuclear Antibody(ANA): POSITIVE — AB

## 2018-07-30 LAB — CELIAC DISEASE COMPREHENSIVE PANEL WITH REFLEXES
(TTG) AB, IGA: 1 U/mL
Immunoglobulin A: 238 mg/dL (ref 47–310)

## 2018-08-01 ENCOUNTER — Encounter: Payer: Self-pay | Admitting: Family Medicine

## 2018-08-12 ENCOUNTER — Telehealth: Payer: Self-pay | Admitting: Family Medicine

## 2018-08-12 NOTE — Telephone Encounter (Signed)
Copied from CRM [id number]. Topic: General - Other >> Aug 12, 2018  9:29 AM Jaquita Rector A wrote: Reason for CRM: Patient called was trying to see Dr Dayton Martes today but nothing available she did not want to see another provider. Also did not elaborate on what was wrong with her just say that she is having multiple issues. Stated that she need to see Dr today or can she get a call back after 11a.m. At Ph# [phone]

## 2018-08-13 NOTE — Telephone Encounter (Signed)
Spoke with patient and she would like to know if she is still able to get the flu shot with her recent Dx regarding labs? Please advise.

## 2018-08-14 NOTE — Telephone Encounter (Signed)
Yes she can get her flu shot and I recommend that she does.

## 2018-08-14 NOTE — Telephone Encounter (Signed)
Spoke with patient regarding PCP confirming that patient should receive the flu vaccine. Patient states that she is going to go to a [facility] since it is closer for her. Nothing further needed .

## 2018-08-18 NOTE — Progress Notes (Signed)
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   History of Present Illness: [name] is a 42 y.o. female seen in consultation per request of her PCP.  According to patient she has family history of celiac disease in her children.  She wanted to get tested for celiac disease for the same reason.  She also has a history of anemia.  She states she has had for iron infusions since May 2019.  Her stools were negative for blood.  She recently went to see her PCP and on questioning she described that she has been having joint pain.  She describes having pain in her bilateral hands for the last 2 years which is getting worse.  She also has discomfort in her elbows, hip joints and knee joints.  She denies any joint swelling.  She has been diagnosed with degenerative disc disease of lumbar spine for which she is going to pain management and had epidural injections.  She had recent labs done by her PCP which showed positive ANA.  She states her celiac disease test was negative.  She was referred to me for evaluation of positive ANA.   Activities of Daily Living:  Patient reports morning stiffness for 5 minute.   Patient Denies nocturnal pain.  Difficulty dressing/grooming: Denies Difficulty climbing stairs: Reports Difficulty getting out of chair: Reports Difficulty using hands for taps, buttons, cutlery, and/or writing: Denies  Review of Systems  Constitutional: Positive for fatigue. Negative for night sweats, weight gain and weight loss.  HENT: Negative for mouth sores, trouble swallowing, trouble swallowing, mouth dryness and nose dryness.   Eyes: Negative for pain, redness, visual disturbance and dryness.  Respiratory: Positive for shortness of breath.  Negative for cough and difficulty breathing.         asthma  Cardiovascular: Negative for chest pain, palpitations, hypertension, irregular heartbeat and swelling in legs/feet.  Gastrointestinal: Negative for blood in stool, constipation and diarrhea.  Endocrine: Negative for increased urination.  Genitourinary: Negative for vaginal dryness.  Musculoskeletal: Positive for arthralgias, joint pain and morning stiffness. Negative for joint swelling, myalgias, muscle weakness, muscle tenderness and myalgias.  Skin: Negative for color change, rash, hair loss, skin tightness, ulcers and sensitivity to sunlight.  Allergic/Immunologic: Negative for susceptible to infections.  Neurological: Negative for dizziness, memory loss, night sweats and weakness.  Hematological: Negative for swollen glands.  Psychiatric/Behavioral: Positive for depressed mood and sleep disturbance. The patient is nervous/anxious.     PMFS History:  Patient Active Problem List   Diagnosis Date Noted  . DDD (degenerative disc disease), lumbar 08/27/2018  . Family history of rheumatoid arthritis 08/27/2018  . Flat foot 08/27/2018  . Multiple food allergies 07/28/2018  . Family history of celiac disease 07/28/2018  . Iron deficiency anemia 02/07/2018  . History of removal of laparoscopic gastric banding device 02/03/2018  . Anemia 01/13/2018  . H/O laparoscopic adjustable gastric banding 07/29/2013  . Bilateral ovarian cysts 11/13/2012  . Depression 01/22/2011  . Decreased sex drive 40/98/1191  . Morbid obesity (HCC) 02/13/2010  . OTHER MALAISE AND FATIGUE 12/20/2008    Past Medical  History:  Diagnosis Date  . Anemia   . Arthritis   . Asthma    EXERCISE INDUCED  . Bursitis of hip   . Depression   . Generalized headaches   . Irregular menses   . Obesity (BMI 30-39.9)     Family History  Problem Relation Age of Onset  . Heart disease Maternal Grandmother        V. Fib arrest  . Hypertension Mother   .  Hyperlipidemia Mother   . Diabetes Mother        pre-diabetes  . Hypertension Father   . Hyperlipidemia Father   . Heart disease Father   . Diabetes Father   . Cancer Father        skin  . Diabetes Sister   . Hypertension Sister   . Cancer Maternal Grandfather        lung and skin  . Cancer Paternal Grandfather        stomach and skin  . Pleurisy Son   . Migraines Son    Past Surgical History:  Procedure Laterality Date  . ABDOMINAL HYSTERECTOMY    . ACHILLES TENDON LENGTHENING  1989  . ANKLE GANGLION CYST EXCISION  02/2012   left ankle  . FEET SURGERY  1990   INSERTION OF BONE GRAFT IN FEET  . LAPAROSCOPIC GASTRIC BANDING  10/17/10   Dr Gaynelle Adu; AP Standard  . LIPOMA EXCISION  02/2006   abdomen   Social History   Social History Narrative  . Not on file    Objective: Vital Signs: BP (!) 152/105 (BP Location: Right Arm, Patient Position: Sitting, Cuff Size: Large)   Pulse 67   Resp 16   Ht 5' 5.5" (1.664 m)   Wt 266 lb (120.7 kg)   LMP 11/07/2012   BMI 43.59 kg/m    Physical Exam  Constitutional: She is oriented to person, place, and time. She appears well-developed and well-nourished.  HENT:  Head: Normocephalic and atraumatic.  Eyes: Conjunctivae and EOM are normal.  Neck: Normal range of motion.  Cardiovascular: Normal rate, regular rhythm, normal heart sounds and intact distal pulses.  Pulmonary/Chest: Effort normal and breath sounds normal.  Abdominal: Soft. Bowel sounds are normal.  Lymphadenopathy:    She has no cervical adenopathy.  Neurological: She is alert and oriented to person, place, and time.  Skin: Skin is warm and dry. Capillary refill takes less than 2 seconds.  Psychiatric: She has a normal mood and affect. Her behavior is normal.  Nursing note and vitals reviewed.    Musculoskeletal Exam: C-spine good range of motion.  She has limited range of motion of lumbar spine which is painful.  Shoulder joints, elbow joints, wrist joints,  MCPs, PIPs DIPs were in good range of motion with no synovitis.  She has some tenderness across her PIPs.  Hip joints with good range of motion.  She had right trochanteric bursa tenderness.  Knee joints with crepitus but no discomfort.  She has bilateral foot reconstruction surgery and pes planus.  CDAI Exam: CDAI Score: Not documented Patient Global Assessment: Not documented; Provider Global Assessment: Not documented Swollen: Not documented; Tender: Not documented Joint Exam   Not documented   There is currently no information documented on the homunculus. Go to the Rheumatology activity and complete the homunculus joint exam.  Investigation: Findings:  07/28/18: ANA 1:40 Nuclear, homogeneous, 1:640 Nuclear speckled, tTG 1, IgA 238, Sed rate 29, CRP 0.4  Component     Latest  Ref Rng & Units 07/28/2018  ANA Titer 1     titer 1:40 (H)  ANA Pattern 1      Nuclear, Homogeneous (A)  ANA TITER     titer 1:640 (H)  ANA PATTERN      Nuclear, Speckled (A)  Interpretation        (tTG) Ab, IgA     U/mL 1  Immunoglobulin A     47 - 310 mg/dL 811  Sed Rate     0 - 20 mm/hr 29 (H)  CRP     0.5 - 20.0 mg/dL 0.4 (L)  Anti Nuclear Antibody(ANA)     NEGATIVE POSITIVE (A)   Imaging: Xr Hand 2 View Left  Result Date: 08/27/2018 PIP narrowing was noted.  No MCP, intercarpal radiocarpal joint space narrowing was noted.  No erosive changes were noted. Impression: These findings are consistent with osteoarthritis of the hand.  Xr Hand 2 View Right  Result Date: 08/27/2018 PIP narrowing was noted.  No MCP, intercarpal radiocarpal joint space narrowing was noted.  No erosive changes were noted. Impression: These findings are consistent with osteoarthritis of the hand.  Xr Knee 3 View Left  Result Date: 08/27/2018 Moderate medial compartment narrowing was noted.  Moderate patellofemoral narrowing was noted.  No chondrocalcinosis was noted. Impression: These findings are consistent with  moderate osteoarthritis and moderate chondromalacia patella.  Xr Knee 3 View Right  Result Date: 08/27/2018 Moderate medial compartment narrowing was noted.  Moderate patellofemoral narrowing was noted.  No chondrocalcinosis was noted. Impression: These findings are consistent with moderate osteoarthritis and moderate chondromalacia patella.   Recent Labs: Lab Results  Component Value Date   WBC 8.0 06/19/2018   HGB 12.4 06/19/2018   PLT 242 06/19/2018   NA 139 01/30/2018   K 4.3 01/30/2018   CL 105 01/30/2018   CO2 28 01/30/2018   GLUCOSE 88 01/30/2018   BUN 7 01/30/2018   CREATININE 0.90 01/30/2018   BILITOT 0.6 01/30/2018   ALKPHOS 89 01/30/2018   AST 17 01/30/2018   ALT 15 01/30/2018   PROT 7.0 01/30/2018   ALBUMIN 3.8 01/30/2018   CALCIUM 8.8 (L) 01/30/2018   GFRAA >60 01/30/2018    Speciality Comments: No specialty comments available.  Procedures:  No procedures performed Allergies: Cashew nut oil; Cauliflower [brassica oleracea italica]; Whey; Orange fruit [citrus]; Augmentin [amoxicillin-pot clavulanate]; Codeine; Milk-related compounds; Other; and Tree extract   Assessment / Plan:     Visit Diagnoses: Positive ANA (antinuclear antibody) - 07/28/18: ANA 1:40 Nuclear, homogeneous, 1:640 Nuclear speckled, tTG 1, IgA 238, Sed rate 29, CRP 0.4 -patient complains of arthralgias and has positive ANA.  She has no other clinical features of autoimmune disease.  I will obtain following labs andAVISE abs today.  Plan: Urinalysis, Routine w reflex microscopic, Lupus Anticoagulant Eval w/Reflex, Glucose 6 phosphate dehydrogenase  Pain in both hands -she had no synovitis on examination.  She has some PIP tenderness.  Plan: XR Hand 2 View Right, XR Hand 2 View Left.  The x-ray revealed mild osteoarthritic changes only.  Chronic pain of both knees -she complains of knee joint pain.  Plan: XR KNEE 3 VIEW RIGHT, XR KNEE 3 VIEW LEFT.  The x-rays were consistent with moderate  osteoarthritis and moderate chondromalacia patella.  DDD (degenerative disc disease), lumbar - s/p injection by Dr. Ollen Bowl  Pes planus of both feet-status post bilateral foot reconstruction surgery and Achilles tendon lengthening in childhood.  She is currently not having discomfort in  her feet.  Bilateral ovarian cysts  History of asthma  History of anemia-patient is followed up by hematology and gets iron infusions.  History of removal of laparoscopic gastric banding device  History of depression  Family history of rheumatoid arthritis - Maternal uncle  Other fatigue - Plan: COMPLETE METABOLIC PANEL WITH GFR, CK, TSH, Serum protein electrophoresis with reflex   Orders: Orders Placed This Encounter  Procedures  . XR Hand 2 View Right  . XR Hand 2 View Left  . XR KNEE 3 VIEW RIGHT  . XR KNEE 3 VIEW LEFT  . COMPLETE METABOLIC PANEL WITH GFR  . Urinalysis, Routine w reflex microscopic  . CK  . TSH  . Lupus Anticoagulant Eval w/Reflex  . Glucose 6 phosphate dehydrogenase  . Serum protein electrophoresis with reflex   No orders of the defined types were placed in this encounter.   Face-to-face time spent with patient was 50 minutes. Greater than 50% of time was spent in counseling and coordination of care.  Follow-Up Instructions: Return for Positive ANA, arthralgia.   Pollyann Savoy, MD  Note - This record has been created using Animal nutritionist.  Chart creation errors have been sought, but may not always  have been located. Such creation errors do not reflect on  the standard of medical care.

## 2018-08-27 ENCOUNTER — Ambulatory Visit (INDEPENDENT_AMBULATORY_CARE_PROVIDER_SITE_OTHER): Payer: Self-pay

## 2018-08-27 ENCOUNTER — Ambulatory Visit (INDEPENDENT_AMBULATORY_CARE_PROVIDER_SITE_OTHER): Payer: BC Managed Care – PPO | Admitting: Rheumatology

## 2018-08-27 ENCOUNTER — Encounter: Payer: Self-pay | Admitting: Rheumatology

## 2018-08-27 VITALS — BP 152/105 | HR 67 | Resp 16 | Ht 65.5 in | Wt 266.0 lb

## 2018-08-27 DIAGNOSIS — M2142 Flat foot [pes planus] (acquired), left foot: Secondary | ICD-10-CM

## 2018-08-27 DIAGNOSIS — N83202 Unspecified ovarian cyst, left side: Secondary | ICD-10-CM

## 2018-08-27 DIAGNOSIS — Z9884 Bariatric surgery status: Secondary | ICD-10-CM

## 2018-08-27 DIAGNOSIS — Z8709 Personal history of other diseases of the respiratory system: Secondary | ICD-10-CM

## 2018-08-27 DIAGNOSIS — R768 Other specified abnormal immunological findings in serum: Secondary | ICD-10-CM

## 2018-08-27 DIAGNOSIS — Z8261 Family history of arthritis: Secondary | ICD-10-CM

## 2018-08-27 DIAGNOSIS — G8929 Other chronic pain: Secondary | ICD-10-CM

## 2018-08-27 DIAGNOSIS — M2141 Flat foot [pes planus] (acquired), right foot: Secondary | ICD-10-CM

## 2018-08-27 DIAGNOSIS — M79642 Pain in left hand: Secondary | ICD-10-CM | POA: Diagnosis not present

## 2018-08-27 DIAGNOSIS — M25561 Pain in right knee: Secondary | ICD-10-CM

## 2018-08-27 DIAGNOSIS — M2140 Flat foot [pes planus] (acquired), unspecified foot: Secondary | ICD-10-CM

## 2018-08-27 DIAGNOSIS — M79641 Pain in right hand: Secondary | ICD-10-CM | POA: Diagnosis not present

## 2018-08-27 DIAGNOSIS — M51369 Other intervertebral disc degeneration, lumbar region without mention of lumbar back pain or lower extremity pain: Secondary | ICD-10-CM

## 2018-08-27 DIAGNOSIS — M5136 Other intervertebral disc degeneration, lumbar region: Secondary | ICD-10-CM | POA: Diagnosis not present

## 2018-08-27 DIAGNOSIS — N83201 Unspecified ovarian cyst, right side: Secondary | ICD-10-CM

## 2018-08-27 DIAGNOSIS — M25562 Pain in left knee: Secondary | ICD-10-CM

## 2018-08-27 DIAGNOSIS — R5383 Other fatigue: Secondary | ICD-10-CM

## 2018-08-27 DIAGNOSIS — Z862 Personal history of diseases of the blood and blood-forming organs and certain disorders involving the immune mechanism: Secondary | ICD-10-CM

## 2018-08-27 DIAGNOSIS — R7689 Other specified abnormal immunological findings in serum: Secondary | ICD-10-CM

## 2018-08-27 DIAGNOSIS — Z8659 Personal history of other mental and behavioral disorders: Secondary | ICD-10-CM

## 2018-08-27 HISTORY — DX: Family history of arthritis: Z82.61

## 2018-08-27 HISTORY — DX: Flat foot (pes planus) (acquired), unspecified foot: M21.40

## 2018-08-27 NOTE — Patient Instructions (Signed)

## 2018-09-03 LAB — URINALYSIS, ROUTINE W REFLEX MICROSCOPIC
BILIRUBIN URINE: NEGATIVE
Glucose, UA: NEGATIVE
Hgb urine dipstick: NEGATIVE
KETONES UR: NEGATIVE
LEUKOCYTES UA: NEGATIVE
Nitrite: NEGATIVE
Protein, ur: NEGATIVE
Specific Gravity, Urine: 1.007 (ref 1.001–1.03)
pH: 6.5 (ref 5.0–8.0)

## 2018-09-03 LAB — COMPLETE METABOLIC PANEL WITH GFR
AG RATIO: 1.5 (calc) (ref 1.0–2.5)
ALKALINE PHOSPHATASE (APISO): 95 U/L (ref 33–115)
ALT: 14 U/L (ref 6–29)
AST: 13 U/L (ref 10–30)
Albumin: 4.1 g/dL (ref 3.6–5.1)
BUN: 11 mg/dL (ref 7–25)
CALCIUM: 9.2 mg/dL (ref 8.6–10.2)
CO2: 30 mmol/L (ref 20–32)
CREATININE: 0.86 mg/dL (ref 0.50–1.10)
Chloride: 103 mmol/L (ref 98–110)
GFR, Est African American: 95 mL/min/{1.73_m2} (ref >=60)
GFR, Est Non African American: 82 mL/min/{1.73_m2} (ref >=60)
Globulin: 2.7 g/dL (calc) (ref 1.9–3.7)
Glucose, Bld: 76 mg/dL (ref 65–99)
Potassium: 4.1 mmol/L (ref 3.5–5.3)
Sodium: 139 mmol/L (ref 135–146)
Total Bilirubin: 0.5 mg/dL (ref 0.2–1.2)
Total Protein: 6.8 g/dL (ref 6.1–8.1)

## 2018-09-03 LAB — PROTEIN ELECTROPHORESIS, SERUM, WITH REFLEX
ALBUMIN ELP: 3.9 g/dL (ref 3.8–4.8)
Alpha 1: 0.3 g/dL (ref 0.2–0.3)
Alpha 2: 0.7 g/dL (ref 0.5–0.9)
BETA GLOBULIN: 0.4 g/dL (ref 0.4–0.6)
Beta 2: 0.4 g/dL (ref 0.2–0.5)
Gamma Globulin: 1.2 g/dL (ref 0.8–1.7)
TOTAL PROTEIN: 6.9 g/dL (ref 6.1–8.1)

## 2018-09-03 LAB — CK: Total CK: 80 U/L (ref 29–143)

## 2018-09-03 LAB — IFE INTERPRETATION: Immunofix Electr Int: NOT DETECTED

## 2018-09-03 LAB — LUPUS ANTICOAGULANT EVAL W/ REFLEX
DRVVT: 39 s (ref <=45)
PTT-LA SCREEN: 38 s (ref <=40)

## 2018-09-03 LAB — TSH: TSH: 2.06 mIU/L

## 2018-09-03 LAB — GLUCOSE 6 PHOSPHATE DEHYDROGENASE: G-6PDH: 14.7 U/g Hgb (ref 7.0–20.5)

## 2018-09-17 DIAGNOSIS — M19041 Primary osteoarthritis, right hand: Secondary | ICD-10-CM | POA: Insufficient documentation

## 2018-09-17 DIAGNOSIS — M170 Bilateral primary osteoarthritis of knee: Secondary | ICD-10-CM | POA: Insufficient documentation

## 2018-09-17 DIAGNOSIS — M19042 Primary osteoarthritis, left hand: Secondary | ICD-10-CM | POA: Insufficient documentation

## 2018-09-17 NOTE — Progress Notes (Signed)
Office Visit Note  Patient: [name]             Date of Birth: 03-11-1976           MRN: [id number]             PCP: Dianne Dun, MD Referring: Dianne Dun, MD Visit Date: 10/01/2018 Occupation: @GUAROCC @  Subjective:  Joint pain.   History of Present Illness: [name] is a 42 y.o. female with history of osteoarthritis and positive ANA.  She states she continues to have some stiffness in her hands and knee joints.  She has noticed intermittent swelling in her hands which she describes over her PIP joints.  She has been also feeling more anxious and having panic attacks.  She continues to have lower back pain.  She states that her lower back pain is severe and she will be getting injection towards the end of the month.  She is under a lot of stress due to her work.  Activities of Daily Living:  Patient reports morning stiffness for 30 minutes.   Patient Reports nocturnal pain.  Difficulty dressing/grooming: Denies Difficulty climbing stairs: Reports Difficulty getting out of chair: Reports Difficulty using hands for taps, buttons, cutlery, and/or writing: Denies  Review of Systems  Constitutional: Positive for fatigue. Negative for night sweats, weight gain and weight loss.  HENT: Negative for mouth sores, trouble swallowing, trouble swallowing, mouth dryness and nose dryness.   Eyes: Negative for pain, redness, visual disturbance and dryness.  Respiratory: Negative for cough, shortness of breath and difficulty breathing.   Cardiovascular: Negative for chest pain, palpitations, hypertension, irregular heartbeat and swelling in legs/feet.  Gastrointestinal: Negative for blood in stool, constipation and diarrhea.  Endocrine: Negative for increased urination.  Genitourinary: Negative for vaginal dryness.  Musculoskeletal: Positive for arthralgias and joint pain. Negative for joint swelling, myalgias, muscle weakness, morning stiffness, muscle tenderness and  myalgias.  Skin: Negative for color change, rash, hair loss, skin tightness, ulcers and sensitivity to sunlight.  Allergic/Immunologic: Negative for susceptible to infections.  Neurological: Negative for dizziness, memory loss, night sweats and weakness.  Hematological: Negative for swollen glands.  Psychiatric/Behavioral: Positive for sleep disturbance. Negative for depressed mood. The patient is nervous/anxious.     PMFS History:  Patient Active Problem List   Diagnosis Date Noted  . Primary osteoarthritis of both knees 09/17/2018  . Primary osteoarthritis of both hands 09/17/2018  . DDD (degenerative disc disease), lumbar 08/27/2018  . Family history of rheumatoid arthritis 08/27/2018  . Flat foot 08/27/2018  . Multiple food allergies 07/28/2018  . Family history of celiac disease 07/28/2018  . Iron deficiency anemia 02/07/2018  . History of removal of laparoscopic gastric banding device 02/03/2018  . Anemia 01/13/2018  . H/O laparoscopic adjustable gastric banding 07/29/2013  . Bilateral ovarian cysts 11/13/2012  . Depression 01/22/2011  . Decreased sex drive 82/95/6213  . Morbid obesity (HCC) 02/13/2010  . OTHER MALAISE AND FATIGUE 12/20/2008    Past Medical History:  Diagnosis Date  . Anemia   . Arthritis   . Asthma    EXERCISE INDUCED  . Bursitis of hip   . Depression   . Generalized headaches   . Irregular menses   . Obesity (BMI 30-39.9)     Family History  Problem Relation Age of Onset  . Heart disease Maternal Grandmother        V. Fib arrest  . Hypertension Mother   . Hyperlipidemia Mother   .  Diabetes Mother        pre-diabetes  . Hypertension Father   . Hyperlipidemia Father   . Heart disease Father   . Diabetes Father   . Cancer Father        skin  . Diabetes Sister   . Hypertension Sister   . Cancer Maternal Grandfather        lung and skin  . Cancer Paternal Grandfather        stomach and skin  . Pleurisy Son   . Migraines Son    Past  Surgical History:  Procedure Laterality Date  . ABDOMINAL HYSTERECTOMY    . ACHILLES TENDON LENGTHENING  1989  . ANKLE GANGLION CYST EXCISION  02/2012   left ankle  . FEET SURGERY  1990   INSERTION OF BONE GRAFT IN FEET  . LAPAROSCOPIC GASTRIC BANDING  10/17/10   Dr Gaynelle AduEric Wilson; [facility] Standard  . LIPOMA EXCISION  02/2006   abdomen   Social History   Social History Narrative  . Not on file    Objective: Vital Signs: BP (!) 157/90 (BP Location: Right Wrist, Patient Position: Sitting, Cuff Size: Normal)   Pulse 76   Resp 16   Ht 5' 4.5" (1.638 m)   Wt 271 lb 3.2 oz (123 kg)   LMP 11/07/2012   BMI 45.83 kg/m    Physical Exam  Constitutional: She is oriented to person, place, and time. She appears well-developed and well-nourished.  HENT:  Head: Normocephalic and atraumatic.  Eyes: Conjunctivae and EOM are normal.  Neck: Normal range of motion.  Cardiovascular: Normal rate, regular rhythm, normal heart sounds and intact distal pulses.  Pulmonary/Chest: Effort normal and breath sounds normal.  Abdominal: Soft. Bowel sounds are normal.  Lymphadenopathy:    She has no cervical adenopathy.  Neurological: She is alert and oriented to person, place, and time.  Skin: Skin is warm and dry. Capillary refill takes less than 2 seconds.  Psychiatric: She has a normal mood and affect. Her behavior is normal.  Nursing note and vitals reviewed.    Musculoskeletal Exam: C-spine thoracic spine good range of motion.  She has painful limited range of motion of her lumbar spine.  Shoulder joints elbow joints wrist joint MCPs PIPs DIPs been good range of motion.  She has PIP and DIP thickening in her hands and feet consistent with osteoarthritis.  She is crepitus in her knee joints without any warmth swelling or effusion.  CDAI Exam: CDAI Score: Not documented Patient Global Assessment: Not documented; Provider Global Assessment: Not documented Swollen: Not documented; Tender: Not  documented Joint Exam   Not documented   There is currently no information documented on the homunculus. Go to the Rheumatology activity and complete the homunculus joint exam.  Investigation: No additional findings.  Imaging: No results found.  Recent Labs: Lab Results  Component Value Date   WBC 8.0 06/19/2018   HGB 12.4 06/19/2018   PLT 242 06/19/2018   NA 139 08/27/2018   K 4.1 08/27/2018   CL 103 08/27/2018   CO2 30 08/27/2018   GLUCOSE 76 08/27/2018   BUN 11 08/27/2018   CREATININE 0.86 08/27/2018   BILITOT 0.5 08/27/2018   ALKPHOS 89 01/30/2018   AST 13 08/27/2018   ALT 14 08/27/2018   PROT 6.8 08/27/2018   PROT 6.9 08/27/2018   ALBUMIN 3.8 01/30/2018   CALCIUM 9.2 08/27/2018   GFRAA 95 08/27/2018  UA negative, IFE negative, G6PD 14.7 normal, TSH normal, CK 80,  lupus anticoagulant negative August 27, 2018 AVISE index -0.7, ANA 1: 320 speckled (dsDNA, Smith, RNP, SSA, SSB, SCL 70, anticentromere, Jo 1, CB, anticardiolipin, beta-2 GP 1-), RF negative, anti-CCP negative, anti- thyroglobulin negative antithyroid peroxidase negative  Speciality Comments: No specialty comments available.  Procedures:  No procedures performed Allergies: Cashew nut oil; Cauliflower [brassica oleracea italica]; Whey; Orange fruit [citrus]; Augmentin [amoxicillin-pot clavulanate]; Codeine; Milk-related compounds; Other; and Tree extract   Assessment / Plan:     Visit Diagnoses: Positive ANA (antinuclear antibody) - ANA 1: 320 speckled, ENA negative.  Patient has no clinical features of autoimmune disease.  I detailed discussion with patient regarding her labs.  Primary osteoarthritis of both hands - mild.  Joint protection muscle strengthening was discussed.  Primary osteoarthritis of both knees - Moderate osteoarthritis and moderate chondromalacia patella.  Weight loss diet and exercise was discussed.  I will send in a prescription for Voltaren gel that can be used topically.  Side  effects were discussed.  Pes planus of both feet - Status post bilateral foot reconstruction and Achilles tendon lengthening.  She is currently not having much discomfort.  DDD (degenerative disc disease), lumbar - Status post injection by Dr. Ollen Bowl.  She continues to have a lot of lower back pain.  She has a cortisone injection coming up.  Family history of celiac disease  Family history of rheumatoid arthritis  Anxiety -patient experiencing increased anxiety and panic attacks.  I have advised her to follow-up with her PCP.  She may need counseling.  Bilateral ovarian cysts  History of asthma  History of anemia-patient is followed up by hematology and gets iron infusions.  History of removal of laparoscopic gastric banding device  History of depression Orders: No orders of the defined types were placed in this encounter.  Meds ordered this encounter  Medications  . diclofenac sodium (VOLTAREN) 1 % GEL    Sig: 3 grams to 3 large joints up to 3 times daily    Dispense:  3 Tube    Refill:  3    Face-to-face time spent with patient was 35 minutes. Greater than 50% of time was spent in counseling and coordination of care.  Follow-Up Instructions: Return in about 6 months (around 04/02/2019) for Osteoarthritis, +ANA.   Pollyann Savoy, MD  Note - This record has been created using Animal nutritionist.  Chart creation errors have been sought, but may not always  have been located. Such creation errors do not reflect on  the standard of medical care.

## 2018-10-01 ENCOUNTER — Encounter: Payer: Self-pay | Admitting: Rheumatology

## 2018-10-01 ENCOUNTER — Encounter: Payer: Self-pay | Admitting: Family Medicine

## 2018-10-01 ENCOUNTER — Ambulatory Visit (INDEPENDENT_AMBULATORY_CARE_PROVIDER_SITE_OTHER): Payer: BC Managed Care – PPO | Admitting: Rheumatology

## 2018-10-01 VITALS — BP 157/90 | HR 76 | Resp 16 | Ht 64.5 in | Wt 271.2 lb

## 2018-10-01 DIAGNOSIS — Z8379 Family history of other diseases of the digestive system: Secondary | ICD-10-CM

## 2018-10-01 DIAGNOSIS — M5136 Other intervertebral disc degeneration, lumbar region: Secondary | ICD-10-CM

## 2018-10-01 DIAGNOSIS — M19042 Primary osteoarthritis, left hand: Secondary | ICD-10-CM

## 2018-10-01 DIAGNOSIS — M2141 Flat foot [pes planus] (acquired), right foot: Secondary | ICD-10-CM

## 2018-10-01 DIAGNOSIS — M19041 Primary osteoarthritis, right hand: Secondary | ICD-10-CM

## 2018-10-01 DIAGNOSIS — R768 Other specified abnormal immunological findings in serum: Secondary | ICD-10-CM | POA: Diagnosis not present

## 2018-10-01 DIAGNOSIS — M170 Bilateral primary osteoarthritis of knee: Secondary | ICD-10-CM

## 2018-10-01 DIAGNOSIS — M2142 Flat foot [pes planus] (acquired), left foot: Secondary | ICD-10-CM

## 2018-10-01 DIAGNOSIS — F419 Anxiety disorder, unspecified: Secondary | ICD-10-CM

## 2018-10-01 DIAGNOSIS — Z8261 Family history of arthritis: Secondary | ICD-10-CM

## 2018-10-01 MED ORDER — DICLOFENAC SODIUM 1 % TD GEL: TRANSDERMAL | 3 refills | Status: DC

## 2018-10-15 ENCOUNTER — Telehealth: Payer: Self-pay | Admitting: *Deleted

## 2018-10-15 NOTE — Telephone Encounter (Signed)
Prior Authorization submitted via cover my meds for Voltaren Gel. Will update once response is received.  

## 2018-10-20 ENCOUNTER — Inpatient Hospital Stay (HOSPITAL_COMMUNITY): Payer: BC Managed Care – PPO | Attending: Hematology

## 2018-10-20 DIAGNOSIS — Z79899 Other long term (current) drug therapy: Secondary | ICD-10-CM | POA: Diagnosis not present

## 2018-10-20 DIAGNOSIS — E538 Deficiency of other specified B group vitamins: Secondary | ICD-10-CM | POA: Diagnosis not present

## 2018-10-20 DIAGNOSIS — M1990 Unspecified osteoarthritis, unspecified site: Secondary | ICD-10-CM | POA: Diagnosis not present

## 2018-10-20 DIAGNOSIS — D509 Iron deficiency anemia, unspecified: Secondary | ICD-10-CM | POA: Insufficient documentation

## 2018-10-20 DIAGNOSIS — Z9884 Bariatric surgery status: Secondary | ICD-10-CM | POA: Diagnosis not present

## 2018-10-20 LAB — CBC WITH DIFFERENTIAL/PLATELET
Abs Immature Granulocytes: 0.04 10*3/uL (ref 0.00–0.07)
BASOS PCT: 1 %
Basophils Absolute: 0.1 10*3/uL (ref 0.0–0.1)
EOS PCT: 5 %
Eosinophils Absolute: 0.3 10*3/uL (ref 0.0–0.5)
HCT: 42.2 % (ref 36.0–46.0)
Hemoglobin: 13.5 g/dL (ref 12.0–15.0)
Immature Granulocytes: 1 %
Lymphocytes Relative: 25 %
Lymphs Abs: 1.6 10*3/uL (ref 0.7–4.0)
MCH: 30.8 pg (ref 26.0–34.0)
MCHC: 32 g/dL (ref 30.0–36.0)
MCV: 96.3 fL (ref 80.0–100.0)
MONO ABS: 0.5 10*3/uL (ref 0.1–1.0)
MONOS PCT: 8 %
Neutro Abs: 4 10*3/uL (ref 1.7–7.7)
Neutrophils Relative %: 60 %
PLATELETS: 267 10*3/uL (ref 150–400)
RBC: 4.38 MIL/uL (ref 3.87–5.11)
RDW: 12.4 % (ref 11.5–15.5)
WBC: 6.6 10*3/uL (ref 4.0–10.5)
nRBC: 0 % (ref 0.0–0.2)

## 2018-10-20 LAB — COMPREHENSIVE METABOLIC PANEL
ALT: 21 U/L (ref 0–44)
AST: 17 U/L (ref 15–41)
Albumin: 3.7 g/dL (ref 3.5–5.0)
Alkaline Phosphatase: 66 U/L (ref 38–126)
Anion gap: 6 (ref 5–15)
BUN: 13 mg/dL (ref 6–20)
CO2: 27 mmol/L (ref 22–32)
Calcium: 8.5 mg/dL — ABNORMAL LOW (ref 8.9–10.3)
Chloride: 106 mmol/L (ref 98–111)
Creatinine, Ser: 0.87 mg/dL (ref 0.44–1.00)
GFR calc Af Amer: >60 mL/min (ref >60)
GFR calc non Af Amer: >60 mL/min (ref >60)
GLUCOSE: 97 mg/dL (ref 70–99)
Potassium: 3.8 mmol/L (ref 3.5–5.1)
Sodium: 139 mmol/L (ref 135–145)
Total Bilirubin: 0.5 mg/dL (ref 0.3–1.2)
Total Protein: 6.8 g/dL (ref 6.5–8.1)

## 2018-10-20 LAB — FOLATE: Folate: 13.9 ng/mL (ref >5.9)

## 2018-10-20 LAB — IRON AND TIBC
Iron: 79 ug/dL (ref 28–170)
Saturation Ratios: 26 % (ref 10.4–31.8)
TIBC: 306 ug/dL (ref 250–450)
UIBC: 227 ug/dL

## 2018-10-20 LAB — FERRITIN: Ferritin: 107 ng/mL (ref 11–307)

## 2018-10-20 LAB — LACTATE DEHYDROGENASE: LDH: 130 U/L (ref 98–192)

## 2018-10-24 ENCOUNTER — Encounter (HOSPITAL_COMMUNITY): Payer: Self-pay | Admitting: Internal Medicine

## 2018-10-24 ENCOUNTER — Inpatient Hospital Stay (HOSPITAL_BASED_OUTPATIENT_CLINIC_OR_DEPARTMENT_OTHER): Payer: BC Managed Care – PPO | Admitting: Internal Medicine

## 2018-10-24 VITALS — BP 139/80 | HR 74 | Temp 98.3°F | Resp 16 | Wt 278.5 lb

## 2018-10-24 DIAGNOSIS — Z9884 Bariatric surgery status: Secondary | ICD-10-CM

## 2018-10-24 DIAGNOSIS — D509 Iron deficiency anemia, unspecified: Secondary | ICD-10-CM | POA: Diagnosis not present

## 2018-10-24 DIAGNOSIS — E538 Deficiency of other specified B group vitamins: Secondary | ICD-10-CM

## 2018-10-24 DIAGNOSIS — M1990 Unspecified osteoarthritis, unspecified site: Secondary | ICD-10-CM

## 2018-10-24 DIAGNOSIS — Z79899 Other long term (current) drug therapy: Secondary | ICD-10-CM

## 2018-10-24 NOTE — Progress Notes (Signed)
Diagnosis B12 deficiency  Iron deficiency anemia, unspecified iron deficiency anemia type - Plan: CBC with Differential/Platelet, Comprehensive metabolic panel, Lactate dehydrogenase, Ferritin  Staging Cancer Staging No matching staging information was found for the patient.  Assessment and Plan:  Iron deficiency anemia 1.  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  She reportedly had gastric sleeve removed in April 2019. -On iron tablets started on January 09, 2018, currently taking 1 daily without any major side effects.  Pt was last treated with IV iron 06/2018.    Labs done 10/20/2018 reviewed and showed WBC 6.6 Hb 13.5 plts 267,000.  Chemistries WNL with K+ 3.8 Cr 0.87 and normal LFTs.  Ferritin WNL at 107.    Pt is referred to GI for evaluation due to IDA and prior gastric band surgery, that has now been reversed.  She will RTC in 01/2019 for follow-up with Dr. Lorelle FormosaKatraggada.    2. B12 deficiency:  Pt on oral B12.    3.  Osteoarthritis.  Pt should follow-up with PCP or rheumatology as directed.    Current Status:  Pt is seen today for follow-up to go over labs.    Problem List Patient Active Problem List   Diagnosis Date Noted  . Primary osteoarthritis of both knees [M17.0] 09/17/2018  . Primary osteoarthritis of both hands [M19.041, M19.042] 09/17/2018  . DDD (degenerative disc disease), lumbar [M51.36] 08/27/2018  . Family history of rheumatoid arthritis [Z82.61] 08/27/2018  . Flat foot [M21.40] 08/27/2018  . Multiple food allergies [Z91.018] 07/28/2018  . Family history of celiac disease [Z83.79] 07/28/2018  . Iron deficiency anemia [D50.9] 02/07/2018  . History of removal of laparoscopic gastric banding device [Z98.84] 02/03/2018  . Anemia [D64.9] 01/13/2018  . H/O laparoscopic adjustable gastric banding [Z98.84] 07/29/2013  . Bilateral ovarian cysts [N83.201, N83.202] 11/13/2012  . Depression [F32.9]  01/22/2011  . Decreased sex drive [Z61.09][R68.82] 60/45/409803/26/2012  . Morbid obesity (HCC) [E66.01] 02/13/2010  . OTHER MALAISE AND FATIGUE [R53.81, R53.83] 12/20/2008    Past Medical History Past Medical History:  Diagnosis Date  . Anemia   . Arthritis   . Asthma    EXERCISE INDUCED  . Bursitis of hip   . Depression   . Generalized headaches   . Irregular menses   . Obesity (BMI 30-39.9)     Past Surgical History Past Surgical History:  Procedure Laterality Date  . ABDOMINAL HYSTERECTOMY    . ACHILLES TENDON LENGTHENING  1989  . ANKLE GANGLION CYST EXCISION  02/2012   left ankle  . FEET SURGERY  1990   INSERTION OF BONE GRAFT IN FEET  . LAPAROSCOPIC GASTRIC BANDING  10/17/10   Dr Gaynelle AduEric Wilson; [facility] Standard  . LIPOMA EXCISION  02/2006   abdomen    Family History Family History  Problem Relation Age of Onset  . Heart disease Maternal Grandmother        V. Fib arrest  . Hypertension Mother   . Hyperlipidemia Mother   . Diabetes Mother        pre-diabetes  . Hypertension Father   . Hyperlipidemia Father   . Heart disease Father   . Diabetes Father   . Cancer Father        skin  . Diabetes Sister   . Hypertension Sister   . Cancer Maternal Grandfather        lung and skin  . Cancer Paternal Grandfather        stomach  and skin  . Pleurisy Son   . Migraines Son      Social History  reports that she has never smoked. She has never used smokeless tobacco. She reports that she does not drink alcohol or use drugs.  Medications  Current Outpatient Medications:  .  albuterol (PROAIR HFA) 108 (90 BASE) MCG/ACT inhaler, Inhale 2 puffs into the lungs every 6 (six) hours as needed for wheezing., Disp: , Rfl:  .  buPROPion (WELLBUTRIN XL) 150 MG 24 hr tablet, Take 300 mg by mouth daily. , Disp: , Rfl:  .  busPIRone (BUSPAR) 5 MG tablet, Take 5 mg by mouth 2 (two) times daily., Disp: , Rfl:  .  butalbital-acetaminophen-caffeine (FIORICET, ESGIC) 50-325-40 MG tablet, Take 1 tablet by  mouth 2 (two) times daily as needed for headache., Disp: , Rfl:  .  diclofenac sodium (VOLTAREN) 1 % GEL, 3 grams to 3 large joints up to 3 times daily, Disp: 3 Tube, Rfl: 3 .  EPINEPHrine (EPIPEN) 0.3 mg/0.3 mL DEVI, Inject 0.3 mLs (0.3 mg total) into the muscle once., Disp: 1 Device, Rfl: 0 .  Ferrous Sulfate (IRON) 142 (45 Fe) MG TBCR, Take 2 tablets by mouth every other day. (Patient taking differently: Take 1 tablet by mouth daily. ), Disp: 30 tablet, Rfl: 1 .  fexofenadine-pseudoephedrine (ALLEGRA-D 24) 180-240 MG per 24 hr tablet, Take 1 tablet by mouth at bedtime as needed (allergies). , Disp: , Rfl:  .  fluticasone (FLONASE) 50 MCG/ACT nasal spray, Place 1 spray into both nostrils daily as needed for allergies. , Disp: , Rfl:  .  gabapentin (NEURONTIN) 300 MG capsule, Take 300 mg by mouth 2 (two) times daily. , Disp: , Rfl:  .  Multiple Vitamins-Minerals (MULTIVITAMIN PO), Take by mouth daily., Disp: , Rfl:  .  naproxen sodium (ALEVE) 220 MG tablet, Take 220-440 mg by mouth daily as needed (pain)., Disp: , Rfl:  .  trolamine salicylate (ASPERCREME) 10 % cream, Apply 1 application topically as needed for muscle pain., Disp: , Rfl:   Allergies Cashew nut oil; Cauliflower [brassica oleracea italica]; Whey; Orange fruit [citrus]; Augmentin [amoxicillin-pot clavulanate]; Codeine; Milk-related compounds; Other; and Tree extract  Review of Systems Review of Systems - Oncology ROS negative   Physical Exam  Vitals Wt Readings from Last 3 Encounters:  10/24/18 278 lb 8 oz (126.3 kg)  10/01/18 271 lb 3.2 oz (123 kg)  08/27/18 266 lb (120.7 kg)   Temp Readings from Last 3 Encounters:  10/24/18 98.3 F (36.8 C) (Oral)  07/28/18 97.8 F (36.6 C) (Oral)  07/22/18 98.1 F (36.7 C) (Oral)   BP Readings from Last 3 Encounters:  10/24/18 139/80  10/01/18 (!) 157/90  08/27/18 (!) 152/105   Pulse Readings from Last 3 Encounters:  10/24/18 74  10/01/18 76  08/27/18 67    Constitutional: Well-developed, well-nourished, and in no distress.   HENT: Head: Normocephalic and atraumatic.  Mouth/Throat: No oropharyngeal exudate. Mucosa moist. Eyes: Pupils are equal, round, and reactive to light. Conjunctivae are normal. No scleral icterus.  Neck: Normal range of motion. Neck supple. No JVD present.  Cardiovascular: Normal rate, regular rhythm and normal heart sounds.  Exam reveals no gallop and no friction rub.   No murmur heard. Pulmonary/Chest: Effort normal and breath sounds normal. No respiratory distress. No wheezes.No rales.  Abdominal: Soft. Bowel sounds are normal. No distension. There is no tenderness. There is no guarding. Scars on abdomen from prior surgeries.   Musculoskeletal: No edema or  tenderness.  Lymphadenopathy: No cervical, axillary or supraclavicular adenopathy.  Neurological: Alert and oriented to person, place, and time. No cranial nerve deficit.  Skin: Skin is warm and dry. No rash noted. No erythema. No pallor.  Psychiatric: Affect and judgment normal.   Labs No visits with results within 3 Day(s) from this visit.  Latest known visit with results is:  Appointment on 10/20/2018  Component Date Value Ref Range Status  . WBC 10/20/2018 6.6  4.0 - 10.5 K/uL Final  . RBC 10/20/2018 4.38  3.87 - 5.11 MIL/uL Final  . Hemoglobin 10/20/2018 13.5  12.0 - 15.0 g/dL Final  . HCT 96/29/5284 42.2  36.0 - 46.0 % Final  . MCV 10/20/2018 96.3  80.0 - 100.0 fL Final  . MCH 10/20/2018 30.8  26.0 - 34.0 pg Final  . MCHC 10/20/2018 32.0  30.0 - 36.0 g/dL Final  . RDW 13/24/4010 12.4  11.5 - 15.5 % Final  . Platelets 10/20/2018 267  150 - 400 K/uL Final  . nRBC 10/20/2018 0.0  0.0 - 0.2 % Final  . Neutrophils Relative % 10/20/2018 60  % Final  . Neutro Abs 10/20/2018 4.0  1.7 - 7.7 K/uL Final  . Lymphocytes Relative 10/20/2018 25  % Final  . Lymphs Abs 10/20/2018 1.6  0.7 - 4.0 K/uL Final  . Monocytes Relative 10/20/2018 8  % Final  . Monocytes  Absolute 10/20/2018 0.5  0.1 - 1.0 K/uL Final  . Eosinophils Relative 10/20/2018 5  % Final  . Eosinophils Absolute 10/20/2018 0.3  0.0 - 0.5 K/uL Final  . Basophils Relative 10/20/2018 1  % Final  . Basophils Absolute 10/20/2018 0.1  0.0 - 0.1 K/uL Final  . Immature Granulocytes 10/20/2018 1  % Final  . Abs Immature Granulocytes 10/20/2018 0.04  0.00 - 0.07 K/uL Final   Performed at [facility], [address], [place], [place] [address]  . Sodium 10/20/2018 139  135 - 145 mmol/L Final  . Potassium 10/20/2018 3.8  3.5 - 5.1 mmol/L Final  . Chloride 10/20/2018 106  98 - 111 mmol/L Final  . CO2 10/20/2018 27  22 - 32 mmol/L Final  . Glucose, Bld 10/20/2018 97  70 - 99 mg/dL Final  . BUN 66/44/0347 13  6 - 20 mg/dL Final  . Creatinine, Ser 10/20/2018 0.87  0.44 - 1.00 mg/dL Final  . Calcium 42/59/5638 8.5* 8.9 - 10.3 mg/dL Final  . Total Protein 10/20/2018 6.8  6.5 - 8.1 g/dL Final  . Albumin 75/64/3329 3.7  3.5 - 5.0 g/dL Final  . AST 51/88/4166 17  15 - 41 U/L Final  . ALT 10/20/2018 21  0 - 44 U/L Final  . Alkaline Phosphatase 10/20/2018 66  38 - 126 U/L Final  . Total Bilirubin 10/20/2018 0.5  0.3 - 1.2 mg/dL Final  . GFR calc non Af Amer 10/20/2018 >60  >60 mL/min Final  . GFR calc Af Amer 10/20/2018 >60  >60 mL/min Final  . Anion gap 10/20/2018 6  5 - 15 Final   Performed at [facility], [address] [place], [place] [address]  . Ferritin 10/20/2018 107  11 - 307 ng/mL Final   Performed at [facility], [address], [place], [place] [address]  . Iron 10/20/2018 79  28 - 170 ug/dL Final  . TIBC 32/35/5732 306  250 - 450 ug/dL Final  . Saturation Ratios 10/20/2018 26  10.4 - 31.8 % Final  . UIBC 10/20/2018 227  ug/dL Final  Performed at [facility], [address] [place], [place] [address]  . LDH 10/20/2018 130  98 - 192 U/L Final   Performed at [facility], [address], [place], [place] [address]  . Folate 10/20/2018 13.9  >5.9 ng/mL Final   Performed at [facility], [address], [place], [place] [address]     Pathology Orders Placed This Encounter  Procedures  . CBC with Differential/Platelet    Standing Status:   Future    Standing Expiration Date:   10/24/2020  . Comprehensive metabolic panel    Standing Status:   Future    Standing Expiration Date:   10/24/2020  . Lactate dehydrogenase    Standing Status:   Future    Standing Expiration Date:   10/24/2020  . Ferritin    Standing Status:   Future    Standing Expiration Date:   10/24/2020       Ahmed Prima MD

## 2018-10-27 NOTE — Telephone Encounter (Signed)
Prior Authorization approved 10/15/18-10/15/2021.

## 2018-11-03 ENCOUNTER — Encounter: Payer: Self-pay | Admitting: Gastroenterology

## 2018-12-08 ENCOUNTER — Telehealth: Payer: Self-pay

## 2018-12-08 ENCOUNTER — Telehealth: Payer: Self-pay | Admitting: Nurse Practitioner

## 2018-12-08 ENCOUNTER — Other Ambulatory Visit: Payer: Self-pay | Admitting: Neurosurgery

## 2018-12-08 DIAGNOSIS — M5416 Radiculopathy, lumbar region: Secondary | ICD-10-CM

## 2018-12-08 NOTE — Telephone Encounter (Signed)
Phone call to patient to verify medication list and allergies for myelogram procedure. Pt instructed to hold Wellbutrin and Buspar for 48hrs prior to myelogram appointment time. Pt verbalized understanding.

## 2018-12-08 NOTE — Telephone Encounter (Signed)
Copied from CRM [id number]. Topic: Appointment Scheduling - Scheduling Inquiry for Clinic >> Dec 08, 2018  2:46 PM Crist Infante wrote: Reason for CRM: pt states she has a sore throat and ear pain.  States Dr Dayton Martes wanted to see her anyway about some other concerns. Pt states she will probably have to take offf work tomorrow anyway due to being sick, so she prefers to see Dr Dayton Martes if she will work her in tomorrow.

## 2018-12-09 NOTE — Telephone Encounter (Signed)
Pt scheduled for thurs am at 7:20am/thx dmf

## 2018-12-10 ENCOUNTER — Ambulatory Visit: Payer: BC Managed Care – PPO | Admitting: Family Medicine

## 2018-12-10 ENCOUNTER — Encounter: Payer: Self-pay | Admitting: Family Medicine

## 2018-12-10 VITALS — BP 118/84 | HR 81 | Temp 98.0°F | Ht 64.5 in | Wt 280.2 lb

## 2018-12-10 DIAGNOSIS — J060 Acute laryngopharyngitis: Secondary | ICD-10-CM | POA: Diagnosis not present

## 2018-12-10 LAB — POCT RAPID STREP A (OFFICE): Rapid Strep A Screen: NEGATIVE

## 2018-12-10 NOTE — Patient Instructions (Signed)
Drink plenty of fluids, especially water Rest Use nasal saline spray at least 3 times per day Use humidifier at night Try Mucinex 1 tab twice per day Salt water gargles, lozenges, throat spray as needed Follow-up if symptoms worsen or do not improve in 7-10 days

## 2018-12-10 NOTE — Progress Notes (Signed)
[name] is a 43 y.o. female  Chief Complaint  Patient presents with  . Sore Throat    sore throat, lost voice, left ear is aching, coughing, shaky, headache/ 3 days/ OTC allegra D    HPI: [name] is a 43 y.o. female who is a patient of Dr. Dayton Martes. She complains of 3 day h/o hoarse voice, sore throat, mild, non-productive cough, B/L Lt > Rt ear pain.  No fever, chills. No body aches.  She has been using her albuterol inhaler. No wheeze. Some mild SOB. No CP.  She has been taking Allegra D an using chloraseptic spray.   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 Anemia   . Arthritis   . Asthma    EXERCISE INDUCED  . Bursitis of hip   . Depression   . Generalized headaches   . Irregular menses   . Obesity (BMI 30-39.9)     Past Surgical History:  Procedure Laterality Date  . ABDOMINAL HYSTERECTOMY    . ACHILLES TENDON LENGTHENING  1989  . ANKLE GANGLION CYST EXCISION  02/2012   left ankle  . FEET SURGERY  1990   INSERTION OF BONE GRAFT IN FEET  . LAPAROSCOPIC GASTRIC BANDING  10/17/10   Dr Gaynelle Adu; [facility]  . LIPOMA EXCISION  02/2006   abdomen    Social History   Socioeconomic History  . Marital status: Married    Spouse name: Not on file  . Number of children: Not on file  . Years of education: Not on file  . Highest education level: Not on file  Occupational History  . Not on file  Social Needs  . Financial resource strain: Not on file  . Food insecurity:    Worry: Not on file    Inability: Not on file  . Transportation needs:    Medical: Not on file    Non-medical: Not on file  Tobacco Use  . Smoking status: Never Smoker  . Smokeless tobacco: Never Used  Substance and Sexual Activity  . Alcohol use: No  . Drug use: No  . Sexual activity: Yes  Lifestyle  . Physical activity:    Days per week: Not on file     Minutes per session: Not on file  . Stress: Not on file  Relationships  . Social connections:    Talks on phone: Not on file    Gets together: Not on file    Attends religious service: Not on file    Active member of club or organization: Not on file    Attends meetings of clubs or organizations: Not on file    Relationship status: Not on file  . Intimate partner violence:    Fear of current or ex partner: Not on file    Emotionally abused: Not on file    Physically abused: Not on file    Forced sexual activity: Not on file  Other Topics Concern  . Not on file  Social History Narrative  . Not on file    Family History  Problem Relation Age of Onset  . Heart disease Maternal Grandmother        V. Fib arrest  . Hypertension Mother   . Hyperlipidemia Mother   . Diabetes Mother        pre-diabetes  . Hypertension Father   .  Hyperlipidemia Father   . Heart disease Father   . Diabetes Father   . Cancer Father        skin  . Diabetes Sister   . Hypertension Sister   . Cancer Maternal Grandfather        lung and skin  . Cancer Paternal Grandfather        stomach and skin  . Pleurisy Son   . Migraines Son      Immunization History  Administered Date(s) Administered  . Influenza,inj,quad, With Preservative 08/29/2017  . Tdap 01/13/2018    Outpatient Encounter Medications as of 12/10/2018  Medication Sig  . albuterol (PROAIR HFA) 108 (90 BASE) MCG/ACT inhaler Inhale 2 puffs into the lungs every 6 (six) hours as needed for wheezing.  Marland Kitchen. buPROPion (WELLBUTRIN XL) 150 MG 24 hr tablet Take 300 mg by mouth daily.   . busPIRone (BUSPAR) 5 MG tablet Take 5 mg by mouth 2 (two) times daily.  . butalbital-acetaminophen-caffeine (FIORICET, ESGIC) 50-325-40 MG tablet Take 1 tablet by mouth 2 (two) times daily as needed for headache.  . diclofenac sodium (VOLTAREN) 1 % GEL 3 grams to 3 large joints up to 3 times daily  . EPINEPHrine (EPIPEN) 0.3 mg/0.3 mL DEVI Inject 0.3 mLs (0.3  mg total) into the muscle once.  . Ferrous Sulfate (IRON) 142 (45 Fe) MG TBCR Take 2 tablets by mouth every other day. (Patient not taking: Reported on 12/08/2018)  . gabapentin (NEURONTIN) 300 MG capsule Take 300 mg by mouth 2 (two) times daily.   Marland Kitchen. trolamine salicylate (ASPERCREME) 10 % cream Apply 1 application topically as needed for muscle pain.   No facility-administered encounter medications on file as of 12/10/2018.     ROS: Gen: no fever, chills  Skin: no rash, itching ENT: as above in HPI Eyes: no blurry vision, double vision Resp: no cough, wheeze,SOB CV: no CP, palpitations, LE edema,  GI: no heartburn, n/v/d/c, abd pain GU: no dysuria, urgency, frequency, hematuria  MSK: no joint pain, myalgias, back pain Neuro: no dizziness, headache, weakness, vertigo Psych: no depression, anxiety, insomnia   Allergies  Allergen Reactions  . Cashew Nut Oil Anaphylaxis  . Cauliflower [Brassica Oleracea Italica] Anaphylaxis  . Whey Anaphylaxis  . Orange Fruit [Citrus] Other (See Comments)    Migraines   . Augmentin [Amoxicillin-Pot Clavulanate] Diarrhea and Nausea And Vomiting    Has patient had a PCN reaction causing immediate rash, facial/tongue/throat swelling, SOB or lightheadedness with hypotension: No Has patient had a PCN reaction causing severe rash involving mucus membranes or skin necrosis: No Has patient had a PCN reaction that required hospitalization: No Has patient had a PCN reaction occurring within the last 10 years: Unknown If all of the above answers are "NO", then may proceed with Cephalosporin use.   . Codeine     rash  . Milk-Related Compounds   . Other     epidural steroid injection - severe headaches, high fever  . Tree Extract Itching    Tee tree oil    LMP 11/07/2012   Physical Exam  Constitutional: She is oriented to person, place, and time. She appears well-developed and well-nourished. No distress.  HENT:  Head: Normocephalic and atraumatic.    Right Ear: Tympanic membrane and ear canal normal. No middle ear effusion.  Left Ear: Ear canal normal. A middle ear effusion (serous effusion) is present.  Nose: Mucosal edema present. No rhinorrhea. Right sinus exhibits no maxillary sinus tenderness and no frontal sinus tenderness.  Left sinus exhibits no maxillary sinus tenderness and no frontal sinus tenderness.  Mouth/Throat: Mucous membranes are normal. Posterior oropharyngeal erythema present. No oropharyngeal exudate or posterior oropharyngeal edema.  Large tonsils  Neck: Neck supple.  Cardiovascular: Normal rate and regular rhythm.  Pulmonary/Chest: Effort normal and breath sounds normal. No respiratory distress. She has no wheezes.  Lymphadenopathy:    She has no cervical adenopathy.  Neurological: She is alert and oriented to person, place, and time.  Psychiatric: She has a normal mood and affect. Her behavior is normal.     A/P:  1. Sore throat and laryngitis - POCT rapid strep A - negative - cont supportive care to include increased fluids, rest, tylenol or ibuprofen PRN - nasal saline spray - mucinex to help thin out and dry up PND - salt water gargles, throat lozenges and/or spray PRN - note given for work - f/u if symptoms worsen or do not improve in 7-10 days Discussed plan and reviewed medications with patient, including risks, benefits, and potential side effects. Pt expressed understand. All questions answered.  I spent 25 min with the patient today and greater than 50% was spent in counseling, coordination of care, education

## 2018-12-11 ENCOUNTER — Ambulatory Visit: Payer: BC Managed Care – PPO | Admitting: Family Medicine

## 2018-12-19 ENCOUNTER — Inpatient Hospital Stay: Admission: RE | Admit: 2018-12-19 | Payer: BC Managed Care – PPO | Source: Ambulatory Visit

## 2018-12-19 ENCOUNTER — Other Ambulatory Visit: Payer: BC Managed Care – PPO

## 2018-12-19 NOTE — Progress Notes (Signed)
Office Visit Note  Patient: [name]             Date of Birth: [birth year]           MRN: [id number]             PCP: Dianne Dun, MD Referring: Dianne Dun, MD Visit Date: 01/01/2019 Occupation: @GUAROCC @  Subjective:  Other (right hand and left knee pain )   History of Present Illness: [name] is a 43 y.o. female with history of osteoarthritis and positive ANA.  According to patient in the last month she has been having increased pain and discomfort in her bilateral hands.  She is having difficulty gripping objects.  She also has intermittent swelling in her left knee joint.  She states she has difficulty walking and doing routine activities.  Continues to have lower back pain.  Activities of Daily Living:  Patient reports morning stiffness for 0 minute.   Patient Reports nocturnal pain.  Difficulty dressing/grooming: Reports Difficulty climbing stairs: Reports Difficulty getting out of chair: Reports Difficulty using hands for taps, buttons, cutlery, and/or writing: Reports  Review of Systems  Constitutional: Positive for fatigue. Negative for night sweats, weight gain and weight loss.  HENT: Negative for mouth sores, trouble swallowing, trouble swallowing, mouth dryness and nose dryness.   Eyes: Negative for pain, redness, visual disturbance and dryness.  Respiratory: Negative for cough, shortness of breath and difficulty breathing.   Cardiovascular: Negative for chest pain, palpitations, hypertension, irregular heartbeat and swelling in legs/feet.  Gastrointestinal: Negative for blood in stool, constipation and diarrhea.  Endocrine: Negative for increased urination.  Genitourinary: Negative for difficulty urinating, painful urination and vaginal dryness.  Musculoskeletal: Positive for arthralgias, joint pain, joint swelling, myalgias and myalgias. Negative for muscle weakness, morning stiffness and muscle tenderness.  Skin: Negative for color change,  rash, hair loss, skin tightness, ulcers and sensitivity to sunlight.  Allergic/Immunologic: Negative for susceptible to infections.  Neurological: Positive for headaches. Negative for dizziness, numbness, memory loss, night sweats and weakness.  Hematological: Negative for bruising/bleeding tendency and swollen glands.  Psychiatric/Behavioral: Positive for depressed mood and sleep disturbance. The patient is nervous/anxious.     PMFS History:  Patient Active Problem List   Diagnosis Date Noted  . Primary osteoarthritis of both knees 09/17/2018  . Primary osteoarthritis of both hands 09/17/2018  . DDD (degenerative disc disease), lumbar 08/27/2018  . Family history of rheumatoid arthritis 08/27/2018  . Flat foot 08/27/2018  . Multiple food allergies 07/28/2018  . Family history of celiac disease 07/28/2018  . Iron deficiency anemia 02/07/2018  . History of removal of laparoscopic gastric banding device 02/03/2018  . Anemia 01/13/2018  . H/O laparoscopic adjustable gastric banding 07/29/2013  . Bilateral ovarian cysts 11/13/2012  . Depression 01/22/2011  . Decreased sex drive 27/04/8674  . Morbid obesity (HCC) 02/13/2010  . OTHER MALAISE AND FATIGUE 12/20/2008    Past Medical History:  Diagnosis Date  . Anemia   . Arthritis   . Asthma    EXERCISE INDUCED  . Bursitis of hip   . Depression   . Generalized headaches   . Irregular menses   . Obesity (BMI 30-39.9)     Family History  Problem Relation Age of Onset  . Heart disease Maternal Grandmother        V. Fib arrest  . Hypertension Mother   . Hyperlipidemia Mother   . Diabetes Mother        pre-diabetes  .  Hypertension Father   . Hyperlipidemia Father   . Heart disease Father   . Diabetes Father   . Cancer Father        skin  . Diabetes Sister   . Hypertension Sister   . Cancer Maternal Grandfather        lung and skin  . Cancer Paternal Grandfather        stomach and skin  . Pleurisy Son   . Migraines Son      Past Surgical History:  Procedure Laterality Date  . ABDOMINAL HYSTERECTOMY    . ACHILLES TENDON LENGTHENING  1989  . ANKLE GANGLION CYST EXCISION  02/2012   left ankle  . FEET SURGERY  1990   INSERTION OF BONE GRAFT IN FEET  . LAPAROSCOPIC GASTRIC BANDING  10/17/10   Dr Gaynelle Adu; [facility] Standard  . LIPOMA EXCISION  02/2006   abdomen   Social History   Social History Narrative  . Not on file   Immunization History  Administered Date(s) Administered  . Influenza,inj,quad, With Preservative 08/29/2017  . Tdap 01/13/2018     Objective: Vital Signs: BP 130/87 (BP Location: Left Wrist, Patient Position: Sitting, Cuff Size: Normal)   Pulse 82   Resp 15   Ht 5\' 5"  (1.651 m)   Wt 286 lb 6.4 oz (129.9 kg)   LMP 11/07/2012   BMI 47.66 kg/m    Physical Exam Vitals signs and nursing note reviewed.  Constitutional:      Appearance: She is well-developed.  HENT:     Head: Normocephalic and atraumatic.  Eyes:     Conjunctiva/sclera: Conjunctivae normal.  Neck:     Musculoskeletal: Normal range of motion.  Cardiovascular:     Rate and Rhythm: Normal rate and regular rhythm.     Heart sounds: Normal heart sounds.  Pulmonary:     Effort: Pulmonary effort is normal.     Breath sounds: Normal breath sounds.  Abdominal:     General: Bowel sounds are normal.     Palpations: Abdomen is soft.  Lymphadenopathy:     Cervical: No cervical adenopathy.  Skin:    General: Skin is warm and dry.     Capillary Refill: Capillary refill takes less than 2 seconds.  Neurological:     Mental Status: She is alert and oriented to person, place, and time.  Psychiatric:        Behavior: Behavior normal.      Musculoskeletal Exam: C-spine is normal range of motion.  Lumbar spine discomfort range of motion.  Shoulder joints elbow joints wrist joints with good range of motion.  She has no synovitis on examination over wrist joint MCPs.  She has DIP and PIP mild thickening.  She complains of  tenderness over MCP joints.  Hip joints knee joints ankles MTPs PIPs been good range of motion.  She complains of discomfort range of motion of her left knee joint.  No warmth swelling or effusion was noted.  She has generalized hyperalgesia and positive tender points.  CDAI Exam: CDAI Score: Not documented Patient Global Assessment: Not documented; Provider Global Assessment: Not documented Swollen: Not documented; Tender: Not documented Joint Exam   Not documented   There is currently no information documented on the homunculus. Go to the Rheumatology activity and complete the homunculus joint exam.  Investigation: No additional findings.  Imaging: [place] Extrem Up Bilat Comp  Result Date: 01/01/2019 Ultrasound examination of bilateral hands was performed per EULAR recommendations. Using 12 MHz transducer,  grayscale and power Doppler bilateral second, third, and fifth MCP joints and bilateral wrist joints both dorsal and volar aspects were evaluated to look for synovitis or tenosynovitis. The findings were there was no synovitis or tenosynovitis on ultrasound examination. Right median nerve was 0.07 cm squares which was within normal limits and left median nerve was 0.08 cm squares which was within normal limits. Impression: Ultrasound examination did not show any synovitis.  Bilateral median nerves within normal limits.   Recent Labs: Lab Results  Component Value Date   WBC 6.6 10/20/2018   HGB 13.5 10/20/2018   PLT 267 10/20/2018   NA 139 10/20/2018   K 3.8 10/20/2018   CL 106 10/20/2018   CO2 27 10/20/2018   GLUCOSE 97 10/20/2018   BUN 13 10/20/2018   CREATININE 0.87 10/20/2018   BILITOT 0.5 10/20/2018   ALKPHOS 66 10/20/2018   AST 17 10/20/2018   ALT 21 10/20/2018   PROT 6.8 10/20/2018   ALBUMIN 3.7 10/20/2018   CALCIUM 8.5 (L) 10/20/2018   GFRAA >60 10/20/2018    Speciality Comments: No specialty comments available.  Procedures:  No procedures performed Allergies:  Cashew nut oil; Cauliflower [brassica oleracea italica]; Whey; Orange fruit [citrus]; Augmentin [amoxicillin-pot clavulanate]; Codeine; Milk-related compounds; Other; and Tree extract   Assessment / Plan:     Visit Diagnoses: Pain in both hands -patient complains of pain and swelling in her bilateral hands.  She complains of decreased muscle strength.  She had no synovitis on examination.  As she was having a lot of discomfort we decided to proceed with ultrasound examination.  The ultrasound did not show any synovitis.  Bilateral median nerves were within normal limits.  Plan: US Extrem Up Bilat Comp  Primary osteoarthritis of both hands - Mild  Primary osteoarthritis of both knees - Moderate OA and moderate chondromalacia patella.  She has been complaining of left knee joint intermittent swelling and discomfort.  No warmth swelling or effusion was noted.  She has underlying osteoarthritis.  Weight loss diet and exercise was discussed.  I offered cortisone injection which she declined.  Pes planus of both feet  Myofascial pain-she complains of generalized pain insomnia and positive tender points on examination today.  Detailed counseling guarding myofascial pain syndrome was provided.  I discussed that she may benefit from Cymbalta and treatment of insomnia.  Have advised her to discuss that further with her PCP.  Need for regular exercise was also encouraged.  DDD (degenerative disc disease), lumbar-she continues to have some lower back pain.  She has been getting frequent cortisone injections.  Positive ANA (antinuclear antibody) - ANA 1: 320 speckled, ENA negative.  Patient has no clinical features of autoimmune disease.   Family history of celiac disease  Family history of rheumatoid arthritis  Bilateral ovarian cysts  History of asthma  History of anemia  Anxiety and depression   Orders: Orders Placed This Encounter  Procedures  . US Extrem Up Bilat Comp   No orders of the  defined types were placed in this encounter.   Face-to-face time spent with patient was 35 minutes. Greater than 50% of time was spent in counseling and coordination of care.  Follow-Up Instructions: No follow-ups on file.   Pollyann SavoyShaili Brenner Visconti, MD  Note - This record has been created using Animal nutritionistDragon software.  Chart creation errors have been sought, but may not always  have been located. Such creation errors do not reflect on  the standard of medical care.

## 2019-01-01 ENCOUNTER — Encounter: Payer: Self-pay | Admitting: Rheumatology

## 2019-01-01 ENCOUNTER — Ambulatory Visit: Payer: BC Managed Care – PPO | Admitting: Rheumatology

## 2019-01-01 ENCOUNTER — Ambulatory Visit (INDEPENDENT_AMBULATORY_CARE_PROVIDER_SITE_OTHER): Payer: Self-pay

## 2019-01-01 VITALS — BP 130/87 | HR 82 | Resp 15 | Ht 65.0 in | Wt 286.4 lb

## 2019-01-01 DIAGNOSIS — N83201 Unspecified ovarian cyst, right side: Secondary | ICD-10-CM

## 2019-01-01 DIAGNOSIS — M19041 Primary osteoarthritis, right hand: Secondary | ICD-10-CM | POA: Diagnosis not present

## 2019-01-01 DIAGNOSIS — Z8709 Personal history of other diseases of the respiratory system: Secondary | ICD-10-CM

## 2019-01-01 DIAGNOSIS — M2142 Flat foot [pes planus] (acquired), left foot: Secondary | ICD-10-CM

## 2019-01-01 DIAGNOSIS — M79642 Pain in left hand: Secondary | ICD-10-CM | POA: Diagnosis not present

## 2019-01-01 DIAGNOSIS — M79641 Pain in right hand: Secondary | ICD-10-CM

## 2019-01-01 DIAGNOSIS — Z8261 Family history of arthritis: Secondary | ICD-10-CM

## 2019-01-01 DIAGNOSIS — M5136 Other intervertebral disc degeneration, lumbar region: Secondary | ICD-10-CM

## 2019-01-01 DIAGNOSIS — M2141 Flat foot [pes planus] (acquired), right foot: Secondary | ICD-10-CM | POA: Diagnosis not present

## 2019-01-01 DIAGNOSIS — Z862 Personal history of diseases of the blood and blood-forming organs and certain disorders involving the immune mechanism: Secondary | ICD-10-CM

## 2019-01-01 DIAGNOSIS — Z8379 Family history of other diseases of the digestive system: Secondary | ICD-10-CM

## 2019-01-01 DIAGNOSIS — N83202 Unspecified ovarian cyst, left side: Secondary | ICD-10-CM

## 2019-01-01 DIAGNOSIS — M7918 Myalgia, other site: Secondary | ICD-10-CM

## 2019-01-01 DIAGNOSIS — F32A Depression, unspecified: Secondary | ICD-10-CM

## 2019-01-01 DIAGNOSIS — R768 Other specified abnormal immunological findings in serum: Secondary | ICD-10-CM

## 2019-01-01 DIAGNOSIS — F329 Major depressive disorder, single episode, unspecified: Secondary | ICD-10-CM

## 2019-01-01 DIAGNOSIS — M51369 Other intervertebral disc degeneration, lumbar region without mention of lumbar back pain or lower extremity pain: Secondary | ICD-10-CM

## 2019-01-01 DIAGNOSIS — F419 Anxiety disorder, unspecified: Secondary | ICD-10-CM

## 2019-01-01 DIAGNOSIS — M19042 Primary osteoarthritis, left hand: Secondary | ICD-10-CM

## 2019-01-01 DIAGNOSIS — M170 Bilateral primary osteoarthritis of knee: Secondary | ICD-10-CM

## 2019-01-02 ENCOUNTER — Ambulatory Visit
Admission: RE | Admit: 2019-01-02 | Discharge: 2019-01-02 | Disposition: A | Discharge status: Home / Self Care | Payer: BC Managed Care – PPO | Source: Ambulatory Visit | Attending: Neurosurgery | Admitting: Neurosurgery

## 2019-01-02 DIAGNOSIS — M5416 Radiculopathy, lumbar region: Secondary | ICD-10-CM

## 2019-01-02 MED ORDER — DIAZEPAM 5 MG PO TABS
10.0000 mg | ORAL_TABLET | Freq: Once | ORAL | Status: AC
  Administered 2019-01-02: 10 mg via ORAL

## 2019-01-02 MED ORDER — IOPAMIDOL (ISOVUE-M 200) INJECTION 41%
15.0000 mL | Freq: Once | INTRAMUSCULAR | Status: AC
  Administered 2019-01-02: 15 mL via INTRATHECAL

## 2019-01-02 MED ORDER — ONDANSETRON HCL 4 MG/2ML IJ SOLN: 4.0000 mg | Freq: Four times a day (QID) | INTRAMUSCULAR | Status: DC | PRN

## 2019-01-02 NOTE — Discharge Instructions (Signed)
Myelogram Discharge Instructions  1. Go home and rest quietly for the next 24 hours.  It is important to lie flat for the next 24 hours.  Get up only to go to the restroom.  You may lie in the bed or on a couch on your back, your stomach, your left side or your right side.  You may have one pillow under your head.  You may have pillows between your knees while you are on your side or under your knees while you are on your back.  2. DO NOT drive today.  Recline the seat as far back as it will go, while still wearing your seat belt, on the way home.  3. You may get up to go to the bathroom as needed.  You may sit up for 10 minutes to eat.  You may resume your normal diet and medications unless otherwise indicated.  Drink lots of extra fluids today and tomorrow.  4. The incidence of headache, nausea, or vomiting is about 5% (one in 20 patients).  If you develop a headache, lie flat and drink plenty of fluids until the headache goes away.  Caffeinated beverages may be helpful.  If you develop severe nausea and vomiting or a headache that does not go away with flat bed rest, call [phone].  5. You may resume normal activities after your 24 hours of bed rest is over; however, do not exert yourself strongly or do any heavy lifting tomorrow. If when you get up you have a headache when standing, go back to bed and force fluids for another 24 hours.  6. Call your physician for a follow-up appointment.  The results of your myelogram will be sent directly to your physician by the following day.  7. If you have any questions or if complications develop after you arrive home, please call [phone].  Discharge instructions have been explained to the patient.  The patient, or the person responsible for the patient, fully understands these instructions.  YOU MAY RESTART YOUR WELLBUTRIN AND BUSPAR TOMORROW 01/03/2019 AT 1:00PM.

## 2019-01-02 NOTE — Progress Notes (Signed)
Patient states she has been off Wellbutrin and Buspar for at least the past two days.

## 2019-01-07 ENCOUNTER — Encounter: Payer: Self-pay | Admitting: Gastroenterology

## 2019-01-07 ENCOUNTER — Encounter: Payer: Self-pay | Admitting: *Deleted

## 2019-01-07 ENCOUNTER — Other Ambulatory Visit: Payer: Self-pay

## 2019-01-07 ENCOUNTER — Other Ambulatory Visit: Payer: Self-pay | Admitting: *Deleted

## 2019-01-07 ENCOUNTER — Ambulatory Visit: Payer: BC Managed Care – PPO | Admitting: Gastroenterology

## 2019-01-07 ENCOUNTER — Telehealth: Payer: Self-pay | Admitting: Gastroenterology

## 2019-01-07 VITALS — BP 157/95 | HR 84 | Temp 97.0°F | Ht 65.0 in | Wt 283.8 lb

## 2019-01-07 DIAGNOSIS — D509 Iron deficiency anemia, unspecified: Secondary | ICD-10-CM | POA: Diagnosis not present

## 2019-01-07 NOTE — Telephone Encounter (Signed)
Please let patient know that ingredient list for Plenvu does not contrain whey protein.   Contains: polyethylene glycol, sodium chloride, potassium chloride, sodium sulfate, sodium ascorbate, ascorbic acid, sucralose, citric acid, aspartame, fruit punch flavoring and mango flavoring.   Of courses given her history of allergies she should always be looking for potential symptoms with any medication.

## 2019-01-07 NOTE — Progress Notes (Signed)
Primary Care Physician:  Dianne Dun, MD Referring provider: Dr. Ahmed Prima Primary Gastroenterologist:  Roetta Sessions, MD   Chief Complaint  Patient presents with  . Anemia    HPI:  [name] is a 43 y.o. female here at the request of Dr. Melton Alar for further evaluation of iron deficiency anemia.  Patient has a history of gastric band procedure done in 2011. She had gastric band removed in 01/2018 due to evidence of slippage of the band and herniation of the entire gastric pouch above the band causing obstructive symptoms. She had suffered with N/V for 9 months before the cause was found. Admits to poor oral intake and weight loss due to chronic vomiting. During hospitalization for her gastric band she was found to have IDA. Ultimately required several iron infusions (four) with the last two in September 2019.  On B12 orally for B12 deficiency.  Celiac serologies negative in September 2019.  Heme negative x2 in September 2019.  Patient's ferritin went from 7 up to 86 after initial two iron infusions. Then three months later was back down to 48. Ferritin up to 107 after last two infusions. Last labs in 09/2018 with plans to update in 01/2019. Hgb 12/2017 was 10.1, up to 13.5 in 09/2018.   Patient had a remote TCS in 2001 for bowel issues at that time.   Currently has normal BM every other day. [place] 3-4. No melena, brbpr. Since her band was removed she has had no reflux symptoms and was able to come off protonix which she was on during the 9 months she experienced regurgitation/vomiting. According her her op note, she had large diaphragmatic defect with hiatal hernia.  She takes motrin rarely now. Over the past year she was taking a lot of Aleve but has now stopped. Never able to take large pills. No other dysphagia.     Current Outpatient Medications  Medication Sig Dispense Refill  . albuterol (PROAIR HFA) 108 (90 BASE) MCG/ACT inhaler Inhale 2 puffs into the lungs every 6 (six) hours  as needed for wheezing.    Marland Kitchen buPROPion (WELLBUTRIN XL) 150 MG 24 hr tablet Take 300 mg by mouth daily.     . busPIRone (BUSPAR) 5 MG tablet Take 5 mg by mouth 2 (two) times daily.    . butalbital-acetaminophen-caffeine (FIORICET, ESGIC) 50-325-40 MG tablet Take 1 tablet by mouth 2 (two) times daily as needed for headache.    . diclofenac sodium (VOLTAREN) 1 % GEL 3 grams to 3 large joints up to 3 times daily 3 Tube 3  . EPINEPHrine (EPIPEN) 0.3 mg/0.3 mL DEVI Inject 0.3 mLs (0.3 mg total) into the muscle once. 1 Device 0  . gabapentin (NEURONTIN) 300 MG capsule Take 300 mg by mouth 2 (two) times daily.     Marland Kitchen trolamine salicylate (ASPERCREME) 10 % cream Apply 1 application topically as needed for muscle pain.     No current facility-administered medications for this visit.     Allergies as of 01/07/2019 - Review Complete 01/07/2019  Allergen Reaction Noted  . 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 Whey Anaphylaxis 10/21/2016  . Augmentin [amoxicillin-pot clavulanate] Diarrhea and Nausea And Vomiting 12/10/2011  . Orange fruit [citrus] Other (See Comments) 12/16/2017  . Milk-related compounds  05/28/2013  . Other  01/28/2018  . Codeine Rash   . Tree extract Itching 05/28/2013    Past Medical History:  Diagnosis Date  . Anemia   .  Arthritis   . Asthma    EXERCISE INDUCED  . Bursitis of hip   . Depression   . Generalized headaches   . Irregular menses   . Obesity (BMI 30-39.9)     Past Surgical History:  Procedure Laterality Date  . ABDOMINAL HYSTERECTOMY    . ACHILLES TENDON LENGTHENING  1989  . ANKLE GANGLION CYST EXCISION  02/2012   left ankle  . DILATION AND CURETTAGE OF UTERUS  2003  . FEET SURGERY  1990   INSERTION OF BONE GRAFT IN FEET  . LAPAROSCOPIC GASTRIC BANDING  10/17/10   Dr Gaynelle Adu; [facility] Standard  . LAPAROSCOPIC REPAIR AND REMOVAL OF GASTRIC BAND  01/2018  . LIPOMA EXCISION  02/2006    abdomen    Family History  Problem Relation Age of Onset  . Heart disease Maternal Grandmother        V. Fib arrest  . Hypertension Mother   . Hyperlipidemia Mother   . Diabetes Mother        pre-diabetes  . Hypertension Father   . Hyperlipidemia Father   . Heart disease Father   . Diabetes Father   . Cancer Father        skin  . Diabetes Sister   . Hypertension Sister   . Cancer Maternal Grandfather        lung and skin  . Colon cancer Maternal Grandfather   . Cancer Paternal Grandfather        stomach and skin  . Pleurisy Son   . Migraines Son   . Colon polyps Other        aunt and uncle    Social History   Socioeconomic History  . Marital status: Married    Spouse name: Not on file  . Number of children: Not on file  . Years of education: Not on file  . Highest education level: Not on file  Occupational History  . Not on file  Social Needs  . Financial resource strain: Not on file  . Food insecurity:    Worry: Not on file    Inability: Not on file  . Transportation needs:    Medical: Not on file    Non-medical: Not on file  Tobacco Use  . Smoking status: Never Smoker  . Smokeless tobacco: Never Used  Substance and Sexual Activity  . Alcohol use: No  . Drug use: No  . Sexual activity: Yes  Lifestyle  . Physical activity:    Days per week: Not on file    Minutes per session: Not on file  . Stress: Not on file  Relationships  . Social connections:    Talks on phone: Not on file    Gets together: Not on file    Attends religious service: Not on file    Active member of club or organization: Not on file    Attends meetings of clubs or organizations: Not on file    Relationship status: Not on file  . Intimate partner violence:    Fear of current or ex partner: Not on file    Emotionally abused: Not on file    Physically abused: Not on file    Forced sexual activity: Not on file  Other Topics Concern  . Not on file  Social History Narrative  .  Not on file      ROS:  General: Negative for anorexia, weight loss, fever, chills, fatigue, weakness. Eyes: Negative for vision changes.  ENT: Negative for  hoarseness, difficulty swallowing , nasal congestion. CV: Negative for chest pain, angina, palpitations, dyspnea on exertion, peripheral edema.  Respiratory: Negative for dyspnea at rest, dyspnea on exertion, cough, sputum, wheezing.  GI: See history of present illness. GU:  Negative for dysuria, hematuria, urinary incontinence, urinary frequency, nocturnal urination.  MS: positive for joint pain, low back pain.  Derm: Negative for rash or itching.  Neuro: Negative for weakness, abnormal sensation, seizure, frequent headaches, memory loss, confusion.  Psych: Negative for anxiety, depression, suicidal ideation, hallucinations.  Endo: Negative for unusual weight change.  Heme: Negative for bruising or bleeding. Allergy: Negative for rash or hives.    Physical Examination:  BP (!) 157/95   Pulse 84   Temp (!) 97 F (36.1 C) (Oral)   Ht  (1.651 m)   Wt 283 lb 12.8 oz (128.7 kg)   LMP 11/07/2012   BMI 47.23 kg/m    General: Well-nourished, well-developed in no acute distress.  Head: Normocephalic, atraumatic.   Eyes: Conjunctiva pink, no icterus. Mouth: Oropharyngeal mucosa moist and pink , no lesions erythema or exudate. Neck: Supple without thyromegaly, masses, or lymphadenopathy.  Lungs: Clear to auscultation bilaterally.  Heart: Regular rate and rhythm, no murmurs rubs or gallops.  Abdomen: Bowel sounds are normal, nontender, nondistended, no hepatosplenomegaly or masses, no abdominal bruits or    hernia , no rebound or guarding.   Rectal: not performed Extremities: No lower extremity edema. No clubbing or deformities.  Neuro: Alert and oriented x 4 , grossly normal neurologically.  Skin: Warm and dry, no rash or jaundice.   Psych: Alert and cooperative, normal mood and affect.  Labs: Lab Results  Component  Value Date   CREATININE 0.87 10/20/2018   BUN 13 10/20/2018   NA 139 10/20/2018   K 3.8 10/20/2018   CL 106 10/20/2018   CO2 27 10/20/2018   Lab Results  Component Value Date   WBC 6.6 10/20/2018   HGB 13.5 10/20/2018   HCT 42.2 10/20/2018   MCV 96.3 10/20/2018   PLT 267 10/20/2018   Lab Results  Component Value Date   ALT 21 10/20/2018   AST 17 10/20/2018   ALKPHOS 66 10/20/2018   BILITOT 0.5 10/20/2018   Lab Results  Component Value Date   IRON 79 10/20/2018   TIBC 306 10/20/2018   FERRITIN 107 10/20/2018   Lab Results  Component Value Date   VITAMINB12 270 06/19/2018   Lab Results  Component Value Date   FOLATE 13.9 10/20/2018     Imaging Studies: Ct Lumbar Spine W Contrast  Result Date: 01/02/2019 CLINICAL DATA:  Back pain EXAM: CT MYELOGRAPHY LUMBAR SPINE TECHNIQUE: CT imaging of the lumbar spine was performed after Isovue 200M contrast administration. Multiplanar CT image reconstructions were also generated. COMPARISON:  11/14/2017 FINDINGS: Radiologist injection Segmentation: 5 lumbar type vertebral bodies. The left side of L5 is sacralized. Alignment: Mild levoscoliosis at L2-3. Vertebrae: There is no vertebral compression deformity. Conus medullaris: Extends to the upper L1 level and appears normal. Paraspinal and other soft tissues: No abnormal soft tissue mass. No evidence of aortic aneurysm. Disc levels: T11-T12: Right paracentral and foraminal disc protrusion effaces the anterior thecal sac to the cord. No evidence of central canal stenosis. T12-L1: Unremarkable L1-2: Unremarkable. L2-3: Right paracentral and foraminal disc protrusion is associated with disc narrowing and vacuum disc. This causes significant right lateral recess and foraminal narrowing. It could result in symptoms from the right L2 or L3 nerve roots. No significant central  stenosis. Left foramen is widely patent. L3-4: Shallow central disc protrusion without narrowing of the central canal or  lateral recesses. Foramina are widely patent. L4-5: Unremarkable appearance of the disc. Mild facet arthropathy right greater than left. There is mild narrowing of the right lateral recess secondary to anterior facet overgrowth. L5-S1: Moderate disc space narrowing. Minimal disc osteophytes encroach upon the base of the neural foramina bilaterally without canal compromise. Mild facet arthropathy bilaterally. There is mild narrowing of the lateral recesses secondary to anterior facet overgrowth. IMPRESSION: Right paracentral and foraminal disc protrusion at T11-T12. Right paracentral and foraminal disc protrusion at L2-3. Shallow central protrusion at L4-5 without compromise. Mild facet arthropathy at L4-5 with mild right lateral recess narrowing. Mild facet arthropathy bilaterally at L5-S1 with mild narrowing of the lateral recesses. Electronically Signed   By: Jolaine Click M.D.   On: 01/02/2019 16:02   [place] Extrem Up Bilat Comp  Result Date: 01/01/2019 Ultrasound examination of bilateral hands was performed per EULAR recommendations. Using 12 MHz transducer, grayscale and power Doppler bilateral second, third, and fifth MCP joints and bilateral wrist joints both dorsal and volar aspects were evaluated to look for synovitis or tenosynovitis. The findings were there was no synovitis or tenosynovitis on ultrasound examination. Right median nerve was 0.07 cm squares which was within normal limits and left median nerve was 0.08 cm squares which was within normal limits. Impression: Ultrasound examination did not show any synovitis.  Bilateral median nerves within normal limits.  Dg Myelography Lumbar Inj Lumbosacral  Result Date: 01/02/2019 CLINICAL DATA:  Low back pain EXAM: LUMBAR MYELOGRAM FLUOROSCOPY TIME:  36 seconds.  19.9 mGy. PROCEDURE: After thorough discussion of risks and benefits of the procedure including bleeding, infection, injury to nerves, blood vessels, adjacent structures as well as headache and  CSF leak, written and oral informed consent was obtained. Consent was obtained by Dr. Jolaine Click. Time out form was completed. Patient was positioned prone on the fluoroscopy table. Local anesthesia was provided with 1% lidocaine without epinephrine after prepped and draped in the usual sterile fashion. Puncture was performed at L3-4 using a 3 1/2 inch 22-gauge spinal needle via a left paramedian approach. Using a single pass through the dura, the needle was placed within the thecal sac, with return of clear CSF. 15 mL of Isovue M-200 was injected into the thecal sac, with normal opacification of the nerve roots and cauda equina consistent with free flow within the subarachnoid space. I personally performed the lumbar puncture and administered the intrathecal contrast. I also personally supervised acquisition of the myelogram images. TECHNIQUE: Contiguous axial images were obtained through the Lumbar spine after the intrathecal infusion of infusion. Coronal and sagittal reconstructions were obtained of the axial image sets. COMPARISON:  11/14/2017 FINDINGS: Mild levoscoliosis at L3-4. There is anatomic alignment on the lateral view in the upright standing position. No vertebral compression deformity. There is moderate anterior epidural mass effect at L3-4. Mild anterior epidural mass effect at L1-2 and L2-3. There is significant narrowing of the L2-3 disc. No obvious spinal stenosis. Flexion and extension views demonstrate limited range of motion but no abnormal motion to suggest instability. IMPRESSION: Mild scattered degenerative disc disease.  CT myelogram to follow. Electronically Signed   By: Jolaine Click M.D.   On: 01/02/2019 14:22

## 2019-01-07 NOTE — Patient Instructions (Signed)
1. Colonoscopy with possible upper endoscopy to evaluate iron deficiency anemia. I will also allow enough time for a small bowel camera to be placed at time of procedures if needed since you are unable to swallow large pills. When Dr. Jena Gauss gets back next week, we will discuss plan and we will let you know if there are any changes.

## 2019-01-07 NOTE — Assessment & Plan Note (Signed)
Pleasant 43 y/o female presents for further evaluation of IDA requiring iron infusions. Heme negative last year. No overt GI bleeding. UGI symptoms resolved once gastric band removed for slippage. She has gained a lot of her weight back. No longer on PPI as regurgitation, heartburn, vomiting have now resolved. Discussed with patient, would consider colonoscopy with possible EGD if needed to evaluate for IDA. If negative she may require capsule endoscopy. Patient voiced concerns about ability to swallow PillCam due to size. Consider placement at time of EGD if needed. I will discuss plan with Dr. Jena Gauss upon his return.  I have discussed the risks, alternatives, benefits with regards to but not limited to the risk of reaction to medication, bleeding, infection, perforation and the patient is agreeable to proceed. Written consent to be obtained.  Patient has been provided Plenvu bowel prep. Due to her allergies, I did request active/inactive ingredient list from our drug rep. There appears to be no ingredients corresponding to her listed drug/food allergies.

## 2019-01-08 ENCOUNTER — Telehealth: Payer: Self-pay | Admitting: *Deleted

## 2019-01-08 ENCOUNTER — Other Ambulatory Visit: Payer: Self-pay

## 2019-01-08 ENCOUNTER — Encounter: Payer: Self-pay | Admitting: Family Medicine

## 2019-01-08 ENCOUNTER — Ambulatory Visit: Payer: BC Managed Care – PPO | Admitting: Family Medicine

## 2019-01-08 VITALS — BP 132/82 | HR 88 | Temp 98.9°F | Ht 65.0 in | Wt 283.0 lb

## 2019-01-08 DIAGNOSIS — M19041 Primary osteoarthritis, right hand: Secondary | ICD-10-CM

## 2019-01-08 DIAGNOSIS — M19042 Primary osteoarthritis, left hand: Secondary | ICD-10-CM | POA: Diagnosis not present

## 2019-01-08 DIAGNOSIS — F339 Major depressive disorder, recurrent, unspecified: Secondary | ICD-10-CM

## 2019-01-08 MED ORDER — DULOXETINE HCL 30 MG PO CPEP: 30.0000 mg | ORAL_CAPSULE | Freq: Every day | ORAL | 3 refills | Status: DC

## 2019-01-08 NOTE — Patient Instructions (Signed)
1.) Please call [facility] @ [phone].  Sharrie Rothman and Spero Geralds are both great women!  2.) Schedule a 42-month-F/U with Dr. Dayton Martes first appointment in the am at 7:20am :)

## 2019-01-08 NOTE — Telephone Encounter (Signed)
Called [employer] and spoke with Langston Masker. Was advised no PA is required for CPT code 70929. Ref# [id number]

## 2019-01-08 NOTE — Telephone Encounter (Signed)
Pt notified of ingredients of Plenvu.

## 2019-01-08 NOTE — Progress Notes (Signed)
Subjective:   Patient ID: [name], female    DOB: 03/05/1976, 43 y.o.   MRN: [id number]  [name] is a pleasant 43 y.o. year old female who presents to clinic today with Hand Pain (Patient is here today C/O pain in her right hand.  Rheumatologist feels that she has Fibro but does not want to list that as a Dx for ins reasons.  She takes Gaba 300mg  bid-tid and is somewhat helpful but still in a lot of pain.  Rheum suggested Cymbalta.) and Depression & Anxiety (She is also here today to discuss her depression & anxiety.  She currently takes Buspirone 5mg  bid, Buprpion XL 300mg  1qd  She does not feel that Bupropion is helping much.)  on 01/08/2019  HPI:  Anxiety and depression- deteriorated. Has seen a rheumatologist and diagnosed with fibromyalgia.  Takes Gabapentin 300 mg twice daily - three times daily. Currently taking Wellbutrin 300 mg XL daily and buspar 5 mg twice daily.  She denies feeling suicidal.    Depression screen [facility] 2/9 01/08/2019  Decreased Interest 3  Down, Depressed, Hopeless 3  PHQ - 2 Score 6  Altered sleeping 2  Tired, decreased energy 3  Change in appetite 3  Feeling bad or failure about yourself  3  Trouble concentrating 2  Moving slowly or fidgety/restless 1  Suicidal thoughts 0  PHQ-9 Score 20  Difficult doing work/chores Very difficult   GAD 7 : Generalized Anxiety Score 01/08/2019  Nervous, Anxious, on Edge 3  Control/stop worrying 2  Worry too much - different things 1  Trouble relaxing 2  Restless 0  Easily annoyed or irritable 3  Afraid - awful might happen 0  Total GAD 7 Score 11  Anxiety Difficulty Very difficult      Current Outpatient Medications on File Prior to Visit  Medication Sig Dispense Refill  . albuterol (PROAIR HFA) 108 (90 BASE) MCG/ACT inhaler Inhale 2 puffs into the lungs every 6 (six) hours as needed for wheezing.    Marland Kitchen buPROPion (WELLBUTRIN XL) 150 MG 24 hr tablet Take 300 mg by mouth daily.     . busPIRone  (BUSPAR) 5 MG tablet Take 5 mg by mouth 2 (two) times daily.    . butalbital-acetaminophen-caffeine (FIORICET, ESGIC) 50-325-40 MG tablet Take 1 tablet by mouth 2 (two) times daily as needed for headache.    . diclofenac sodium (VOLTAREN) 1 % GEL 3 grams to 3 large joints up to 3 times daily 3 Tube 3  . EPINEPHrine (EPIPEN) 0.3 mg/0.3 mL DEVI Inject 0.3 mLs (0.3 mg total) into the muscle once. 1 Device 0  . gabapentin (NEURONTIN) 300 MG capsule Take 300 mg by mouth 2 (two) times daily.     Marland Kitchen trolamine salicylate (ASPERCREME) 10 % cream Apply 1 application topically as needed for muscle pain.     No current facility-administered medications on file prior to visit.     Allergies  Allergen Reactions  . Cashew Nut Oil Anaphylaxis  . Cauliflower [Brassica Oleracea Italica] Anaphylaxis  . Whey Anaphylaxis  . Augmentin [Amoxicillin-Pot Clavulanate] Diarrhea and Nausea And Vomiting    Has patient had a PCN reaction causing immediate rash, facial/tongue/throat swelling, SOB or lightheadedness with hypotension: No Has patient had a PCN reaction causing severe rash involving mucus membranes or skin necrosis: No Has patient had a PCN reaction that required hospitalization: No Has patient had a PCN reaction occurring within the last 10 years: Unknown If all of the above answers  are "NO", then may proceed with Cephalosporin use.   Erskine Emery Fruit [Citrus] Other (See Comments)    Migraines   . Milk-Related Compounds   . Other     epidural steroid injection - severe headaches, high fever  . Codeine Rash  . Tree Extract Itching    Tee tree oil    Past Medical History:  Diagnosis Date  . Anemia   . Arthritis   . Asthma    EXERCISE INDUCED  . Bursitis of hip   . Depression   . Generalized headaches   . Irregular menses   . Obesity (BMI 30-39.9)     Past Surgical History:  Procedure Laterality Date  . ABDOMINAL HYSTERECTOMY    . ACHILLES TENDON LENGTHENING  1989  . ANKLE GANGLION CYST  EXCISION  02/2012   left ankle  . DILATION AND CURETTAGE OF UTERUS  2003  . FEET SURGERY  1990   INSERTION OF BONE GRAFT IN FEET  . LAPAROSCOPIC GASTRIC BANDING  10/17/10   Dr Gaynelle Adu; [facility] Standard  . LAPAROSCOPIC REPAIR AND REMOVAL OF GASTRIC BAND  01/2018  . LIPOMA EXCISION  02/2006   abdomen    Family History  Problem Relation Age of Onset  . Heart disease Maternal Grandmother        V. Fib arrest  . Hypertension Mother   . Hyperlipidemia Mother   . Diabetes Mother        pre-diabetes  . Hypertension Father   . Hyperlipidemia Father   . Heart disease Father   . Diabetes Father   . Cancer Father        skin  . Diabetes Sister   . Hypertension Sister   . Cancer Maternal Grandfather        lung and skin  . Colon cancer Maternal Grandfather   . Cancer Paternal Grandfather        stomach and skin  . Pleurisy Son   . Migraines Son   . Colon polyps Other        aunt and uncle    Social History   Socioeconomic History  . Marital status: Married    Spouse name: Not on file  . Number of children: Not on file  . Years of education: Not on file  . Highest education level: Not on file  Occupational History  . Not on file  Social Needs  . Financial resource strain: Not on file  . Food insecurity:    Worry: Not on file    Inability: Not on file  . Transportation needs:    Medical: Not on file    Non-medical: Not on file  Tobacco Use  . Smoking status: Never Smoker  . Smokeless tobacco: Never Used  Substance and Sexual Activity  . Alcohol use: No  . Drug use: No  . Sexual activity: Yes  Lifestyle  . Physical activity:    Days per week: Not on file    Minutes per session: Not on file  . Stress: Not on file  Relationships  . Social connections:    Talks on phone: Not on file    Gets together: Not on file    Attends religious service: Not on file    Active member of club or organization: Not on file    Attends meetings of clubs or organizations: Not on file     Relationship status: Not on file  . Intimate partner violence:    Fear of current or ex partner:  Not on file    Emotionally abused: Not on file    Physically abused: Not on file    Forced sexual activity: Not on file  Other Topics Concern  . Not on file  Social History Narrative  . Not on file   The PMH, PSH, Social History, Family History, Medications, and allergies have been reviewed in [facility], and have been updated if relevant.   Review of Systems  Constitutional: Negative.   Musculoskeletal: Positive for arthralgias and myalgias.  Psychiatric/Behavioral: Positive for decreased concentration, dysphoric mood and sleep disturbance. Negative for agitation, behavioral problems, confusion, hallucinations, self-injury and suicidal ideas. The patient is nervous/anxious. The patient is not hyperactive.   All other systems reviewed and are negative.      Objective:    BP 132/82 (BP Location: Left Arm, Patient Position: Sitting, Cuff Size: Large)   Pulse 88   Temp 98.9 F (37.2 C) (Oral)   Ht  (1.651 m)   Wt 283 lb (128.4 kg)   LMP 11/07/2012   SpO2 98%   BMI 47.09 kg/m    Physical Exam Vitals signs and nursing note reviewed.  Constitutional:      General: She is not in acute distress.    Appearance: She is obese. She is not ill-appearing.  HENT:     Head: Normocephalic and atraumatic.     Nose: Nose normal.     Mouth/Throat:     Mouth: Mucous membranes are moist.  Eyes:     Extraocular Movements: Extraocular movements intact.  Cardiovascular:     Rate and Rhythm: Normal rate.     Pulses: Normal pulses.  Musculoskeletal: Normal range of motion.  Skin:    General: Skin is warm and dry.  Neurological:     General: No focal deficit present.     Mental Status: She is alert and oriented to person, place, and time.  Psychiatric:        Attention and Perception: Attention normal.        Mood and Affect: Affect is tearful.        Speech: Speech normal.         Behavior: Behavior normal.        Thought Content: Thought content normal.        Cognition and Memory: Cognition and memory normal.        Judgment: Judgment normal.           Assessment & Plan:   Major depression, recurrent, chronic (HCC)  Primary osteoarthritis of both hands No follow-ups on file.

## 2019-01-08 NOTE — Assessment & Plan Note (Addendum)
>  40 minutes spent in face to face time with patient, >50% spent in counselling or coordination of care.  She is very tearful but denies SI or HI.  She is frustrated with her amount of pain and this is causing worsening depression.  Agree that cymbalta may be helpful to treat both her pain (OA) and depression. eRx sent for cymbalta 30 mg daily. Continue her other medications at current doses.  She will update me in a few weeks. The patient indicates understanding of these issues and agrees with the plan.

## 2019-01-08 NOTE — Telephone Encounter (Signed)
Pre-op scheduled for 03/09/2019 at 10:00am. Patient aware. Letter mailed

## 2019-01-12 NOTE — Progress Notes (Signed)
CC'D TO PCP °

## 2019-01-13 NOTE — Progress Notes (Signed)
Spoke with RMR. Agrees with plan for TCS+/-EGD+/-capsule.

## 2019-02-23 ENCOUNTER — Other Ambulatory Visit (HOSPITAL_COMMUNITY): Payer: Self-pay

## 2019-02-23 DIAGNOSIS — D509 Iron deficiency anemia, unspecified: Secondary | ICD-10-CM

## 2019-02-23 DIAGNOSIS — E538 Deficiency of other specified B group vitamins: Secondary | ICD-10-CM

## 2019-02-24 ENCOUNTER — Inpatient Hospital Stay (HOSPITAL_COMMUNITY): Payer: BC Managed Care – PPO | Attending: Hematology

## 2019-02-24 ENCOUNTER — Other Ambulatory Visit: Payer: Self-pay

## 2019-02-24 DIAGNOSIS — E538 Deficiency of other specified B group vitamins: Secondary | ICD-10-CM | POA: Diagnosis present

## 2019-02-24 DIAGNOSIS — D509 Iron deficiency anemia, unspecified: Secondary | ICD-10-CM | POA: Diagnosis not present

## 2019-02-24 LAB — COMPREHENSIVE METABOLIC PANEL
ALT: 22 U/L (ref 0–44)
AST: 17 U/L (ref 15–41)
Albumin: 4 g/dL (ref 3.5–5.0)
Alkaline Phosphatase: 83 U/L (ref 38–126)
Anion gap: 8 (ref 5–15)
BUN: 8 mg/dL (ref 6–20)
CO2: 28 mmol/L (ref 22–32)
Calcium: 8.7 mg/dL — ABNORMAL LOW (ref 8.9–10.3)
Chloride: 103 mmol/L (ref 98–111)
Creatinine, Ser: 0.81 mg/dL (ref 0.44–1.00)
GFR calc Af Amer: >60 mL/min (ref >60)
GFR calc non Af Amer: >60 mL/min (ref >60)
Glucose, Bld: 113 mg/dL — ABNORMAL HIGH (ref 70–99)
Potassium: 4.1 mmol/L (ref 3.5–5.1)
Sodium: 139 mmol/L (ref 135–145)
Total Bilirubin: 0.5 mg/dL (ref 0.3–1.2)
Total Protein: 7.6 g/dL (ref 6.5–8.1)

## 2019-02-24 LAB — CBC WITH DIFFERENTIAL/PLATELET
Abs Immature Granulocytes: 0.03 10*3/uL (ref 0.00–0.07)
Basophils Absolute: 0.1 10*3/uL (ref 0.0–0.1)
Basophils Relative: 1 %
Eosinophils Absolute: 0.4 10*3/uL (ref 0.0–0.5)
Eosinophils Relative: 6 %
HCT: 43.7 % (ref 36.0–46.0)
Hemoglobin: 14.5 g/dL (ref 12.0–15.0)
Immature Granulocytes: 1 %
Lymphocytes Relative: 27 %
Lymphs Abs: 1.8 10*3/uL (ref 0.7–4.0)
MCH: 31 pg (ref 26.0–34.0)
MCHC: 33.2 g/dL (ref 30.0–36.0)
MCV: 93.4 fL (ref 80.0–100.0)
Monocytes Absolute: 0.6 10*3/uL (ref 0.1–1.0)
Monocytes Relative: 9 %
Neutro Abs: 3.7 10*3/uL (ref 1.7–7.7)
Neutrophils Relative %: 56 %
Platelets: 250 10*3/uL (ref 150–400)
RBC: 4.68 MIL/uL (ref 3.87–5.11)
RDW: 12.7 % (ref 11.5–15.5)
WBC: 6.5 10*3/uL (ref 4.0–10.5)
nRBC: 0 % (ref 0.0–0.2)

## 2019-02-24 LAB — LACTATE DEHYDROGENASE: LDH: 138 U/L (ref 98–192)

## 2019-02-24 LAB — FERRITIN: Ferritin: 108 ng/mL (ref 11–307)

## 2019-02-27 ENCOUNTER — Other Ambulatory Visit: Payer: Self-pay

## 2019-02-27 ENCOUNTER — Inpatient Hospital Stay (HOSPITAL_COMMUNITY): Payer: BC Managed Care – PPO | Attending: Hematology | Admitting: Hematology

## 2019-02-27 ENCOUNTER — Encounter (HOSPITAL_COMMUNITY): Payer: Self-pay | Admitting: Hematology

## 2019-02-27 DIAGNOSIS — D509 Iron deficiency anemia, unspecified: Secondary | ICD-10-CM | POA: Diagnosis not present

## 2019-02-27 DIAGNOSIS — E538 Deficiency of other specified B group vitamins: Secondary | ICD-10-CM | POA: Diagnosis not present

## 2019-02-27 NOTE — Progress Notes (Signed)
Virtual Visit via Telephone Note  I connected with [name] on 02/27/19 at  2:00 PM EDT by telephone and verified that I am speaking with the correct person using two identifiers.   I discussed the limitations, risks, security and privacy concerns of performing an evaluation and management service by telephone and the availability of in person appointments. I also discussed with the patient that there may be a patient responsible charge related to this service. The patient expressed understanding and agreed to proceed.   History of Present Illness: #1 iron deficiency anemia, prior work-up for stool for occult blood was negative.  Last Feraheme on 07/08/2018 and 07/22/2018 2.  B12 deficiency 3.  Joint pains   Observations/Objective: She denies any fevers, night sweats or weight loss in the last 4 months.  Denies any hospitalizations.  Denies any bleeding per rectum or melena.  She was evaluated by GI and will have colonoscopy later this year.  Assessment and Plan: I reviewed her blood work today with the patient.  Hemoglobin is 14.5.  Ferritin is stable at 107.  She is feeling very well.  Hence have recommended a follow-up visit in 6 months.  She will keep an appointment with GI for colonoscopy.  She was told to start taking B12 1 mg tablet daily.  I plan to check her B12 and ferritin and iron panel prior to next visit in 6 months.  Follow Up Instructions: RTC 6 months.   I discussed the assessment and treatment plan with the patient. The patient was provided an opportunity to ask questions and all were answered. The patient agreed with the plan and demonstrated an understanding of the instructions.   The patient was advised to call back or seek an in-person evaluation if the symptoms worsen or if the condition fails to improve as anticipated.  I provided 11 minutes of non-face-to-face time during this encounter.   Doreatha Massed, MD

## 2019-03-09 ENCOUNTER — Encounter (HOSPITAL_COMMUNITY): Payer: Self-pay

## 2019-03-09 ENCOUNTER — Encounter (HOSPITAL_COMMUNITY)
Admission: RE | Admit: 2019-03-09 | Discharge: 2019-03-09 | Disposition: A | Discharge status: Home / Self Care | Payer: BC Managed Care – PPO | Source: Ambulatory Visit | Attending: Internal Medicine | Admitting: Internal Medicine

## 2019-03-09 ENCOUNTER — Other Ambulatory Visit: Payer: Self-pay

## 2019-03-10 ENCOUNTER — Telehealth: Payer: Self-pay | Admitting: *Deleted

## 2019-03-10 NOTE — Telephone Encounter (Signed)
Spoke with patient and she is aware she will be receiving a call from endo to arrange COVID-19 Testing and she will need to remain quarantined after the test until after his procedure. She is aware if this is not done then this procedure will be cancelled. She voiced understanding.   Patient also stated she was told her procedure time was moved up per pre-op to 8:30am. Patient wanted to know what was the new times she needs to start drinking prep. I advised her that she would drink 2nd half on Monday at 3:30am and had until 5:30am. Nothing further needed

## 2019-03-11 ENCOUNTER — Encounter: Payer: Self-pay | Admitting: Family Medicine

## 2019-03-12 ENCOUNTER — Other Ambulatory Visit (HOSPITAL_COMMUNITY)
Admission: RE | Admit: 2019-03-12 | Discharge: 2019-03-12 | Disposition: A | Discharge status: Home / Self Care | Payer: BC Managed Care – PPO | Source: Ambulatory Visit | Attending: Internal Medicine | Admitting: Internal Medicine

## 2019-03-12 ENCOUNTER — Other Ambulatory Visit: Payer: Self-pay

## 2019-03-12 ENCOUNTER — Other Ambulatory Visit (HOSPITAL_COMMUNITY): Payer: BC Managed Care – PPO

## 2019-03-12 DIAGNOSIS — Z1159 Encounter for screening for other viral diseases: Secondary | ICD-10-CM | POA: Insufficient documentation

## 2019-03-13 LAB — NOVEL CORONAVIRUS, NAA (HOSP ORDER, SEND-OUT TO REF LAB; TAT 18-24 HRS): SARS-CoV-2, NAA: NOT DETECTED

## 2019-03-16 ENCOUNTER — Ambulatory Visit (HOSPITAL_COMMUNITY): Payer: BC Managed Care – PPO | Admitting: Anesthesiology

## 2019-03-16 ENCOUNTER — Encounter (HOSPITAL_COMMUNITY): Payer: Self-pay | Admitting: *Deleted

## 2019-03-16 ENCOUNTER — Encounter (HOSPITAL_COMMUNITY)
Admission: RE | Disposition: A | Discharge status: Home / Self Care | Payer: Self-pay | Source: Home / Self Care | Attending: Internal Medicine

## 2019-03-16 ENCOUNTER — Ambulatory Visit (HOSPITAL_COMMUNITY)
Admission: RE | Admit: 2019-03-16 | Discharge: 2019-03-16 | Disposition: A | Discharge status: Home / Self Care | Payer: BC Managed Care – PPO | Attending: Internal Medicine | Admitting: Internal Medicine

## 2019-03-16 ENCOUNTER — Other Ambulatory Visit: Payer: Self-pay

## 2019-03-16 DIAGNOSIS — K449 Diaphragmatic hernia without obstruction or gangrene: Secondary | ICD-10-CM

## 2019-03-16 DIAGNOSIS — Z79899 Other long term (current) drug therapy: Secondary | ICD-10-CM | POA: Diagnosis not present

## 2019-03-16 DIAGNOSIS — M1990 Unspecified osteoarthritis, unspecified site: Secondary | ICD-10-CM | POA: Insufficient documentation

## 2019-03-16 DIAGNOSIS — K319 Disease of stomach and duodenum, unspecified: Secondary | ICD-10-CM | POA: Insufficient documentation

## 2019-03-16 DIAGNOSIS — Z9884 Bariatric surgery status: Secondary | ICD-10-CM | POA: Insufficient documentation

## 2019-03-16 DIAGNOSIS — R51 Headache: Secondary | ICD-10-CM | POA: Insufficient documentation

## 2019-03-16 DIAGNOSIS — J45990 Exercise induced bronchospasm: Secondary | ICD-10-CM | POA: Insufficient documentation

## 2019-03-16 DIAGNOSIS — E669 Obesity, unspecified: Secondary | ICD-10-CM | POA: Insufficient documentation

## 2019-03-16 DIAGNOSIS — Z1159 Encounter for screening for other viral diseases: Secondary | ICD-10-CM | POA: Insufficient documentation

## 2019-03-16 DIAGNOSIS — K269 Duodenal ulcer, unspecified as acute or chronic, without hemorrhage or perforation: Secondary | ICD-10-CM

## 2019-03-16 DIAGNOSIS — D509 Iron deficiency anemia, unspecified: Secondary | ICD-10-CM | POA: Diagnosis present

## 2019-03-16 DIAGNOSIS — F329 Major depressive disorder, single episode, unspecified: Secondary | ICD-10-CM | POA: Diagnosis not present

## 2019-03-16 DIAGNOSIS — Z6841 Body Mass Index (BMI) 40.0 and over, adult: Secondary | ICD-10-CM | POA: Diagnosis not present

## 2019-03-16 DIAGNOSIS — K2950 Unspecified chronic gastritis without bleeding: Secondary | ICD-10-CM | POA: Insufficient documentation

## 2019-03-16 HISTORY — PX: GIVENS CAPSULE STUDY: SHX5432

## 2019-03-16 HISTORY — PX: ESOPHAGOGASTRODUODENOSCOPY (EGD) WITH PROPOFOL: SHX5813

## 2019-03-16 HISTORY — PX: BIOPSY: SHX5522

## 2019-03-16 HISTORY — PX: COLONOSCOPY WITH PROPOFOL: SHX5780

## 2019-03-16 SURGERY — COLONOSCOPY WITH PROPOFOL
Anesthesia: Monitor Anesthesia Care

## 2019-03-16 MED ORDER — HYDROMORPHONE HCL 1 MG/ML IJ SOLN: 0.2500 mg | INTRAMUSCULAR | Status: DC | PRN

## 2019-03-16 MED ORDER — MEPERIDINE HCL 50 MG/ML IJ SOLN: 6.2500 mg | INTRAMUSCULAR | Status: DC | PRN

## 2019-03-16 MED ORDER — LACTATED RINGERS IV SOLN: INTRAVENOUS | Status: DC

## 2019-03-16 MED ORDER — PROMETHAZINE HCL 25 MG/ML IJ SOLN: 6.2500 mg | INTRAMUSCULAR | Status: DC | PRN

## 2019-03-16 MED ORDER — PROPOFOL 500 MG/50ML IV EMUL
INTRAVENOUS | Status: DC | PRN
  Administered 2019-03-16: 100 ug/kg/min via INTRAVENOUS
  Administered 2019-03-16: 75 ug/kg/min via INTRAVENOUS

## 2019-03-16 MED ORDER — PROPOFOL 10 MG/ML IV BOLUS
INTRAVENOUS | Status: AC
  Filled 2019-03-16: qty 40

## 2019-03-16 MED ORDER — LACTATED RINGERS IV SOLN
INTRAVENOUS | Status: DC
  Administered 2019-03-16: 1000 mL via INTRAVENOUS

## 2019-03-16 MED ORDER — HYDROCODONE-ACETAMINOPHEN 7.5-325 MG PO TABS: 1.0000 | ORAL_TABLET | Freq: Once | ORAL | Status: DC | PRN

## 2019-03-16 MED ORDER — CHLORHEXIDINE GLUCONATE CLOTH 2 % EX PADS: 6.0000 | MEDICATED_PAD | Freq: Once | CUTANEOUS | Status: DC

## 2019-03-16 MED ORDER — PROPOFOL 10 MG/ML IV BOLUS
INTRAVENOUS | Status: DC | PRN
  Administered 2019-03-16 (×4): 20 mg via INTRAVENOUS
  Administered 2019-03-16: 80 mg via INTRAVENOUS

## 2019-03-16 NOTE — Op Note (Signed)
[facility] Patient Name: [name] Procedure Date: 03/16/2019 8:50 AM MRN: [id number] Date of Birth: 02-Aug-1976 Attending MD: Pam Pac , MD CSN: [id number] Age: 43 Admit Type: Outpatient Procedure:                Colonoscopy Indications:              Iron deficiency anemia Providers:                Pam Pac, MD, Dayton Scrape RN,                            RN, Pandora Leiter, Technician, Burke Keels,                            Technician Referring MD:              Medicines:                Propofol per Anesthesia Complications:            No immediate complications. Estimated Blood Loss:     Estimated blood loss was minimal. Procedure:                Pre-Anesthesia Assessment:                           - Prior to the procedure, a History and Physical                            was performed, and patient medications and                            allergies were reviewed. The patient's tolerance of                            previous anesthesia was also reviewed. The risks                            and benefits of the procedure and the sedation                            options and risks were discussed with the patient.                            All questions were answered, and informed consent                            was obtained. Prior Anticoagulants: The patient has                            taken no previous anticoagulant or antiplatelet                            agents. ASA Grade Assessment: II - A patient with  mild systemic disease. After reviewing the risks                            and benefits, the patient was deemed in                            satisfactory condition to undergo the procedure.                           After obtaining informed consent, the colonoscope                            was passed under direct vision. Throughout the                            procedure, the patient's blood  pressure, pulse, and                            oxygen saturations were monitored continuously. The                            [id number]-[id number] ([id number]) scope was introduced through                            the and advanced to the 15 cm into the ileum. The                            terminal ileum, ileocecal valve, appendiceal                            orifice, and rectum were photographed. Scope In: 9:04:09 AM Scope Out: 9:14:49 AM Scope Withdrawal Time: 0 hours 6 minutes 27 seconds  Total Procedure Duration: 0 hours 10 minutes 40 seconds  Findings:      The perianal and digital rectal examinations were normal.      The colon (entire examined portion) appeared normal.      The exam was otherwise without abnormality on direct and retroflexion       views. Normal distal 15 cm of terminal ileum Impression:               - The entire examined colon is normal. Normal                            terminal ileum.                           - No specimens collected. Colonoscopy in 10 years                            for screening. See EGD report. Moderate Sedation:      Moderate (conscious) sedation was personally administered by an       anesthesia professional. The following parameters were monitored: oxygen       saturation, heart rate, blood pressure, respiratory rate, EKG, adequacy       of pulmonary ventilation, and response to  care. Recommendation:           - Patient has a contact number available for                            emergencies. The signs and symptoms of potential                            delayed complications were discussed with the                            patient. Return to normal activities tomorrow.                            Written discharge instructions were provided to the                            patient.                           - Advance diet as tolerated.                           - Continue present medications.                           - Repeat colonoscopy  in 10 years for screening                            purposes.                           - Return to GI office (date not yet determined). Procedure Code(s):        --- Professional ---                           [id number], Colonoscopy, flexible; diagnostic, including                            collection of specimen(s) by brushing or washing,                            when performed (separate procedure) Diagnosis Code(s):        --- Professional ---                           D50.9, Iron deficiency anemia, unspecified CPT copyright 2019 [employer] Association. All rights reserved. The codes documented in this report are preliminary and upon coder review may  be revised to meet current compliance requirements. Pam Friendsobert M. [name], MD Pam Pacobert Michael Tarris Delbene, MD 03/16/2019 10:02:16 AM This report has been signed electronically. Number of Addenda: 0

## 2019-03-16 NOTE — H&P (View-Only) (Signed)
@LOGO@   Primary Care Physician:  Aron, Talia M, MD Primary Gastroenterologist:  Dr. [name] Pincock  Pre-Procedure History & Physical: HPI:  [name] is a 43 y.o. female here for colonoscopy for iron deficiency anemia.  If colonoscopy is negative EGD will be performed.  If EGD is unremarkable as to cause, we will deploy a small bowel capsule per plan.  Past Medical History:  Diagnosis Date  . Anemia   . Arthritis   . Asthma    EXERCISE INDUCED  . Bursitis of hip   . Depression   . Generalized headaches   . Irregular menses   . Obesity (BMI 30-39.9)     Past Surgical History:  Procedure Laterality Date  . ABDOMINAL HYSTERECTOMY    . ACHILLES TENDON LENGTHENING  1989  . ANKLE GANGLION CYST EXCISION  02/2012   left ankle  . DILATION AND CURETTAGE OF UTERUS  2003  . FEET SURGERY  1990   INSERTION OF BONE GRAFT IN FEET  . LAPAROSCOPIC GASTRIC BANDING  10/17/10   Dr Eric Wilson; [facility] Standard  . LAPAROSCOPIC REPAIR AND REMOVAL OF GASTRIC BAND  01/2018  . LIPOMA EXCISION  02/2006   abdomen    Prior to Admission medications   Medication Sig Start Date End Date Taking? Authorizing Provider  albuterol (PROAIR HFA) 108 (90 BASE) MCG/ACT inhaler Inhale 2 puffs into the lungs every 6 (six) hours as needed for wheezing.   Yes [provider]  buPROPion (WELLBUTRIN XL) 150 MG 24 hr tablet Take 300 mg by mouth daily.    Yes [provider]  busPIRone (BUSPAR) 10 MG tablet Take 10 mg by mouth 2 (two) times daily.  02/25/19  Yes [provider]  butalbital-acetaminophen-caffeine (FIORICET, ESGIC) 50-325-40 MG tablet Take 1 tablet by mouth 2 (two) times daily as needed for headache.   Yes [provider]  diclofenac sodium (VOLTAREN) 1 % GEL 3 grams to 3 large joints up to 3 times daily Patient taking differently: Apply 3 g topically 3 (three) times daily as needed (pain). 3 grams to 3 large joints up to 3 times daily as needed 10/01/18  Yes Deveshwar, Shaili,  MD  DULoxetine (CYMBALTA) 30 MG capsule Take 1 capsule (30 mg total) by mouth daily. 01/08/19  Yes Aron, Talia M, MD  gabapentin (NEURONTIN) 300 MG capsule Take 300 mg by mouth 2 (two) times daily.    Yes [provider]  trolamine salicylate (ASPERCREME) 10 % cream Apply 1 application topically as needed for muscle pain.   Yes [provider]  EPINEPHrine (EPIPEN) 0.3 mg/0.3 mL DEVI Inject 0.3 mLs (0.3 mg total) into the muscle once. 12/10/11   Aron, Talia M, MD    Allergies as of 01/07/2019 - Review Complete 01/07/2019  Allergen Reaction Noted  . 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 Whey Anaphylaxis 10/21/2016  . Augmentin [amoxicillin-pot clavulanate] Diarrhea and Nausea And Vomiting 12/10/2011  . Orange fruit [citrus] Other (See Comments) 12/16/2017  . Milk-related compounds  05/28/2013  . Other  01/28/2018  . Codeine Rash   . Tree extract Itching 05/28/2013    Family History  Problem Relation Age of Onset  . Heart disease Maternal Grandmother        V. Fib arrest  . Hypertension Mother   . Hyperlipidemia Mother   . Diabetes Mother        pre-diabetes  . Hypertension Father   . Hyperlipidemia Father   .   Heart disease Father   . Diabetes Father   . Cancer Father        skin  . Diabetes Sister   . Hypertension Sister   . Cancer Maternal Grandfather        lung and skin  . Colon cancer Maternal Grandfather   . Cancer Paternal Grandfather        stomach and skin  . Pleurisy Son   . Migraines Son   . Colon polyps Other        aunt and uncle    Social History   Socioeconomic History  . Marital status: Married    Spouse name: Not on file  . Number of children: Not on file  . Years of education: Not on file  . Highest education level: Not on file  Occupational History  . Not on file  Social Needs  . Financial resource strain: Not on file  . Food insecurity:    Worry: Not on  file    Inability: Not on file  . Transportation needs:    Medical: Not on file    Non-medical: Not on file  Tobacco Use  . Smoking status: Never Smoker  . Smokeless tobacco: Never Used  Substance and Sexual Activity  . Alcohol use: No  . Drug use: No  . Sexual activity: Yes  Lifestyle  . Physical activity:    Days per week: Not on file    Minutes per session: Not on file  . Stress: Not on file  Relationships  . Social connections:    Talks on phone: Not on file    Gets together: Not on file    Attends religious service: Not on file    Active member of club or organization: Not on file    Attends meetings of clubs or organizations: Not on file    Relationship status: Not on file  . Intimate partner violence:    Fear of current or ex partner: Not on file    Emotionally abused: Not on file    Physically abused: Not on file    Forced sexual activity: Not on file  Other Topics Concern  . Not on file  Social History Narrative  . Not on file    Review of Systems: See HPI, otherwise negative ROS  Physical Exam: BP (!) 143/89   Temp 98.2 F (36.8 C) (Oral)   Resp 18   Ht 5\' 5"  (1.651 m)   Wt 127.5 kg   LMP 11/07/2012   SpO2 94%   BMI 46.76 kg/m  General:   Alert,  Well-developed, well-nourished, pleasant and cooperative in NAD eck:  Supple; no masses or thyromegaly. No significant cervical adenopathy. Lungs:  Clear throughout to auscultation.   No wheezes, crackles, or rhonchi. No acute distress. Heart:  Regular rate and rhythm; no murmurs, clicks, rubs,  or gallops. Abdomen: Non-distended, normal bowel sounds.  Soft and nontender without appreciable mass or hepatosplenomegaly.  Pulses:  Normal pulses noted. Extremities:  Without clubbing or edema.  Impression/Plan: 43 year old lady with iron deficiency anemia Hemoccult negative.  Colonoscopy indicated.  Given circumstances, if colonoscopy is negative, will perform EGD.  If EGD unremarkable go ahead and deploy small  bowel capsule to complete the GI evaluation. The risks, benefits, limitations, imponderables and alternatives regarding both EGD and colonoscopy have been reviewed with the patient. Questions have been answered. All parties agreeable.      Notice: This dictation was prepared with Dragon dictation along with smaller phrase technology.  Any transcriptional errors that result from this process are unintentional and may not be corrected upon review.

## 2019-03-16 NOTE — Discharge Instructions (Signed)
Colonoscopy Discharge Instructions  Read the instructions outlined below and refer to this sheet in the next few weeks. These discharge instructions provide you with general information on caring for yourself after you leave the hospital. Your doctor may also give you specific instructions. While your treatment has been planned according to the most current medical practices available, unavoidable complications occasionally occur. If you have any problems or questions after discharge, call Dr. Gala Romney at [phone]. ACTIVITY  You may resume your regular activity, but move at a slower pace for the next 24 hours.   Take frequent rest periods for the next 24 hours.   Walking will help get rid of the air and reduce the bloated feeling in your belly (abdomen).   No driving for 24 hours (because of the medicine (anesthesia) used during the test).    Do not sign any important legal documents or operate any machinery for 24 hours (because of the anesthesia used during the test).  NUTRITION  Drink plenty of fluids.   You may resume your normal diet as instructed by your doctor.   Begin with a light meal and progress to your normal diet. Heavy or fried foods are harder to digest and may make you feel sick to your stomach (nauseated).   Avoid alcoholic beverages for 24 hours or as instructed.  MEDICATIONS  You may resume your normal medications unless your doctor tells you otherwise.  WHAT YOU CAN EXPECT TODAY  Some feelings of bloating in the abdomen.   Passage of more gas than usual.   Spotting of blood in your stool or on the toilet paper.  IF YOU HAD POLYPS REMOVED DURING THE COLONOSCOPY:  No aspirin products for 7 days or as instructed.   No alcohol for 7 days or as instructed.   Eat a soft diet for the next 24 hours.  FINDING OUT THE RESULTS OF YOUR TEST Not all test results are available during your visit. If your test results are not back during the visit, make an appointment  with your caregiver to find out the results. Do not assume everything is normal if you have not heard from your caregiver or the medical facility. It is important for you to follow up on all of your test results.  SEEK IMMEDIATE MEDICAL ATTENTION IF:  You have more than a spotting of blood in your stool.   Your belly is swollen (abdominal distention).   You are nauseated or vomiting.   You have a temperature over 101.   You have abdominal pain or discomfort that is severe or gets worse throughout the day.    EGD Discharge instructions Please read the instructions outlined below and refer to this sheet in the next few weeks. These discharge instructions provide you with general information on caring for yourself after you leave the hospital. Your doctor may also give you specific instructions. While your treatment has been planned according to the most current medical practices available, unavoidable complications occasionally occur. If you have any problems or questions after discharge, please call your doctor. ACTIVITY  You may resume your regular activity but move at a slower pace for the next 24 hours.   Take frequent rest periods for the next 24 hours.   Walking will help expel (get rid of) the air and reduce the bloated feeling in your abdomen.   No driving for 24 hours (because of the anesthesia (medicine) used during the test).   You may shower.   Do not sign  any important legal documents or operate any machinery for 24 hours (because of the anesthesia used during the test).  NUTRITION  Drink plenty of fluids.   You may resume your normal diet.   Begin with a light meal and progress to your normal diet.   Avoid alcoholic beverages for 24 hours or as instructed by your caregiver.  MEDICATIONS  You may resume your normal medications unless your caregiver tells you otherwise.  WHAT YOU CAN EXPECT TODAY  You may experience abdominal discomfort such as a feeling of  fullness or gas pains.  FOLLOW-UP  Your doctor will discuss the results of your test with you.  SEEK IMMEDIATE MEDICAL ATTENTION IF ANY OF THE FOLLOWING OCCUR:  Excessive nausea (feeling sick to your stomach) and/or vomiting.   Severe abdominal pain and distention (swelling).   Trouble swallowing.   Temperature over 101 F (37.8 C).   Rectal bleeding or vomiting of blood.    Follow written capsule instructions provided by nursing staff  Further recommendations to follow once biopsy report is back and capsule data is available for review  Repeat colonoscopy in 10 years for screening purposes  I called husband, [name], at [phone].  Got voicemail-left impression and recommendations

## 2019-03-16 NOTE — H&P (Addendum)
 @LOGO @   Primary Care Physician:  Dianne DunAron, Talia M, MD Primary Gastroenterologist:  Dr. Jena Gaussourk  Pre-Procedure History & Physical: HPI:  [name] is a 43 y.o. female here for colonoscopy for iron deficiency anemia.  If colonoscopy is negative EGD will be performed.  If EGD is unremarkable as to cause, we will deploy a small bowel capsule per plan.  Past Medical History:  Diagnosis Date  . Anemia   . Arthritis   . Asthma    EXERCISE INDUCED  . Bursitis of hip   . Depression   . Generalized headaches   . Irregular menses   . Obesity (BMI 30-39.9)     Past Surgical History:  Procedure Laterality Date  . ABDOMINAL HYSTERECTOMY    . ACHILLES TENDON LENGTHENING  1989  . ANKLE GANGLION CYST EXCISION  02/2012   left ankle  . DILATION AND CURETTAGE OF UTERUS  2003  . FEET SURGERY  1990   INSERTION OF BONE GRAFT IN FEET  . LAPAROSCOPIC GASTRIC BANDING  10/17/10   Dr Gaynelle AduEric Wilson; [facility] Standard  . LAPAROSCOPIC REPAIR AND REMOVAL OF GASTRIC BAND  01/2018  . LIPOMA EXCISION  02/2006   abdomen    Prior to Admission medications   Medication Sig Start Date End Date Taking? Authorizing Provider  albuterol (PROAIR HFA) 108 (90 BASE) MCG/ACT inhaler Inhale 2 puffs into the lungs every 6 (six) hours as needed for wheezing.   Yes [provider]  buPROPion (WELLBUTRIN XL) 150 MG 24 hr tablet Take 300 mg by mouth daily.    Yes [provider]  busPIRone (BUSPAR) 10 MG tablet Take 10 mg by mouth 2 (two) times daily.  02/25/19  Yes [provider]  butalbital-acetaminophen-caffeine (FIORICET, ESGIC) 50-325-40 MG tablet Take 1 tablet by mouth 2 (two) times daily as needed for headache.   Yes [provider]  diclofenac sodium (VOLTAREN) 1 % GEL 3 grams to 3 large joints up to 3 times daily Patient taking differently: Apply 3 g topically 3 (three) times daily as needed (pain). 3 grams to 3 large joints up to 3 times daily as needed 10/01/18  Yes Deveshwar, Janalyn RouseShaili,  MD  DULoxetine (CYMBALTA) 30 MG capsule Take 1 capsule (30 mg total) by mouth daily. 01/08/19  Yes Dianne DunAron, Talia M, MD  gabapentin (NEURONTIN) 300 MG capsule Take 300 mg by mouth 2 (two) times daily.    Yes [provider]  trolamine salicylate (ASPERCREME) 10 % cream Apply 1 application topically as needed for muscle pain.   Yes [provider]  EPINEPHrine (EPIPEN) 0.3 mg/0.3 mL DEVI Inject 0.3 mLs (0.3 mg total) into the muscle once. 12/10/11   Dianne DunAron, Talia M, MD    Allergies as of 01/07/2019 - Review Complete 01/07/2019  Allergen Reaction Noted  . 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 Whey Anaphylaxis 10/21/2016  . Augmentin [amoxicillin-pot clavulanate] Diarrhea and Nausea And Vomiting 12/10/2011  . Orange fruit [citrus] Other (See Comments) 12/16/2017  . Milk-related compounds  05/28/2013  . Other  01/28/2018  . Codeine Rash   . Tree extract Itching 05/28/2013    Family History  Problem Relation Age of Onset  . Heart disease Maternal Grandmother        V. Fib arrest  . Hypertension Mother   . Hyperlipidemia Mother   . Diabetes Mother        pre-diabetes  . Hypertension Father   . Hyperlipidemia Father   .  Heart disease Father   . Diabetes Father   . Cancer Father        skin  . Diabetes Sister   . Hypertension Sister   . Cancer Maternal Grandfather        lung and skin  . Colon cancer Maternal Grandfather   . Cancer Paternal Grandfather        stomach and skin  . Pleurisy Son   . Migraines Son   . Colon polyps Other        aunt and uncle    Social History   Socioeconomic History  . Marital status: Married    Spouse name: Not on file  . Number of children: Not on file  . Years of education: Not on file  . Highest education level: Not on file  Occupational History  . Not on file  Social Needs  . Financial resource strain: Not on file  . Food insecurity:    Worry: Not on  file    Inability: Not on file  . Transportation needs:    Medical: Not on file    Non-medical: Not on file  Tobacco Use  . Smoking status: Never Smoker  . Smokeless tobacco: Never Used  Substance and Sexual Activity  . Alcohol use: No  . Drug use: No  . Sexual activity: Yes  Lifestyle  . Physical activity:    Days per week: Not on file    Minutes per session: Not on file  . Stress: Not on file  Relationships  . Social connections:    Talks on phone: Not on file    Gets together: Not on file    Attends religious service: Not on file    Active member of club or organization: Not on file    Attends meetings of clubs or organizations: Not on file    Relationship status: Not on file  . Intimate partner violence:    Fear of current or ex partner: Not on file    Emotionally abused: Not on file    Physically abused: Not on file    Forced sexual activity: Not on file  Other Topics Concern  . Not on file  Social History Narrative  . Not on file    Review of Systems: See HPI, otherwise negative ROS  Physical Exam: BP (!) 143/89   Temp 98.2 F (36.8 C) (Oral)   Resp 18   Ht 5\' 5"  (1.651 m)   Wt 127.5 kg   LMP 11/07/2012   SpO2 94%   BMI 46.76 kg/m  General:   Alert,  Well-developed, well-nourished, pleasant and cooperative in NAD eck:  Supple; no masses or thyromegaly. No significant cervical adenopathy. Lungs:  Clear throughout to auscultation.   No wheezes, crackles, or rhonchi. No acute distress. Heart:  Regular rate and rhythm; no murmurs, clicks, rubs,  or gallops. Abdomen: Non-distended, normal bowel sounds.  Soft and nontender without appreciable mass or hepatosplenomegaly.  Pulses:  Normal pulses noted. Extremities:  Without clubbing or edema.  Impression/Plan: 43 year old lady with iron deficiency anemia Hemoccult negative.  Colonoscopy indicated.  Given circumstances, if colonoscopy is negative, will perform EGD.  If EGD unremarkable go ahead and deploy small  bowel capsule to complete the GI evaluation. The risks, benefits, limitations, imponderables and alternatives regarding both EGD and colonoscopy have been reviewed with the patient. Questions have been answered. All parties agreeable.      Notice: This dictation was prepared with Dragon dictation along with smaller phrase technology.  Any transcriptional errors that result from this process are unintentional and may not be corrected upon review.

## 2019-03-16 NOTE — Anesthesia Preprocedure Evaluation (Signed)
Anesthesia Evaluation    Airway Mallampati: II       Dental  (+) Teeth Intact   Pulmonary asthma ,    breath sounds clear to auscultation + decreased breath sounds      Cardiovascular      Neuro/Psych  Headaches, PSYCHIATRIC DISORDERS Depression    GI/Hepatic   Endo/Other    Renal/GU      Musculoskeletal   Abdominal   Peds  Hematology  (+) Blood dyscrasia, anemia ,   Anesthesia Other Findings Asymptomatic h/o anemia Denies DM, Neuro, CV issues Morbid obesity 283#  Reproductive/Obstetrics                             Anesthesia Physical Anesthesia Plan  ASA: III  Anesthesia Plan: MAC   Post-op Pain Management:    Induction:   PONV Risk Score and Plan:   Airway Management Planned:   Additional Equipment:   Intra-op Plan:   Post-operative Plan:   Informed Consent: I have reviewed the patients History and Physical, chart, labs and discussed the procedure including the risks, benefits and alternatives for the proposed anesthesia with the patient or authorized representative who has indicated his/her understanding and acceptance.       Plan Discussed with: Anesthesiologist  Anesthesia Plan Comments:         Anesthesia Quick Evaluation

## 2019-03-16 NOTE — Transfer of Care (Signed)
Immediate Anesthesia Transfer of Care Note  Patient: [name]  Procedure(s) Performed: COLONOSCOPY WITH PROPOFOL (N/A ) ESOPHAGOGASTRODUODENOSCOPY (EGD) WITH PROPOFOL (N/A ) GIVENS CAPSULE STUDY (N/A ) BIOPSY  Patient Location: PACU  Anesthesia Type:MAC  Level of Consciousness: awake, alert  and patient cooperative  Airway & Oxygen Therapy: Patient Spontanous Breathing  Post-op Assessment: Report given to RN and Post -op Vital signs reviewed and stable  Post vital signs: Reviewed and stable  Last Vitals:  Vitals Value Taken Time  BP    Temp    Pulse    Resp    SpO2      Last Pain:  Vitals:   03/16/19 0732  TempSrc: Oral  PainSc: 3       Patients Stated Pain Goal: 7 (32/99/24 2683)  Complications: No apparent anesthesia complications

## 2019-03-16 NOTE — Op Note (Signed)
[facility] Patient Name: [name] Procedure Date: 03/16/2019 8:40 AM MRN: [id number] Date of Birth: [birth year] Attending MD: [name] , MD CSN: [id number] Age: 43 Admit Type: Outpatient Procedure:                Upper GI endoscopy Indications:              Iron deficiency anemia Providers:                [name], MD, Dayton Scrape RN,                            RN, Pandora Leiter Tech., Technician, Burke Keels, Technician Referring MD:              Medicines:                Propofol per Anesthesia Complications:            No immediate complications. Estimated Blood Loss:     Estimated blood loss was minimal. Procedure:                Pre-Anesthesia Assessment:                           - Prior to the procedure, a History and Physical                            was performed, and patient medications and                            allergies were reviewed. The patient's tolerance of                            previous anesthesia was also reviewed. The risks                            and benefits of the procedure and the sedation                            options and risks were discussed with the patient.                            All questions were answered, and informed consent                            was obtained. Prior Anticoagulants: The patient has                            taken no previous anticoagulant or antiplatelet                            agents. ASA Grade Assessment: II - A patient with  mild systemic disease. After reviewing the risks                            and benefits, the patient was deemed in                            satisfactory condition to undergo the procedure.                           After obtaining informed consent, the endoscope was                            passed under direct vision. Throughout the                            procedure, the  patient's blood pressure, pulse, and                            oxygen saturations were monitored continuously. The                            GIF-H190 ([id number]) was introduced through the                            mouth, and advanced to the third part of duodenum.                            The upper GI endoscopy was accomplished without                            difficulty. The patient tolerated the procedure                            well. Scope In: 9:21:46 AM Scope Out: 9:43:06 AM Total Procedure Duration: 0 hours 21 minutes 20 seconds  Findings:      The examined esophagus was normal.      A large hiatal hernia was present. Couple of Sheria Lang lesions present.       Couple of antral erosions. No ulcer or infiltrating process.      A few erosions were found in the duodenal bulb. However, the remainder       of the bulb, second and third portion appeared entirely normal. Scope       withdrawn. Capsule deployment device attached, scope was reintroduced       into the stomach and on into the duodenum. Capsule deployed into the       second portion of the duodenum under endoscopic control. Finally,       biopsies of the eroded gastric mucosa taken for histologic study. Impression:               - Normal esophagus.                           - Large hiatal hernia. Cameron/antral erosions                           -  Duodenal erosions. S/P gastric biopsy and small                            bowel capsule deployment.                           - Moderate Sedation:      Moderate (conscious) sedation was personally administered by an       anesthesia professional. The following parameters were monitored: oxygen       saturation, heart rate, blood pressure, respiratory rate, EKG, adequacy       of pulmonary ventilation, and response to care. Recommendation:           - Patient has a contact number available for                            emergencies. The signs and symptoms of potential                             delayed complications were discussed with the                            patient. Return to normal activities tomorrow.                            Written discharge instructions were provided to the                            patient. Follow capsule instructions as provided by                            nursing staff. Follow-up on pathology. See                            colonoscopy report. Procedure Code(s):        --- Professional ---                           [phone], Esophagogastroduodenoscopy, flexible,                            transoral; diagnostic, including collection of                            specimen(s) by brushing or washing, when performed                            (separate procedure) Diagnosis Code(s):        --- Professional ---                           K44.9, Diaphragmatic hernia without obstruction or                            gangrene  K26.9, Duodenal ulcer, unspecified as acute or                            chronic, without hemorrhage or perforation                           D50.9, Iron deficiency anemia, unspecified CPT copyright 2019 American Medical Association. All rights reserved. The codes documented in this report are preliminary and upon coder review may  be revised to meet current compliance requirements. [name]. Dione Mccombie, MD [name], MD 03/16/2019 10:10:20 AM This report has been signed electronically. Number of Addenda: 0

## 2019-03-16 NOTE — Anesthesia Postprocedure Evaluation (Signed)
Anesthesia Post Note  Patient: [name]  Procedure(s) Performed: COLONOSCOPY WITH PROPOFOL (N/A ) ESOPHAGOGASTRODUODENOSCOPY (EGD) WITH PROPOFOL (N/A ) GIVENS CAPSULE STUDY (N/A ) BIOPSY  Patient location during evaluation: PACU Anesthesia Type: MAC Level of consciousness: awake and alert and patient cooperative Pain management: satisfactory to patient Vital Signs Assessment: post-procedure vital signs reviewed and stable Respiratory status: spontaneous breathing Cardiovascular status: stable Postop Assessment: no apparent nausea or vomiting Anesthetic complications: no     Last Vitals:  Vitals:   03/16/19 1000 03/16/19 1016  BP: (!) 132/59 (!) 147/63  Pulse: 80 72  Resp: 20 18  Temp:  36.6 C  SpO2: 100% 95%    Last Pain:  Vitals:   03/16/19 1016  TempSrc: Oral  PainSc: 0-No pain                 Laryah Neuser

## 2019-03-16 NOTE — Anesthesia Procedure Notes (Signed)
Procedure Name: MAC Date/Time: 03/16/2019 8:55 AM Performed by: Vista Deck, CRNA Pre-anesthesia Checklist: Patient identified, Emergency Drugs available, Suction available, Timeout performed and Patient being monitored Patient Re-evaluated:Patient Re-evaluated prior to induction Oxygen Delivery Method: Nasal Cannula

## 2019-03-17 NOTE — Progress Notes (Deleted)
Office Visit Note  Patient: [name]             Date of Birth: 1976/01/24           MRN: [id number]             PCP: Dianne Dun, MD Referring: Dianne Dun, MD Visit Date: 03/31/2019 Occupation: @GUAROCC @  Subjective:  No chief complaint on file.   History of Present Illness: [name] is a 43 y.o. female ***   Activities of Daily Living:  Patient reports morning stiffness for *** {minute/hour:19697}.   Patient {ACTIONS;DENIES/REPORTS:21021675::"Denies"} nocturnal pain.  Difficulty dressing/grooming: {ACTIONS;DENIES/REPORTS:21021675::"Denies"} Difficulty climbing stairs: {ACTIONS;DENIES/REPORTS:21021675::"Denies"} Difficulty getting out of chair: {ACTIONS;DENIES/REPORTS:21021675::"Denies"} Difficulty using hands for taps, buttons, cutlery, and/or writing: {ACTIONS;DENIES/REPORTS:21021675::"Denies"}  No Rheumatology ROS completed.   PMFS History:  Patient Active Problem List   Diagnosis Date Noted  . Primary osteoarthritis of both knees 09/17/2018  . Primary osteoarthritis of both hands 09/17/2018  . DDD (degenerative disc disease), lumbar 08/27/2018  . Family history of rheumatoid arthritis 08/27/2018  . Flat foot 08/27/2018  . Multiple food allergies 07/28/2018  . Family history of celiac disease 07/28/2018  . Iron deficiency anemia 02/07/2018  . History of removal of laparoscopic gastric banding device 02/03/2018  . Anemia 01/13/2018  . H/O laparoscopic adjustable gastric banding 07/29/2013  . Bilateral ovarian cysts 11/13/2012  . Major depression, recurrent, chronic (HCC) 01/22/2011  . Decreased sex drive 39/12/90  . Morbid obesity (HCC) 02/13/2010  . OTHER MALAISE AND FATIGUE 12/20/2008    Past Medical History:  Diagnosis Date  . Anemia   . Arthritis   . Asthma    EXERCISE INDUCED  . Bursitis of hip   . Depression   . Generalized headaches   . Irregular menses   . Obesity (BMI 30-39.9)     Family History  Problem Relation Age of  Onset  . Heart disease Maternal Grandmother        V. Fib arrest  . Hypertension Mother   . Hyperlipidemia Mother   . Diabetes Mother        pre-diabetes  . Hypertension Father   . Hyperlipidemia Father   . Heart disease Father   . Diabetes Father   . Cancer Father        skin  . Diabetes Sister   . Hypertension Sister   . Cancer Maternal Grandfather        lung and skin  . Colon cancer Maternal Grandfather   . Cancer Paternal Grandfather        stomach and skin  . Pleurisy Son   . Migraines Son   . Colon polyps Other        aunt and uncle   Past Surgical History:  Procedure Laterality Date  . ABDOMINAL HYSTERECTOMY    . ACHILLES TENDON LENGTHENING  1989  . ANKLE GANGLION CYST EXCISION  02/2012   left ankle  . DILATION AND CURETTAGE OF UTERUS  2003  . FEET SURGERY  1990   INSERTION OF BONE GRAFT IN FEET  . LAPAROSCOPIC GASTRIC BANDING  10/17/10   Dr Gaynelle Adu; [facility] Standard  . LAPAROSCOPIC REPAIR AND REMOVAL OF GASTRIC BAND  01/2018  . LIPOMA EXCISION  02/2006   abdomen   Social History   Social History Narrative  . Not on file   Immunization History  Administered Date(s) Administered  . Influenza,inj,quad, With Preservative 08/29/2017  . Tdap 01/13/2018     Objective: Vital Signs: LMP  11/07/2012    Physical Exam   Musculoskeletal Exam: ***  CDAI Exam: CDAI Score: Not documented Patient Global Assessment: Not documented; Provider Global Assessment: Not documented Swollen: Not documented; Tender: Not documented Joint Exam   Not documented   There is currently no information documented on the homunculus. Go to the Rheumatology activity and complete the homunculus joint exam.  Investigation: No additional findings.  Imaging: No results found.  Recent Labs: Lab Results  Component Value Date   WBC 6.5 02/24/2019   HGB 14.5 02/24/2019   PLT 250 02/24/2019   NA 139 02/24/2019   K 4.1 02/24/2019   CL 103 02/24/2019   CO2 28 02/24/2019    GLUCOSE 113 (H) 02/24/2019   BUN 8 02/24/2019   CREATININE 0.81 02/24/2019   BILITOT 0.5 02/24/2019   ALKPHOS 83 02/24/2019   AST 17 02/24/2019   ALT 22 02/24/2019   PROT 7.6 02/24/2019   ALBUMIN 4.0 02/24/2019   CALCIUM 8.7 (L) 02/24/2019   GFRAA >60 02/24/2019    Speciality Comments: No specialty comments available.  Procedures:  No procedures performed Allergies: Cashew nut oil; Cauliflower [brassica oleracea italica]; Whey; Augmentin [amoxicillin-pot clavulanate]; Orange fruit [citrus]; Milk-related compounds; Other; Codeine; and Tree extract   Assessment / Plan:     Visit Diagnoses: No diagnosis found.   Orders: No orders of the defined types were placed in this encounter.  No orders of the defined types were placed in this encounter.   Face-to-face time spent with patient was *** minutes. Greater than 50% of time was spent in counseling and coordination of care.  Follow-Up Instructions: No follow-ups on file.   Ellen HenriMarissa C Daaiel Starlin, CMA  Note - This record has been created using Animal nutritionistDragon software.  Chart creation errors have been sought, but may not always  have been located. Such creation errors do not reflect on  the standard of medical care.

## 2019-03-18 ENCOUNTER — Encounter: Payer: Self-pay | Admitting: Internal Medicine

## 2019-03-18 ENCOUNTER — Other Ambulatory Visit: Payer: Self-pay | Admitting: *Deleted

## 2019-03-18 ENCOUNTER — Telehealth: Payer: Self-pay | Admitting: Gastroenterology

## 2019-03-18 DIAGNOSIS — D509 Iron deficiency anemia, unspecified: Secondary | ICD-10-CM

## 2019-03-18 DIAGNOSIS — K6389 Other specified diseases of intestine: Secondary | ICD-10-CM

## 2019-03-18 NOTE — Telephone Encounter (Signed)
Spoke with patient, discussed results and recommendations.   Images reviewed by both Dr. Jena Gauss and Dr. Karilyn Cota.   On capsule, she had an abnormality noted at 9 minutes 39 seconds, possibly including the ampulla but cannot exclude periampullary adenoma.  3 providers have viewed these images and felt them to be abnormal.  Additionally, she has some minimal fresh blood noted further downstream but no identifiable lesion.  This blood could be explained from some bleeding noted during the procedure such as gastric biopsies.  Potential small nonbleeding small bowel ulcer noted as well.  Recommendations for push enteroscopy with pediatric colonoscope and J view of ampulla with duodenoscope (endo needs to be notified 24 hours in advance to use the scope). PLEASE SCHEDULE with PROPOFOL  Continue to follow with hematology regarding iron deficiency anemia.  Limit or avoid NSAID use.

## 2019-03-18 NOTE — Telephone Encounter (Addendum)
Called patient and she is scheduled for 04/02/2019 at 8:30am. Patient aware endo will call regarding pre-op and if she would need to go for another COVID-19 testing. She voiced understanding. I discussed instructions with her in details. I have also mailed these to her. LMOVM for endo advising of orders

## 2019-03-18 NOTE — Op Note (Signed)
Small Bowel Givens Capsule Study Procedure date: Mar 16, 2019  Referring Provider: Dr. Jena Gauss PCP:  Dr. Dayton Martes, Bryn Gulling, MD  Indication for procedure: Pleasant 43 year old female with history of iron deficiency anemia, requiring iron infusions, initially discovered early 2019. History of gastric band procedure done 2011.  Gastric band removed April 2019 due to evidence of slippage of the band and herniation of the entire gastric pouch above the band causing obstructive symptoms.  14-month history of poor oral intake and weight loss due to vomiting.  Celiac serologies negative.  Hemoccult negative x3.  Previous history of significant Aleve use, rare Motrin use currently.  She underwent EGD and colonoscopy Mar 16, 2019.  Colonoscopy including terminal ileoscopy unremarkable.  She had a large hiatal hernia with a few Cameron/antral erosions.  Few duodenal erosions.  Gastric biopsy benign showing mild inflammation.  No H. pylori.  Patient data:  Wt: 128.7 kg Ht: 5 foot 5 inches  Findings: Capsule deployed at time of endoscopy.  Blood noted initially in the mouth.  Reported to be from anesthesia suctioning, bloody mucus caught up with the deployment device.  First duodenal image noted at 6 minutes 7 seconds.  Around 9 minutes 39 seconds abnormality noted on several images.  A portion appeared to look like the ampulla but the abnormality seen not irreconcilable to ampulla alone.  Cannot exclude periampullary adenoma.  Appears to be in the proximal duodenum.  Further distally around 33 minutes over several images there was noted fresh blood present without identifiable source.  Fresh blood again noted from 39 to 41 minutes without identifiable source.  At 1 hour 5 minutes and 8 seconds, possible nonbleeding small small bowel ulcer.  First Gastric image: N/A First Duodenal image: 6 minutes 7 seconds First Ileo-Cecal Valve image: 2 hours 27 minutes 20 seconds First Cecal image: 3 hours 9 minutes 21  seconds Gastric Passage time: N/A Small Bowel Passage time: 3 hours 3 minutes   Summary & Recommendations: Abnormality noted at 9 minutes 39 seconds, possibly including the ampulla but cannot exclude periampullary adenoma.  Images reviewed by Dr. Jena Gauss and Dr. Karilyn Cota.  Blood noted further downstream could be from upper GI tract related to suctioning/gastric biopsy.  No associated lesion identified.  Potential small nonbleeding small bowel ulcer noted as well.  Recommendations for push enteroscopy with pediatric colonoscope and J view of ampulla with duodenoscope (endo needs to be notified 24 hours in advance to use the scope).  Continue to follow with hematology regarding iron deficiency anemia.  Limit/avoid NSAIDS. States she continues to take Motrin.   Patient aware of findings and recommendations.   [name]. Dixon Boos [facility] [phone] 5/20/20203:52 PM

## 2019-03-19 NOTE — Telephone Encounter (Signed)
Eber Jones also aware regarding scopes needed for procedure and has made notes

## 2019-03-19 NOTE — Telephone Encounter (Signed)
Called endo and spoke w/ Melanie. She is aware of orders placed.

## 2019-03-19 NOTE — Telephone Encounter (Signed)
Spoke with carolyn in endo and she will call patient to schedule pre-op and covid testing 

## 2019-03-26 ENCOUNTER — Encounter (HOSPITAL_COMMUNITY): Payer: Self-pay | Admitting: Internal Medicine

## 2019-03-30 ENCOUNTER — Other Ambulatory Visit (HOSPITAL_COMMUNITY)
Admission: RE | Admit: 2019-03-30 | Discharge: 2019-03-30 | Disposition: A | Discharge status: Home / Self Care | Payer: BC Managed Care – PPO | Source: Ambulatory Visit | Attending: Internal Medicine | Admitting: Internal Medicine

## 2019-03-30 ENCOUNTER — Encounter: Payer: Self-pay | Admitting: Rheumatology

## 2019-03-30 ENCOUNTER — Encounter (HOSPITAL_COMMUNITY): Payer: Self-pay

## 2019-03-30 ENCOUNTER — Encounter (HOSPITAL_COMMUNITY)
Admission: RE | Admit: 2019-03-30 | Discharge: 2019-03-30 | Disposition: A | Discharge status: Home / Self Care | Payer: BC Managed Care – PPO | Source: Ambulatory Visit | Attending: Internal Medicine | Admitting: Internal Medicine

## 2019-03-30 ENCOUNTER — Other Ambulatory Visit: Payer: Self-pay

## 2019-03-30 DIAGNOSIS — Z1159 Encounter for screening for other viral diseases: Secondary | ICD-10-CM | POA: Insufficient documentation

## 2019-03-31 ENCOUNTER — Ambulatory Visit: Payer: Self-pay | Admitting: Rheumatology

## 2019-03-31 LAB — NOVEL CORONAVIRUS, NAA (HOSP ORDER, SEND-OUT TO REF LAB; TAT 18-24 HRS): SARS-CoV-2, NAA: NOT DETECTED

## 2019-04-02 ENCOUNTER — Encounter (HOSPITAL_COMMUNITY)
Admission: RE | Disposition: A | Discharge status: Home / Self Care | Payer: Self-pay | Source: Home / Self Care | Attending: Internal Medicine

## 2019-04-02 ENCOUNTER — Encounter (HOSPITAL_COMMUNITY): Payer: Self-pay

## 2019-04-02 ENCOUNTER — Ambulatory Visit (HOSPITAL_COMMUNITY): Payer: BC Managed Care – PPO | Admitting: Anesthesiology

## 2019-04-02 ENCOUNTER — Ambulatory Visit (HOSPITAL_COMMUNITY)
Admission: RE | Admit: 2019-04-02 | Discharge: 2019-04-02 | Disposition: A | Discharge status: Home / Self Care | Payer: BC Managed Care – PPO | Attending: Internal Medicine | Admitting: Internal Medicine

## 2019-04-02 ENCOUNTER — Telehealth: Payer: Self-pay | Admitting: *Deleted

## 2019-04-02 ENCOUNTER — Other Ambulatory Visit: Payer: Self-pay

## 2019-04-02 DIAGNOSIS — E669 Obesity, unspecified: Secondary | ICD-10-CM | POA: Insufficient documentation

## 2019-04-02 DIAGNOSIS — F329 Major depressive disorder, single episode, unspecified: Secondary | ICD-10-CM | POA: Insufficient documentation

## 2019-04-02 DIAGNOSIS — K6389 Other specified diseases of intestine: Secondary | ICD-10-CM

## 2019-04-02 DIAGNOSIS — Z79899 Other long term (current) drug therapy: Secondary | ICD-10-CM | POA: Insufficient documentation

## 2019-04-02 DIAGNOSIS — D759 Disease of blood and blood-forming organs, unspecified: Secondary | ICD-10-CM | POA: Diagnosis not present

## 2019-04-02 DIAGNOSIS — J45990 Exercise induced bronchospasm: Secondary | ICD-10-CM | POA: Diagnosis not present

## 2019-04-02 DIAGNOSIS — K289 Gastrojejunal ulcer, unspecified as acute or chronic, without hemorrhage or perforation: Secondary | ICD-10-CM | POA: Diagnosis not present

## 2019-04-02 DIAGNOSIS — D509 Iron deficiency anemia, unspecified: Secondary | ICD-10-CM | POA: Insufficient documentation

## 2019-04-02 DIAGNOSIS — Z6841 Body Mass Index (BMI) 40.0 and over, adult: Secondary | ICD-10-CM | POA: Diagnosis not present

## 2019-04-02 DIAGNOSIS — M1990 Unspecified osteoarthritis, unspecified site: Secondary | ICD-10-CM | POA: Diagnosis not present

## 2019-04-02 DIAGNOSIS — K449 Diaphragmatic hernia without obstruction or gangrene: Secondary | ICD-10-CM | POA: Diagnosis not present

## 2019-04-02 HISTORY — PX: ENTEROSCOPY: SHX5533

## 2019-04-02 SURGERY — ENTEROSCOPY
Anesthesia: Monitor Anesthesia Care

## 2019-04-02 MED ORDER — GLYCOPYRROLATE 0.2 MG/ML IJ SOLN
INTRAMUSCULAR | Status: DC | PRN
  Administered 2019-04-02: 0.2 mg via INTRAVENOUS

## 2019-04-02 MED ORDER — LIDOCAINE HCL (CARDIAC) PF 100 MG/5ML IV SOSY
PREFILLED_SYRINGE | INTRAVENOUS | Status: DC | PRN
  Administered 2019-04-02: 40 mg via INTRAVENOUS

## 2019-04-02 MED ORDER — PROPOFOL 500 MG/50ML IV EMUL
INTRAVENOUS | Status: DC | PRN
  Administered 2019-04-02: 09:00:00 via INTRAVENOUS
  Administered 2019-04-02: 150 ug/kg/min via INTRAVENOUS
  Administered 2019-04-02: 09:00:00 via INTRAVENOUS

## 2019-04-02 MED ORDER — KETAMINE HCL 10 MG/ML IJ SOLN
INTRAMUSCULAR | Status: DC | PRN
  Administered 2019-04-02: 5 mg via INTRAVENOUS
  Administered 2019-04-02: 10 mg via INTRAVENOUS
  Administered 2019-04-02: 5 mg via INTRAVENOUS

## 2019-04-02 MED ORDER — HYDROMORPHONE HCL 1 MG/ML IJ SOLN: 0.2500 mg | INTRAMUSCULAR | Status: DC | PRN

## 2019-04-02 MED ORDER — MEPERIDINE HCL 50 MG/ML IJ SOLN: 6.2500 mg | INTRAMUSCULAR | Status: DC | PRN

## 2019-04-02 MED ORDER — PROMETHAZINE HCL 25 MG/ML IJ SOLN: 6.2500 mg | INTRAMUSCULAR | Status: DC | PRN

## 2019-04-02 MED ORDER — PROPOFOL 10 MG/ML IV BOLUS
INTRAVENOUS | Status: AC
  Filled 2019-04-02: qty 60

## 2019-04-02 MED ORDER — LIDOCAINE 2% (20 MG/ML) 5 ML SYRINGE
INTRAMUSCULAR | Status: AC
  Filled 2019-04-02: qty 15

## 2019-04-02 MED ORDER — HYDROCODONE-ACETAMINOPHEN 7.5-325 MG PO TABS: 1.0000 | ORAL_TABLET | Freq: Once | ORAL | Status: DC | PRN

## 2019-04-02 MED ORDER — KETAMINE HCL 50 MG/5ML IJ SOSY
PREFILLED_SYRINGE | INTRAMUSCULAR | Status: AC
  Filled 2019-04-02: qty 5

## 2019-04-02 MED ORDER — GLYCOPYRROLATE PF 0.2 MG/ML IJ SOSY
PREFILLED_SYRINGE | INTRAMUSCULAR | Status: AC
  Filled 2019-04-02: qty 3

## 2019-04-02 MED ORDER — CHLORHEXIDINE GLUCONATE CLOTH 2 % EX PADS: 6.0000 | MEDICATED_PAD | Freq: Once | CUTANEOUS | Status: DC

## 2019-04-02 MED ORDER — LACTATED RINGERS IV SOLN: INTRAVENOUS | Status: DC

## 2019-04-02 MED ORDER — PROPOFOL 10 MG/ML IV BOLUS
INTRAVENOUS | Status: AC
  Filled 2019-04-02: qty 40

## 2019-04-02 MED ORDER — LACTATED RINGERS IV SOLN
INTRAVENOUS | Status: DC
  Administered 2019-04-02: 08:00:00 via INTRAVENOUS

## 2019-04-02 MED ORDER — PROPOFOL 10 MG/ML IV BOLUS
INTRAVENOUS | Status: DC | PRN
  Administered 2019-04-02: 20 mg via INTRAVENOUS

## 2019-04-02 NOTE — Interval H&P Note (Signed)
History and Physical Interval Note:  04/02/2019 7:54 AM  [name]  has presented today for surgery, with the diagnosis of IDA, small bowel mass.  The various methods of treatment have been discussed with the patient and family. After consideration of risks, benefits and other options for treatment, the patient has consented to  Procedure(s) with comments: ENTEROSCOPY WITH PROPOFOL (N/A) - 8:30am push enteroscopy with pediatric colonoscopy and J view of ampulla with duodenoscope as a surgical intervention.  The patient's history has been reviewed, patient examined, no change in status, stable for surgery.  I have reviewed the patient's chart and labs.  Questions were answered to the patient's satisfaction.     [name]  No change.  EGD/enteroscopy today per plan.  The risks, benefits, limitations, alternatives and imponderables have been reviewed with the patient. Potential for esophageal dilation, biopsy, etc. have also been reviewed.  Questions have been answered. All parties agreeable.

## 2019-04-02 NOTE — Anesthesia Procedure Notes (Signed)
Procedure Name: [place] Performed by: Andree Elk Raynetta Osterloh A, CRNA Pre-anesthesia Checklist: Patient identified, Emergency Drugs available, Suction available, Timeout performed and Patient being monitored Patient Re-evaluated:Patient Re-evaluated prior to induction Oxygen Delivery Method: Nasal Cannula

## 2019-04-02 NOTE — Discharge Instructions (Signed)
EGD Discharge instructions Please read the instructions outlined below and refer to this sheet in the next few weeks. These discharge instructions provide you with general information on caring for yourself after you leave the hospital. Your doctor may also give you specific instructions. While your treatment has been planned according to the most current medical practices available, unavoidable complications occasionally occur. If you have any problems or questions after discharge, please call your doctor. ACTIVITY  You may resume your regular activity but move at a slower pace for the next 24 hours.   Take frequent rest periods for the next 24 hours.   Walking will help expel (get rid of) the air and reduce the bloated feeling in your abdomen.   No driving for 24 hours (because of the anesthesia (medicine) used during the test).   You may shower.   Do not sign any important legal documents or operate any machinery for 24 hours (because of the anesthesia used during the test).  NUTRITION  Drink plenty of fluids.   You may resume your normal diet.   Begin with a light meal and progress to your normal diet.   Avoid alcoholic beverages for 24 hours or as instructed by your caregiver.  MEDICATIONS  You may resume your normal medications unless your caregiver tells you otherwise.  WHAT YOU CAN EXPECT TODAY  You may experience abdominal discomfort such as a feeling of fullness or gas pains.  FOLLOW-UP  Your doctor will discuss the results of your test with you.  SEEK IMMEDIATE MEDICAL ATTENTION IF ANY OF THE FOLLOWING OCCUR:  Excessive nausea (feeling sick to your stomach) and/or vomiting.   Severe abdominal pain and distention (swelling).   Trouble swallowing.   Temperature over 101 F (37.8 C).   Rectal bleeding or vomiting of blood.    H. pylori stool antigen testing  Continue to avoid all forms of aspirin and nonsteroidal agents like ibuprofen and Aleve.  CBC and  office visit with [place] in 6 weeks  PATIENT INSTRUCTIONS POST-ANESTHESIA  IMMEDIATELY FOLLOWING SURGERY:  Do not drive or operate machinery for the first twenty four hours after surgery.  Do not make any important decisions for twenty four hours after surgery or while taking narcotic pain medications or sedatives.  If you develop intractable nausea and vomiting or a severe headache please notify your doctor immediately.  FOLLOW-UP:  Please make an appointment with your surgeon as instructed. You do not need to follow up with anesthesia unless specifically instructed to do so.  WOUND CARE INSTRUCTIONS (if applicable):  Keep a dry clean dressing on the anesthesia/puncture wound site if there is drainage.  Once the wound has quit draining you may leave it open to air.  Generally you should leave the bandage intact for twenty four hours unless there is drainage.  If the epidural site drains for more than 36-48 hours please call the anesthesia department.  QUESTIONS?:  Please feel free to call your physician or the hospital operator if you have any questions, and they will be happy to assist you.

## 2019-04-02 NOTE — Op Note (Signed)
[name] Patient Name: [name] Procedure Date: 04/02/2019 8:28 AM MRN: [id number] Date of Birth: [birth year] Attending MD: Gennette Pacobert Michael Ellwyn Ergle , MD CSN: [id number] Age: 43 Admit Type: Outpatient Procedure:                Small bowel enteroscopy Indications:              abnormal capsule study                           Iron deficiency anemia Providers:                Gennette Pacobert Michael Lamorris Knoblock, MD, Jannett CelestineAnitra Bell, RN, Pandora LeiterNeville                            David, Technician, Burke Keelsrisann Tilley, Technician Referring MD:              Medicines:                Propofol per Anesthesia Complications:            No immediate complications. Estimated Blood Loss:     Estimated blood loss: none. Procedure:                Pre-Anesthesia Assessment:                           - Prior to the procedure, a History and Physical                            was performed, and patient medications and                            allergies were reviewed. The patient's tolerance of                            previous anesthesia was also reviewed. The risks                            and benefits of the procedure and the sedation                            options and risks were discussed with the patient.                            All questions were answered, and informed consent                            was obtained. Prior Anticoagulants: The patient has                            taken no previous anticoagulant or antiplatelet                            agents. ASA Grade Assessment: II - A patient with  mild systemic disease. After reviewing the risks                            and benefits, the patient was deemed in                            satisfactory condition to undergo the procedure.                           After obtaining informed consent, the endoscope was                            passed under direct vision. Throughout the                            procedure, the  patient's blood pressure, pulse, and                            oxygen saturations were monitored continuously. The                            [id number]-[id number] ([id number]) scope was introduced through                            the mouth and advanced to the proximal jejunum. The                            small bowel enteroscopy was accomplished without                            difficulty. The patient tolerated the procedure                            well. Scope In: 8:37:11 AM Scope Out: 9:09:03 AM Total Procedure Duration: 0 hours 31 minutes 52 seconds  Findings:      Normal esophagus. Small hiatal hernia. Pediatric colonoscope advanced       well into the jejunum. In the proximal jejunum there appeared to 3 areas       of ulceration (see photos). There are multiple erosions in the posterior       bulb and the second portion of the duodenum as well. I did not see a       polyp or tumor. Pediatric colonoscope exchanged for the duodenoscope.       Good examination of the ampulla performed. No ampullary neoplasm seen.       Attempted to biopsy the jejunal lesions, however, the scope fell back       and I was not able to reidentify the ulcers documented .      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 The largest lesion       was 7 mm in largest dimension. Impression:               -Mild hiatal hernia. Bulbar erosions and jejunal  ulceration -as described Moderate Sedation:      Moderate (conscious) sedation was personally administered by an       anesthesia professional. The following parameters were monitored: oxygen       saturation, heart rate, blood pressure, respiratory rate, EKG, adequacy       of pulmonary ventilation, and response to care. Recommendation:           Avoid nonsteroidal agents. H. pylori stool antigen                            testing. DVC and office follow-up in 6 weeks. At                            patient's request, I have discussed  findings                            recommendations with [name] at [phone] Procedure Code(s):        --- Professional ---                           [phone], Small intestinal endoscopy, enteroscopy                            beyond second portion of duodenum, not including                            ileum; diagnostic, including collection of                            specimen(s) by brushing or washing, when performed                            (separate procedure) Diagnosis Code(s):        --- Professional ---                           D50.9, Iron deficiency anemia, unspecified CPT copyright 2019 [employer] Association. All rights reserved. The codes documented in this report are preliminary and upon coder review may  be revised to meet current compliance requirements. [name]. [name], MD [name], MD 04/02/2019 9:47:32 AM This report has been signed electronically. Number of Addenda: 0

## 2019-04-02 NOTE — Transfer of Care (Signed)
Immediate Anesthesia Transfer of Care Note  Patient: [name]  Procedure(s) Performed: ENTEROSCOPY WITH PROPOFOL (N/A )  Patient Location: PACU  Anesthesia Type:MAC  Level of Consciousness: awake, alert , oriented and patient cooperative  Airway & Oxygen Therapy: Patient Spontanous Breathing  Post-op Assessment: Report given to RN and Post -op Vital signs reviewed and stable  Post vital signs: Reviewed and stable  Last Vitals:  Vitals Value Taken Time  BP 122/55 04/02/2019  9:15 AM  Temp    Pulse 100 04/02/2019  9:18 AM  Resp 16 04/02/2019  9:18 AM  SpO2 95 % 04/02/2019  9:18 AM  Vitals shown include unvalidated device data.  Last Pain:  Vitals:   04/02/19 0838  TempSrc:   PainSc: 2          Complications: No apparent anesthesia complications

## 2019-04-02 NOTE — Telephone Encounter (Signed)
Day Surgery called and scheduled pt for a 6 week follow-up.  Pt needs to have CBC prior to ov.  Can we put in the order please?

## 2019-04-02 NOTE — Anesthesia Preprocedure Evaluation (Signed)
Anesthesia Evaluation    Airway Mallampati: II       Dental  (+) Teeth Intact   Pulmonary asthma ,     + decreased breath sounds      Cardiovascular  Rhythm:regular     Neuro/Psych  Headaches, PSYCHIATRIC DISORDERS Depression    GI/Hepatic   Endo/Other    Renal/GU      Musculoskeletal   Abdominal   Peds  Hematology  (+) Blood dyscrasia, anemia ,   Anesthesia Other Findings Morbid obesity 5'4" 282# today Decreased breath sounds (obesity) Denies interval change to health since endoscopy three weeks ago States strict npo p mn  Reproductive/Obstetrics                             Anesthesia Physical Anesthesia Plan  ASA: III  Anesthesia Plan: MAC   Post-op Pain Management:    Induction:   PONV Risk Score and Plan:   Airway Management Planned:   Additional Equipment:   Intra-op Plan:   Post-operative Plan:   Informed Consent: I have reviewed the patients History and Physical, chart, labs and discussed the procedure including the risks, benefits and alternatives for the proposed anesthesia with the patient or authorized representative who has indicated his/her understanding and acceptance.       Plan Discussed with: Anesthesiologist  Anesthesia Plan Comments:         Anesthesia Quick Evaluation

## 2019-04-02 NOTE — Anesthesia Postprocedure Evaluation (Signed)
Anesthesia Post Note  Patient: [name]  Procedure(s) Performed: ENTEROSCOPY WITH PROPOFOL (N/A )  Patient location during evaluation: PACU Anesthesia Type: MAC Level of consciousness: awake and alert and oriented Pain management: pain level controlled Vital Signs Assessment: post-procedure vital signs reviewed and stable Respiratory status: spontaneous breathing Cardiovascular status: stable : Some nausea  Anesthetic complications: no     Last Vitals:  Vitals:   04/02/19 0757 04/02/19 0915  BP: (!) 141/85 (!) 122/55  Pulse:  99  Resp:  (!) 24  Temp:  (P) 36.6 C  SpO2:  92%    Last Pain:  Vitals:   04/02/19 0838  TempSrc:   PainSc: 2                  ADAMS, AMY A

## 2019-04-03 ENCOUNTER — Other Ambulatory Visit (HOSPITAL_COMMUNITY)
Admission: AD | Admit: 2019-04-03 | Discharge: 2019-04-03 | Disposition: A | Discharge status: Home / Self Care | Payer: BC Managed Care – PPO | Source: Skilled Nursing Facility | Attending: Family Medicine | Admitting: Family Medicine

## 2019-04-03 DIAGNOSIS — R933 Abnormal findings on diagnostic imaging of other parts of digestive tract: Secondary | ICD-10-CM | POA: Insufficient documentation

## 2019-04-03 DIAGNOSIS — R110 Nausea: Secondary | ICD-10-CM | POA: Insufficient documentation

## 2019-04-03 DIAGNOSIS — D509 Iron deficiency anemia, unspecified: Secondary | ICD-10-CM | POA: Diagnosis present

## 2019-04-05 LAB — H. PYLORI ANTIGEN, STOOL: H. Pylori Stool Ag, Eia: NEGATIVE

## 2019-04-06 ENCOUNTER — Telehealth: Payer: Self-pay

## 2019-04-06 NOTE — Telephone Encounter (Signed)
Pt also needs H pylori stool antigen per RMR.

## 2019-04-06 NOTE — Telephone Encounter (Signed)
-----   Message from Daneil Dolin, MD sent at 04/06/2019  9:32 AM EDT ----- CBC and H. pylori stool antigen done ----- Message ----- From: Claudina Lick, LPN Sent: 07/04/7892   9:49 AM EDT To: Daneil Dolin, MD  Good morning Dr.Rourk,  Day surgery called and talked to Angie to schedule this pt a follow up and said she also needed a cbc at that time.    I looked at your AVS and it also says hpylori stool antigen, she didn't tell Angie that you wanted this too. So I wanted to clarify that the pt needs a cbc and hpylori stool? Or just a cbc?

## 2019-04-07 ENCOUNTER — Other Ambulatory Visit: Payer: Self-pay

## 2019-04-07 DIAGNOSIS — D509 Iron deficiency anemia, unspecified: Secondary | ICD-10-CM

## 2019-04-07 NOTE — Telephone Encounter (Signed)
Cbc order placed, released and mailed to pt. H Pylori stool culture has resulted.

## 2019-04-08 DIAGNOSIS — F32A Depression, unspecified: Secondary | ICD-10-CM | POA: Insufficient documentation

## 2019-04-08 DIAGNOSIS — M797 Fibromyalgia: Secondary | ICD-10-CM | POA: Insufficient documentation

## 2019-04-08 DIAGNOSIS — F329 Major depressive disorder, single episode, unspecified: Secondary | ICD-10-CM | POA: Insufficient documentation

## 2019-04-08 NOTE — Progress Notes (Signed)
Virtual Visit via Video   Due to the COVID-19 pandemic, this visit was completed with telemedicine (audio/video) technology to reduce patient and provider exposure as well as to preserve personal protective equipment.   I connected with [name] by a video enabled telemedicine application and verified that I am speaking with the correct person using two identifiers. Location patient: Home Location provider: [facility], Office Persons participating in the virtual visit: [name], Pam Mannanalia Tyja Gortney, MD   I discussed the limitations of evaluation and management by telemedicine and the availability of in person appointments. The patient expressed understanding and agreed to proceed.  Care Team   Patient Care Team: [name], Pam Shepperson M, MD as PCP - General (Family Medicine) [name], Pam L, MD as Consulting Physician (Gastroenterology)  Subjective:   HPI:   Last saw patient for anxiety and depression on 01/08/19.  Note reviewed. At that time, her symptoms had worsened and she had recently been diagnosed with fibromyalgia by a rheumatologist. She was taking Gabapentin 300 mg twice daily - three times daily, Wellbutrin 300 mg XL daily and buspar 5 mg twice daily.  She was very tearful at the 01/08/19 OV but denied any SI or HI.  She was more frustrated that her pain could be contributing to her depression.  We therefore added Cymbalta 30 mg daily to her other rxs.  She states that she feels much better on Cymbalta. Pain is better, depression symptoms are so much better.  GAD 7 : Generalized Anxiety Score 01/08/2019  Nervous, Anxious, on Edge 3  Control/stop worrying 2  Worry too much - different things 1  Trouble relaxing 2  Restless 0  Easily annoyed or irritable 3  Afraid - awful might happen 0  Total GAD 7 Score 11  Anxiety Difficulty Very difficult    [facility] 04/09/2019 01/08/2019  Decreased Interest 1 3  Down, Depressed, Hopeless 1 3  PHQ - 2 Score 2 6   Altered sleeping 2 2  Tired, decreased energy 1 3  Change in appetite 2 3  Feeling bad or failure about yourself  1 3  Trouble concentrating 0 2  Moving slowly or fidgety/restless 0 1  Suicidal thoughts 0 0  PHQ-9 Score 8 20  Difficult doing work/chores Not difficult at all Very difficult    Review of Systems  Constitutional: Negative.   Musculoskeletal: Negative.   Psychiatric/Behavioral: Negative.   All other systems reviewed and are negative.    Patient Active Problem List   Diagnosis Date Noted  . Anxiety and depression 04/08/2019  . Fibromyalgia 04/08/2019  . Primary osteoarthritis of both knees 09/17/2018  . Primary osteoarthritis of both hands 09/17/2018  . DDD (degenerative disc disease), lumbar 08/27/2018  . Family history of rheumatoid arthritis 08/27/2018  . Flat foot 08/27/2018  . Multiple food allergies 07/28/2018  . Family history of celiac disease 07/28/2018  . Iron deficiency anemia 02/07/2018  . History of removal of laparoscopic gastric banding device 02/03/2018  . Anemia 01/13/2018  . H/O laparoscopic adjustable gastric banding 07/29/2013  . Bilateral ovarian cysts 11/13/2012  . Major depression, recurrent, chronic (HCC) 01/22/2011  . Decreased sex drive 14/78/295603/26/2012  . Morbid obesity (HCC) 02/13/2010  . OTHER MALAISE AND FATIGUE 12/20/2008    Social History   Tobacco Use  . Smoking status: Never Smoker  . Smokeless tobacco: Never Used  Substance Use Topics  . Alcohol use: No    Current Outpatient Medications:  .  albuterol (PROAIR  HFA) 108 (90 BASE) MCG/ACT inhaler, Inhale 2 puffs into the lungs every 6 (six) hours as needed for wheezing., Disp: , Rfl:  .  buPROPion (WELLBUTRIN XL) 150 MG 24 hr tablet, Take 2 tablets (300 mg total) by mouth daily., Disp: 60 tablet, Rfl: 3 .  busPIRone (BUSPAR) 10 MG tablet, Take 10 mg by mouth 2 (two) times daily. , Disp: , Rfl:  .  butalbital-acetaminophen-caffeine (FIORICET, ESGIC) 50-325-40 MG tablet, Take 1  tablet by mouth 2 (two) times daily as needed for headache., Disp: , Rfl:  .  diclofenac sodium (VOLTAREN) 1 % GEL, 3 grams to 3 large joints up to 3 times daily (Patient taking differently: Apply 3 g topically 3 (three) times daily as needed (pain). 3 grams to 3 large joints up to 3 times daily as needed), Disp: 3 Tube, Rfl: 3 .  DULoxetine (CYMBALTA) 30 MG capsule, Take 1 capsule (30 mg total) by mouth daily., Disp: 30 capsule, Rfl: 3 .  EPINEPHrine (EPIPEN) 0.3 mg/0.3 mL DEVI, Inject 0.3 mLs (0.3 mg total) into the muscle once. (Patient taking differently: Inject 0.3 mg into the muscle as needed (anaphylaxis). ), Disp: 1 Device, Rfl: 0 .  gabapentin (NEURONTIN) 300 MG capsule, Take 1 capsule (300 mg total) by mouth 2 (two) times daily., Disp: 60 capsule, Rfl: 3 .  trolamine salicylate (ASPERCREME) 10 % cream, Apply 1 application topically as needed for muscle pain., Disp: , Rfl:   Allergies  Allergen Reactions  . Cashew Nut Oil Anaphylaxis  . Cauliflower [Brassica Oleracea Italica] Anaphylaxis  . Whey Anaphylaxis  . Augmentin [Amoxicillin-Pot Clavulanate] Diarrhea and Nausea And Vomiting    Has patient had a PCN reaction causing immediate rash, facial/tongue/throat swelling, SOB or lightheadedness with hypotension: No Has patient had a PCN reaction causing severe rash involving mucus membranes or skin necrosis: No Has patient had a PCN reaction that required hospitalization: No Has patient had a PCN reaction occurring within the last 10 years: Unknown If all of the above answers are "NO", then may proceed with Cephalosporin use.   Haig Prophet Fruit [Citrus] Other (See Comments)    Migraines   . Milk-Related Compounds   . Other     epidural steroid injection - severe headaches, high fever  . Codeine Rash  . Tree Extract Itching    Tee tree oil    Objective:  Wt 281 lb (127.5 kg)   LMP 11/07/2012   BMI 46.76 kg/Nguyen   VITALS: Per patient if applicable, see vitals. GENERAL: Alert,  appears well and in no acute distress. HEENT: Atraumatic, conjunctiva clear, no obvious abnormalities on inspection of external nose and ears. NECK: Normal movements of the head and neck. CARDIOPULMONARY: No increased WOB. Speaking in clear sentences. I:E ratio WNL.  MS: Moves all visible extremities without noticeable abnormality. PSYCH: Pleasant and cooperative, well-groomed. Speech normal rate and rhythm. Affect is appropriate. Insight and judgement are appropriate. Attention is focused, linear, and appropriate.  NEURO: CN grossly intact. Oriented as arrived to appointment on time with no prompting. Moves both UE equally.  SKIN: No obvious lesions, wounds, erythema, or cyanosis noted on face or hands.  Depression screen [facility] 2/9 04/09/2019 01/08/2019  Decreased Interest 1 3  Down, Depressed, Hopeless 1 3  PHQ - 2 Score 2 6  Altered sleeping 2 2  Tired, decreased energy 1 3  Change in appetite 2 3  Feeling bad or failure about yourself  1 3  Trouble concentrating 0 2  Moving slowly or  fidgety/restless 0 1  Suicidal thoughts 0 0  PHQ-9 Score 8 20  Difficult doing work/chores Not difficult at all Very difficult    Assessment and Plan:   [name] was seen today for follow-up.  Diagnoses and all orders for this visit:  Morbid obesity (HCC)  Anxiety and depression  Fibromyalgia  Major depression, recurrent, chronic (HCC)  Other orders -     DULoxetine (CYMBALTA) 30 MG capsule; Take 1 capsule (30 mg total) by mouth daily. -     buPROPion (WELLBUTRIN XL) 150 MG 24 hr tablet; Take 2 tablets (300 mg total) by mouth daily. -     gabapentin (NEURONTIN) 300 MG capsule; Take 1 capsule (300 mg total) by mouth 2 (two) times daily.    Marland Kitchen. COVID-19 Education: The signs and symptoms of COVID-19 were discussed with the patient and how to seek care for testing if needed. The importance of social distancing was discussed today. . Reviewed expectations re: course of current medical issues. .  Discussed self-management of symptoms. . Outlined signs and symptoms indicating need for more acute intervention. . Patient verbalized understanding and all questions were answered. Marland Kitchen. Health Maintenance issues including appropriate healthy diet, exercise, and smoking avoidance were discussed with patient. . See orders for this visit as documented in the electronic medical record.  Pam Mannanalia Nathanal Hermiz, MD  Records requested if needed. Time spent: 25 minutes, of which >50% was spent in obtaining information about her symptoms, reviewing her previous labs, evaluations, and treatments, counseling her about her condition (please see the discussed topics above), and developing a plan to further investigate it; she had a number of questions which I addressed.

## 2019-04-09 ENCOUNTER — Ambulatory Visit (INDEPENDENT_AMBULATORY_CARE_PROVIDER_SITE_OTHER): Payer: BC Managed Care – PPO | Admitting: Family Medicine

## 2019-04-09 ENCOUNTER — Ambulatory Visit: Payer: BC Managed Care – PPO | Admitting: Family Medicine

## 2019-04-09 ENCOUNTER — Encounter: Payer: Self-pay | Admitting: Family Medicine

## 2019-04-09 DIAGNOSIS — F339 Major depressive disorder, recurrent, unspecified: Secondary | ICD-10-CM | POA: Diagnosis not present

## 2019-04-09 DIAGNOSIS — M797 Fibromyalgia: Secondary | ICD-10-CM

## 2019-04-09 DIAGNOSIS — F329 Major depressive disorder, single episode, unspecified: Secondary | ICD-10-CM

## 2019-04-09 DIAGNOSIS — F419 Anxiety disorder, unspecified: Secondary | ICD-10-CM | POA: Diagnosis not present

## 2019-04-09 DIAGNOSIS — F32A Depression, unspecified: Secondary | ICD-10-CM

## 2019-04-09 MED ORDER — GABAPENTIN 300 MG PO CAPS: 300.0000 mg | ORAL_CAPSULE | Freq: Two times a day (BID) | ORAL | 3 refills | Status: DC

## 2019-04-09 MED ORDER — BUPROPION HCL ER (XL) 150 MG PO TB24: 300.0000 mg | ORAL_TABLET | Freq: Every day | ORAL | 3 refills | Status: DC

## 2019-04-09 MED ORDER — DULOXETINE HCL 30 MG PO CPEP: 30.0000 mg | ORAL_CAPSULE | Freq: Every day | ORAL | 3 refills | Status: DC

## 2019-04-09 NOTE — Assessment & Plan Note (Signed)
>  15 minutes spent in face to face time with patient, >50% spent in counselling or coordination of care discussing anxiety and fibromyalgia. She is doing much better from a pain and depression standpoint.  Will continue cymbalta at current dose. She will continue to keep me updated.

## 2019-04-10 ENCOUNTER — Encounter (HOSPITAL_COMMUNITY): Payer: Self-pay | Admitting: Internal Medicine

## 2019-04-14 ENCOUNTER — Other Ambulatory Visit: Payer: Self-pay | Admitting: Neurosurgery

## 2019-04-27 ENCOUNTER — Other Ambulatory Visit: Payer: Self-pay | Admitting: Neurosurgery

## 2019-05-05 NOTE — Progress Notes (Signed)
[id number] - [place], [place] - [address] [place] [id number] Phone: [phone] Fax: [phone]      Your procedure is scheduled on July 13th.  Report to [facility] Entrance "A" at 5:30 A.M., and check in at the Admitting office.  Call this number if you have problems the morning of surgery:  [phone]  Call [phone] if you have any questions prior to your surgery date Monday-Friday 8am-4pm    Remember:  Do not eat or drink after midnight the night before your surgery    Take these medicines the morning of surgery with A SIP OF WATER   Tylenol - if needed  Albuterol Inhaler - if needed  Bupropion (Wellbutrin) - if needed  Buspirone (Buspar)  Epi-Pen - if needed  Gabapentin (Neurontin)  7 days prior to surgery STOP taking any Aspirin (unless otherwise instructed by your surgeon), Aleve, Naproxen, Ibuprofen, Motrin, Advil, Goody's, BC's, all herbal medications, fish oil, and all vitamins.    The Morning of Surgery  Do not wear jewelry, make-up or nail polish.  Do not wear lotions, powders, or perfumes/colognes, or deodorant  Do not shave 48 hours prior to surgery.    Do not bring valuables to the hospital.  [facility] is not responsible for any belongings or valuables.  If you are a smoker, DO NOT Smoke 24 hours prior to surgery IF you wear a CPAP at night please bring your mask, tubing, and machine the morning of surgery   Remember that you must have someone to transport you home after your surgery, and remain with you for 24 hours if you are discharged the same day.   Contacts, glasses, hearing aids, dentures or bridgework may not be worn into surgery.    Leave your suitcase in the car.  After surgery it may be brought to your room.  For patients admitted to the hospital, discharge time will be determined by your treatment team.  Patients discharged the day of  surgery will not be allowed to drive home.    Special instructions:   [facility]- Preparing For Surgery  Before surgery, you can play an important role. Because skin is not sterile, your skin needs to be as free of germs as possible. You can reduce the number of germs on your skin by washing with CHG (chlorahexidine gluconate) Soap before surgery.  CHG is an antiseptic cleaner which kills germs and bonds with the skin to continue killing germs even after washing.    Oral Hygiene is also important to reduce your risk of infection.  Remember - BRUSH YOUR TEETH THE MORNING OF SURGERY WITH YOUR REGULAR TOOTHPASTE  Please do not use if you have an allergy to CHG or antibacterial soaps. If your skin becomes reddened/irritated stop using the CHG.  Do not shave (including legs and underarms) for at least 48 hours prior to first CHG shower. It is OK to shave your face.  Please follow these instructions carefully.   1. Shower the NIGHT BEFORE SURGERY and the MORNING OF SURGERY with CHG Soap.   2. If you chose to wash your hair, wash your hair first as usual with your normal shampoo.  3. After you shampoo, rinse your hair and body thoroughly to remove the shampoo.  4. Use CHG as you would any other liquid soap. You can apply CHG directly to the skin and wash gently  with a scrungie or a clean washcloth.   5. Apply the CHG Soap to your body ONLY FROM THE NECK DOWN.  Do not use on open wounds or open sores. Avoid contact with your eyes, ears, mouth and genitals (private parts). Wash Face and genitals (private parts)  with your normal soap.   6. Wash thoroughly, paying special attention to the area where your surgery will be performed.  7. Thoroughly rinse your body with warm water from the neck down.  8. DO NOT shower/wash with your normal soap after using and rinsing off the CHG Soap.  9. Pat yourself dry with a CLEAN TOWEL.  10. Wear CLEAN PAJAMAS to bed the night before surgery, wear  comfortable clothes the morning of surgery  11. Place CLEAN SHEETS on your bed the night of your first shower and DO NOT SLEEP WITH PETS.    Day of Surgery:  Do not apply any deodorants/lotions. Please shower the morning of surgery with the CHG soap  Please wear clean clothes to the hospital/surgery center.   Remember to brush your teeth WITH YOUR REGULAR TOOTHPASTE.   Please read over the following fact sheets that you were given.

## 2019-05-06 ENCOUNTER — Encounter (HOSPITAL_COMMUNITY)
Admission: RE | Admit: 2019-05-06 | Discharge: 2019-05-06 | Disposition: A | Discharge status: Home / Self Care | Payer: BC Managed Care – PPO | Source: Ambulatory Visit | Attending: Neurosurgery | Admitting: Neurosurgery

## 2019-05-06 ENCOUNTER — Encounter (HOSPITAL_COMMUNITY): Payer: Self-pay

## 2019-05-06 ENCOUNTER — Other Ambulatory Visit: Payer: Self-pay

## 2019-05-06 DIAGNOSIS — Z01812 Encounter for preprocedural laboratory examination: Secondary | ICD-10-CM | POA: Diagnosis present

## 2019-05-06 HISTORY — DX: Family history of other specified conditions: Z84.89

## 2019-05-06 LAB — CBC
HCT: 44.2 % (ref 36.0–46.0)
Hemoglobin: 14.7 g/dL (ref 12.0–15.0)
MCH: 31.3 pg (ref 26.0–34.0)
MCHC: 33.3 g/dL (ref 30.0–36.0)
MCV: 94 fL (ref 80.0–100.0)
Platelets: 287 10*3/uL (ref 150–400)
RBC: 4.7 MIL/uL (ref 3.87–5.11)
RDW: 13.2 % (ref 11.5–15.5)
WBC: 7.1 10*3/uL (ref 4.0–10.5)
nRBC: 0 % (ref 0.0–0.2)

## 2019-05-06 LAB — TYPE AND SCREEN
ABO/RH(D): B NEG
Antibody Screen: NEGATIVE

## 2019-05-06 LAB — BASIC METABOLIC PANEL
Anion gap: 11 (ref 5–15)
BUN: 7 mg/dL (ref 6–20)
CO2: 22 mmol/L (ref 22–32)
Calcium: 9.1 mg/dL (ref 8.9–10.3)
Chloride: 105 mmol/L (ref 98–111)
Creatinine, Ser: 0.81 mg/dL (ref 0.44–1.00)
GFR calc Af Amer: >60 mL/min (ref >60)
GFR calc non Af Amer: >60 mL/min (ref >60)
Glucose, Bld: 95 mg/dL (ref 70–99)
Potassium: 4.5 mmol/L (ref 3.5–5.1)
Sodium: 138 mmol/L (ref 135–145)

## 2019-05-06 LAB — SURGICAL PCR SCREEN
MRSA, PCR: NEGATIVE
Staphylococcus aureus: POSITIVE — AB

## 2019-05-06 LAB — ABO/RH: ABO/RH(D): B NEG

## 2019-05-06 NOTE — Progress Notes (Addendum)
PCP -  Dr. Deborra Medina Cardiologist - patient denies  Chest x-ray - n/a EKG - n/a Stress Test - 2010 ECHO - 2010 Cardiac Cath - patient denies  Sleep Study - patient denies CPAP -   Fasting Blood Sugar - n/a Checks Blood Sugar _____ times a day  Blood Thinner Instructions:n/a Aspirin Instructions: n/a  Anesthesia review: history of cardiac testing  Patient denies shortness of breath, fever, cough and chest pain at PAT appointment  Patient's PCR MSSA positive; patient notified; prescription called into [facility] [address], spoke with Guido Sander   Coronavirus Screening  Have you experienced the following symptoms:  Cough yes/no: No Fever (>100.46F)  yes/no: No Runny nose yes/no: No Sore throat yes/no: No Difficulty breathing/shortness of breath  yes/no: No  Have you or a family member traveled in the last 14 days and where? yes/no: No   If the patient indicates "YES" to the above questions, their PAT will be rescheduled to limit the exposure to others and, the surgeon will be notified. THE PATIENT WILL NEED TO BE ASYMPTOMATIC FOR 14 DAYS.   If the patient is not experiencing any of these symptoms, the PAT nurse will instruct them to NOT bring anyone with them to their appointment since they may have these symptoms or traveled as well.   Please remind your patients and families that hospital visitation restrictions are in effect and the importance of the restrictions.     Patient verbalized understanding of instructions that were given to them at the PAT appointment. Patient was also instructed that they will need to review over the PAT instructions again at home before surgery.

## 2019-05-07 ENCOUNTER — Other Ambulatory Visit (HOSPITAL_COMMUNITY)
Admission: RE | Admit: 2019-05-07 | Discharge: 2019-05-07 | Disposition: A | Discharge status: Home / Self Care | Payer: BC Managed Care – PPO | Source: Ambulatory Visit | Attending: Pulmonary Disease | Admitting: Pulmonary Disease

## 2019-05-07 DIAGNOSIS — Z1159 Encounter for screening for other viral diseases: Secondary | ICD-10-CM | POA: Insufficient documentation

## 2019-05-07 DIAGNOSIS — Z01812 Encounter for preprocedural laboratory examination: Secondary | ICD-10-CM | POA: Insufficient documentation

## 2019-05-08 LAB — SARS CORONAVIRUS 2 (TAT 6-24 HRS): SARS Coronavirus 2: NEGATIVE

## 2019-05-08 MED ORDER — VANCOMYCIN HCL 10 G IV SOLR
1500.0000 mg | INTRAVENOUS | Status: AC
  Administered 2019-05-11: 1500 mg via INTRAVENOUS
  Filled 2019-05-08 (×2): qty 1500

## 2019-05-10 ENCOUNTER — Encounter (HOSPITAL_COMMUNITY): Payer: Self-pay | Admitting: Certified Registered Nurse Anesthetist

## 2019-05-11 ENCOUNTER — Inpatient Hospital Stay (HOSPITAL_COMMUNITY)
Admission: RE | Admit: 2019-05-11 | Discharge: 2019-05-12 | DRG: 457 | Disposition: A | Discharge status: Home / Self Care | Payer: BC Managed Care – PPO | Attending: Neurosurgery | Admitting: Neurosurgery

## 2019-05-11 ENCOUNTER — Other Ambulatory Visit: Payer: Self-pay

## 2019-05-11 ENCOUNTER — Inpatient Hospital Stay (HOSPITAL_COMMUNITY): Payer: BC Managed Care – PPO | Admitting: Certified Registered Nurse Anesthetist

## 2019-05-11 ENCOUNTER — Inpatient Hospital Stay (HOSPITAL_COMMUNITY): Payer: BC Managed Care – PPO

## 2019-05-11 ENCOUNTER — Encounter (HOSPITAL_COMMUNITY)
Admission: RE | Disposition: A | Discharge status: Home / Self Care | Payer: Self-pay | Source: Home / Self Care | Attending: Neurosurgery

## 2019-05-11 ENCOUNTER — Inpatient Hospital Stay (HOSPITAL_COMMUNITY): Payer: BC Managed Care – PPO | Admitting: Physician Assistant

## 2019-05-11 DIAGNOSIS — M5116 Intervertebral disc disorders with radiculopathy, lumbar region: Secondary | ICD-10-CM | POA: Diagnosis present

## 2019-05-11 DIAGNOSIS — G8929 Other chronic pain: Secondary | ICD-10-CM | POA: Diagnosis present

## 2019-05-11 DIAGNOSIS — E669 Obesity, unspecified: Secondary | ICD-10-CM | POA: Diagnosis present

## 2019-05-11 DIAGNOSIS — M4156 Other secondary scoliosis, lumbar region: Secondary | ICD-10-CM | POA: Diagnosis present

## 2019-05-11 DIAGNOSIS — Z833 Family history of diabetes mellitus: Secondary | ICD-10-CM

## 2019-05-11 DIAGNOSIS — Z6841 Body Mass Index (BMI) 40.0 and over, adult: Secondary | ICD-10-CM | POA: Diagnosis not present

## 2019-05-11 DIAGNOSIS — J45909 Unspecified asthma, uncomplicated: Secondary | ICD-10-CM | POA: Diagnosis present

## 2019-05-11 DIAGNOSIS — M4150 Other secondary scoliosis, site unspecified: Secondary | ICD-10-CM | POA: Diagnosis present

## 2019-05-11 DIAGNOSIS — Z419 Encounter for procedure for purposes other than remedying health state, unspecified: Secondary | ICD-10-CM

## 2019-05-11 DIAGNOSIS — Z8249 Family history of ischemic heart disease and other diseases of the circulatory system: Secondary | ICD-10-CM | POA: Diagnosis not present

## 2019-05-11 HISTORY — PX: ANTERIOR LAT LUMBAR FUSION: SHX1168

## 2019-05-11 SURGERY — ANTERIOR LATERAL LUMBAR FUSION 1 LEVEL
Anesthesia: General | Laterality: Right

## 2019-05-11 MED ORDER — PHENYLEPHRINE 40 MCG/ML (10ML) SYRINGE FOR IV PUSH (FOR BLOOD PRESSURE SUPPORT)
PREFILLED_SYRINGE | INTRAVENOUS | Status: DC | PRN
  Administered 2019-05-11 (×4): 120 ug via INTRAVENOUS

## 2019-05-11 MED ORDER — DIPHENHYDRAMINE HCL 50 MG/ML IJ SOLN
INTRAMUSCULAR | Status: AC
  Filled 2019-05-11: qty 1

## 2019-05-11 MED ORDER — MENTHOL 3 MG MT LOZG: 1.0000 | LOZENGE | OROMUCOSAL | Status: DC | PRN

## 2019-05-11 MED ORDER — 0.9 % SODIUM CHLORIDE (POUR BTL) OPTIME
TOPICAL | Status: DC | PRN
  Administered 2019-05-11: 1000 mL

## 2019-05-11 MED ORDER — MIDAZOLAM HCL 2 MG/2ML IJ SOLN
INTRAMUSCULAR | Status: AC
  Filled 2019-05-11: qty 2

## 2019-05-11 MED ORDER — MIDAZOLAM HCL 2 MG/2ML IJ SOLN
INTRAMUSCULAR | Status: DC | PRN
  Administered 2019-05-11: 2 mg via INTRAVENOUS

## 2019-05-11 MED ORDER — DOCUSATE SODIUM 100 MG PO CAPS
100.0000 mg | ORAL_CAPSULE | Freq: Two times a day (BID) | ORAL | Status: DC
  Administered 2019-05-11 (×2): 100 mg via ORAL
  Filled 2019-05-11 (×2): qty 1

## 2019-05-11 MED ORDER — ACETAMINOPHEN 325 MG PO TABS: 650.0000 mg | ORAL_TABLET | ORAL | Status: DC | PRN

## 2019-05-11 MED ORDER — MEPERIDINE HCL 25 MG/ML IJ SOLN: 6.2500 mg | INTRAMUSCULAR | Status: DC | PRN

## 2019-05-11 MED ORDER — ONDANSETRON HCL 4 MG/2ML IJ SOLN: 4.0000 mg | Freq: Four times a day (QID) | INTRAMUSCULAR | Status: DC | PRN

## 2019-05-11 MED ORDER — PROPOFOL 10 MG/ML IV BOLUS
INTRAVENOUS | Status: AC
  Filled 2019-05-11: qty 40

## 2019-05-11 MED ORDER — ONDANSETRON HCL 4 MG PO TABS: 4.0000 mg | ORAL_TABLET | Freq: Four times a day (QID) | ORAL | Status: DC | PRN

## 2019-05-11 MED ORDER — LIDOCAINE 2% (20 MG/ML) 5 ML SYRINGE
INTRAMUSCULAR | Status: DC | PRN
  Administered 2019-05-11: 100 mg via INTRAVENOUS

## 2019-05-11 MED ORDER — BUSPIRONE HCL 10 MG PO TABS
10.0000 mg | ORAL_TABLET | Freq: Two times a day (BID) | ORAL | Status: DC
  Administered 2019-05-11: 10 mg via ORAL
  Filled 2019-05-11 (×3): qty 1

## 2019-05-11 MED ORDER — PHENYLEPHRINE 40 MCG/ML (10ML) SYRINGE FOR IV PUSH (FOR BLOOD PRESSURE SUPPORT)
PREFILLED_SYRINGE | INTRAVENOUS | Status: AC
  Filled 2019-05-11: qty 10

## 2019-05-11 MED ORDER — LACTATED RINGERS IV SOLN
INTRAVENOUS | Status: DC
  Administered 2019-05-11: 13:00:00 via INTRAVENOUS

## 2019-05-11 MED ORDER — FENTANYL CITRATE (PF) 250 MCG/5ML IJ SOLN
INTRAMUSCULAR | Status: DC | PRN
  Administered 2019-05-11: 100 ug via INTRAVENOUS
  Administered 2019-05-11 (×3): 50 ug via INTRAVENOUS

## 2019-05-11 MED ORDER — DULOXETINE HCL 30 MG PO CPEP: 30.0000 mg | ORAL_CAPSULE | Freq: Every day | ORAL | Status: DC

## 2019-05-11 MED ORDER — ALBUTEROL SULFATE (2.5 MG/3ML) 0.083% IN NEBU: 2.5000 mg | INHALATION_SOLUTION | Freq: Four times a day (QID) | RESPIRATORY_TRACT | Status: DC | PRN

## 2019-05-11 MED ORDER — ACETAMINOPHEN 10 MG/ML IV SOLN
INTRAVENOUS | Status: DC | PRN
  Administered 2019-05-11: 1000 mg via INTRAVENOUS

## 2019-05-11 MED ORDER — SUCCINYLCHOLINE CHLORIDE 200 MG/10ML IV SOSY
PREFILLED_SYRINGE | INTRAVENOUS | Status: DC | PRN
  Administered 2019-05-11: 140 mg via INTRAVENOUS

## 2019-05-11 MED ORDER — SUCCINYLCHOLINE CHLORIDE 20 MG/ML IJ SOLN: INTRAMUSCULAR | Status: DC | PRN

## 2019-05-11 MED ORDER — PROPOFOL 10 MG/ML IV BOLUS
INTRAVENOUS | Status: DC | PRN
  Administered 2019-05-11: 200 mg via INTRAVENOUS

## 2019-05-11 MED ORDER — DIPHENHYDRAMINE HCL 50 MG/ML IJ SOLN
INTRAMUSCULAR | Status: DC | PRN
  Administered 2019-05-11: 12.5 mg via INTRAVENOUS

## 2019-05-11 MED ORDER — SUCCINYLCHOLINE CHLORIDE 200 MG/10ML IV SOSY
PREFILLED_SYRINGE | INTRAVENOUS | Status: AC
  Filled 2019-05-11: qty 10

## 2019-05-11 MED ORDER — ZOLPIDEM TARTRATE 5 MG PO TABS: 5.0000 mg | ORAL_TABLET | Freq: Every evening | ORAL | Status: DC | PRN

## 2019-05-11 MED ORDER — PHENOL 1.4 % MT LIQD: 1.0000 | OROMUCOSAL | Status: DC | PRN

## 2019-05-11 MED ORDER — SODIUM CHLORIDE 0.9 % IV SOLN: 250.0000 mL | INTRAVENOUS | Status: DC

## 2019-05-11 MED ORDER — DEXAMETHASONE SODIUM PHOSPHATE 10 MG/ML IJ SOLN
INTRAMUSCULAR | Status: AC
  Filled 2019-05-11: qty 1

## 2019-05-11 MED ORDER — THROMBIN 5000 UNITS EX SOLR
CUTANEOUS | Status: AC
  Filled 2019-05-11: qty 5000

## 2019-05-11 MED ORDER — HYDROMORPHONE HCL 2 MG PO TABS
4.0000 mg | ORAL_TABLET | ORAL | Status: DC | PRN
  Administered 2019-05-11 – 2019-05-12 (×6): 4 mg via ORAL
  Filled 2019-05-11 (×6): qty 2

## 2019-05-11 MED ORDER — BUPIVACAINE-EPINEPHRINE (PF) 0.5% -1:200000 IJ SOLN
INTRAMUSCULAR | Status: AC
  Filled 2019-05-11: qty 30

## 2019-05-11 MED ORDER — ONDANSETRON HCL 4 MG/2ML IJ SOLN
INTRAMUSCULAR | Status: DC | PRN
  Administered 2019-05-11: 4 mg via INTRAVENOUS

## 2019-05-11 MED ORDER — DEXAMETHASONE SODIUM PHOSPHATE 10 MG/ML IJ SOLN
INTRAMUSCULAR | Status: DC | PRN
  Administered 2019-05-11: 10 mg via INTRAVENOUS

## 2019-05-11 MED ORDER — FENTANYL CITRATE (PF) 250 MCG/5ML IJ SOLN
INTRAMUSCULAR | Status: AC
  Filled 2019-05-11: qty 5

## 2019-05-11 MED ORDER — BUTALBITAL-APAP-CAFFEINE 50-325-40 MG PO TABS: 1.0000 | ORAL_TABLET | Freq: Four times a day (QID) | ORAL | Status: DC | PRN

## 2019-05-11 MED ORDER — SODIUM CHLORIDE 0.9% FLUSH: 3.0000 mL | INTRAVENOUS | Status: DC | PRN

## 2019-05-11 MED ORDER — BACITRACIN ZINC 500 UNIT/GM EX OINT
TOPICAL_OINTMENT | CUTANEOUS | Status: AC
  Filled 2019-05-11: qty 28.35

## 2019-05-11 MED ORDER — LIDOCAINE 2% (20 MG/ML) 5 ML SYRINGE
INTRAMUSCULAR | Status: AC
  Filled 2019-05-11: qty 5

## 2019-05-11 MED ORDER — MORPHINE SULFATE (PF) 4 MG/ML IV SOLN: 4.0000 mg | INTRAVENOUS | Status: DC | PRN

## 2019-05-11 MED ORDER — ACETAMINOPHEN 500 MG PO TABS
1000.0000 mg | ORAL_TABLET | Freq: Four times a day (QID) | ORAL | Status: DC
  Administered 2019-05-11 – 2019-05-12 (×3): 1000 mg via ORAL
  Filled 2019-05-11 (×3): qty 2

## 2019-05-11 MED ORDER — SODIUM CHLORIDE 0.9 % IV SOLN
INTRAVENOUS | Status: DC | PRN
  Administered 2019-05-11: 500 mL

## 2019-05-11 MED ORDER — BACITRACIN ZINC 500 UNIT/GM EX OINT
TOPICAL_OINTMENT | CUTANEOUS | Status: DC | PRN
  Administered 2019-05-11: 1 via TOPICAL

## 2019-05-11 MED ORDER — SODIUM CHLORIDE 0.9% FLUSH: 3.0000 mL | Freq: Two times a day (BID) | INTRAVENOUS | Status: DC

## 2019-05-11 MED ORDER — ACETAMINOPHEN 500 MG PO TABS: 500.0000 mg | ORAL_TABLET | Freq: Four times a day (QID) | ORAL | Status: DC | PRN

## 2019-05-11 MED ORDER — CHLORHEXIDINE GLUCONATE CLOTH 2 % EX PADS: 6.0000 | MEDICATED_PAD | Freq: Once | CUTANEOUS | Status: DC

## 2019-05-11 MED ORDER — HYDROMORPHONE HCL 1 MG/ML IJ SOLN
0.2500 mg | INTRAMUSCULAR | Status: DC | PRN
  Administered 2019-05-11: 0.5 mg via INTRAVENOUS
  Administered 2019-05-11 (×2): 0.25 mg via INTRAVENOUS

## 2019-05-11 MED ORDER — BISACODYL 10 MG RE SUPP: 10.0000 mg | Freq: Every day | RECTAL | Status: DC | PRN

## 2019-05-11 MED ORDER — VANCOMYCIN HCL 10 G IV SOLR
1250.0000 mg | Freq: Once | INTRAVENOUS | Status: AC
  Administered 2019-05-11: 1250 mg via INTRAVENOUS
  Filled 2019-05-11 (×2): qty 1250

## 2019-05-11 MED ORDER — CYCLOBENZAPRINE HCL 10 MG PO TABS
10.0000 mg | ORAL_TABLET | Freq: Three times a day (TID) | ORAL | Status: DC | PRN
  Administered 2019-05-11: 10 mg via ORAL
  Filled 2019-05-11: qty 1

## 2019-05-11 MED ORDER — ACETAMINOPHEN 650 MG RE SUPP: 650.0000 mg | RECTAL | Status: DC | PRN

## 2019-05-11 MED ORDER — HYDROMORPHONE HCL 1 MG/ML IJ SOLN
INTRAMUSCULAR | Status: AC
  Filled 2019-05-11: qty 1

## 2019-05-11 MED ORDER — ONDANSETRON HCL 4 MG/2ML IJ SOLN
INTRAMUSCULAR | Status: AC
  Filled 2019-05-11: qty 2

## 2019-05-11 MED ORDER — BUPIVACAINE-EPINEPHRINE 0.5% -1:200000 IJ SOLN
INTRAMUSCULAR | Status: DC | PRN
  Administered 2019-05-11: 10 mL

## 2019-05-11 MED ORDER — SODIUM CHLORIDE 0.9 % IV SOLN
INTRAVENOUS | Status: DC | PRN
  Administered 2019-05-11: 25 ug/min via INTRAVENOUS

## 2019-05-11 MED ORDER — THROMBIN 5000 UNITS EX SOLR
OROMUCOSAL | Status: DC | PRN
  Administered 2019-05-11: 5 mL via TOPICAL

## 2019-05-11 MED ORDER — PROMETHAZINE HCL 25 MG/ML IJ SOLN: 6.2500 mg | INTRAMUSCULAR | Status: DC | PRN

## 2019-05-11 MED ORDER — LACTATED RINGERS IV SOLN
INTRAVENOUS | Status: DC | PRN
  Administered 2019-05-11 (×2): via INTRAVENOUS

## 2019-05-11 MED ORDER — BUPROPION HCL ER (XL) 300 MG PO TB24: 300.0000 mg | ORAL_TABLET | Freq: Every day | ORAL | Status: DC

## 2019-05-11 MED ORDER — GABAPENTIN 300 MG PO CAPS
300.0000 mg | ORAL_CAPSULE | Freq: Two times a day (BID) | ORAL | Status: DC
  Administered 2019-05-11: 300 mg via ORAL
  Filled 2019-05-11: qty 1

## 2019-05-11 SURGICAL SUPPLY — 48 items
ADH SKN CLS APL DERMABOND .7 (GAUZE/BANDAGES/DRESSINGS)
APL SKNCLS STERI-STRIP NONHPOA (GAUZE/BANDAGES/DRESSINGS) ×1
BENZOIN TINCTURE PRP APPL 2/3 (GAUZE/BANDAGES/DRESSINGS) ×2 IMPLANT
BLADE CLIPPER SURG (BLADE) IMPLANT
BONE MATRIX OSTEOCEL PRO MED (Bone Implant) ×2 IMPLANT
BUR PRECISION FLUTE 6.0 (BURR) ×2 IMPLANT
CAGE MODULUS XL 10X18X50 - 10 (Cage) ×2 IMPLANT
CARTRIDGE OIL MAESTRO DRILL (MISCELLANEOUS) ×1 IMPLANT
COVER WAND RF STERILE (DRAPES) IMPLANT
DERMABOND ADVANCED (GAUZE/BANDAGES/DRESSINGS)
DERMABOND ADVANCED .7 DNX12 (GAUZE/BANDAGES/DRESSINGS) IMPLANT
DIFFUSER DRILL AIR PNEUMATIC (MISCELLANEOUS) ×2 IMPLANT
DRAPE C-ARM 42X72 X-RAY (DRAPES) ×2 IMPLANT
DRAPE C-ARMOR (DRAPES) ×2 IMPLANT
DRAPE LAPAROTOMY 100X72X124 (DRAPES) ×2 IMPLANT
DRSG OPSITE POSTOP 4X6 (GAUZE/BANDAGES/DRESSINGS) ×4 IMPLANT
DURAPREP 26ML APPLICATOR (WOUND CARE) ×2 IMPLANT
ELECT REM PT RETURN 9FT ADLT (ELECTROSURGICAL) ×2
ELECTRODE REM PT RTRN 9FT ADLT (ELECTROSURGICAL) ×1 IMPLANT
GAUZE 4X4 16PLY RFD (DISPOSABLE) IMPLANT
GLOVE BIO SURGEON STRL SZ8 (GLOVE) ×2 IMPLANT
GLOVE BIO SURGEON STRL SZ8.5 (GLOVE) ×2 IMPLANT
GLOVE EXAM NITRILE XL STR (GLOVE) IMPLANT
GOWN STRL REUS W/ TWL LRG LVL3 (GOWN DISPOSABLE) IMPLANT
GOWN STRL REUS W/ TWL XL LVL3 (GOWN DISPOSABLE) IMPLANT
GOWN STRL REUS W/TWL LRG LVL3 (GOWN DISPOSABLE)
GOWN STRL REUS W/TWL XL LVL3 (GOWN DISPOSABLE)
KIT BASIN OR (CUSTOM PROCEDURE TRAY) ×2 IMPLANT
KIT DILATOR XLIF 5 (KITS) ×2 IMPLANT
KIT SURGICAL ACCESS MAXCESS 4 (KITS) ×2 IMPLANT
KIT TURNOVER KIT B (KITS) ×2 IMPLANT
MODULE NVM5 NEXT GEN EMG (NEEDLE) ×2 IMPLANT
NEEDLE HYPO 25X1 1.5 SAFETY (NEEDLE) ×2 IMPLANT
NS IRRIG 1000ML POUR BTL (IV SOLUTION) ×2 IMPLANT
OIL CARTRIDGE MAESTRO DRILL (MISCELLANEOUS) ×2
PACK LAMINECTOMY NEURO (CUSTOM PROCEDURE TRAY) ×2 IMPLANT
PLATE 2H 10MM (Plate) ×2 IMPLANT
SCREW DECADE 5.5X50 (Screw) ×4 IMPLANT
SPONGE LAP 4X18 RFD (DISPOSABLE) ×2 IMPLANT
STRIP CLOSURE SKIN 1/2X4 (GAUZE/BANDAGES/DRESSINGS) ×2 IMPLANT
SUT VIC AB 1 CT1 18XBRD ANBCTR (SUTURE) ×1 IMPLANT
SUT VIC AB 1 CT1 8-18 (SUTURE) ×2
SUT VIC AB 2-0 CP2 18 (SUTURE) ×2 IMPLANT
SUT VIC AB 3-0 SH 8-18 (SUTURE) ×2 IMPLANT
TOWEL GREEN STERILE (TOWEL DISPOSABLE) ×2 IMPLANT
TOWEL GREEN STERILE FF (TOWEL DISPOSABLE) ×2 IMPLANT
TRAY FOLEY MTR SLVR 16FR STAT (SET/KITS/TRAYS/PACK) ×2 IMPLANT
WATER STERILE IRR 1000ML POUR (IV SOLUTION) ×2 IMPLANT

## 2019-05-11 NOTE — Anesthesia Procedure Notes (Signed)
Procedure Name: Intubation Date/Time: 05/11/2019 7:32 AM Performed by: Alain Marion, CRNA Pre-anesthesia Checklist: Patient identified, Emergency Drugs available, Suction available and Patient being monitored Patient Re-evaluated:Patient Re-evaluated prior to induction Oxygen Delivery Method: Circle System Utilized Preoxygenation: Pre-oxygenation with 100% oxygen Induction Type: IV induction and Rapid sequence Laryngoscope Size: Miller and 2 Grade View: Grade I Tube type: Oral Tube size: 7.0 mm Number of attempts: 1 Airway Equipment and Method: Stylet Placement Confirmation: ETT inserted through vocal cords under direct vision,  positive ETCO2 and breath sounds checked- equal and bilateral Secured at: 21 cm Tube secured with: Tape Dental Injury: Teeth and Oropharynx as per pre-operative assessment

## 2019-05-11 NOTE — Progress Notes (Signed)
Subjective: The patient is alert and pleasant.  She is in no apparent distress.  She looks well.  Objective: Vital signs in last 24 hours: Temp:  [97.6 F (36.4 C)-98.1 F (36.7 C)] 97.6 F (36.4 C) (07/13 1040) Pulse Rate:  [75-85] 85 (07/13 1055) Resp:  [17-18] 18 (07/13 1055) BP: (106-143)/(60-96) 121/83 (07/13 1055) SpO2:  [98 %-100 %] 98 % (07/13 1055) Weight:  [128.3 kg] 128.3 kg (07/13 0554) Estimated body mass index is 46.36 kg/m as calculated from the following:   Height as of this encounter: 5' 5.5" (1.664 m).   Weight as of this encounter: 128.3 kg.   Intake/Output from previous day: No intake/output data recorded. Intake/Output this shift: Total I/O In: 1200 [I.V.:1200] Out: 750 [Urine:700; Blood:50]  Physical exam the patient is alert and pleasant.  She is moving her lower extremities well.  Lab Results: No results for input(s): WBC, HGB, HCT, PLT in the last 72 hours. BMET No results for input(s): NA, K, CL, CO2, GLUCOSE, BUN, CREATININE, CALCIUM in the last 72 hours.  Studies/Results: Dg Lumbar Spine 2-3 Views  Result Date: 05/11/2019 CLINICAL DATA:  Portable imaging for L2-L3 interbody fusion. EXAM: DG C-ARM 61-120 MIN; LUMBAR SPINE - 2-3 VIEW COMPARISON:  None. FINDINGS: Two submitted portable images show placement of interbody fusion hardware, supported by a right lateral fixation plate and 2 screws extending across the lower vertebral body of L2 in the upper vertebral body of L3. The orthopedic hardware appears well seated and well-positioned. IMPRESSION: Portable imaging provided for interbody fusion at L2-L3. Electronically Signed   By: Lajean Manes M.D.   On: 05/11/2019 10:32   Dg C-arm 1-60 Min  Result Date: 05/11/2019 CLINICAL DATA:  Portable imaging for L2-L3 interbody fusion. EXAM: DG C-ARM 61-120 MIN; LUMBAR SPINE - 2-3 VIEW COMPARISON:  None. FINDINGS: Two submitted portable images show placement of interbody fusion hardware, supported by a right  lateral fixation plate and 2 screws extending across the lower vertebral body of L2 in the upper vertebral body of L3. The orthopedic hardware appears well seated and well-positioned. IMPRESSION: Portable imaging provided for interbody fusion at L2-L3. Electronically Signed   By: Lajean Manes M.D.   On: 05/11/2019 10:32    Assessment/Plan: The patient is doing well.  I spoke with her husband.  LOS: 0 days     Ophelia Charter 05/11/2019, 11:06 AM

## 2019-05-11 NOTE — Progress Notes (Signed)
Pharmacy Antibiotic Note  [name] is a 43 y.o. female admitted on 05/11/2019 for lumbar surgery. PCN allergy, Vancomycin 1500 mg IV given pre-op at 0721.  Pharmacy has been consulted for Vancomycin dosing x 1 dose 12 hours post-op.  No drain.  Plan:  Vancomycin 1250 mg IV x 1 at 8pm tonight.  No follow up needed.  Pharmacy signing off.  Height: 5' 5.5" (166.4 cm) Weight: 282 lb 14.4 oz (128.3 kg) IBW/kg (Calculated) : 58.15  Temp (24hrs), Avg:97.9 F (36.6 C), Min:97.6 F (36.4 C), Max:98.1 F (36.7 C)  Recent Labs  Lab 05/06/19 1151  WBC 7.1  CREATININE 0.81    Estimated Creatinine Clearance: 120.6 mL/min (by C-G formula based on SCr of 0.81 mg/dL).    Allergies  Allergen Reactions  . Cashew Nut Oil Anaphylaxis  . Cauliflower [Brassica Oleracea Italica] Anaphylaxis  . Whey Anaphylaxis  . Augmentin [Amoxicillin-Pot Clavulanate] Diarrhea and Nausea And Vomiting    Has patient had a PCN reaction causing immediate rash, facial/tongue/throat swelling, SOB or lightheadedness with hypotension: No Has patient had a PCN reaction causing severe rash involving mucus membranes or skin necrosis: No Has patient had a PCN reaction that required hospitalization: No Has patient had a PCN reaction occurring within the last 10 years: Unknown If all of the above answers are "NO", then may proceed with Cephalosporin use.   Haig Prophet Fruit [Citrus] Other (See Comments)    Migraines   . Gluten Meal Other (See Comments)    Severe GI upset  . Milk-Related Compounds   . Other     epidural steroid injection - severe headaches, high fever  . Codeine Rash  . Tree Extract Itching    Tee tree oil    Antimicrobials this admission:   Vancomycin peri-op 7/13  Microbiology results:  7/8 surgical PCR:  Positive for Staph aureus, negative for MRSA  Thank you for allowing pharmacy to be a part of this patient's care.  Arty Baumgartner, [place] Pager: [phone] or phone:  [phone] 05/11/2019 12:57 PM

## 2019-05-11 NOTE — Op Note (Signed)
Brief history: The patient is a 43 year old white female who has complained of chronic back pain.  She has failed medical management and was worked up with a lumbar MRI and lumbar myelo CT.  This demonstrated degenerative disc disease at L2-3 and an adult degenerative scoliosis.  I discussed the various treatment options with the patient.  She has decided to proceed with surgery after weighing the risks, benefits and alternatives.  Preop diagnosis: Lumbar degenerative disc disease, adult degenerative scoliosis, lumbago  Postop diagnosis: The same  Procedure: L2-3 anterior lateral interbody arthrodesis with local morselized autograft and Nuvasive DBM; insertion of titanium interbody prosthesis at L2-3 (NuVasive); anterior lateral instrumentation L2-3 with invasive titanium plate and screws; neuro monitoring  Surgeon: Dr. Earle Gell  Assistant: Arnetha Massy nurse practitioner  Anesthesia: General tracheal  Estimated blood loss: Minimal  Specimens: None  Drains: None  Complications: None  Description of procedure: The patient was brought to the operating room by the anesthesia team.  General endotracheal anesthesia was induced.  The patient was turned to the lateral position with her right side up.  He was appropriately taped and padded.  The bed was flexed.  The patient's right abdomen flank and back was prepared with Betadine scrub and Betadine solution.  Sterile drapes were applied.  I injected the areas to be incised with Marcaine with epinephrine solution.  The first incision was just lateral to the L2-3 interspace, the second incision was further posterior.  I then carefully bluntly finger dissected from the posterior incision.  I used the Kelly forceps to pierce the transversalis ligament.  I carefully continued to finger dissect into the retroperitoneal space down to the lateral aspect of the spine.  I then similarly dissected from the more anterior incision the fascia into the  retroperitoneal space using my finger in the more posterior incision as a guide.  Under fluoroscopic guidance we placed a dilator in the retroperitoneal space and docked it along the side of the L2-3 interspace.  We stimulated the lateral aspect of the vertebral body.  We did not encounter the lumbar plexus.  We inserted a K wire under fluoroscopic guidance.  We placed a series of dilators over the smaller dilator and continued to stimulate.  We inserted the retractor.  We confirmed the good position of the retractor using both AP and lateral fluoroscopy.  We opened the retractor slightly and again stimulated posterior lateral vertebral body/disc space.  Again we did not encounter the lumbar plexus.  I then used the 15 blade scalpel to incise the right L2-3 intervertebral disc.  We performed a partial intervertebral discectomy using the pituitary forceps and the curettes.  We used a Cobb curette to pierce the contralateral annulus.  We clear the soft tissue from the L2-3 interspace.  After I was satisfied with the discectomy, we used the trial spacers and determined to use a 10 mm interbody prosthesis.  The titanium prosthesis was prefilled with some local bone we obtained during the discectomy and scraping but mainly with the DBM.  I inserted the prosthesis into the interspace under fluoroscopic guidance.  We now turned our attention to the instrumentation.  I placed a lateral plate at the right H0-6 disc space.  We used the awl and determined use 50 mm screws.  We secured the plate to the L2 and L3 vertebral bodies using 50 mm screws.  We got good bony purchase.  We confirmed good location of the prosthesis, plate and screws with AP and lateral  fluoroscopy.  We secured the screws in place with the locking screws, completing the instrumentation.  We then irrigated the wound out with bacitracin solution.  We then collapsed and removed the retractor under direct visualization.  I then reapproximated the  patient's subcutaneous tissue with interrupted 2-0 Vicryl suture.  We reapproximated the skin with Steri-Strips and benzoin.  The patient was then returned to the supine position.    By report all sponge, instrument and needle counts were correct at the end this case.

## 2019-05-11 NOTE — Anesthesia Postprocedure Evaluation (Signed)
Anesthesia Post Note  Patient: [name]  Procedure(s) Performed: ANTERIOR LATERAL LUMBAR INTERBODY FUSION, LATERAL INSTRUMENTATION, RIGHT LUMBAR TWO- LUMBAR THREE (Right )     Patient location during evaluation: PACU Anesthesia Type: General Level of consciousness: sedated and patient cooperative Pain management: pain level controlled Vital Signs Assessment: post-procedure vital signs reviewed and stable Respiratory status: spontaneous breathing Cardiovascular status: stable Anesthetic complications: no    Last Vitals:  Vitals:   05/11/19 1110 05/11/19 1125  BP: 120/67 119/64  Pulse: 78 79  Resp: (!) 21 17  Temp:    SpO2: 94% 92%    Last Pain:  Vitals:   05/11/19 1125  TempSrc:   PainSc: [place]

## 2019-05-11 NOTE — Evaluation (Signed)
Physical Therapy Evaluation Patient Details Name: [name] MRN: [id number] DOB: 08-09-1976 Today's Date: 05/11/2019   History of Present Illness  Pt admit for ANTERIOR LATERAL LUMBAR INTERBODY FUSION, LATERAL INSTRUMENTATION, RIGHT LUMBAR TWO- LUMBAR THREE.  Clinical Impression  Pt admitted with above diagnosis. Pt currently with functional limitations due to the deficits listed below (see PT Problem List). Pt was able to ambulate with min guard assist and cues with RW and definitely needed RW for safety and stability.  Pt agrees.  Pt also needs 3N1 to be able to get off toilet.  Pt educated regarding precautions and needs reinforcement with precautions as she had had pain meds and was still groggy.  Pt having right LE pain but worked through it and was able to participate.  Will need equipment as below.  Progressing well.   Pt will benefit from skilled PT to increase their independence and safety with mobility to allow discharge to the venue listed below.      Follow Up Recommendations No PT follow up;Supervision/Assistance - 24 hour    Equipment Recommendations  Rolling walker with 5" wheels;3in1 (PT)(Needs adaptive equipment - OT to address)    Recommendations for Other Services       Precautions / Restrictions Precautions Precautions: Fall;Back Precaution Booklet Issued: Yes (comment) Precaution Comments: Pt needs reinforcement for precautions due to pain meds making her sleepy and unable to recall all precautions at end of treatment Required Braces or Orthoses: Spinal Brace Spinal Brace: Applied in sitting position;Lumbar corset Restrictions Weight Bearing Restrictions: No      Mobility  Bed Mobility Overal bed mobility: Needs Assistance Bed Mobility: Rolling;Sidelying to Sit Rolling: Min guard Sidelying to sit: Min guard       General bed mobility comments: min guard and cues for safety and technique  Transfers Overall transfer level: Needs  assistance Equipment used: Rolling walker (2 wheeled) Transfers: Sit to/from Stand Sit to Stand: Min guard         General transfer comment: Pt donned brace at EOB with cues and instruction.  Pt needed cues for technique.to stand for hand placement.  Needed HHA to stand as well for support therefore decided to use RW.   Ambulation/Gait Ambulation/Gait assistance: Min guard;Supervision Gait Distance (Feet): 250 Feet Assistive device: Rolling walker (2 wheeled) Gait Pattern/deviations: Step-through pattern;Decreased stride length   Gait velocity interpretation: <1.31 ft/sec, indicative of household ambulator General Gait Details: Pt steady with RW not needing steadying assist aas long as she had the RW.  Pt slightly unsteady without the RW and was reaching for walls and furniture.    Stairs            Wheelchair Mobility    Modified Rankin (Stroke Patients Only)       Balance Overall balance assessment: Needs assistance Sitting-balance support: No upper extremity supported;Feet supported Sitting balance-Leahy Scale: Fair     Standing balance support: Bilateral upper extremity supported;During functional activity Standing balance-Leahy Scale: Poor Standing balance comment: relies on UE suport for balance                             Pertinent Vitals/Pain Pain Assessment: Faces Faces Pain Scale: Hurts whole lot Pain Location: back, right LE Pain Descriptors / Indicators: Sore;Grimacing;Guarding;Radiating Pain Intervention(s): Limited activity within patient's tolerance;Monitored during session;Repositioned;Premedicated before session;Patient requesting pain meds-RN notified(Dilaudid per nurse PTA, nurse brought muscle relaxer )    Home Living Family/patient expects to be discharged  to:: Private residence Living Arrangements: Spouse/significant other;Children Available Help at Discharge: Family;Available 24 hours/day Type of Home: House Home Access:  Stairs to enter Entrance Stairs-Rails: Left Entrance Stairs-Number of Steps: 6 Home Layout: One level Home Equipment: Cane - single point      Prior Function Level of Independence: Independent         Comments: States husband would steady her at times     Hand Dominance        Extremity/Trunk Assessment   Upper Extremity Assessment Upper Extremity Assessment: Defer to OT evaluation    Lower Extremity Assessment Lower Extremity Assessment: Generalized weakness    Cervical / Trunk Assessment Cervical / Trunk Assessment: Normal  Communication   Communication: No difficulties  Cognition Arousal/Alertness: Awake/alert Behavior During Therapy: WFL for tasks assessed/performed Overall Cognitive Status: Within Functional Limits for tasks assessed                                        General Comments      Exercises     Assessment/Plan    PT Assessment Patient needs continued PT services  PT Problem List Decreased activity tolerance;Decreased balance;Decreased mobility;Decreased knowledge of use of DME;Decreased safety awareness;Decreased knowledge of precautions;Cardiopulmonary status limiting activity;Pain       PT Treatment Interventions DME instruction;Gait training;Functional mobility training;Therapeutic activities;Therapeutic exercise;Balance training;Patient/family education    PT Goals (Current goals can be found in the Care Plan section)  Acute Rehab PT Goals Patient Stated Goal: to go home PT Goal Formulation: With patient Time For Goal Achievement: 05/25/19 Potential to Achieve Goals: Good    Frequency Min 5X/week   Barriers to discharge        Co-evaluation               AM-PAC PT "6 Clicks" Mobility  Outcome Measure Help needed turning from your back to your side while in a flat bed without using bedrails?: A Little Help needed moving from lying on your back to sitting on the side of a flat bed without using  bedrails?: A Little Help needed moving to and from a bed to a chair (including a wheelchair)?: A Little Help needed standing up from a chair using your arms (e.g., wheelchair or bedside chair)?: A Little Help needed to walk in hospital room?: A Little Help needed climbing 3-5 steps with a railing? : A Little 6 Click Score: 18    End of Session Equipment Utilized During Treatment: Gait belt;Back brace Activity Tolerance: Patient limited by fatigue Patient left: in chair;with call bell/phone within reach Nurse Communication: Mobility status;Patient requests pain meds PT Visit Diagnosis: Unsteadiness on feet (R26.81);Muscle weakness (generalized) (M62.81);Pain Pain - Right/Left: Right Pain - part of body: Leg(back)    Time: 3500-9381 PT Time Calculation (min) (ACUTE ONLY): 38 min   Charges:   PT Evaluation $PT Eval Moderate Complexity: 1 Mod PT Treatments $Gait Training: 8-22 mins $Self Care/Home Management: 8-22        [place] Pager:  [phone]  Office:  [place] 05/11/2019, 4:37 PM

## 2019-05-11 NOTE — H&P (Signed)
Subjective: The patient is a 43 year old white female who is complained of back and leg pain.  She has failed medical management.  She was worked up with a lumbar MRI and lumbar x-rays which demonstrated L2-3 degenerative disc disease.  I discussed the various treatment options.  She has decided to proceed with surgery.  Past Medical History:  Diagnosis Date  . Anemia   . Arthritis   . Asthma    EXERCISE INDUCED  . Bursitis of hip   . Depression   . Family history of adverse reaction to anesthesia    patient states father had a reaction to anesthesia about 20 years ago where his face and throat swelled, had to be reintubated."  . Generalized headaches   . Irregular menses   . Obesity (BMI 30-39.9)     Past Surgical History:  Procedure Laterality Date  . ABDOMINAL HYSTERECTOMY    . ACHILLES TENDON LENGTHENING  1989  . ANKLE GANGLION CYST EXCISION  02/2012   left ankle  . BIOPSY  03/16/2019   Procedure: BIOPSY;  Surgeon: Corbin Adeourk, Robert M, MD;  Location: [facility];  Service: Endoscopy;;  Gastric   . COLONOSCOPY WITH PROPOFOL N/A 03/16/2019   Procedure: COLONOSCOPY WITH PROPOFOL;  Surgeon: Corbin Adeourk, Robert M, MD;  Location: [facility];  Service: Endoscopy;  Laterality: N/A;  11:00am  . DILATION AND CURETTAGE OF UTERUS  2003  . ENTEROSCOPY N/A 04/02/2019   Procedure: ENTEROSCOPY WITH PROPOFOL;  Surgeon: Corbin Adeourk, Robert M, MD;  Location: [facility];  Service: Endoscopy;  Laterality: N/A;  8:30am push enteroscopy with pediatric colonoscopy and J view of ampulla with duodenoscope  . ESOPHAGOGASTRODUODENOSCOPY (EGD) WITH PROPOFOL N/A 03/16/2019   Procedure: ESOPHAGOGASTRODUODENOSCOPY (EGD) WITH PROPOFOL;  Surgeon: Corbin Adeourk, Robert M, MD;  Location: [facility];  Service: Endoscopy;  Laterality: N/A;  . FEET SURGERY  1990   INSERTION OF BONE GRAFT IN FEET  . GIVENS CAPSULE STUDY N/A 03/16/2019   Procedure: GIVENS CAPSULE STUDY;  Surgeon: Corbin Adeourk, Robert M, MD;  Location: [facility];  Service:  Endoscopy;  Laterality: N/A;  . LAPAROSCOPIC GASTRIC BANDING  10/17/10   Dr Gaynelle AduEric Wilson; [facility]  . LAPAROSCOPIC REPAIR AND REMOVAL OF GASTRIC BAND  01/2018  . LIPOMA EXCISION  02/2006   abdomen    Allergies  Allergen Reactions  . Cashew Nut Oil Anaphylaxis  . Cauliflower [Brassica Oleracea Italica] Anaphylaxis  . Whey Anaphylaxis  . Augmentin [Amoxicillin-Pot Clavulanate] Diarrhea and Nausea And Vomiting    Has patient had a PCN reaction causing immediate rash, facial/tongue/throat swelling, SOB or lightheadedness with hypotension: No Has patient had a PCN reaction causing severe rash involving mucus membranes or skin necrosis: No Has patient had a PCN reaction that required hospitalization: No Has patient had a PCN reaction occurring within the last 10 years: Unknown If all of the above answers are "NO", then may proceed with Cephalosporin use.   Erskine Emery. Orange Fruit [Citrus] Other (See Comments)    Migraines   . Gluten Meal Other (See Comments)    Severe GI upset  . Milk-Related Compounds   . Other     epidural steroid injection - severe headaches, high fever  . Codeine Rash  . Tree Extract Itching    Tee tree oil    Social History   Tobacco Use  . Smoking status: Never Smoker  . Smokeless tobacco: Never Used  Substance Use Topics  . Alcohol use: No    Family History  Problem Relation  Age of Onset  . Heart disease Maternal Grandmother        V. Fib arrest  . Hypertension Mother   . Hyperlipidemia Mother   . Diabetes Mother        pre-diabetes  . Hypertension Father   . Hyperlipidemia Father   . Heart disease Father   . Diabetes Father   . Cancer Father        skin  . Diabetes Sister   . Hypertension Sister   . Cancer Maternal Grandfather        lung and skin  . Colon cancer Maternal Grandfather   . Cancer Paternal Grandfather        stomach and skin  . Pleurisy Son   . Migraines Son   . Colon polyps Other        aunt and uncle   Prior to Admission  medications   Medication Sig Start Date End Date Taking? Authorizing Provider  acetaminophen (TYLENOL) 500 MG tablet Take 500 mg by mouth every 6 (six) hours as needed for moderate pain.   Yes [provider]  albuterol (PROAIR HFA) 108 (90 BASE) MCG/ACT inhaler Inhale 2 puffs into the lungs every 6 (six) hours as needed for wheezing.   Yes [provider]  buPROPion (WELLBUTRIN XL) 150 MG 24 hr tablet Take 2 tablets (300 mg total) by mouth daily. 04/09/19  Yes Lucille Passy, MD  busPIRone (BUSPAR) 10 MG tablet Take 10 mg by mouth 2 (two) times daily.  02/25/19  Yes [provider]  butalbital-acetaminophen-caffeine (FIORICET, ESGIC) 50-325-40 MG tablet Take 1 tablet by mouth every 6 (six) hours as needed for headache.    Yes [provider]  diclofenac sodium (VOLTAREN) 1 % GEL 3 grams to 3 large joints up to 3 times daily Patient taking differently: Apply 3 g topically 3 (three) times daily as needed (pain). 3 grams to 3 large joints up to 3 times daily as needed 10/01/18  Yes Deveshwar, Abel Presto, MD  DULoxetine (CYMBALTA) 30 MG capsule Take 1 capsule (30 mg total) by mouth daily. 04/09/19  Yes Lucille Passy, MD  EPINEPHrine (EPIPEN) 0.3 mg/0.3 mL DEVI Inject 0.3 mLs (0.3 mg total) into the muscle once. Patient taking differently: Inject 0.3 mg into the muscle as needed (anaphylaxis).  12/10/11  Yes Lucille Passy, MD  gabapentin (NEURONTIN) 300 MG capsule Take 1 capsule (300 mg total) by mouth 2 (two) times daily. 04/09/19  Yes Lucille Passy, MD  trolamine salicylate (ASPERCREME) 10 % cream Apply 1 application topically as needed for muscle pain.   Yes [provider]     Review of Systems  Positive ROS: As above  All other systems have been reviewed and were otherwise negative with the exception of those mentioned in the HPI and as above.  Objective: Vital signs in last 24 hours: Temp:  [98.1 F (36.7 C)] 98.1 F (36.7 C) (07/13 0554) Pulse Rate:   [84] 84 (07/13 0554) Resp:  [18] 18 (07/13 0554) BP: (143)/(96) 143/96 (07/13 0554) SpO2:  [98 %] 98 % (07/13 0554) Weight:  [128.3 kg] 128.3 kg (07/13 0554) Estimated body mass index is 46.36 kg/m as calculated from the following:   Height as of this encounter: 5' 5.5" (1.664 m).   Weight as of this encounter: 128.3 kg.   General Appearance: Alert, obese Head: Normocephalic, without obvious abnormality, atraumatic Eyes: PERRL, conjunctiva/corneas clear, EOM's intact,    Ears: Normal  Throat: Normal  Neck:  Supple, Back: unremarkable Lungs: Clear to auscultation bilaterally, respirations unlabored Heart: Regular rate and rhythm, no murmur, rub or gallop Abdomen: Soft, non-tender Extremities: Extremities normal, atraumatic, no cyanosis or edema Skin: unremarkable  NEUROLOGIC:   Mental status: alert and oriented,Motor Exam - grossly normal Sensory Exam - grossly normal Reflexes:  Coordination - grossly normal Gait - grossly normal Balance - grossly normal Cranial Nerves: I: smell Not tested  II: visual acuity  OS: Normal  OD: Normal   II: visual fields Full to confrontation  II: pupils Equal, round, reactive to light  III,VII: ptosis None  III,IV,VI: extraocular muscles  Full ROM  V: mastication Normal  V: facial light touch sensation  Normal  V,VII: corneal reflex  Present  VII: facial muscle function - upper  Normal  VII: facial muscle function - lower Normal  VIII: hearing Not tested  IX: soft palate elevation  Normal  IX,X: gag reflex Present  XI: trapezius strength  5/5  XI: sternocleidomastoid strength 5/5  XI: neck flexion strength  5/5  XII: tongue strength  Normal    Data Review Lab Results  Component Value Date   WBC 7.1 05/06/2019   HGB 14.7 05/06/2019   HCT 44.2 05/06/2019   MCV 94.0 05/06/2019   PLT 287 05/06/2019   Lab Results  Component Value Date   NA 138 05/06/2019   K 4.5 05/06/2019   CL 105 05/06/2019   CO2 22 05/06/2019   BUN 7  05/06/2019   CREATININE 0.81 05/06/2019   GLUCOSE 95 05/06/2019   No results found for: INR, PROTIME  Assessment/Plan: L2-3 degenerative disc disease, lumbago, lumbar radiculopathy: I have discussed the situation with the patient.  I have reviewed her imaging studies with her and pointed out the abnormalities.  We have discussed the various treatment options including surgery.  I have described the surgical treatment option of a L2-3 anterior lateral interbody arthrodesis and instrumentation.  I have described the surgery to her.  I have shown her surgical models.  We have discussed the risks, benefits, alternatives, expected postop course, and likelihood of achieving our goals with surgery.  I have answered all her questions.  She has decided proceed with surgery.   Cristi LoronJeffrey D Marvette Schamp 05/11/2019 7:25 AM

## 2019-05-11 NOTE — Transfer of Care (Signed)
Immediate Anesthesia Transfer of Care Note  Patient: [name]  Procedure(s) Performed: ANTERIOR LATERAL LUMBAR INTERBODY FUSION, LATERAL INSTRUMENTATION, RIGHT LUMBAR TWO- LUMBAR THREE (Right )  Patient Location: PACU  Anesthesia Type:General  Level of Consciousness: awake, alert  and oriented  Airway & Oxygen Therapy: Patient Spontanous Breathing and Patient connected to face mask oxygen  Post-op Assessment: Report given to RN and Post -op Vital signs reviewed and stable  Post vital signs: Reviewed and stable  Last Vitals:  Vitals Value Taken Time  BP 106/60 05/11/19 1040  Temp 36.4 C 05/11/19 1040  Pulse 79 05/11/19 1042  Resp 17 05/11/19 1042  SpO2 99 % 05/11/19 1042  Vitals shown include unvalidated device data.  Last Pain:  Vitals:   05/11/19 1040  TempSrc:   PainSc: (P) Asleep      Patients Stated Pain Goal: 3 (74/08/14 4818)  Complications: No apparent anesthesia complications

## 2019-05-11 NOTE — Anesthesia Preprocedure Evaluation (Signed)
Anesthesia Evaluation  Patient identified by MRN, date of birth, ID band Patient awake    Reviewed: Allergy & Precautions, NPO status , Patient's Chart, lab work & pertinent test results  Airway Mallampati: II  TM Distance: >3 FB Neck ROM: Full    Dental  (+) Dental Advisory Given   Pulmonary neg pulmonary ROS, asthma ,    Pulmonary exam normal breath sounds clear to auscultation       Cardiovascular negative cardio ROS Normal cardiovascular exam Rhythm:Regular Rate:Normal     Neuro/Psych  Headaches, PSYCHIATRIC DISORDERS Anxiety Depression    GI/Hepatic negative GI ROS, Neg liver ROS,   Endo/Other  Morbid obesity  Renal/GU negative Renal ROS  negative genitourinary   Musculoskeletal  (+) Arthritis , Fibromyalgia -  Abdominal (+) + obese,   Peds negative pediatric ROS (+)  Hematology negative hematology ROS (+) anemia ,   Anesthesia Other Findings   Reproductive/Obstetrics negative OB ROS                             Anesthesia Physical  Anesthesia Plan  ASA: III  Anesthesia Plan: General   Post-op Pain Management:    Induction: Intravenous  PONV Risk Score and Plan: 3 and Ondansetron, Dexamethasone and Midazolam  Airway Management Planned: Oral ETT  Additional Equipment:   Intra-op Plan:   Post-operative Plan: Extubation in OR  Informed Consent: I have reviewed the patients History and Physical, chart, labs and discussed the procedure including the risks, benefits and alternatives for the proposed anesthesia with the patient or authorized representative who has indicated his/her understanding and acceptance.     Dental advisory given  Plan Discussed with: CRNA  Anesthesia Plan Comments: (Pt reports father had a complication after surgery requiring reintubation. Pt has personally previously had GA without complication.   Stress echo 2010: OVERALL IMPRESSIONS  ECG  and echo images are normal at rest and with stress with no  evidence of CAD. Abnormal hemodynamic BP response (hypotension with  exercise) may be related to hyperdynamic LV dynamics but clinical  correlation is indicated   SUMMARY  Duration of exercise was 8 min. Target heart rate was achieved.  There was no chest pain during stress. Estimated left ventricular  ejection fraction was in the range of 55 % to 65 %.  )        Anesthesia Quick Evaluation

## 2019-05-12 ENCOUNTER — Encounter (HOSPITAL_COMMUNITY): Payer: Self-pay | Admitting: Neurosurgery

## 2019-05-12 LAB — BASIC METABOLIC PANEL
Anion gap: 8 (ref 5–15)
BUN: 8 mg/dL (ref 6–20)
CO2: 26 mmol/L (ref 22–32)
Calcium: 8.7 mg/dL — ABNORMAL LOW (ref 8.9–10.3)
Chloride: 103 mmol/L (ref 98–111)
Creatinine, Ser: 0.91 mg/dL (ref 0.44–1.00)
GFR calc Af Amer: >60 mL/min (ref >60)
GFR calc non Af Amer: >60 mL/min (ref >60)
Glucose, Bld: 133 mg/dL — ABNORMAL HIGH (ref 70–99)
Potassium: 4.4 mmol/L (ref 3.5–5.1)
Sodium: 137 mmol/L (ref 135–145)

## 2019-05-12 LAB — CBC
HCT: 38.5 % (ref 36.0–46.0)
Hemoglobin: 12.6 g/dL (ref 12.0–15.0)
MCH: 31 pg (ref 26.0–34.0)
MCHC: 32.7 g/dL (ref 30.0–36.0)
MCV: 94.8 fL (ref 80.0–100.0)
Platelets: 290 10*3/uL (ref 150–400)
RBC: 4.06 MIL/uL (ref 3.87–5.11)
RDW: 13.2 % (ref 11.5–15.5)
WBC: 13.9 10*3/uL — ABNORMAL HIGH (ref 4.0–10.5)
nRBC: 0 % (ref 0.0–0.2)

## 2019-05-12 MED ORDER — CYCLOBENZAPRINE HCL 10 MG PO TABS: 10.0000 mg | ORAL_TABLET | Freq: Three times a day (TID) | ORAL | 0 refills | Status: DC | PRN

## 2019-05-12 MED ORDER — HYDROMORPHONE HCL 4 MG PO TABS: 4.0000 mg | ORAL_TABLET | ORAL | 0 refills | Status: DC | PRN

## 2019-05-12 MED ORDER — DOCUSATE SODIUM 100 MG PO CAPS: 100.0000 mg | ORAL_CAPSULE | Freq: Two times a day (BID) | ORAL | 0 refills | Status: DC

## 2019-05-12 MED ORDER — OXYCODONE-ACETAMINOPHEN 5-325 MG PO TABS: 1.0000 | ORAL_TABLET | ORAL | Status: DC | PRN

## 2019-05-12 NOTE — Discharge Summary (Signed)
Physician Discharge Summary  Patient ID: Pam AmisBeverly T Sarabia MRN: [id number] DOB/AGE: [birth year] 43 y.o.  Admit date: 05/11/2019 Discharge date: 05/12/2019  Admission Diagnoses: Adult degenerative scoliosis, lumbar degenerative disease, lumbago  Discharge Diagnoses: The same Active Problems:   Degenerative scoliosis in adult patient   Discharged Condition: good  Hospital Course: I performed a L2-3 XLIF on the patient on 05/11/2019.  The surgery went well.  The patient's postoperative course was unremarkable.  On postoperative day #1 she already felt much better and requested discharge home.  She was given written and oral discharge instructions.  All her questions were answered.  Consults: Physical therapy, Occupational Therapy Significant Diagnostic Studies: None Treatments: L2-3 anterior lateral interbody arthrodesis and instrumentation Discharge Exam: Blood pressure (!) 134/94, pulse 84, temperature 97.7 F (36.5 C), temperature source Oral, resp. rate 18, height 5' 5.5" (1.664 m), weight 128.3 kg, last menstrual period 11/07/2012, SpO2 98 %. The patient is alert and pleasant.  She is moving her lower extremities well.  She looks well.  Disposition: Home   Allergies as of 05/12/2019      Reactions   Cashew Nut Oil Anaphylaxis   Cauliflower [brassica Oleracea Italica] Anaphylaxis   Whey Anaphylaxis   Augmentin [amoxicillin-pot Clavulanate] Diarrhea, Nausea And Vomiting   Has patient had a PCN reaction causing immediate rash, facial/tongue/throat swelling, SOB or lightheadedness with hypotension: No Has patient had a PCN reaction causing severe rash involving mucus membranes or skin necrosis: No Has patient had a PCN reaction that required hospitalization: No Has patient had a PCN reaction occurring within the last 10 years: Unknown If all of the above answers are "NO", then may proceed with Cephalosporin use.   Orange Fruit [citrus] Other (See Comments)   Migraines    Gluten  Meal Other (See Comments)   Severe GI upset   Milk-related Compounds    Other    epidural steroid injection - severe headaches, high fever   Codeine Rash   Tree Extract Itching   Tee tree oil      Medication List    STOP taking these medications   acetaminophen 500 MG tablet Commonly known as: TYLENOL     TAKE these medications   buPROPion 150 MG 24 hr tablet Commonly known as: WELLBUTRIN XL Take 2 tablets (300 mg total) by mouth daily.   busPIRone 10 MG tablet Commonly known as: BUSPAR Take 10 mg by mouth 2 (two) times daily.   butalbital-acetaminophen-caffeine 50-325-40 MG tablet Commonly known as: FIORICET Take 1 tablet by mouth every 6 (six) hours as needed for headache.   cyclobenzaprine 10 MG tablet Commonly known as: FLEXERIL Take 1 tablet (10 mg total) by mouth 3 (three) times daily as needed for muscle spasms.   diclofenac sodium 1 % Gel Commonly known as: VOLTAREN 3 grams to 3 large joints up to 3 times daily What changed:   how much to take  how to take this  when to take this  reasons to take this  additional instructions   docusate sodium 100 MG capsule Commonly known as: COLACE Take 1 capsule (100 mg total) by mouth 2 (two) times daily.   DULoxetine 30 MG capsule Commonly known as: Cymbalta Take 1 capsule (30 mg total) by mouth daily.   EPINEPHrine 0.3 mg/0.3 mL Devi Commonly known as: EpiPen Inject 0.3 mLs (0.3 mg total) into the muscle once. What changed:   when to take this  reasons to take this   gabapentin 300 MG capsule Commonly known  as: NEURONTIN Take 1 capsule (300 mg total) by mouth 2 (two) times daily.   HYDROmorphone 4 MG tablet Commonly known as: DILAUDID Take 1 tablet (4 mg total) by mouth every 4 (four) hours as needed for severe pain.   ProAir HFA 108 (90 Base) MCG/ACT inhaler Generic drug: albuterol Inhale 2 puffs into the lungs every 6 (six) hours as needed for wheezing.   trolamine salicylate 10 %  cream Commonly known as: ASPERCREME Apply 1 application topically as needed for muscle pain.            Durable Medical Equipment  (From admission, onward)         Start     Ordered   05/11/19 1902  For home use only DME 3 n 1  Once     05/11/19 1902   05/11/19 1902  For home use only DME Walker  Once    Question:  Patient needs a walker to treat with the following condition  Answer:  S/P lumbar spinal fusion   05/11/19 1902           Signed: Ophelia Charter 05/12/2019, 7:51 AM

## 2019-05-12 NOTE — Progress Notes (Signed)
OT Evaluation  Completed all education regarding compensatory strategies and use of AE to adhere to back precautions. Pt able to return demonstrate. Pt will need a 3in1 and RW for [facility]. Pt plans to purchase AE before DC. Pt asking questions regarding her Fibromyalgia. No further OT needs.     05/12/19 1000  OT Visit Information  Last OT Received On 05/12/19  Assistance Needed +1  History of Present Illness Pt admit for ANTERIOR LATERAL LUMBAR INTERBODY FUSION, LATERAL INSTRUMENTATION, RIGHT LUMBAR TWO- LUMBAR THREE.  Precautions  Precautions Fall;Back  Precaution Booklet Issued Yes (comment)  Required Braces or Orthoses Spinal Brace  Spinal Brace Applied in sitting position;Lumbar corset  Restrictions  Weight Bearing Restrictions No  Home Living  Family/patient expects to be discharged to: Private residence  Living Arrangements Spouse/significant other;Children  Available Help at Discharge Family;Available 24 hours/day  Type of Home House  Home Access Stairs to enter  Entrance Stairs-Number of Steps 6  Entrance Stairs-Rails Left  Home Layout One level  Bathroom Shower/Tub Walk-in Cytogeneticist Yes  How Accessible Accessible via walker  [place] - single point  Prior Function  Level of Independence Independent  Comments States husband would steady her at times; difficulty with hands from what she thinks is fibromyalgia  Communication  Communication No difficulties  Pain Assessment  Pain Assessment 0-10  Pain Score 4  Pain Location back, right LE  Pain Descriptors / Indicators Sore;Grimacing;Guarding;Radiating  Pain Intervention(s) Limited activity within patient's tolerance  Cognition  Arousal/Alertness Awake/alert  Behavior During Therapy WFL for tasks assessed/performed  Overall Cognitive Status Within Functional Limits for tasks assessed  Upper Extremity Assessment  Upper Extremity Assessment Generalized  weakness (mild weakness with hands; pa asking about exercises)  Lower Extremity Assessment  Lower Extremity Assessment Defer to PT evaluation (weaker more painful RLE)  Cervical / Trunk Assessment  Cervical / Trunk Assessment Other exceptions (back sx)  ADL  Overall ADL's  Needs assistance/impaired  Grooming Set up  Upper Body Bathing Set up;Sitting  Lower Body Bathing Supervison/ safety;Set up;Sit to/from stand;Adhering to back precautions;With adaptive equipment  Upper Body Dressing  Supervision/safety;Sitting  Lower Body Dressing Supervision/safety;Set up;Sit to/from stand;Adhering to back precautions;With adaptive equipment  Toilet Transfer Modified Independent;RW;Ambulation  Toileting - Clothing Manipulation Details (indicate cue type and reason) Educated on use of toilet tong for hygiene  Functional mobility during ADLs Modified independent  General ADL Comments Educated pt on useof AE for LB ADL adn hygiene after toileting  Bed Mobility  General bed mobility comments OOB in chair. REviewed bed mobility techniques  Transfers  Overall transfer level Modified independent  Equipment used Rolling walker (2 wheeled)  Balance  Sitting balance-Leahy Scale Good  Standing balance support Bilateral upper extremity supported;During functional activity  Standing balance-Leahy Scale Fair  Exercises  Exercises Other exercises  Other Exercises  Other Exercises hand intrinsic strengthening ex  Other Exercises encouraged [place] eo fparrafin on hands for pain control  OT - End of Session  Equipment Utilized During Treatment Rolling walker;Back brace  Activity Tolerance Patient tolerated treatment well  Patient left in chair;with call bell/phone within reach  Nurse Communication Mobility status (DC needs)  OT Assessment  OT Visit Diagnosis Unsteadiness on feet (R26.81);Muscle weakness (generalized) (M62.81);Pain  Pain - part of body  (back)  OT Problem List Decreased strength;Decreased  range of motion;Decreased activity tolerance;Impaired balance (sitting and/or standing);Decreased safety awareness;Decreased knowledge of use of DME or AE;Decreased knowledge of precautions;Obesity;Pain  AM-PAC OT "6 Clicks" Daily Activity Outcome Measure (Version 2)  Help from another person eating meals? 4  Help from another person taking care of personal grooming? 3  Help from another person toileting, which includes using toliet, bedpan, or urinal? 3  Help from another person bathing (including washing, rinsing, drying)? 3  Help from another person to put on and taking off regular upper body clothing? 3  Help from another person to put on and taking off regular lower body clothing? 3  6 Click Score 19  OT Recommendation  Follow Up Recommendations No OT follow up;Supervision - Intermittent  OT Equipment 3 in 1 bedside commode  Acute Rehab OT Goals  Patient Stated Goal to go home  OT Goal Formulation All assessment and education complete, DC therapy  OT Time Calculation  OT Start Time (ACUTE ONLY) 0837  OT Stop Time (ACUTE ONLY) 0915  OT Time Calculation (min) 38 min  OT General Charges  $OT Visit 1 Visit  OT Evaluation  $OT Eval Low Complexity 1 Low  OT Treatments  $Self Care/Home Management  23-37 mins  Written Expression  Dominant Hand Right  [name], OT/L   Acute OT Clinical Specialist Acute Rehabilitation Services Pager [phone] Office [phone]

## 2019-05-12 NOTE — Progress Notes (Signed)
Physical Therapy Treatment and Discharge Patient Details Name: [name] MRN: [id number] DOB: Oct 21, 1976 Today's Date: 05/12/2019    History of Present Illness Pt admit for ANTERIOR LATERAL LUMBAR INTERBODY FUSION, LATERAL INSTRUMENTATION, RIGHT LUMBAR TWO- LUMBAR THREE.    PT Comments    Pt progressing well with post-op mobility and has met acute PT goals. Pt was extensively educated on precautions, brace, car transfer, and activity progression as pt had many questions. Pt is grossly functioning at a supervision level with a RW for support. Will sign off at this time, if needs change, please reconsult.     Follow Up Recommendations  No PT follow up;Supervision/Assistance - 24 hour     Equipment Recommendations  Rolling walker with 5" wheels;3in1 (PT)    Recommendations for Other Services       Precautions / Restrictions Precautions Precautions: Fall;Back Precaution Booklet Issued: Yes (comment) Precaution Comments: Reviewed precautions during functional mobility Required Braces or Orthoses: Spinal Brace Spinal Brace: Applied in sitting position;Lumbar corset Restrictions Weight Bearing Restrictions: No    Mobility  Bed Mobility               General bed mobility comments: Pt was received sitting up in recliner. Reviewed log roll technique verbally.   Transfers Overall transfer level: Needs assistance Equipment used: Rolling walker (2 wheeled) Transfers: Sit to/from Stand Sit to Stand: Supervision         General transfer comment: No assist required. Pt demonstrated proper hand placement on seated surface for safety.   Ambulation/Gait Ambulation/Gait assistance: Supervision Gait Distance (Feet): 350 Feet Assistive device: Rolling walker (2 wheeled) Gait Pattern/deviations: Step-through pattern;Decreased stride length Gait velocity: Decreased Gait velocity interpretation: <1.8 ft/sec, indicate of risk for recurrent falls General Gait Details: Slow  but generally steady with the RW. Pt managed walker well around turns.    Stairs Stairs: Yes Stairs assistance: Min guard;Supervision Stair Management: Step to pattern;Forwards;One rail Left Number of Stairs: 6 General stair comments: VC's for sequencing and general safety. No assist required however close guard provided for safety.   Wheelchair Mobility    Modified Rankin (Stroke Patients Only)       Balance Overall balance assessment: Needs assistance Sitting-balance support: No upper extremity supported;Feet supported Sitting balance-Leahy Scale: Fair     Standing balance support: Bilateral upper extremity supported;During functional activity Standing balance-Leahy Scale: Poor Standing balance comment: relies on UE suport for balance                            Cognition Arousal/Alertness: Awake/alert Behavior During Therapy: WFL for tasks assessed/performed Overall Cognitive Status: Within Functional Limits for tasks assessed                                        Exercises      General Comments        Pertinent Vitals/Pain Pain Assessment: Faces Faces Pain Scale: Hurts a little bit Pain Location: back, right LE Pain Descriptors / Indicators: Operative site guarding Pain Intervention(s): Monitored during session    Home Living                      Prior Function            PT Goals (current goals can now be found in the care plan section) Acute Rehab PT Goals  Patient Stated Goal: to go home PT Goal Formulation: With patient Time For Goal Achievement: 05/25/19 Potential to Achieve Goals: Good Progress towards PT goals: Progressing toward goals    Frequency    Min 5X/week      PT Plan Current plan remains appropriate    Co-evaluation              AM-PAC PT "6 Clicks" Mobility   Outcome Measure  Help needed turning from your back to your side while in a flat bed without using bedrails?: A  Little Help needed moving from lying on your back to sitting on the side of a flat bed without using bedrails?: A Little Help needed moving to and from a bed to a chair (including a wheelchair)?: A Little Help needed standing up from a chair using your arms (e.g., wheelchair or bedside chair)?: A Little Help needed to walk in hospital room?: A Little Help needed climbing 3-5 steps with a railing? : A Little 6 Click Score: 18    End of Session Equipment Utilized During Treatment: Gait belt;Back brace Activity Tolerance: Patient limited by fatigue Patient left: in chair;with call bell/phone within reach Nurse Communication: Mobility status PT Visit Diagnosis: Unsteadiness on feet (R26.81);Muscle weakness (generalized) (M62.81);Pain Pain - Right/Left: Right Pain - part of body: Leg(back)     Time: 0634-9494 PT Time Calculation (min) (ACUTE ONLY): 31 min  Charges:  $Gait Training: 23-37 mins                     [name], PT, DPT Acute Rehabilitation Services Pager: [phone] Office: [phone]    [name] 05/12/2019, 10:22 AM

## 2019-05-12 NOTE — Plan of Care (Signed)
Patient alert and oriented, mae's well, voiding adequate amount of urine, swallowing without difficulty, no c/o pain at time of discharge. Patient discharged home with family. Script and discharged instructions given to patient. Patient and family stated understanding of instructions given. Patient has an appointment with Dr. Jenkins   

## 2019-05-18 ENCOUNTER — Encounter: Payer: Self-pay | Admitting: Family Medicine

## 2019-05-19 LAB — CBC WITH DIFFERENTIAL/PLATELET
Absolute Monocytes: 573 {cells}/uL (ref 200–950)
Basophils Absolute: 55 {cells}/uL (ref 0–200)
Basophils Relative: 0.6 %
Eosinophils Absolute: 1338 {cells}/uL — ABNORMAL HIGH (ref 15–500)
Eosinophils Relative: 14.7 %
HCT: 36.4 % (ref 35.0–45.0)
Hemoglobin: 12.2 g/dL (ref 11.7–15.5)
Lymphs Abs: 1902 {cells}/uL (ref 850–3900)
MCH: 31.7 pg (ref 27.0–33.0)
MCHC: 33.5 g/dL (ref 32.0–36.0)
MCV: 94.5 fL (ref 80.0–100.0)
MPV: 10.6 fL (ref 7.5–12.5)
Monocytes Relative: 6.3 %
Neutro Abs: 5233 {cells}/uL (ref 1500–7800)
Neutrophils Relative %: 57.5 %
Platelets: 297 Thousand/uL (ref 140–400)
RBC: 3.85 Million/uL (ref 3.80–5.10)
RDW: 13.4 % (ref 11.0–15.0)
Total Lymphocyte: 20.9 %
WBC: 9.1 Thousand/uL (ref 3.8–10.8)

## 2019-05-26 ENCOUNTER — Encounter: Payer: Self-pay | Admitting: Gastroenterology

## 2019-05-26 ENCOUNTER — Telehealth: Payer: Self-pay | Admitting: Gastroenterology

## 2019-05-26 ENCOUNTER — Other Ambulatory Visit: Payer: Self-pay

## 2019-05-26 ENCOUNTER — Ambulatory Visit (INDEPENDENT_AMBULATORY_CARE_PROVIDER_SITE_OTHER): Payer: BC Managed Care – PPO | Admitting: Gastroenterology

## 2019-05-26 VITALS — BP 133/88 | HR 102 | Temp 96.8°F | Ht 65.0 in | Wt 286.8 lb

## 2019-05-26 DIAGNOSIS — D721 Eosinophilia, unspecified: Secondary | ICD-10-CM

## 2019-05-26 DIAGNOSIS — D509 Iron deficiency anemia, unspecified: Secondary | ICD-10-CM

## 2019-05-26 NOTE — Progress Notes (Signed)
Primary Care Physician: Dianne DunAron, Talia M, MD  Primary Gastroenterologist:  Roetta SessionsMichael Rourk, MD   Chief Complaint  Patient presents with   pp f/u    gluten causes diarrhea    HPI: [name] is a 43 y.o. female here for follow-up.  She was initially seen back in March 2020 at the request of Dr. 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  Band had been originally placed in 2011.  She was required to have several iron infusions with the last 2 in September 2019.  Oral B12 for B12 deficiency.  Celiac serologies negative in September 2019.  Patient underwent EGD and colonoscopy followed by capsule endoscopy Mar 16, 2019.  Large hiatal hernia present, couple of Sheria LangCameron lesions noted, couple of antral erosions.  Few erosions found in the duodenal bulb.  Gastric biopsies with mild reactive gastropathy, mild chronic gastritis.  No H. pylori.  Colonoscopy she had normal terminal ileoscopy up to 15 cm, normal colon.  Next colonoscopy in 10 years.  Capsule showed possibly ampulla but cannot exclude periampullary adenoma, blood noted further downstream small bowel possibly related upper GI tract from suctioning/gastric biopsy.  No associated lesion identified.  Potential small nonbleeding small bowel ulcer noted as well.  Small bowel enteroscopy performed due to formed, and the proximal jejunum there were 3 areas of ulceration, multiple erosions in the posterior bulb and second portion duodenum, no polyp or tumor seen.  Good examination of the ampulla performed, no neoplasm seen.  Attempted to biopsy jejunal lesions however the scope fell back and was unable to reidentify the ulcers documented.  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  She had a repeat CBC last week  with normal hemoglobin 12.2, normal white blood cell count of 9100 and eosinophil count was elevated at 1338.  Peripheral smear could not be added on.  H. pylori stool antigen was negative.  Son has celiac. Daughter likely does, started having symptoms at age 42[age]. Put her on gluten free diet and refused to submit her to gluten challenge for testing. After TCS patient found that she no longer tolerates gluten herself. Her son has been home from college since 12/2018 so everything had been switched to gluten free except she continued to eat toast and peanut butter for breakfast. After her TCS, she had to stop that due to diarrhea. Last gluten exposure for sure 03/2019. No small bowel biopsies performed at time of EGD or enteroscopy.  Since back surgery. 2-3 watery stools per day. No melena, brbpr. Started five days after surgery. Stopped the colace two days ago. No abdominal pain or bloating. No UGI symptoms.   Current Outpatient Medications  Medication Sig Dispense Refill   albuterol (PROAIR HFA) 108 (90 BASE) MCG/ACT inhaler Inhale 2 puffs into the lungs every 6 (six) hours as needed for wheezing.     buPROPion (WELLBUTRIN XL) 150 MG 24 hr tablet Take 2 tablets (300 mg total) by mouth daily. 60 tablet 3   busPIRone (BUSPAR) 10 MG tablet Take 10 mg by mouth 2 (two) times daily.      butalbital-acetaminophen-caffeine (FIORICET, ESGIC) 50-325-40 MG tablet Take 1 tablet by mouth every 6 (six) hours as needed for headache.      cyclobenzaprine (FLEXERIL)  10 MG tablet Take 1 tablet (10 mg total) by mouth 3 (three) times daily as needed for muscle spasms. 50 tablet 0   diclofenac sodium (VOLTAREN) 1 % GEL 3 grams to 3 large joints up to 3 times daily (Patient taking differently: Apply 3 g topically 3 (three) times daily as needed (pain). 3 grams to 3 large joints up to 3 times daily as needed) 3 Tube 3   DULoxetine (CYMBALTA) 30 MG capsule Take 1 capsule (30 mg total) by mouth daily. 30 capsule 3    EPINEPHrine (EPIPEN) 0.3 mg/0.3 mL DEVI Inject 0.3 mLs (0.3 mg total) into the muscle once. (Patient taking differently: Inject 0.3 mg into the muscle as needed (anaphylaxis). ) 1 Device 0   gabapentin (NEURONTIN) 300 MG capsule Take 1 capsule (300 mg total) by mouth 2 (two) times daily. 60 capsule 3   HYDROmorphone (DILAUDID) 4 MG tablet Take 1 tablet (4 mg total) by mouth every 4 (four) hours as needed for severe pain. 30 tablet 0   trolamine salicylate (ASPERCREME) 10 % cream Apply 1 application topically as needed for muscle pain.     No current facility-administered medications for this visit.     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 ENT: Negative for hoarseness, difficulty swallowing , nasal congestion. CV: Negative for chest pain, angina, palpitations, dyspnea on exertion, peripheral edema.  Respiratory: Negative for dyspnea at rest, dyspnea on exertion, cough, sputum, wheezing.  GI: See history of present illness. GU:  Negative for dysuria, hematuria, urinary incontinence, urinary frequency, nocturnal urination.  Endo: Negative for unusual weight change.    Physical Examination:   BP 133/88    Pulse (!) 102    Temp (!) 96.8 F (36 C) (Oral)    Ht 5\' 5"  (1.651 m)    Wt 286 lb 12.8 oz (130.1 kg)    LMP 11/07/2012    BMI 47.73 kg/m   General: Well-nourished, well-developed in no acute distress.  Eyes: No  icterus. Abdomen: Bowel sounds are normal, nontender, nondistended, no hepatosplenomegaly or masses, no abdominal bruits or hernia , no rebound or guarding.   Extremities: No lower extremity edema. No clubbing or deformities. Neuro: Alert and oriented x 4   Skin: Warm and dry, no jaundice.   Psych: Alert and cooperative, normal mood and affect.  Labs:  Lab Results  Component Value Date   WBC 9.1 05/19/2019   HGB 12.2 05/19/2019   HCT 36.4 05/19/2019   MCV 94.5 05/19/2019   PLT 297 05/19/2019   Lab Results  Component Value Date   IRON 79 10/20/2018   TIBC 306 10/20/2018   FERRITIN 108 02/24/2019    Imaging Studies: Dg Lumbar Spine 2-3 Views  Result Date: 05/11/2019 CLINICAL DATA:  Portable imaging for L2-L3 interbody fusion. EXAM: DG C-ARM 61-120 MIN; LUMBAR SPINE - 2-3 VIEW COMPARISON:  None. FINDINGS: Two submitted portable images show placement of interbody fusion hardware, supported by a right lateral fixation plate and  2 screws extending across the lower vertebral body of L2 in the upper vertebral body of L3. The orthopedic hardware appears well seated and well-positioned. IMPRESSION: Portable imaging provided for interbody fusion at L2-L3. Electronically Signed   By: Amie Portlandavid  Ormond M.D.   On: 05/11/2019 10:32   Dg C-arm 1-60 Min  Result Date: 05/11/2019 CLINICAL DATA:  Portable imaging for L2-L3 interbody fusion. EXAM: DG C-ARM 61-120 MIN; LUMBAR SPINE - 2-3 VIEW COMPARISON:  None. FINDINGS: Two submitted portable images show placement of interbody fusion hardware, supported by a right lateral fixation plate and 2 screws extending across the lower vertebral body of L2 in the upper vertebral body of L3. The orthopedic hardware appears well seated and well-positioned. IMPRESSION: Portable imaging provided for interbody fusion at L2-L3. Electronically Signed   By: Amie Portlandavid  Ormond M.D.   On: 05/11/2019 10:32

## 2019-05-26 NOTE — Telephone Encounter (Signed)
Please NIC for TCS with RMR in 02/2029.

## 2019-05-26 NOTE — Assessment & Plan Note (Signed)
Pleasant 43 year old female with history of iron deficiency anemia, family history of celiac disease, who presents for follow-up.  EGD, colonoscopy, capsule endoscopy as outlined above.  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  No H. pylori.  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  Small bowel enteroscopy showed 3 areas of ulceration and possible, unfortunately these cannot be biopsied due to slip scope and lesions cannot be reidentified.  Ampulla looked normal.  Previous celiac serologies negative.  Patient states her son has well-documented celiac disease, reports that his serologies were negative but was confirmed by biopsy.  Daughter has had similar symptoms since age 79 the patient opted for gluten-free diet did not want to challenge her in order to take diagnosis.  Since her procedures, patient has noted increased difficulty tolerating gluten.  Her March to June she had eliminated all gluten except for a single piece of toast at breakfast (because her son returned college and is very sensitive to cross-contamination).  Since June she eliminated all gluten from her diet.  Currently having some self-limiting diarrhea since her back surgery earlier this month.  She stopped Colace which was prescribed by her surgeon.  Last dose 2 days ago.  Will monitor but if persistent diarrhea, check for C. difficile due to recent antibiotic exposure and hospitalization.  Recent CBC with eosinophilia noted.  Plan to update today along with pathology smear.  Follow-up iron studies as well.

## 2019-05-26 NOTE — Patient Instructions (Signed)
1. Labs as planned. We will touch base with you as results available.  2. Let me know if diarrhea persists and we will check you for C. Diff.

## 2019-05-27 LAB — CBC (INCLUDES DIFF/PLT) WITH PATHOLOGIST REVIEW
Absolute Monocytes: 602 cells/uL (ref 200–950)
Basophils Absolute: 103 cells/uL (ref 0–200)
Basophils Relative: 1.2 %
Eosinophils Absolute: 920 cells/uL — ABNORMAL HIGH (ref 15–500)
Eosinophils Relative: 10.7 %
HCT: 41.7 % (ref 35.0–45.0)
Hemoglobin: 13.9 g/dL (ref 11.7–15.5)
Lymphs Abs: 1737 cells/uL (ref 850–3900)
MCH: 31.8 pg (ref 27.0–33.0)
MCHC: 33.3 g/dL (ref 32.0–36.0)
MCV: 95.4 fL (ref 80.0–100.0)
MPV: 10.7 fL (ref 7.5–12.5)
Monocytes Relative: 7 %
Neutro Abs: 5237 cells/uL (ref 1500–7800)
Neutrophils Relative %: 60.9 %
Platelets: 304 10*3/uL (ref 140–400)
RBC: 4.37 10*6/uL (ref 3.80–5.10)
RDW: 14 % (ref 11.0–15.0)
Total Lymphocyte: 20.2 %
WBC: 8.6 10*3/uL (ref 3.8–10.8)

## 2019-05-27 LAB — IRON,TIBC AND FERRITIN PANEL
%SAT: 27 % (calc) (ref 16–45)
Ferritin: 182 ng/mL (ref 16–232)
Iron: 79 ug/dL (ref 40–190)
TIBC: 288 mcg/dL (calc) (ref 250–450)

## 2019-05-28 NOTE — Progress Notes (Signed)
cc'd to pcp 

## 2019-05-28 NOTE — Telephone Encounter (Signed)
ON RECALL FOR 10 YR TCS

## 2019-06-04 ENCOUNTER — Other Ambulatory Visit: Payer: Self-pay

## 2019-06-04 DIAGNOSIS — D509 Iron deficiency anemia, unspecified: Secondary | ICD-10-CM

## 2019-06-05 ENCOUNTER — Encounter: Payer: Self-pay | Admitting: Family Medicine

## 2019-06-18 NOTE — Progress Notes (Signed)
Office Visit Note  Patient: [name]             Date of Birth: September 17, 1976           MRN: [id number]             PCP: Dianne DunAron, Talia M, MD Referring: Dianne DunAron, Talia M, MD Visit Date: 07/02/2019 Occupation: @GUAROCC @  Subjective:  Pain in left foot.   History of Present Illness: [name] is a 43 y.o. female with history of osteoarthritis, degenerative disc disease and myofascial pain syndrome.  She states she has been having pain and discomfort in the base of left fifth toe.  There is no history of swelling.  She continues to have some swelling in her both knees.  The left knee joint is worse.  She continues to have some discomfort in her hands.  She states she had lumbar spine fusion in July 2020 by Dr. Lovell SheehanJenkins.  She is doing well after the surgery.  She was also diagnosed with gluten sensitivity lately.  She is on gluten-free diet now.  Activities of Daily Living:  Patient reports morning stiffness for 10 minutes.   Patient Reports nocturnal pain.  Difficulty dressing/grooming: Denies Difficulty climbing stairs: Reports Difficulty getting out of chair: Denies Difficulty using hands for taps, buttons, cutlery, and/or writing: Reports  Review of Systems  Constitutional: Positive for fatigue.  HENT: Negative for mouth sores, mouth dryness and nose dryness.   Eyes: Negative for itching and dryness.  Respiratory: Negative for shortness of breath, wheezing and difficulty breathing.   Cardiovascular: Negative for chest pain and palpitations.  Gastrointestinal: Negative for blood in stool, constipation and diarrhea.  Endocrine: Negative for increased urination.  Genitourinary: Negative for difficulty urinating and painful urination.  Musculoskeletal: Positive for arthralgias, joint pain, joint swelling and morning stiffness.  Skin: Negative for rash and redness.  Allergic/Immunologic: Negative for susceptible to infections.  Neurological: Positive for headaches. Negative for  dizziness, memory loss and weakness.  Hematological: Negative for bruising/bleeding tendency.  Psychiatric/Behavioral: Positive for sleep disturbance. Negative for confusion.    PMFS History:  Patient Active Problem List   Diagnosis Date Noted  . Degenerative scoliosis in adult patient 05/11/2019  . Anxiety and depression 04/08/2019  . Fibromyalgia 04/08/2019  . Primary osteoarthritis of both knees 09/17/2018  . Primary osteoarthritis of both hands 09/17/2018  . DDD (degenerative disc disease), lumbar 08/27/2018  . Family history of rheumatoid arthritis 08/27/2018  . Flat foot 08/27/2018  . Multiple food allergies 07/28/2018  . Family history of celiac disease 07/28/2018  . Iron deficiency anemia 02/07/2018  . History of removal of laparoscopic gastric banding device 02/03/2018  . Anemia 01/13/2018  . H/O laparoscopic adjustable gastric banding 07/29/2013  . Bilateral ovarian cysts 11/13/2012  . Major depression, recurrent, chronic (HCC) 01/22/2011  . Decreased sex drive 40/98/119103/26/2012  . Morbid obesity (HCC) 02/13/2010  . OTHER MALAISE AND FATIGUE 12/20/2008    Past Medical History:  Diagnosis Date  . Anemia   . Arthritis   . Asthma    EXERCISE INDUCED  . Bursitis of hip   . Depression   . Family history of adverse reaction to anesthesia    patient states father had a reaction to anesthesia about 20 years ago where his face and throat swelled, had to be reintubated."  . Generalized headaches   . Irregular menses   . Obesity (BMI 30-39.9)     Family History  Problem Relation Age of Onset  .  Heart disease Maternal Grandmother        V. Fib arrest  . Hypertension Mother   . Hyperlipidemia Mother   . Diabetes Mother        pre-diabetes  . Hypertension Father   . Hyperlipidemia Father   . Heart disease Father   . Diabetes Father   . Cancer Father        skin  . Diabetes Sister   . Hypertension Sister   . Cancer Maternal Grandfather        lung and skin  . Colon  cancer Maternal Grandfather   . Cancer Paternal Grandfather        small bowel cancer and skin  . Pleurisy Son   . Migraines Son   . Colon polyps Other        aunt and uncle   Past Surgical History:  Procedure Laterality Date  . ABDOMINAL HYSTERECTOMY    . ACHILLES TENDON LENGTHENING  1989  . ANKLE GANGLION CYST EXCISION  02/2012   left ankle  . ANTERIOR LAT LUMBAR FUSION Right 05/11/2019   Procedure: ANTERIOR LATERAL LUMBAR INTERBODY FUSION, LATERAL INSTRUMENTATION, RIGHT LUMBAR TWO- LUMBAR THREE;  Surgeon: Tressie StalkerJenkins, Jeffrey, MD;  [facility];  Service: Neurosurgery;  Laterality: Right;  ANTERIOR LATERAL LUMBAR INTERBODY FUSION, LATERAL INSTRUMENTATION, RIGHT LUMBAR TWO- LUMBAR THREE  . BIOPSY  03/16/2019   Procedure: BIOPSY;  Surgeon: Corbin Adeourk, Robert M, MD;  Location: [facility];  Service: Endoscopy;;  Gastric   . COLONOSCOPY WITH PROPOFOL N/A 03/16/2019   Dr. Jena Gaussourk: Normal terminal ileum, 15 cm.  Normal colon.  Next colonoscopy 10 years  . DILATION AND CURETTAGE OF UTERUS  2003  . ENTEROSCOPY N/A 04/02/2019   Dr. Jena Gaussourk: bulbar erosions, jejunal ulcerations, attempted biopsy but scope fell backwards and lesions cannot be reidentified.  No evidence of ampullary lesion or mass.  . ESOPHAGOGASTRODUODENOSCOPY (EGD) WITH PROPOFOL N/A 03/16/2019   Dr. Jena Gaussourk: Large hiatal hernia, Cameron/antral erosions, duodenal erosions.  Gastric biopsy showed reactive gastropathy, mild chronic gastritis, negative for H. pylori  . FEET SURGERY  1990   INSERTION OF BONE GRAFT IN FEET  . GIVENS CAPSULE STUDY N/A 03/16/2019   Procedure: GIVENS CAPSULE STUDY;  Surgeon: Corbin Adeourk, Robert M, MD;  Location: [facility];  Service: Endoscopy;  Laterality: N/A;  . LAPAROSCOPIC GASTRIC BANDING  10/17/10   Dr Gaynelle AduEric Wilson; [facility]  . LAPAROSCOPIC REPAIR AND REMOVAL OF GASTRIC BAND  01/2018  . LIPOMA EXCISION  02/2006   abdomen   Social History   Social History Narrative  . Not on file   Immunization History   Administered Date(s) Administered  . Influenza,inj,quad, With Preservative 08/29/2017  . Tdap 01/13/2018     Objective: Vital Signs: BP 138/88 (BP Location: Left Wrist, Patient Position: Sitting, Cuff Size: Normal)   Pulse 93   Resp 15   Ht 5\' 5"  (1.651 m)   Wt 277 lb 9.6 oz (125.9 kg)   LMP 11/07/2012   BMI 46.20 kg/m    Physical Exam Vitals signs and nursing note reviewed.  Constitutional:      Appearance: She is well-developed.  HENT:     Head: Normocephalic and atraumatic.  Eyes:     Conjunctiva/sclera: Conjunctivae normal.  Neck:     Musculoskeletal: Normal range of motion.  Cardiovascular:     Rate and Rhythm: Normal rate and regular rhythm.     Heart sounds: Normal heart sounds.  Pulmonary:     Effort: Pulmonary effort is  normal.     Breath sounds: Normal breath sounds.  Abdominal:     General: Bowel sounds are normal.     Palpations: Abdomen is soft.  Lymphadenopathy:     Cervical: No cervical adenopathy.  Skin:    General: Skin is warm and dry.     Capillary Refill: Capillary refill takes less than 2 seconds.  Neurological:     Mental Status: She is alert and oriented to person, place, and time.  Psychiatric:        Behavior: Behavior normal.      Musculoskeletal Exam: C-spine thoracic and lumbar spine with good range of motion.  Shoulder joints elbow joints and wrist joints with good range of motion.  She has no synovitis over MCPs PIPs or DIPs.  Hip joints, knee joints are in good range of motion.  She has slight warmth on palpation of her left knee.  She has bilateral pes planus.  Callus was noted over the medial aspect of her left first MTP.  There is protrusion of the left fifth MTP.  CDAI Exam: CDAI Score: - Patient Global: -; Provider Global: - Swollen: -; Tender: - Joint Exam   No joint exam has been documented for this visit   There is currently no information documented on the homunculus. Go to the Rheumatology activity and complete the  homunculus joint exam.  Investigation: No additional findings.  Imaging: No results found.  Recent Labs: Lab Results  Component Value Date   WBC 8.6 05/26/2019   HGB 13.9 05/26/2019   PLT 304 05/26/2019   NA 137 05/12/2019   K 4.4 05/12/2019   CL 103 05/12/2019   CO2 26 05/12/2019   GLUCOSE 133 (H) 05/12/2019   BUN 8 05/12/2019   CREATININE 0.91 05/12/2019   BILITOT 0.5 02/24/2019   ALKPHOS 83 02/24/2019   AST 17 02/24/2019   ALT 22 02/24/2019   PROT 7.6 02/24/2019   ALBUMIN 4.0 02/24/2019   CALCIUM 8.7 (L) 05/12/2019   GFRAA >60 05/12/2019    Speciality Comments: No specialty comments available.  Procedures:  No procedures performed Allergies: Cashew nut oil, Cauliflower [brassica oleracea italica], Whey, Augmentin [amoxicillin-pot clavulanate], Orange fruit [citrus], Gluten meal, Milk-related compounds, Other, Codeine, and Tree extract   Assessment / Plan:     Visit Diagnoses: Primary osteoarthritis of both hands-she has some osteoarthritic changes in her hands but no swelling was noted.  She gives history of intermittent swelling in her hands.  Primary osteoarthritis of both knees - Moderate OA and moderate chondromalacia patella.  She has been having increased discomfort in her knee joints.  No swelling or effusion was noted.  She has some warmth in her left knee.  She requests a prescription for Pennsaid.  Prescription was called in.  Side effects were reviewed.  Pes planus of both feet-she has bilateral severe pes planus.  The need for wearing proper orthotics was discussed.  Pain in left foot-she has a callus on the medial aspect of her left first MTP and protuberance of her left fifth MTP.  It is related to pes planus.  Myofascial pain-she has been experiencing some generalized pain.  She states that Cymbalta was recently added and she will be discussing with her PCP about increasing Cymbalta.  DDD (degenerative disc disease), lumbar-she had lumbar spine fusion  in July 2020 by Dr. Arnoldo Morale and is doing much better now.  Positive ANA (antinuclear antibody) - ANA 1: 320 speckled, ENA negative.  Patient has no clinical features  of autoimmune disease.   Family history of rheumatoid arthritis  Family history of celiac disease-she has been recently diagnosed with non-celiac gluten sensitivity.  She has been on gluten-free diet.  Anxiety and depression  History of asthma  Bilateral ovarian cysts  History of anemia  Orders: No orders of the defined types were placed in this encounter.  Meds ordered this encounter  Medications  . Diclofenac Sodium (PENNSAID) 2 % SOLN    Sig: Place 2 application onto the skin 2 (two) times daily.    Dispense:  112 g    Refill:  2    Face-to-face time spent with patient was 25 minutes. Greater than 50% of time was spent in counseling and coordination of care.  Follow-Up Instructions: Return in about 6 months (around 12/30/2019) for OA, DDD, myofascial pain syndrome.   Pollyann SavoyShaili Nolan Lasser, MD  Note - This record has been created using Animal nutritionistDragon software.  Chart creation errors have been sought, but may not always  have been located. Such creation errors do not reflect on  the standard of medical care.

## 2019-06-26 ENCOUNTER — Encounter (HOSPITAL_COMMUNITY): Payer: Self-pay

## 2019-06-26 ENCOUNTER — Other Ambulatory Visit (HOSPITAL_COMMUNITY): Payer: Self-pay | Admitting: Nurse Practitioner

## 2019-07-02 ENCOUNTER — Ambulatory Visit (INDEPENDENT_AMBULATORY_CARE_PROVIDER_SITE_OTHER): Payer: BC Managed Care – PPO | Admitting: Rheumatology

## 2019-07-02 ENCOUNTER — Encounter: Payer: Self-pay | Admitting: Rheumatology

## 2019-07-02 ENCOUNTER — Other Ambulatory Visit: Payer: Self-pay

## 2019-07-02 VITALS — BP 138/88 | HR 93 | Resp 15 | Ht 65.0 in | Wt 277.6 lb

## 2019-07-02 DIAGNOSIS — M19042 Primary osteoarthritis, left hand: Secondary | ICD-10-CM

## 2019-07-02 DIAGNOSIS — M5136 Other intervertebral disc degeneration, lumbar region: Secondary | ICD-10-CM

## 2019-07-02 DIAGNOSIS — F329 Major depressive disorder, single episode, unspecified: Secondary | ICD-10-CM

## 2019-07-02 DIAGNOSIS — F32A Depression, unspecified: Secondary | ICD-10-CM

## 2019-07-02 DIAGNOSIS — N83201 Unspecified ovarian cyst, right side: Secondary | ICD-10-CM

## 2019-07-02 DIAGNOSIS — Z8709 Personal history of other diseases of the respiratory system: Secondary | ICD-10-CM

## 2019-07-02 DIAGNOSIS — M170 Bilateral primary osteoarthritis of knee: Secondary | ICD-10-CM

## 2019-07-02 DIAGNOSIS — M19041 Primary osteoarthritis, right hand: Secondary | ICD-10-CM | POA: Diagnosis not present

## 2019-07-02 DIAGNOSIS — M7918 Myalgia, other site: Secondary | ICD-10-CM

## 2019-07-02 DIAGNOSIS — M2142 Flat foot [pes planus] (acquired), left foot: Secondary | ICD-10-CM

## 2019-07-02 DIAGNOSIS — M2141 Flat foot [pes planus] (acquired), right foot: Secondary | ICD-10-CM | POA: Diagnosis not present

## 2019-07-02 DIAGNOSIS — R768 Other specified abnormal immunological findings in serum: Secondary | ICD-10-CM

## 2019-07-02 DIAGNOSIS — Z862 Personal history of diseases of the blood and blood-forming organs and certain disorders involving the immune mechanism: Secondary | ICD-10-CM

## 2019-07-02 DIAGNOSIS — Z8261 Family history of arthritis: Secondary | ICD-10-CM

## 2019-07-02 DIAGNOSIS — Z8379 Family history of other diseases of the digestive system: Secondary | ICD-10-CM

## 2019-07-02 DIAGNOSIS — M79672 Pain in left foot: Secondary | ICD-10-CM

## 2019-07-02 DIAGNOSIS — K9041 Non-celiac gluten sensitivity: Secondary | ICD-10-CM

## 2019-07-02 DIAGNOSIS — N83202 Unspecified ovarian cyst, left side: Secondary | ICD-10-CM

## 2019-07-02 DIAGNOSIS — F419 Anxiety disorder, unspecified: Secondary | ICD-10-CM

## 2019-07-02 MED ORDER — PENNSAID 2 % TD SOLN: 2.0000 "application " | Freq: Two times a day (BID) | TRANSDERMAL | 2 refills | Status: DC

## 2019-07-06 ENCOUNTER — Encounter: Payer: Self-pay | Admitting: Family Medicine

## 2019-07-08 MED ORDER — DULOXETINE HCL 60 MG PO CPEP: 60.0000 mg | ORAL_CAPSULE | Freq: Every day | ORAL | 3 refills | Status: DC

## 2019-07-08 NOTE — Progress Notes (Signed)
Virtual Visit via Video   Due to the COVID-19 pandemic, this visit was completed with telemedicine (audio/video) technology to reduce patient and provider exposure as well as to preserve personal protective equipment.   I connected with [name] by a video enabled telemedicine application and verified that I am speaking with the correct person using two identifiers. Location patient: Home Location provider: [facility], Office Persons participating in the virtual visit: [name], Arnette Norris, MD   I discussed the limitations of evaluation and management by telemedicine and the availability of in person appointments. The patient expressed understanding and agreed to proceed.  Care Team   Patient Care Team: Lucille Passy, MD as PCP - General (Family Medicine) Danie Binder, MD as Consulting Physician (Gastroenterology)  Subjective:   HPI:   Patient sent the following message two days ago:  "Dr. Deborra Medina,  I have been taking the Cymbalta like you had prescribed but I still find that I have lots of pain in my hands since I have started back to work after the summer. Is there anyway that I might be able to go to a stronger dosage? I have three pills left before I get a refill so this would be a good time if I can. Thanks for considering it.  [name]"  She has been taking Cymbalta 30 mg daily.  Also taking gabapentin 300 mg twice daily.  She does feel the cymbalta helps but wears off by the end of the day.  She saw Dr. Estanislado Pandy last week on 07/02/19, note reviewed- she feels she has OA, DDD, myofascial pain syndrome. Her assessment and plan was as follows:   Assessment / Plan:     Visit Diagnoses: Primary osteoarthritis of both hands-she has some osteoarthritic changes in her hands but no swelling was noted.  She gives history of intermittent swelling in her hands.  Primary osteoarthritis of both knees - Moderate OA and moderate chondromalacia patella.  She has  been having increased discomfort in her knee joints.  No swelling or effusion was noted.  She has some warmth in her left knee.  She requests a prescription for Pennsaid.  Prescription was called in.  Side effects were reviewed.  Pes planus of both feet-she has bilateral severe pes planus.  The need for wearing proper orthotics was discussed.  Pain in left foot-she has a callus on the medial aspect of her left first MTP and protuberance of her left fifth MTP.  It is related to pes planus.  Myofascial pain-she has been experiencing some generalized pain.  She states that Cymbalta was recently added and she will be discussing with her PCP about increasing Cymbalta.  DDD (degenerative disc disease), lumbar-she had lumbar spine fusion in July 2020 by Dr. Arnoldo Morale and is doing much better now.  Positive ANA (antinuclear antibody) - ANA 1: 320 speckled, ENA negative.  Patient has no clinical features of autoimmune disease.   Family history of rheumatoid arthritis  Family history of celiac disease-she has been recently diagnosed with non-celiac gluten sensitivity.  She has been on gluten-free diet.   Orders: No orders of the defined types were placed in this encounter.      Meds ordered this encounter  Medications  . Diclofenac Sodium (PENNSAID) 2 % SOLN    Sig: Place 2 application onto the skin 2 (two) times daily.    Dispense:  112 g    Refill:  2    Depression is also much improved.  Depression screen [facility] 2/9 07/09/2019 04/09/2019 01/08/2019  Decreased Interest 0 1 3  Down, Depressed, Hopeless 0 1 3  PHQ - 2 Score 0 2 6  Altered sleeping - 2 2  Tired, decreased energy - 1 3  Change in appetite - 2 3  Feeling bad or failure about yourself  - 1 3  Trouble concentrating - 0 2  Moving slowly or fidgety/restless - 0 1  Suicidal thoughts - 0 0  PHQ-9 Score - 8 20  Difficult doing work/chores - Not difficult at all Very difficult      Review of Systems   Musculoskeletal: Positive for joint pain and myalgias.  Psychiatric/Behavioral: Negative for depression, memory loss, substance abuse and suicidal ideas.  All other systems reviewed and are negative.    Patient Active Problem List   Diagnosis Date Noted  . Degenerative scoliosis in adult patient 05/11/2019  . Anxiety and depression 04/08/2019  . Fibromyalgia 04/08/2019  . Primary osteoarthritis of both knees 09/17/2018  . Primary osteoarthritis of both hands 09/17/2018  . DDD (degenerative disc disease), lumbar 08/27/2018  . Family history of rheumatoid arthritis 08/27/2018  . Flat foot 08/27/2018  . Multiple food allergies 07/28/2018  . Family history of celiac disease 07/28/2018  . Iron deficiency anemia 02/07/2018  . History of removal of laparoscopic gastric banding device 02/03/2018  . Anemia 01/13/2018  . H/O laparoscopic adjustable gastric banding 07/29/2013  . Bilateral ovarian cysts 11/13/2012  . Major depression, recurrent, chronic (HCC) 01/22/2011  . Decreased sex drive 11/06/3233  . Morbid obesity (HCC) 02/13/2010  . OTHER MALAISE AND FATIGUE 12/20/2008    Social History   Tobacco Use  . Smoking status: Never Smoker  . Smokeless tobacco: Never Used  Substance Use Topics  . Alcohol use: No    Current Outpatient Medications:  .  albuterol (PROAIR HFA) 108 (90 BASE) MCG/ACT inhaler, Inhale 2 puffs into the lungs every 6 (six) hours as needed for wheezing., Disp: , Rfl:  .  buPROPion (WELLBUTRIN XL) 150 MG 24 hr tablet, Take 2 tablets (300 mg total) by mouth daily., Disp: 60 tablet, Rfl: 3 .  busPIRone (BUSPAR) 10 MG tablet, Take 10 mg by mouth 2 (two) times daily. , Disp: , Rfl:  .  butalbital-acetaminophen-caffeine (FIORICET, ESGIC) 50-325-40 MG tablet, Take 1 tablet by mouth every 6 (six) hours as needed for headache. , Disp: , Rfl:  .  cyclobenzaprine (FLEXERIL) 10 MG tablet, Take 1 tablet (10 mg total) by mouth 3 (three) times daily as needed for muscle spasms.,  Disp: 50 tablet, Rfl: 0 .  DULoxetine (CYMBALTA) 60 MG capsule, Take 1 capsule (60 mg total) by mouth daily., Disp: 90 capsule, Rfl: 3 .  EPINEPHrine (EPIPEN) 0.3 mg/0.3 mL DEVI, Inject 0.3 mLs (0.3 mg total) into the muscle once. (Patient taking differently: Inject 0.3 mg into the muscle as needed (anaphylaxis). ), Disp: 1 Device, Rfl: 0 .  gabapentin (NEURONTIN) 300 MG capsule, Take 1 capsule (300 mg total) by mouth 2 (two) times daily., Disp: 60 capsule, Rfl: 3 .  Diclofenac Sodium (PENNSAID) 2 % SOLN, Place 2 application onto the skin 2 (two) times daily. (Patient not taking: Reported on 07/09/2019), Disp: 112 g, Rfl: 2  Allergies  Allergen Reactions  . Cashew Nut Oil Anaphylaxis  . Cauliflower [Brassica Oleracea Italica] Anaphylaxis  . Whey Anaphylaxis  . Augmentin [Amoxicillin-Pot Clavulanate] Diarrhea and Nausea And Vomiting    Has patient had a PCN reaction causing immediate rash, facial/tongue/throat swelling, SOB or  lightheadedness with hypotension: No Has patient had a PCN reaction causing severe rash involving mucus membranes or skin necrosis: No Has patient had a PCN reaction that required hospitalization: No Has patient had a PCN reaction occurring within the last 10 years: Unknown If all of the above answers are "NO", then may proceed with Cephalosporin use.   Erskine Emery. Orange Fruit [Citrus] Other (See Comments)    Migraines   . Gluten Meal Other (See Comments)    Severe GI upset  . Milk-Related Compounds   . Other     epidural steroid injection - severe headaches, high fever  . Codeine Rash  . Tree Extract Itching    Tee tree oil    Objective:  Wt 274 lb 6.4 oz (124.5 kg)   LMP 11/07/2012   BMI 45.66 kg/m   VITALS: Per patient if applicable, see vitals. GENERAL: Alert, appears well and in no acute distress. HEENT: Atraumatic, conjunctiva clear, no obvious abnormalities on inspection of external nose and ears. NECK: Normal movements of the head and neck. CARDIOPULMONARY:  No increased WOB. Speaking in clear sentences. I:E ratio WNL.  MS: Moves all visible extremities without noticeable abnormality. PSYCH: Pleasant and cooperative, well-groomed. Speech normal rate and rhythm. Affect is appropriate. Insight and judgement are appropriate. Attention is focused, linear, and appropriate.  NEURO: CN grossly intact. Oriented as arrived to appointment on time with no prompting. Moves both UE equally.  SKIN: No obvious lesions, wounds, erythema, or cyanosis noted on face or hands.  [facility] 2/[facility] 07/09/2019 04/09/2019 01/08/2019  Decreased Interest 0 1 3  Down, Depressed, Hopeless 0 1 3  PHQ - 2 Score 0 2 6  Altered sleeping - 2 2  Tired, decreased energy - 1 3  Change in appetite - 2 3  Feeling bad or failure about yourself  - 1 3  Trouble concentrating - 0 2  Moving slowly or fidgety/restless - 0 1  Suicidal thoughts - 0 0  PHQ-9 Score - 8 20  Difficult doing work/chores - Not difficult at all Very difficult    Assessment and Plan:   [name] was seen today for follow-up.  Diagnoses and all orders for this visit:  Fibromyalgia  Anxiety and depression  Major depression, recurrent, chronic (HCC)  Primary osteoarthritis of both hands  Primary osteoarthritis of both knees  Other orders -     DULoxetine (CYMBALTA) 60 MG capsule; Take 1 capsule (60 mg total) by mouth daily.    Marland Kitchen. COVID-19 Education: The signs and symptoms of COVID-19 were discussed with the patient and how to seek care for testing if needed. The importance of social distancing was discussed today. . Reviewed expectations re: course of current medical issues. . Discussed self-management of symptoms. . Outlined signs and symptoms indicating need for more acute intervention. . Patient verbalized understanding and all questions were answered. Marland Kitchen. Health Maintenance issues including appropriate healthy diet, exercise, and smoking avoidance were discussed with patient. . See orders for  this visit as documented in the electronic medical record.  Ruthe Mannanalia Christell Steinmiller, MD  Records requested if needed. Time spent: 25 minutes, of which >50% was spent in obtaining information about her symptoms, reviewing her previous labs, evaluations, and treatments, counseling her about her condition (please see the discussed topics above), and developing a plan to further investigate it; she had a number of questions which I addressed.

## 2019-07-09 ENCOUNTER — Encounter: Payer: Self-pay | Admitting: Family Medicine

## 2019-07-09 ENCOUNTER — Ambulatory Visit (INDEPENDENT_AMBULATORY_CARE_PROVIDER_SITE_OTHER): Payer: BC Managed Care – PPO | Admitting: Family Medicine

## 2019-07-09 VITALS — Wt 274.4 lb

## 2019-07-09 DIAGNOSIS — F419 Anxiety disorder, unspecified: Secondary | ICD-10-CM | POA: Diagnosis not present

## 2019-07-09 DIAGNOSIS — F339 Major depressive disorder, recurrent, unspecified: Secondary | ICD-10-CM | POA: Diagnosis not present

## 2019-07-09 DIAGNOSIS — F329 Major depressive disorder, single episode, unspecified: Secondary | ICD-10-CM

## 2019-07-09 DIAGNOSIS — M797 Fibromyalgia: Secondary | ICD-10-CM | POA: Diagnosis not present

## 2019-07-09 DIAGNOSIS — M19041 Primary osteoarthritis, right hand: Secondary | ICD-10-CM

## 2019-07-09 DIAGNOSIS — M170 Bilateral primary osteoarthritis of knee: Secondary | ICD-10-CM

## 2019-07-09 DIAGNOSIS — R0683 Snoring: Secondary | ICD-10-CM

## 2019-07-09 DIAGNOSIS — M19042 Primary osteoarthritis, left hand: Secondary | ICD-10-CM

## 2019-07-09 DIAGNOSIS — G4733 Obstructive sleep apnea (adult) (pediatric): Secondary | ICD-10-CM | POA: Insufficient documentation

## 2019-07-09 DIAGNOSIS — Z9989 Dependence on other enabling machines and devices: Secondary | ICD-10-CM | POA: Insufficient documentation

## 2019-07-09 DIAGNOSIS — F32A Depression, unspecified: Secondary | ICD-10-CM

## 2019-07-09 MED ORDER — PENNSAID 2 % TD SOLN: 2.0000 "application " | Freq: Two times a day (BID) | TRANSDERMAL | 2 refills | Status: DC

## 2019-07-09 MED ORDER — DULOXETINE HCL 60 MG PO CPEP: 60.0000 mg | ORAL_CAPSULE | Freq: Every day | ORAL | 3 refills | Status: DC

## 2019-07-09 NOTE — Assessment & Plan Note (Signed)
>  40 minutes spent in face to face time with patient, >50% spent in counselling or coordination of care discussing hand pain, fibromyaglia, snoring  and depression.  Depression is much better with cymbalta and wellbutrin and buspar.  I am increasing her Cymbalta to 60 mg for her fibromyalgia pain/ hand pain. Also sent pennsaid to mail order because it's cheaper. She will update me me in a few weeks with an update.

## 2019-07-10 ENCOUNTER — Ambulatory Visit (INDEPENDENT_AMBULATORY_CARE_PROVIDER_SITE_OTHER): Payer: BC Managed Care – PPO | Admitting: Internal Medicine

## 2019-07-10 ENCOUNTER — Other Ambulatory Visit: Payer: Self-pay

## 2019-07-10 ENCOUNTER — Encounter: Payer: Self-pay | Admitting: Internal Medicine

## 2019-07-10 VITALS — BP 128/74 | HR 97 | Temp 97.9°F | Ht 65.0 in | Wt 276.0 lb

## 2019-07-10 DIAGNOSIS — G4719 Other hypersomnia: Secondary | ICD-10-CM

## 2019-07-10 NOTE — Patient Instructions (Addendum)
Will send for sleep study.  Continue using proair as needed.   Sleep Apnea    Sleep apnea is disorder that affects a person's sleep. A person with sleep apnea has abnormal pauses in their breathing when they sleep. It is hard for them to get a good sleep. This makes a person tired during the day. It also can lead to other physical problems. There are three types of sleep apnea. One type is when breathing stops for a short time because your airway is blocked (obstructive sleep apnea). Another type is when the brain sometimes fails to give the normal signal to breathe to the muscles that control your breathing (central sleep apnea). The third type is a combination of the other two types.  HOME CARE   Take all medicine as told by your doctor.  Avoid alcohol, calming medicines (sedatives), and depressant drugs.  Try to lose weight if you are overweight. Talk to your doctor about a healthy weight goal.  Your doctor may have you use a device that helps to open your airway. It can help you get the air that you need. It is called a positive airway pressure (PAP) device.   MAKE SURE YOU:   Understand these instructions.  Will watch your condition.  Will get help right away if you are not doing well or get worse.  It may take approximately 1 month for you to get used to wearing her CPAP every night.  Be sure to work with your machine to get used to it, be patient, it may take time!  If you have trouble tolerating CPAP DO NOT RETURN YOUR MACHINE; Contact our office to see if we can help you tolerate the CPAP better first!

## 2019-07-10 NOTE — Progress Notes (Signed)
[facility] Consultation      Assessment and Plan:  Excessive daytime sleepiness. - Symptoms and signs of obstructive sleep apnea. - We will send for sleep study, start on CPAP as indicated. - Patient has very enlarged tonsils, tonsillectomy may be indicated if CPAP not tolerated.  Allergic asthma. - Patient symptoms are generally well controlled with use of albuterol once to twice per week. - Discussed identification of potential triggers and avoidance.  Also discussed the use of albuterol inhaler.  Insomnia. - Patient has trouble with initiation of sleep.  We discussed various strategies including relaxation techniques, use of white noise generators or relazation apps on her smartphone, and not laying in bed if unable to sleep.  Orders Placed This Encounter  Procedures  . Home sleep test   Return in about 3 months (around 10/09/2019).   Date: 07/10/2019  MRN# [id number] [name] November 22, 1975    [name] is a 43 y.o. old female seen in consultation for chief complaint of:    Chief Complaint  Patient presents with  . sleep consult    per Dr. Deborra Medina- no prior sleep study- c/o loud snoring, daytime sleepiness and restless sleep.     HPI:  [name] is a 43 y.o. old female, she wakes feeling tired in the morning and has problems staying asleep. Her father and sister both has OSA. She snores loudly and her husband wears ear plugs and takes a benadryl.  She goes to bed around 10 30 am, she lays in bed and has trouble falling asleep. She usually falls asleep around MN. She wakes several times, then gets out of bed at 630, feeling tired.  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  Denies sleep paralysis, cataplexy, sleep walking.  Denies TMJ or jaw pain, no dentures.   She has a history allergic asthma, she has proventil which she uses a few times per week, made worse certain times of year, exertion, strong scents or  smells. Also is worse when she has a chest cold.  She has no pets.  She has no sinus drainage, she has mild reflux, controlled by diet.    PMHX:   Past Medical History:  Diagnosis Date  . Anemia   . Arthritis   . Asthma    EXERCISE INDUCED  . Bursitis of hip   . Depression   . Family history of adverse reaction to anesthesia    patient states father had a reaction to anesthesia about 20 years ago where his face and throat swelled, had to be reintubated."  . Generalized headaches   . Irregular menses   . Obesity (BMI 30-39.9)    Surgical Hx:  Past Surgical History:  Procedure Laterality Date  . ABDOMINAL HYSTERECTOMY    . ACHILLES TENDON LENGTHENING  1989  . ANKLE GANGLION CYST EXCISION  02/2012   left ankle  . ANTERIOR LAT LUMBAR FUSION Right 05/11/2019   Procedure: ANTERIOR LATERAL LUMBAR INTERBODY FUSION, LATERAL INSTRUMENTATION, RIGHT LUMBAR TWO- LUMBAR THREE;  Surgeon: Newman Pies, MD;  Location: [facility];  Service: Neurosurgery;  Laterality: Right;  ANTERIOR LATERAL LUMBAR INTERBODY FUSION, LATERAL INSTRUMENTATION, RIGHT LUMBAR TWO- LUMBAR THREE  . BIOPSY  03/16/2019   Procedure: BIOPSY;  Surgeon: Daneil Dolin, MD;  [facility];  Service: Endoscopy;;  Gastric   . COLONOSCOPY WITH PROPOFOL N/A 03/16/2019   Dr. Gala Romney: Normal terminal ileum, 15 cm.  Normal colon.  Next colonoscopy 10 years  . DILATION AND CURETTAGE OF UTERUS  2003  . ENTEROSCOPY N/A 04/02/2019   Dr. Jena Gauss: bulbar erosions, jejunal ulcerations, attempted biopsy but scope fell backwards and lesions cannot be reidentified.  No evidence of ampullary lesion or mass.  . ESOPHAGOGASTRODUODENOSCOPY (EGD) WITH PROPOFOL N/A 03/16/2019   Dr. Jena Gauss: Large hiatal hernia, Cameron/antral erosions, duodenal erosions.  Gastric biopsy showed reactive gastropathy, mild chronic gastritis, negative for H. pylori  . FEET SURGERY  1990   INSERTION OF BONE GRAFT IN FEET  . GIVENS CAPSULE STUDY N/A 03/16/2019   Procedure:  GIVENS CAPSULE STUDY;  Surgeon: Corbin Ade, MD;  [facility];  Service: Endoscopy;  Laterality: N/A;  . LAPAROSCOPIC GASTRIC BANDING  10/17/10   Dr Gaynelle Adu; [facility]  . LAPAROSCOPIC REPAIR AND REMOVAL OF GASTRIC BAND  01/2018  . LIPOMA EXCISION  02/2006   abdomen   Family Hx:  Family History  Problem Relation Age of Onset  . Heart disease Maternal Grandmother        V. Fib arrest  . Hypertension Mother   . Hyperlipidemia Mother   . Diabetes Mother        pre-diabetes  . Hypertension Father   . Hyperlipidemia Father   . Heart disease Father   . Diabetes Father   . Cancer Father        skin  . Diabetes Sister   . Hypertension Sister   . Cancer Maternal Grandfather        lung and skin  . Colon cancer Maternal Grandfather   . Cancer Paternal Grandfather        small bowel cancer and skin  . Pleurisy Son   . Migraines Son   . Colon polyps Other        aunt and uncle   Social Hx:   Social History   Tobacco Use  . Smoking status: Never Smoker  . Smokeless tobacco: Never Used  Substance Use Topics  . Alcohol use: No  . Drug use: No   Medication:    Current Outpatient Medications:  .  albuterol (PROAIR HFA) 108 (90 BASE) MCG/ACT inhaler, Inhale 2 puffs into the lungs every 6 (six) hours as needed for wheezing., Disp: , Rfl:  .  buPROPion (WELLBUTRIN XL) 150 MG 24 hr tablet, Take 2 tablets (300 mg total) by mouth daily., Disp: 60 tablet, Rfl: 3 .  busPIRone (BUSPAR) 10 MG tablet, Take 10 mg by mouth 2 (two) times daily. , Disp: , Rfl:  .  butalbital-acetaminophen-caffeine (FIORICET, ESGIC) 50-325-40 MG tablet, Take 1 tablet by mouth every 6 (six) hours as needed for headache. , Disp: , Rfl:  .  cyclobenzaprine (FLEXERIL) 10 MG tablet, Take 1 tablet (10 mg total) by mouth 3 (three) times daily as needed for muscle spasms., Disp: 50 tablet, Rfl: 0 .  Diclofenac Sodium (PENNSAID) 2 % SOLN, Place 2 application onto the skin 2 (two) times daily., Disp: 112  g, Rfl: 2 .  DULoxetine (CYMBALTA) 60 MG capsule, Take 1 capsule (60 mg total) by mouth daily., Disp: 90 capsule, Rfl: 3 .  EPINEPHrine (EPIPEN) 0.3 mg/0.3 mL DEVI, Inject 0.3 mLs (0.3 mg total) into the muscle once. (Patient taking differently: Inject 0.3 mg into the muscle as needed (anaphylaxis). ), Disp: 1 Device, Rfl: 0 .  gabapentin (NEURONTIN) 300 MG capsule, Take 1 capsule (300 mg total) by mouth 2 (two) times daily., Disp: 60 capsule, Rfl: 3   Allergies:  Cashew  nut oil, Cauliflower [brassica oleracea italica], Whey, Augmentin [amoxicillin-pot clavulanate], Orange fruit [citrus], Gluten meal, Milk-related compounds, Other, Codeine, and Tree extract  Review of Systems: Gen:  Denies  fever, sweats, chills HEENT: Denies blurred vision, double vision. bleeds, sore throat Cvc:  No dizziness, chest pain. Resp:   Denies cough or sputum production, shortness of breath Gi: Denies swallowing difficulty, stomach pain. Gu:  Denies bladder incontinence, burning urine Ext:   No Joint pain, stiffness. Skin: No skin rash,  hives  Endoc:  No polyuria, polydipsia. Psych: No depression, insomnia. Other:  All other systems were reviewed with the patient and were negative other that what is mentioned in the HPI.   Physical Examination:   VS: BP 128/74 (BP Location: Left Arm, Cuff Size: Normal)   Pulse 97   Temp 97.9 F (36.6 C) (Temporal)   Ht 5\' 5"  (1.651 m)   Wt 276 lb (125.2 kg)   LMP 11/07/2012   SpO2 95%   BMI 45.93 kg/m   General Appearance: No distress  Neuro:without focal findings,  speech normal,  HEENT: PERRLA, EOM intact.  Mallampati 4, enlarged tonsils. Pulmonary: normal breath sounds, No wheezing.  CardiovascularNormal S1,S2.  No m/r/g.   Abdomen: Benign, Soft, non-tender. Renal:  No costovertebral tenderness  GU:  No performed at this time. Endoc: No evident thyromegaly, no signs of acromegaly. Skin:   warm, no rashes, no ecchymosis  Extremities: normal, no cyanosis,  clubbing.  Other findings:    LABORATORY PANEL:   CBC No results for input(s): WBC, HGB, HCT, PLT in the last 168 hours. ------------------------------------------------------------------------------------------------------------------  Chemistries  No results for input(s): NA, K, CL, CO2, GLUCOSE, BUN, CREATININE, CALCIUM, MG, AST, ALT, ALKPHOS, BILITOT in the last 168 hours.  Invalid input(s): GFRCGP ------------------------------------------------------------------------------------------------------------------  Cardiac Enzymes No results for input(s): TROPONINI in the last 168 hours. ------------------------------------------------------------  RADIOLOGY:  No results found.     Thank  you for the consultation and for allowing [facility] to assist in the care of your patient. Our recommendations are noted above.  Please contact us if we can be of further service.   Wells Guileseep Durelle Zepeda, M.D., F.C.C.P.  Board Certified in Internal Medicine, Pulmonary Medicine, Critical Care Medicine, and Sleep Medicine.  [facility] Office Number: [phone]   07/10/2019

## 2019-08-12 ENCOUNTER — Encounter: Payer: Self-pay | Admitting: Family Medicine

## 2019-08-13 MED ORDER — GABAPENTIN 300 MG PO CAPS: 300.0000 mg | ORAL_CAPSULE | Freq: Two times a day (BID) | ORAL | 3 refills | Status: DC

## 2019-08-25 ENCOUNTER — Encounter: Payer: Self-pay | Admitting: Physician Assistant

## 2019-08-25 ENCOUNTER — Ambulatory Visit: Payer: Self-pay

## 2019-08-25 ENCOUNTER — Encounter: Payer: Self-pay | Admitting: Rheumatology

## 2019-08-25 ENCOUNTER — Ambulatory Visit (INDEPENDENT_AMBULATORY_CARE_PROVIDER_SITE_OTHER): Payer: BC Managed Care – PPO | Admitting: Physician Assistant

## 2019-08-25 ENCOUNTER — Other Ambulatory Visit: Payer: Self-pay

## 2019-08-25 VITALS — BP 136/89 | HR 77 | Resp 15 | Ht 65.0 in | Wt 280.6 lb

## 2019-08-25 DIAGNOSIS — M7918 Myalgia, other site: Secondary | ICD-10-CM | POA: Diagnosis not present

## 2019-08-25 DIAGNOSIS — M19041 Primary osteoarthritis, right hand: Secondary | ICD-10-CM | POA: Diagnosis not present

## 2019-08-25 DIAGNOSIS — G8929 Other chronic pain: Secondary | ICD-10-CM

## 2019-08-25 DIAGNOSIS — M25562 Pain in left knee: Secondary | ICD-10-CM | POA: Diagnosis not present

## 2019-08-25 DIAGNOSIS — M2141 Flat foot [pes planus] (acquired), right foot: Secondary | ICD-10-CM | POA: Diagnosis not present

## 2019-08-25 DIAGNOSIS — F419 Anxiety disorder, unspecified: Secondary | ICD-10-CM

## 2019-08-25 DIAGNOSIS — M170 Bilateral primary osteoarthritis of knee: Secondary | ICD-10-CM | POA: Diagnosis not present

## 2019-08-25 DIAGNOSIS — Z8709 Personal history of other diseases of the respiratory system: Secondary | ICD-10-CM

## 2019-08-25 DIAGNOSIS — Z8379 Family history of other diseases of the digestive system: Secondary | ICD-10-CM

## 2019-08-25 DIAGNOSIS — N83201 Unspecified ovarian cyst, right side: Secondary | ICD-10-CM

## 2019-08-25 DIAGNOSIS — F329 Major depressive disorder, single episode, unspecified: Secondary | ICD-10-CM

## 2019-08-25 DIAGNOSIS — R768 Other specified abnormal immunological findings in serum: Secondary | ICD-10-CM

## 2019-08-25 DIAGNOSIS — M25561 Pain in right knee: Secondary | ICD-10-CM

## 2019-08-25 DIAGNOSIS — M5136 Other intervertebral disc degeneration, lumbar region: Secondary | ICD-10-CM

## 2019-08-25 DIAGNOSIS — M19042 Primary osteoarthritis, left hand: Secondary | ICD-10-CM

## 2019-08-25 DIAGNOSIS — F32A Depression, unspecified: Secondary | ICD-10-CM

## 2019-08-25 DIAGNOSIS — M2142 Flat foot [pes planus] (acquired), left foot: Secondary | ICD-10-CM

## 2019-08-25 DIAGNOSIS — Z862 Personal history of diseases of the blood and blood-forming organs and certain disorders involving the immune mechanism: Secondary | ICD-10-CM

## 2019-08-25 DIAGNOSIS — N83202 Unspecified ovarian cyst, left side: Secondary | ICD-10-CM

## 2019-08-25 DIAGNOSIS — Z8261 Family history of arthritis: Secondary | ICD-10-CM

## 2019-08-25 MED ORDER — TRIAMCINOLONE ACETONIDE 40 MG/ML IJ SUSP
40.0000 mg | INTRAMUSCULAR | Status: AC | PRN
  Administered 2019-08-25: 40 mg via INTRA_ARTICULAR

## 2019-08-25 MED ORDER — LIDOCAINE HCL 1 % IJ SOLN
1.5000 mL | INTRAMUSCULAR | Status: AC | PRN
  Administered 2019-08-25: 1.5 mL

## 2019-08-25 NOTE — Progress Notes (Signed)
Office Visit Note  Patient: [name]             Date of Birth: Jun 25, 1976           MRN: [id number]             PCP: Lucille Passy, MD Referring: Lucille Passy, MD Visit Date: 08/25/2019 Occupation: @GUAROCC @  Subjective:  Left knee joint pain   History of Present Illness: [name] is a 43 y.o. female with history of osteoarthritis and positive ANA. She presents today with pain in both knee joints, worse in the left knee joint.  She states she ran out of the prescription for gabapentin about 2-3 weeks ago, and she has she started having increased pain in both knee joints while off of gabapentin.  She has also been climbing steps and standing more recently.  She has noticed swelling and stiffness in the left knee joint.  She has been having difficulty bending the left knee joint due to the joint swelling. She has been using pennsaid topically as needed for pain relief.   Activities of Daily Living:  Patient reports morning stiffness for 15-20 minutes.   Patient Reports nocturnal pain.  Difficulty dressing/grooming: Denies Difficulty climbing stairs: Reports Difficulty getting out of chair: Denies Difficulty using hands for taps, buttons, cutlery, and/or writing: Reports  Review of Systems  Constitutional: Positive for fatigue.  HENT: Negative for mouth sores, mouth dryness and nose dryness.   Eyes: Negative for pain, itching, visual disturbance and dryness.  Respiratory: Negative for cough, hemoptysis, shortness of breath, wheezing and difficulty breathing.   Cardiovascular: Negative for chest pain, palpitations, hypertension and swelling in legs/feet.  Gastrointestinal: Negative for blood in stool, constipation and diarrhea.  Endocrine: Negative for increased urination.  Genitourinary: Negative for difficulty urinating and painful urination.  Musculoskeletal: Positive for arthralgias, joint pain, joint swelling and morning stiffness. Negative for myalgias, muscle  weakness, muscle tenderness and myalgias.  Skin: Negative for color change, pallor, rash, hair loss, nodules/bumps, skin tightness, ulcers and sensitivity to sunlight.  Allergic/Immunologic: Negative for susceptible to infections.  Neurological: Negative for dizziness, light-headedness, headaches and memory loss.  Hematological: Negative for bruising/bleeding tendency and swollen glands.  Psychiatric/Behavioral: Negative for depressed mood, confusion and sleep disturbance. The patient is not nervous/anxious.     PMFS History:  Patient Active Problem List   Diagnosis Date Noted   Snoring 07/09/2019   Degenerative scoliosis in adult patient 05/11/2019   Anxiety and depression 04/08/2019   Fibromyalgia 04/08/2019   Primary osteoarthritis of both knees 09/17/2018   Primary osteoarthritis of both hands 09/17/2018   DDD (degenerative disc disease), lumbar 08/27/2018   Family history of rheumatoid arthritis 08/27/2018   Flat foot 08/27/2018   Multiple food allergies 07/28/2018   Family history of celiac disease 07/28/2018   Iron deficiency anemia 02/07/2018   History of removal of laparoscopic gastric banding device 02/03/2018   Anemia 01/13/2018   H/O laparoscopic adjustable gastric banding 07/29/2013   Bilateral ovarian cysts 11/13/2012   Major depression, recurrent, chronic ([place]) 01/22/2011   Decreased sex drive 75/17/0017   Morbid obesity ([place]) 02/13/2010   OTHER MALAISE AND FATIGUE 12/20/2008    Past Medical History:  Diagnosis Date   Anemia    Arthritis    Asthma    EXERCISE INDUCED   Bursitis of hip    Depression    Family history of adverse reaction to anesthesia    patient states father had a reaction to  anesthesia about 20 years ago where his face and throat swelled, had to be reintubated."   Generalized headaches    Irregular menses    Obesity (BMI 30-39.9)     Family History  Problem Relation Age of Onset   Heart disease Maternal  Grandmother        V. Fib arrest   Hypertension Mother    Hyperlipidemia Mother    Diabetes Mother        pre-diabetes   Hypertension Father    Hyperlipidemia Father    Heart disease Father    Diabetes Father    Cancer Father        skin   Diabetes Sister    Hypertension Sister    Cancer Maternal Grandfather        lung and skin   Colon cancer Maternal Grandfather    Cancer Paternal Grandfather        small bowel cancer and skin   Pleurisy Son    Migraines Son    Colon polyps Other        aunt and uncle   Past Surgical History:  Procedure Laterality Date   ABDOMINAL HYSTERECTOMY     ACHILLES TENDON LENGTHENING  1989   ANKLE GANGLION CYST EXCISION  02/2012   left ankle   ANTERIOR LAT LUMBAR FUSION Right 05/11/2019   Procedure: ANTERIOR LATERAL LUMBAR INTERBODY FUSION, LATERAL INSTRUMENTATION, RIGHT LUMBAR TWO- LUMBAR THREE;  Surgeon: Tressie StalkerJenkins, Jeffrey, MD;  [facility];  Service: Neurosurgery;  Laterality: Right;  ANTERIOR LATERAL LUMBAR INTERBODY FUSION, LATERAL INSTRUMENTATION, RIGHT LUMBAR TWO- LUMBAR THREE   BIOPSY  03/16/2019   Procedure: BIOPSY;  Surgeon: Corbin Adeourk, Robert M, MD;  [facility];  Service: Endoscopy;;  Gastric    COLONOSCOPY WITH PROPOFOL N/A 03/16/2019   Dr. Jena Gaussourk: Normal terminal ileum, 15 cm.  Normal colon.  Next colonoscopy 10 years   DILATION AND CURETTAGE OF UTERUS  2003   ENTEROSCOPY N/A 04/02/2019   Dr. Jena Gaussourk: bulbar erosions, jejunal ulcerations, attempted biopsy but scope fell backwards and lesions cannot be reidentified.  No evidence of ampullary lesion or mass.   ESOPHAGOGASTRODUODENOSCOPY (EGD) WITH PROPOFOL N/A 03/16/2019   Dr. Jena Gaussourk: Large hiatal hernia, Cameron/antral erosions, duodenal erosions.  Gastric biopsy showed reactive gastropathy, mild chronic gastritis, negative for H. pylori   FEET SURGERY  1990   INSERTION OF BONE GRAFT IN FEET   GIVENS CAPSULE STUDY N/A 03/16/2019   Procedure: GIVENS CAPSULE  STUDY;  Surgeon: Corbin Adeourk, Robert M, MD;  [facility];  Service: Endoscopy;  Laterality: N/A;   LAPAROSCOPIC GASTRIC BANDING  10/17/10   Dr Gaynelle AduEric Wilson; [facility]   LAPAROSCOPIC REPAIR AND REMOVAL OF GASTRIC BAND  01/2018   LIPOMA EXCISION  02/2006   abdomen   Social History   Social History Narrative   Not on file   Immunization History  Administered Date(s) Administered   Influenza Inj Mdck Quad Pf 09/23/2018   Influenza,inj,quad, With Preservative 08/29/2017   Influenza-Unspecified 09/12/2018   Tdap 01/13/2018     Objective: Vital Signs: BP 136/89 (BP Location: Left Wrist, Patient Position: Sitting, Cuff Size: Normal)    Pulse 77    Resp 15    Ht 5\' 5"  (1.651 m)    Wt 280 lb 9.6 oz (127.3 kg)    LMP 11/07/2012    BMI 46.69 kg/m    Physical Exam Vitals signs and nursing note reviewed.  Constitutional:      Appearance: She is well-developed.  HENT:     Head: Normocephalic and atraumatic.  Eyes:     Conjunctiva/sclera: Conjunctivae normal.  Neck:     Musculoskeletal: Normal range of motion.  Cardiovascular:     Rate and Rhythm: Normal rate and regular rhythm.     Heart sounds: Normal heart sounds.  Pulmonary:     Effort: Pulmonary effort is normal.     Breath sounds: Normal breath sounds.  Abdominal:     General: Bowel sounds are normal.     Palpations: Abdomen is soft.  Lymphadenopathy:     Cervical: No cervical adenopathy.  Skin:    General: Skin is warm and dry.     Capillary Refill: Capillary refill takes less than 2 seconds.  Neurological:     Mental Status: She is alert and oriented to person, place, and time.  Psychiatric:        Behavior: Behavior normal.      Musculoskeletal Exam: C-spine, thoracic spine, and lumbar spine good ROM.  No midline spinal tenderness.  No SI joint tenderness.  Shoulder joints, elbow joints, wrist joints, MCPs, PIPs, and DIPs good ROM with no synovitis.  Complete fist formation bilaterally.  Hip joints good  ROM with no discomfort.  Knee joints have good ROM with discomfort bilaterally. Left knee joint warmth but no effusion.  Right knee joint no warmth or effusion.  Ankle joints good ROM with no tenderness or inflammation.   CDAI Exam: CDAI Score: -- Patient Global: --; Provider Global: -- Swollen: --; Tender: -- Joint Exam   No joint exam has been documented for this visit   There is currently no information documented on the homunculus. Go to the Rheumatology activity and complete the homunculus joint exam.  Investigation: No additional findings.  Imaging: No results found.  Recent Labs: Lab Results  Component Value Date   WBC 8.6 05/26/2019   HGB 13.9 05/26/2019   PLT 304 05/26/2019   NA 137 05/12/2019   K 4.4 05/12/2019   CL 103 05/12/2019   CO2 26 05/12/2019   GLUCOSE 133 (H) 05/12/2019   BUN 8 05/12/2019   CREATININE 0.91 05/12/2019   BILITOT 0.5 02/24/2019   ALKPHOS 83 02/24/2019   AST 17 02/24/2019   ALT 22 02/24/2019   PROT 7.6 02/24/2019   ALBUMIN 4.0 02/24/2019   CALCIUM 8.7 (L) 05/12/2019   GFRAA >60 05/12/2019    Speciality Comments: No specialty comments available.  Procedures:  Large Joint Inj: L knee on 08/25/2019 11:52 AM Indications: pain Details: 27 G 1.5 in needle, medial approach  Arthrogram: No  Medications: 1.5 mL lidocaine 1 %; 40 mg triamcinolone acetonide 40 MG/ML Aspirate: 0 mL Outcome: tolerated well, no immediate complications Procedure, treatment alternatives, risks and benefits explained, specific risks discussed. Consent was given by the patient. Immediately prior to procedure a time out was called to verify the correct patient, procedure, equipment, support staff and site/side marked as required. Patient was prepped and draped in the usual sterile fashion.     Allergies: Cashew nut oil, Cauliflower [brassica oleracea italica], Whey, Augmentin [amoxicillin-pot clavulanate], Orange fruit [citrus], Gluten meal, Milk-related  compounds, Other, Codeine, and Tree extract   Assessment / Plan:     Visit Diagnoses: Primary osteoarthritis of both hands: She has no discomfort in her hands at this time.  She has no tenderness or synovitis.  She has no difficulty with ADLs.  Joint protection and muscle strengthening were discussed.  Primary osteoarthritis of both knees - X-rays all both knee  joints were obtained today which revealed moderate osteoarthritis and moderate chondromalacia patella.  She has not had any radiographic progression when compared to x-rays on 08/27/18.  She has been experiencing increased pain in bilateral knee joints for the past 2 to 3 weeks.  She states that she ran out of her prescription for gabapentin and noticed increased discomfort in both knees.  She is also been standing and climbing steps more frequently than she has in the past recently.  The pain is most severe in the left knee joint.  She is not experiencing any mechanical symptoms at this time.  She is been applying Pennsaid topically as needed for pain relief.  She has good range of motion of bilateral knee joints on exam today.  She has warmth of the left knee but no effusion noted.  Treatment options were discussed.  She requested a left knee joint cortisone injection.  She tolerated the procedure well.  The procedure note was completed above above.  Aftercare was discussed.  She does not need a refill of Pennsaid at this time.  She was given a handout of knee joint exercises to perform.  She was advised to notify us if her joint pain persists or worsens.  Pes planus of both feet: She has no discomfort in her feet at this time.   Myofascial pain: She has occasional myofascial pain.  She continues to have chronic fatigue.   DDD (degenerative disc disease), lumbar: She has chronic lower back pain.  She has no symptoms of radiculopathy at this time.    Positive ANA (antinuclear antibody) - ANA 1: 320 speckled, ENA negative.  Patient has no  clinical features of autoimmune disease at this time.   Other medical conditions are listed as follows:   Family history of rheumatoid arthritis  Family history of celiac disease  History of anemia  Anxiety and depression  Bilateral ovarian cysts  History of asthma  Chronic pain of both knees - Plan: XR KNEE 3 VIEW LEFT, XR KNEE 3 VIEW RIGHT  Orders: Orders Placed This Encounter  Procedures   Large Joint Inj   XR KNEE 3 VIEW LEFT   XR KNEE 3 VIEW RIGHT   No orders of the defined types were placed in this encounter.   Face-to-face time spent with patient was 30 minutes. Greater than 50% of time was spent in counseling and coordination of care.  Follow-Up Instructions: Return for Osteoarthritis.   Gearldine Bienenstock, PA-C  Note - This record has been created using Dragon software.  Chart creation errors have been sought, but may not always  have been located. Such creation errors do not reflect on  the standard of medical care.

## 2019-08-25 NOTE — Patient Instructions (Signed)
Journal for Nurse Practitioners, 15(4), 263-267. Retrieved August 04, 2018 from [email or web address].com/[email or web address]">  Knee Exercises Ask your health care provider which exercises are safe for you. Do exercises exactly as told by your health care provider and adjust them as directed. It is normal to feel mild stretching, pulling, tightness, or discomfort as you do these exercises. Stop right away if you feel sudden pain or your pain gets worse. Do not begin these exercises until told by your health care provider. Stretching and range-of-motion exercises These exercises warm up your muscles and joints and improve the movement and flexibility of your knee. These exercises also help to relieve pain and swelling. Knee extension, prone 1. Lie on your abdomen (prone position) on a bed. 2. Place your left / right knee just beyond the edge of the surface so your knee is not on the bed. You can put a towel under your left / right thigh just above your kneecap for comfort. 3. Relax your leg muscles and allow gravity to straighten your knee (extension). You should feel a stretch behind your left / right knee. 4. Hold this position for __________ seconds. 5. Scoot up so your knee is supported between repetitions. Repeat __________ times. Complete this exercise __________ times a day. Knee flexion, active  1. Lie on your back with both legs straight. If this causes back discomfort, bend your left / right knee so your foot is flat on the floor. 2. Slowly slide your left / right heel back toward your buttocks. Stop when you feel a gentle stretch in the front of your knee or thigh (flexion). 3. Hold this position for __________ seconds. 4. Slowly slide your left / right heel back to the starting position. Repeat __________ times. Complete this exercise __________ times a day. Quadriceps stretch, prone  1. Lie on your abdomen on a firm surface, such as a bed or padded floor. 2. Bend your left / right knee and hold  your ankle. If you cannot reach your ankle or pant leg, loop a belt around your foot and grab the belt instead. 3. Gently pull your heel toward your buttocks. Your knee should not slide out to the side. You should feel a stretch in the front of your thigh and knee (quadriceps). 4. Hold this position for __________ seconds. Repeat __________ times. Complete this exercise __________ times a day. Hamstring, supine 1. Lie on your back (supine position). 2. Loop a belt or towel over the ball of your left / right foot. The ball of your foot is on the walking surface, right under your toes. 3. Straighten your left / right knee and slowly pull on the belt to raise your leg until you feel a gentle stretch behind your knee (hamstring). ? Do not let your knee bend while you do this. ? Keep your other leg flat on the floor. 4. Hold this position for __________ seconds. Repeat __________ times. Complete this exercise __________ times a day. Strengthening exercises These exercises build strength and endurance in your knee. Endurance is the ability to use your muscles for a long time, even after they get tired. Quadriceps, isometric This exercise stretches the muscles in front of your thigh (quadriceps) without moving your knee joint (isometric). 1. Lie on your back with your left / right leg extended and your other knee bent. Put a rolled towel or small pillow under your knee if told by your health care provider. 2. Slowly tense the muscles in the front of your left /   right thigh. You should see your kneecap slide up toward your hip or see increased dimpling just above the knee. This motion will push the back of the knee toward the floor. 3. For __________ seconds, hold the muscle as tight as you can without increasing your pain. 4. Relax the muscles slowly and completely. Repeat __________ times. Complete this exercise __________ times a day. Straight leg raises This exercise stretches the muscles in front  of your thigh (quadriceps) and the muscles that move your hips (hip flexors). 1. Lie on your back with your left / right leg extended and your other knee bent. 2. Tense the muscles in the front of your left / right thigh. You should see your kneecap slide up or see increased dimpling just above the knee. Your thigh may even shake a bit. 3. Keep these muscles tight as you raise your leg 4-6 inches (10-15 cm) off the floor. Do not let your knee bend. 4. Hold this position for __________ seconds. 5. Keep these muscles tense as you lower your leg. 6. Relax your muscles slowly and completely after each repetition. Repeat __________ times. Complete this exercise __________ times a day. Hamstring, isometric 1. Lie on your back on a firm surface. 2. Bend your left / right knee about __________ degrees. 3. Dig your left / right heel into the surface as if you are trying to pull it toward your buttocks. Tighten the muscles in the back of your thighs (hamstring) to "dig" as hard as you can without increasing any pain. 4. Hold this position for __________ seconds. 5. Release the tension gradually and allow your muscles to relax completely for __________ seconds after each repetition. Repeat __________ times. Complete this exercise __________ times a day. Hamstring curls If told by your health care provider, do this exercise while wearing ankle weights. Begin with __________ lb weights. Then increase the weight by 1 lb (0.5 kg) increments. Do not wear ankle weights that are more than __________ lb. 1. Lie on your abdomen with your legs straight. 2. Bend your left / right knee as far as you can without feeling pain. Keep your hips flat against the floor. 3. Hold this position for __________ seconds. 4. Slowly lower your leg to the starting position. Repeat __________ times. Complete this exercise __________ times a day. Squats This exercise strengthens the muscles in front of your thigh and knee  (quadriceps). 1. Stand in front of a table, with your feet and knees pointing straight ahead. You may rest your hands on the table for balance but not for support. 2. Slowly bend your knees and lower your hips like you are going to sit in a chair. ? Keep your weight over your heels, not over your toes. ? Keep your lower legs upright so they are parallel with the table legs. ? Do not let your hips go lower than your knees. ? Do not bend lower than told by your health care provider. ? If your knee pain increases, do not bend as low. 3. Hold the squat position for __________ seconds. 4. Slowly push with your legs to return to standing. Do not use your hands to pull yourself to standing. Repeat __________ times. Complete this exercise __________ times a day. Wall slides This exercise strengthens the muscles in front of your thigh and knee (quadriceps). 1. Lean your back against a smooth wall or door, and walk your feet out 18-24 inches (46-61 cm) from it. 2. Place your feet hip-width apart. 3.   Slowly slide down the wall or door until your knees bend __________ degrees. Keep your knees over your heels, not over your toes. Keep your knees in line with your hips. 4. Hold this position for __________ seconds. Repeat __________ times. Complete this exercise __________ times a day. Straight leg raises This exercise strengthens the muscles that rotate the leg at the hip and move it away from your body (hip abductors). 1. Lie on your side with your left / right leg in the top position. Lie so your head, shoulder, knee, and hip line up. You may bend your bottom knee to help you keep your balance. 2. Roll your hips slightly forward so your hips are stacked directly over each other and your left / right knee is facing forward. 3. Leading with your heel, lift your top leg 4-6 inches (10-15 cm). You should feel the muscles in your outer hip lifting. ? Do not let your foot drift forward. ? Do not let your knee  roll toward the ceiling. 4. Hold this position for __________ seconds. 5. Slowly return your leg to the starting position. 6. Let your muscles relax completely after each repetition. Repeat __________ times. Complete this exercise __________ times a day. Straight leg raises This exercise stretches the muscles that move your hips away from the front of the pelvis (hip extensors). 1. Lie on your abdomen on a firm surface. You can put a pillow under your hips if that is more comfortable. 2. Tense the muscles in your buttocks and lift your left / right leg about 4-6 inches (10-15 cm). Keep your knee straight as you lift your leg. 3. Hold this position for __________ seconds. 4. Slowly lower your leg to the starting position. 5. Let your leg relax completely after each repetition. Repeat __________ times. Complete this exercise __________ times a day. This information is not intended to replace advice given to you by your health care provider. Make sure you discuss any questions you have with your health care provider. Document Released: 08/29/2005 Document Revised: 08/05/2018 Document Reviewed: 08/05/2018 Elsevier Patient Education  2020 [employer]  

## 2019-09-01 ENCOUNTER — Other Ambulatory Visit: Payer: Self-pay

## 2019-09-01 ENCOUNTER — Inpatient Hospital Stay (HOSPITAL_COMMUNITY): Payer: BC Managed Care – PPO | Attending: Hematology

## 2019-09-01 DIAGNOSIS — Z881 Allergy status to other antibiotic agents status: Secondary | ICD-10-CM | POA: Diagnosis not present

## 2019-09-01 DIAGNOSIS — Z79899 Other long term (current) drug therapy: Secondary | ICD-10-CM | POA: Diagnosis not present

## 2019-09-01 DIAGNOSIS — R5383 Other fatigue: Secondary | ICD-10-CM | POA: Diagnosis not present

## 2019-09-01 DIAGNOSIS — Z800 Family history of malignant neoplasm of digestive organs: Secondary | ICD-10-CM | POA: Insufficient documentation

## 2019-09-01 DIAGNOSIS — E669 Obesity, unspecified: Secondary | ICD-10-CM | POA: Diagnosis not present

## 2019-09-01 DIAGNOSIS — E538 Deficiency of other specified B group vitamins: Secondary | ICD-10-CM | POA: Diagnosis not present

## 2019-09-01 DIAGNOSIS — Z8719 Personal history of other diseases of the digestive system: Secondary | ICD-10-CM | POA: Insufficient documentation

## 2019-09-01 DIAGNOSIS — Z880 Allergy status to penicillin: Secondary | ICD-10-CM | POA: Diagnosis not present

## 2019-09-01 DIAGNOSIS — Z801 Family history of malignant neoplasm of trachea, bronchus and lung: Secondary | ICD-10-CM | POA: Insufficient documentation

## 2019-09-01 DIAGNOSIS — Z83438 Family history of other disorder of lipoprotein metabolism and other lipidemia: Secondary | ICD-10-CM | POA: Insufficient documentation

## 2019-09-01 DIAGNOSIS — Z8371 Family history of colonic polyps: Secondary | ICD-10-CM | POA: Diagnosis not present

## 2019-09-01 DIAGNOSIS — D509 Iron deficiency anemia, unspecified: Secondary | ICD-10-CM

## 2019-09-01 DIAGNOSIS — Z8249 Family history of ischemic heart disease and other diseases of the circulatory system: Secondary | ICD-10-CM | POA: Diagnosis not present

## 2019-09-01 DIAGNOSIS — Z833 Family history of diabetes mellitus: Secondary | ICD-10-CM | POA: Diagnosis not present

## 2019-09-01 DIAGNOSIS — Z808 Family history of malignant neoplasm of other organs or systems: Secondary | ICD-10-CM | POA: Insufficient documentation

## 2019-09-01 DIAGNOSIS — M1990 Unspecified osteoarthritis, unspecified site: Secondary | ICD-10-CM | POA: Diagnosis not present

## 2019-09-01 DIAGNOSIS — D508 Other iron deficiency anemias: Secondary | ICD-10-CM | POA: Insufficient documentation

## 2019-09-01 DIAGNOSIS — Z885 Allergy status to narcotic agent status: Secondary | ICD-10-CM | POA: Insufficient documentation

## 2019-09-01 DIAGNOSIS — F329 Major depressive disorder, single episode, unspecified: Secondary | ICD-10-CM | POA: Insufficient documentation

## 2019-09-01 DIAGNOSIS — Z9884 Bariatric surgery status: Secondary | ICD-10-CM | POA: Diagnosis not present

## 2019-09-01 LAB — COMPREHENSIVE METABOLIC PANEL
ALT: 17 U/L (ref 0–44)
AST: 17 U/L (ref 15–41)
Albumin: 4.1 g/dL (ref 3.5–5.0)
Alkaline Phosphatase: 112 U/L (ref 38–126)
Anion gap: 8 (ref 5–15)
BUN: 12 mg/dL (ref 6–20)
CO2: 29 mmol/L (ref 22–32)
Calcium: 9.2 mg/dL (ref 8.9–10.3)
Chloride: 102 mmol/L (ref 98–111)
Creatinine, Ser: 0.88 mg/dL (ref 0.44–1.00)
GFR calc Af Amer: >60 mL/min (ref >60)
GFR calc non Af Amer: >60 mL/min (ref >60)
Glucose, Bld: 86 mg/dL (ref 70–99)
Potassium: 4.3 mmol/L (ref 3.5–5.1)
Sodium: 139 mmol/L (ref 135–145)
Total Bilirubin: 0.2 mg/dL — ABNORMAL LOW (ref 0.3–1.2)
Total Protein: 7.6 g/dL (ref 6.5–8.1)

## 2019-09-01 LAB — VITAMIN B12: Vitamin B-12: 204 pg/mL (ref 180–914)

## 2019-09-01 LAB — CBC WITH DIFFERENTIAL/PLATELET
Abs Immature Granulocytes: 0.04 10*3/uL (ref 0.00–0.07)
Basophils Absolute: 0.1 10*3/uL (ref 0.0–0.1)
Basophils Relative: 1 %
Eosinophils Absolute: 0.3 10*3/uL (ref 0.0–0.5)
Eosinophils Relative: 4 %
HCT: 41.8 % (ref 36.0–46.0)
Hemoglobin: 13.5 g/dL (ref 12.0–15.0)
Immature Granulocytes: 1 %
Lymphocytes Relative: 23 %
Lymphs Abs: 2 10*3/uL (ref 0.7–4.0)
MCH: 30.8 pg (ref 26.0–34.0)
MCHC: 32.3 g/dL (ref 30.0–36.0)
MCV: 95.2 fL (ref 80.0–100.0)
Monocytes Absolute: 0.7 10*3/uL (ref 0.1–1.0)
Monocytes Relative: 8 %
Neutro Abs: 5.6 10*3/uL (ref 1.7–7.7)
Neutrophils Relative %: 63 %
Platelets: 326 10*3/uL (ref 150–400)
RBC: 4.39 MIL/uL (ref 3.87–5.11)
RDW: 13.1 % (ref 11.5–15.5)
WBC: 8.6 10*3/uL (ref 4.0–10.5)
nRBC: 0 % (ref 0.0–0.2)

## 2019-09-01 LAB — FERRITIN: Ferritin: 71 ng/mL (ref 11–307)

## 2019-09-01 LAB — IRON AND TIBC
Iron: 54 ug/dL (ref 28–170)
Saturation Ratios: 15 % (ref 10.4–31.8)
TIBC: 367 ug/dL (ref 250–450)
UIBC: 313 ug/dL

## 2019-09-07 ENCOUNTER — Other Ambulatory Visit: Payer: Self-pay

## 2019-09-08 ENCOUNTER — Inpatient Hospital Stay (HOSPITAL_BASED_OUTPATIENT_CLINIC_OR_DEPARTMENT_OTHER): Payer: BC Managed Care – PPO | Admitting: Hematology

## 2019-09-08 DIAGNOSIS — D509 Iron deficiency anemia, unspecified: Secondary | ICD-10-CM | POA: Diagnosis not present

## 2019-09-08 DIAGNOSIS — D508 Other iron deficiency anemias: Secondary | ICD-10-CM | POA: Diagnosis not present

## 2019-09-08 NOTE — Progress Notes (Signed)
[facility] [address], [place] [address]   CLINIC:  Medical Oncology/Hematology  PCP:  Dianne Dun, MD [address] [place] [address] [phone]   REASON FOR VISIT:  Follow-up for Anemia   CURRENT THERAPY: Clinical surveillance   INTERVAL HISTORY:  Ms. Hajduk 43 y.o. female today for follow-up.  She reports overall doing well.  Patient was found to have slippage of her gastric band nearly a year ago and had to be removed.  Patient also had extensive work-up with colonoscopy and endoscopy which were negative for any bleed.  She denies any fatigue.  Denies any obvious signs of bleeding.  Denies any change in bowel habits.  Appetite is stable.  No weight loss.  Denies any abdominal pains.  She is here for repeat labs and office visit.   REVIEW OF SYSTEMS:  Review of Systems  Constitutional: Negative.   HENT:  Negative.   Eyes: Negative.   Respiratory: Negative.   Cardiovascular: Negative.   Gastrointestinal: Negative.   Endocrine: Negative.   Genitourinary: Negative.    Musculoskeletal: Positive for arthralgias and back pain.  Skin: Negative.   Neurological: Negative.   Hematological: Negative.   Psychiatric/Behavioral: Negative.      PAST MEDICAL/SURGICAL HISTORY:  Past Medical History:  Diagnosis Date  . Anemia   . Arthritis   . Asthma    EXERCISE INDUCED  . Bursitis of hip   . Depression   . Family history of adverse reaction to anesthesia    patient states father had a reaction to anesthesia about 20 years ago where his face and throat swelled, had to be reintubated."  . Generalized headaches   . Irregular menses   . Obesity (BMI 30-39.9)    Past Surgical History:  Procedure Laterality Date  . ABDOMINAL HYSTERECTOMY    . ACHILLES TENDON LENGTHENING  1989  . ANKLE GANGLION CYST EXCISION  02/2012   left ankle  . ANTERIOR LAT LUMBAR FUSION Right 05/11/2019   Procedure: ANTERIOR LATERAL LUMBAR INTERBODY FUSION,  LATERAL INSTRUMENTATION, RIGHT LUMBAR TWO- LUMBAR THREE;  Surgeon: Tressie Stalker, MD;  Location[facility] OR;  Service: Neurosurgery;  Laterality: Right;  ANTERIOR LATERAL LUMBAR INTERBODY FUSION, LATERAL INSTRUMENTATION, RIGHT LUMBAR TWO- LUMBAR THREE  . BIOPSY  03/16/2019   Procedure: BIOPSY;  Surgeon: Corbin Ade, MD;  Location: [facility] SUITE;  Service: Endoscopy;;  Gastric   . COLONOSCOPY WITH PROPOFOL N/A 03/16/2019   Dr. Jena Gauss: Normal terminal ileum, 15 cm.  Normal colon.  Next colonoscopy 10 years  . DILATION AND CURETTAGE OF UTERUS  2003  . ENTEROSCOPY N/A 04/02/2019   Dr. Jena Gauss: bulbar erosions, jejunal ulcerations, attempted biopsy but scope fell backwards and lesions cannot be reidentified.  No evidence of ampullary lesion or mass.  . ESOPHAGOGASTRODUODENOSCOPY (EGD) WITH PROPOFOL N/A 03/16/2019   Dr. Jena Gauss: Large hiatal hernia, Cameron/antral erosions, duodenal erosions.  Gastric biopsy showed reactive gastropathy, mild chronic gastritis, negative for H. pylori  . FEET SURGERY  1990   INSERTION OF BONE GRAFT IN FEET  . GIVENS CAPSULE STUDY N/A 03/16/2019   Procedure: GIVENS CAPSULE STUDY;  Surgeon: Corbin Ade, MD;  Location: [facility];  Service: Endoscopy;  Laterality: N/A;  . LAPAROSCOPIC GASTRIC BANDING  10/17/10   Dr Gaynelle Adu; [facility]  . LAPAROSCOPIC REPAIR AND REMOVAL OF GASTRIC BAND  01/2018  . LIPOMA EXCISION  02/2006   abdomen     SOCIAL HISTORY:  Social History  Socioeconomic History  . Marital status: Married    Spouse name: Not on file  . Number of children: Not on file  . Years of education: Not on file  . Highest education level: Not on file  Occupational History  . Not on file  Social Needs  . Financial resource strain: Not on file  . Food insecurity    Worry: Not on file    Inability: Not on file  . Transportation needs    Medical: Not on file    Non-medical: Not on file  Tobacco Use  . Smoking status: Never Smoker  . Smokeless tobacco:  Never Used  Substance and Sexual Activity  . Alcohol use: No  . Drug use: No  . Sexual activity: Yes  Lifestyle  . Physical activity    Days per week: Not on file    Minutes per session: Not on file  . Stress: Not on file  Relationships  . Social Musician on phone: Not on file    Gets together: Not on file    Attends religious service: Not on file    Active member of club or organization: Not on file    Attends meetings of clubs or organizations: Not on file    Relationship status: Not on file  . Intimate partner violence    Fear of current or ex partner: Not on file    Emotionally abused: Not on file    Physically abused: Not on file    Forced sexual activity: Not on file  Other Topics Concern  . Not on file  Social History Narrative  . Not on file    FAMILY HISTORY:  Family History  Problem Relation Age of Onset  . Heart disease Maternal Grandmother        V. Fib arrest  . Hypertension Mother   . Hyperlipidemia Mother   . Diabetes Mother        pre-diabetes  . Hypertension Father   . Hyperlipidemia Father   . Heart disease Father   . Diabetes Father   . Cancer Father        skin  . Diabetes Sister   . Hypertension Sister   . Cancer Maternal Grandfather        lung and skin  . Colon cancer Maternal Grandfather   . Cancer Paternal Grandfather        small bowel cancer and skin  . Pleurisy Son   . Migraines Son   . Colon polyps Other        aunt and uncle    CURRENT MEDICATIONS:  Outpatient Encounter Medications as of 09/08/2019  Medication Sig  . buPROPion (WELLBUTRIN XL) 150 MG 24 hr tablet Take 2 tablets (300 mg total) by mouth daily.  . busPIRone (BUSPAR) 10 MG tablet Take 10 mg by mouth 2 (two) times daily.   . diclofenac sodium (VOLTAREN) 1 % GEL APPLY 3 GRAMS TO 3 LARGE JOINTS UP TO TID  . DULoxetine (CYMBALTA) 60 MG capsule Take 1 capsule (60 mg total) by mouth daily.  Marland Kitchen gabapentin (NEURONTIN) 300 MG capsule Take 1 capsule (300 mg  total) by mouth 2 (two) times daily.  Marland Kitchen loratadine (CLARITIN) 10 MG tablet Take 10 mg by mouth daily.  Marland Kitchen albuterol (PROAIR HFA) 108 (90 BASE) MCG/ACT inhaler Inhale 2 puffs into the lungs every 6 (six) hours as needed for wheezing.  . butalbital-acetaminophen-caffeine (FIORICET, ESGIC) 50-325-40 MG tablet Take 1 tablet by mouth every 6 (  six) hours as needed for headache.   . cyclobenzaprine (FLEXERIL) 10 MG tablet Take 1 tablet (10 mg total) by mouth 3 (three) times daily as needed for muscle spasms. (Patient not taking: Reported on 09/08/2019)  . EPINEPHrine (EPIPEN) 0.3 mg/0.3 mL DEVI Inject 0.3 mLs (0.3 mg total) into the muscle once. (Patient not taking: Reported on 09/08/2019)  . [DISCONTINUED] Diclofenac Sodium (PENNSAID) 2 % SOLN Place 2 application onto the skin 2 (two) times daily.   No facility-administered encounter medications on file as of 09/08/2019.     ALLERGIES:  Allergies  Allergen Reactions  . Cashew Nut Oil Anaphylaxis  . Cauliflower [Brassica Oleracea] Anaphylaxis  . Whey Anaphylaxis  . Augmentin [Amoxicillin-Pot Clavulanate] Diarrhea and Nausea And Vomiting    Has patient had a PCN reaction causing immediate rash, facial/tongue/throat swelling, SOB or lightheadedness with hypotension: No Has patient had a PCN reaction causing severe rash involving mucus membranes or skin necrosis: No Has patient had a PCN reaction that required hospitalization: No Has patient had a PCN reaction occurring within the last 10 years: Unknown If all of the above answers are "NO", then may proceed with Cephalosporin use.   Erskine Emery. Orange Fruit [Citrus] Other (See Comments)    Migraines   . Gluten Meal Other (See Comments)    Severe GI upset  . Milk-Related Compounds   . Other     epidural steroid injection - severe headaches, high fever  . Codeine Rash  . Tree Extract Itching    Tee tree oil     PHYSICAL EXAM:  ECOG Performance status: 1  Vitals:   09/08/19 1534  BP: 132/80   Pulse: 87  Resp: 16  Temp: 97.8 F (36.6 C)  SpO2: 97%   Filed Weights   09/08/19 1534  Weight: 274 lb 14.4 oz (124.7 kg)    Physical Exam Constitutional:      Appearance: Normal appearance. She is obese.  HENT:     Head: Normocephalic.     Right Ear: External ear normal.     Left Ear: External ear normal.     Nose: Nose normal.     Mouth/Throat:     Pharynx: Oropharynx is clear.  Eyes:     Conjunctiva/sclera: Conjunctivae normal.  Neck:     Musculoskeletal: Normal range of motion.  Cardiovascular:     Rate and Rhythm: Normal rate and regular rhythm.     Pulses: Normal pulses.     Heart sounds: Normal heart sounds.  Pulmonary:     Effort: Pulmonary effort is normal.     Breath sounds: Normal breath sounds.  Abdominal:     General: Bowel sounds are normal.  Musculoskeletal: Normal range of motion.  Skin:    General: Skin is warm.  Neurological:     General: No focal deficit present.     Mental Status: She is alert and oriented to person, place, and time.  Psychiatric:        Mood and Affect: Mood normal.        Behavior: Behavior normal.        Thought Content: Thought content normal.        Judgment: Judgment normal.      LABORATORY DATA:  I have reviewed the labs as listed.  CBC    Component Value Date/Time   WBC 8.6 09/01/2019 1526   RBC 4.39 09/01/2019 1526   HGB 13.5 09/01/2019 1526   HGB 13.7 10/13/2013 1524   HCT 41.8 09/01/2019 1526  HCT 40.9 10/13/2013 1524   PLT 326 09/01/2019 1526   PLT 270 10/13/2013 1524   MCV 95.2 09/01/2019 1526   MCV 88 10/13/2013 1524   MCH 30.8 09/01/2019 1526   MCHC 32.3 09/01/2019 1526   RDW 13.1 09/01/2019 1526   RDW 13.3 10/13/2013 1524   LYMPHSABS 2.0 09/01/2019 1526   LYMPHSABS 2.2 10/13/2013 1524   MONOABS 0.7 09/01/2019 1526   MONOABS 0.4 10/13/2013 1524   EOSABS 0.3 09/01/2019 1526   EOSABS 0.3 10/13/2013 1524   BASOSABS 0.1 09/01/2019 1526   BASOSABS 0.1 10/13/2013 1524   CMP Latest Ref Rng &  Units 09/01/2019 05/12/2019 05/06/2019  Glucose 70 - 99 mg/dL 86 133(H) 95  BUN 6 - 20 mg/dL 12 8 7   Creatinine 0.44 - 1.00 mg/dL 0.88 0.91 0.81  Sodium 135 - 145 mmol/L 139 137 138  Potassium 3.5 - 5.1 mmol/L 4.3 4.4 4.5  Chloride 98 - 111 mmol/L 102 103 105  CO2 22 - 32 mmol/L 29 26 22   Calcium 8.9 - 10.3 mg/dL 9.2 8.7(L) 9.1  Total Protein 6.5 - 8.1 g/dL 7.6 - -  Total Bilirubin 0.3 - 1.2 mg/dL 0.2(L) - -  Alkaline Phos 38 - 126 U/L 112 - -  AST 15 - 41 U/L 17 - -  ALT 0 - 44 U/L 17 - -           ASSESSMENT & PLAN:   Iron deficiency anemia 1.  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  She reportedly had gastric sleeve removed in April 2019. -On iron tablets started on January 09, 2018, currently taking 1 daily without any major side effects. - Status post Feraheme on 02/13/2018 and 02/20/2018 with improvement in symptoms (lightheadedness while walking and bending forward) - Denies any bleeding per rectum or melena.  Will check stool cards for occult blood x3.  If there is any positive test, will consider colonoscopy. - Her hemoglobin is normal.  Ferritin has decreased to 48 from 86.  She complains of fatigue. -Patient no longer has gastric band.  Labs have stabilized.  Hemoglobin is 13.5, MCV 95.2, ferritin and iron panel is within normal. -Due to normal blood counts.  Patient was given the option to continue routine labs with primary care provider or continue every 35-month lab appointments here.  Patient would like to continue with follow-ups with her primary care provider.  She will return to clinic as needed.  2. B12 deficiency: -She will continue on vitamin B12 sublingual daily.          [place] [phone]

## 2019-09-08 NOTE — Assessment & Plan Note (Signed)
1.  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  She reportedly had gastric sleeve removed in April 2019. -On iron tablets started on January 09, 2018, currently taking 1 daily without any major side effects. - Status post Feraheme on 02/13/2018 and 02/20/2018 with improvement in symptoms (lightheadedness while walking and bending forward) - Denies any bleeding per rectum or melena.  Will check stool cards for occult blood x3.  If there is any positive test, will consider colonoscopy. - Her hemoglobin is normal.  Ferritin has decreased to 48 from 86.  She complains of fatigue. -Patient no longer has gastric band.  Labs have stabilized.  Hemoglobin is 13.5, MCV 95.2, ferritin and iron panel is within normal. -Due to normal blood counts.  Patient was given the option to continue routine labs with primary care provider or continue every 36-month lab appointments here.  Patient would like to continue with follow-ups with her primary care provider.  She will return to clinic as needed.  2. B12 deficiency: -She will continue on vitamin B12 sublingual daily.

## 2019-10-06 ENCOUNTER — Ambulatory Visit: Payer: BC Managed Care – PPO | Admitting: Internal Medicine

## 2019-10-19 ENCOUNTER — Encounter: Payer: Self-pay | Admitting: Family Medicine

## 2019-10-20 DIAGNOSIS — J45909 Unspecified asthma, uncomplicated: Secondary | ICD-10-CM | POA: Insufficient documentation

## 2019-10-20 DIAGNOSIS — G43909 Migraine, unspecified, not intractable, without status migrainosus: Secondary | ICD-10-CM | POA: Insufficient documentation

## 2019-10-20 DIAGNOSIS — R87619 Unspecified abnormal cytological findings in specimens from cervix uteri: Secondary | ICD-10-CM | POA: Insufficient documentation

## 2019-10-20 DIAGNOSIS — M1990 Unspecified osteoarthritis, unspecified site: Secondary | ICD-10-CM | POA: Insufficient documentation

## 2019-10-22 ENCOUNTER — Telehealth (INDEPENDENT_AMBULATORY_CARE_PROVIDER_SITE_OTHER): Payer: BC Managed Care – PPO | Admitting: Family Medicine

## 2019-10-22 DIAGNOSIS — F419 Anxiety disorder, unspecified: Secondary | ICD-10-CM | POA: Diagnosis not present

## 2019-10-22 DIAGNOSIS — F329 Major depressive disorder, single episode, unspecified: Secondary | ICD-10-CM | POA: Diagnosis not present

## 2019-10-22 DIAGNOSIS — F32A Depression, unspecified: Secondary | ICD-10-CM

## 2019-10-22 MED ORDER — DICLOFENAC SODIUM 1 % EX GEL: 4.0000 g | Freq: Four times a day (QID) | CUTANEOUS | 1 refills | Status: AC

## 2019-10-22 MED ORDER — DULOXETINE HCL 60 MG PO CPEP: 60.0000 mg | ORAL_CAPSULE | Freq: Two times a day (BID) | ORAL | 3 refills | Status: DC

## 2019-10-22 MED ORDER — LORAZEPAM 0.5 MG PO TABS: 0.5000 mg | ORAL_TABLET | Freq: Three times a day (TID) | ORAL | 1 refills | Status: DC | PRN

## 2019-10-22 NOTE — Progress Notes (Signed)
Virtual Visit via Video   Due to the COVID-19 pandemic, this visit was completed with telemedicine (audio/video) technology to reduce patient and provider exposure as well as to preserve personal protective equipment.   I connected with [name] by a video enabled telemedicine application and verified that I am speaking with the correct person using two identifiers. Location patient: Home Location provider: [facility], Office Persons participating in the virtual visit: [name], Pam Mannan, MD   I discussed the limitations of evaluation and management by telemedicine and the availability of in person appointments. The patient expressed understanding and agreed to proceed.  Care Team   Patient Care Team: Pam Dun, MD as PCP - General (Family Medicine) Pam Bali, MD as Consulting Physician (Gastroenterology)  Subjective:   HPI:   The following mychart messages were exchanged:   Last read by [name] at 8:52 AM on 10/22/2019. October 19, 2019 Pam Sax, MD to [name], CMA     4:53 PM Try to remain calm and try some deep breathing. It sounds as though this is more related to anxiety after receiving the news about in person training starting on the `15th. Remember, that is 3 weeks away. A lot could change between now and then.   to Me     10:27 AM [employer] has decided to start in-person learning and have rescinded my ability to work remotely as of 11/13/19. After I was notified on Friday (email attached), I spent an hour sitting on the couch crying and processing it all.  I immediately felt tight in my chest when I read the email. Between the crying and my asthma I started having very short breaths and got extra anxious and panicky. I don't know exactly what to do about how anxious I am about this change. I have had two family members to die from COVID and I am terrified to say the least that I might get it or carry it to my  parents. My dad has COPD and is a disabled veteran with multiple issues so I can't really even see him. My chest is semi-tight most of the time due to allergies and if I get COVID I feel like it might be very hard for my body to fight it off. Do you have any advice about what I can do to not be as anxious? I take my meds as prescribed but still have the panicky anxiety attacks at times. I appreciate any advice.  Current medications had been working until covid occurred. Depression screen [facility] 2/9 07/09/2019 04/09/2019 01/08/2019  Decreased Interest 0 1 3  Down, Depressed, Hopeless 0 1 3  PHQ - 2 Score 0 2 6  Altered sleeping - 2 2  Tired, decreased energy - 1 3  Change in appetite - 2 3  Feeling bad or failure about yourself  - 1 3  Trouble concentrating - 0 2  Moving slowly or fidgety/restless - 0 1  Suicidal thoughts - 0 0  PHQ-9 Score - 8 20  Difficult doing work/chores - Not difficult at all Very difficult   GAD 7 : Generalized Anxiety Score 10/22/2019 01/08/2019  Nervous, Anxious, on Edge 2 3  Control/stop worrying 1 2  Worry too much - different things 2 1  Trouble relaxing 1 2  Restless 1 0  Easily annoyed or irritable 3 3  Afraid - awful might happen 1 0  Total GAD 7 Score 11 11  Anxiety Difficulty Somewhat difficult Very difficult      Review of Systems  Constitutional: Negative.  Negative for fever and malaise/fatigue.  HENT: Negative.  Negative for congestion and hearing loss.   Eyes: Negative.  Negative for blurred vision, discharge and redness.  Respiratory: Negative.  Negative for cough and shortness of breath.   Cardiovascular: Negative.  Negative for chest pain, palpitations and leg swelling.  Gastrointestinal: Negative for abdominal pain and heartburn.  Genitourinary: Negative.  Negative for dysuria.  Musculoskeletal: Positive for back pain. Negative for falls, myalgias and neck pain.  Skin: Negative.  Negative for rash.  Neurological: Negative.  Negative for loss  of consciousness and headaches.  Endo/Heme/Allergies: Negative.  Does not bruise/bleed easily.  Psychiatric/Behavioral: Negative for depression, hallucinations, memory loss, substance abuse and suicidal ideas. The patient is nervous/anxious. The patient does not have insomnia.   All other systems reviewed and are negative.    Patient Active Problem List   Diagnosis Date Noted  . Snoring 07/09/2019  . Degenerative scoliosis in adult patient 05/11/2019  . Anxiety and depression 04/08/2019  . Fibromyalgia 04/08/2019  . Primary osteoarthritis of both knees 09/17/2018  . Primary osteoarthritis of both hands 09/17/2018  . DDD (degenerative disc disease), lumbar 08/27/2018  . Family history of rheumatoid arthritis 08/27/2018  . Flat foot 08/27/2018  . Multiple food allergies 07/28/2018  . Family history of celiac disease 07/28/2018  . Iron deficiency anemia 02/07/2018  . History of removal of laparoscopic gastric banding device 02/03/2018  . Anemia 01/13/2018  . H/O laparoscopic adjustable gastric banding 07/29/2013  . Bilateral ovarian cysts 11/13/2012  . Major depression, recurrent, chronic (HCC) 01/22/2011  . Decreased sex drive 16/07/9603  . Morbid obesity (HCC) 02/13/2010  . OTHER MALAISE AND FATIGUE 12/20/2008    Social History   Tobacco Use  . Smoking status: Never Smoker  . Smokeless tobacco: Never Used  Substance Use Topics  . Alcohol use: No    Current Outpatient Medications:  .  albuterol (PROAIR HFA) 108 (90 BASE) MCG/ACT inhaler, Inhale 2 puffs into the lungs every 6 (six) hours as needed for wheezing., Disp: , Rfl:  .  buPROPion (WELLBUTRIN XL) 150 MG 24 hr tablet, Take 2 tablets (300 mg total) by mouth daily., Disp: 60 tablet, Rfl: 3 .  busPIRone (BUSPAR) 10 MG tablet, Take 10 mg by mouth 2 (two) times daily. , Disp: , Rfl:  .  butalbital-acetaminophen-caffeine (FIORICET, ESGIC) 50-325-40 MG tablet, Take 1 tablet by mouth every 6 (six) hours as needed for headache. ,  Disp: , Rfl:  .  cyclobenzaprine (FLEXERIL) 10 MG tablet, Take 1 tablet (10 mg total) by mouth 3 (three) times daily as needed for muscle spasms. (Patient not taking: Reported on 09/08/2019), Disp: 50 tablet, Rfl: 0 .  diclofenac sodium (VOLTAREN) 1 % GEL, APPLY 3 GRAMS TO 3 LARGE JOINTS UP TO TID, Disp: , Rfl:  .  DULoxetine (CYMBALTA) 60 MG capsule, Take 1 capsule (60 mg total) by mouth 2 (two) times daily., Disp: 180 capsule, Rfl: 3 .  EPINEPHrine (EPIPEN) 0.3 mg/0.3 mL DEVI, Inject 0.3 mLs (0.3 mg total) into the muscle once. (Patient not taking: Reported on 09/08/2019), Disp: 1 Device, Rfl: 0 .  gabapentin (NEURONTIN) 300 MG capsule, Take 1 capsule (300 mg total) by mouth 2 (two) times daily., Disp: 60 capsule, Rfl: 3 .  loratadine (CLARITIN) 10 MG tablet, Take 10 mg by mouth daily., Disp: , Rfl:   Allergies  Allergen Reactions  . Cashew Nut  Oil Anaphylaxis  . Cauliflower [Brassica Oleracea] Anaphylaxis  . Whey Anaphylaxis  . Augmentin [Amoxicillin-Pot Clavulanate] Diarrhea and Nausea And Vomiting    Has patient had a PCN reaction causing immediate rash, facial/tongue/throat swelling, SOB or lightheadedness with hypotension: No Has patient had a PCN reaction causing severe rash involving mucus membranes or skin necrosis: No Has patient had a PCN reaction that required hospitalization: No Has patient had a PCN reaction occurring within the last 10 years: Unknown If all of the above answers are "NO", then may proceed with Cephalosporin use.   Erskine Emery. Orange Fruit [Citrus] Other (See Comments)    Migraines   . Gluten Meal Other (See Comments)    Severe GI upset  . Milk-Related Compounds   . Other     epidural steroid injection - severe headaches, high fever  . Codeine Rash  . Tree Extract Itching    Tee tree oil    Objective:  LMP 11/07/2012   VITALS: Per patient if applicable, see vitals. GENERAL: Alert, appears well and in no acute distress. HEENT: Atraumatic, conjunctiva clear, no  obvious abnormalities on inspection of external nose and ears. NECK: Normal movements of the head and neck. CARDIOPULMONARY: No increased WOB. Speaking in clear sentences. I:E ratio WNL.  MS: Moves all visible extremities without noticeable abnormality. PSYCH: Pleasant and cooperative, well-groomed. Speech normal rate and rhythm. Affect is appropriate. Insight and judgement are appropriate. Attention is focused, linear, and appropriate.  NEURO: CN grossly intact. Oriented as arrived to appointment on time with no prompting. Moves both UE equally.  SKIN: No obvious lesions, wounds, erythema, or cyanosis noted on face or hands.  [facility] 07/09/2019 04/09/2019 01/08/2019  Decreased Interest 0 1 3  Down, Depressed, Hopeless 0 1 3  PHQ - 2 Score 0 2 6  Altered sleeping - 2 2  Tired, decreased energy - 1 3  Change in appetite - 2 3  Feeling bad or failure about yourself  - 1 3  Trouble concentrating - 0 2  Moving slowly or fidgety/restless - 0 1  Suicidal thoughts - 0 0  PHQ-9 Score - 8 20  Difficult doing work/chores - Not difficult at all Very difficult     . COVID-19 Education: The signs and symptoms of COVID-19 were discussed with the patient and how to seek care for testing if needed. The importance of social distancing was discussed today. . Reviewed expectations re: course of current medical issues. . Discussed self-management of symptoms. . Outlined signs and symptoms indicating need for more acute intervention. . Patient verbalized understanding and all questions were answered. Marland Kitchen. Health Maintenance issues including appropriate healthy diet, exercise, and smoking avoidance were discussed with patient. . See orders for this visit as documented in the electronic medical record.  Pam Mannanalia Katilynn Sinkler, MD  Records requested if needed. Time spent: 40 minutes, of which >50% was spent in obtaining information about her symptoms, reviewing her previous labs, evaluations, and  treatments, counseling her about her condition (please see the discussed topics above), and developing a plan to further investigate it; she had a number of questions which I addressed.   Lab Results  Component Value Date   WBC 8.6 09/01/2019   HGB 13.5 09/01/2019   HCT 41.8 09/01/2019   PLT 326 09/01/2019   GLUCOSE 86 09/01/2019   CHOL 198 12/20/2008   TRIG 111 12/20/2008   HDL 37.4 (L) 12/20/2008   LDLCALC 138 (H) 12/20/2008   ALT 17 09/01/2019   AST  17 09/01/2019   NA 139 09/01/2019   K 4.3 09/01/2019   CL 102 09/01/2019   CREATININE 0.88 09/01/2019   BUN 12 09/01/2019   CO2 29 09/01/2019   TSH 2.06 08/27/2018   HGBA1C 5.4 02/25/2013    Lab Results  Component Value Date   TSH 2.06 08/27/2018   Lab Results  Component Value Date   WBC 8.6 09/01/2019   HGB 13.5 09/01/2019   HCT 41.8 09/01/2019   MCV 95.2 09/01/2019   PLT 326 09/01/2019   Lab Results  Component Value Date   NA 139 09/01/2019   K 4.3 09/01/2019   CO2 29 09/01/2019   GLUCOSE 86 09/01/2019   BUN 12 09/01/2019   CREATININE 0.88 09/01/2019   BILITOT 0.2 (L) 09/01/2019   ALKPHOS 112 09/01/2019   AST 17 09/01/2019   ALT 17 09/01/2019   PROT 7.6 09/01/2019   ALBUMIN 4.1 09/01/2019   CALCIUM 9.2 09/01/2019   ANIONGAP 8 09/01/2019   GFR 82.62 02/25/2013   Lab Results  Component Value Date   CHOL 198 12/20/2008   Lab Results  Component Value Date   HDL 37.4 (L) 12/20/2008   Lab Results  Component Value Date   LDLCALC 138 (H) 12/20/2008   Lab Results  Component Value Date   TRIG 111 12/20/2008   Lab Results  Component Value Date   CHOLHDL 5.3 CALC 12/20/2008   Lab Results  Component Value Date   HGBA1C 5.4 02/25/2013       Assessment & Plan:   Problem List Items Addressed This Visit      Active Problems   Anxiety and depression - Primary   Relevant Medications   DULoxetine (CYMBALTA) 60 MG capsule      I have changed [name]'s DULoxetine. I am also having her  maintain her EPINEPHrine, albuterol, butalbital-acetaminophen-caffeine, busPIRone, buPROPion, cyclobenzaprine, gabapentin, loratadine, and diclofenac sodium.  Meds ordered this encounter  Medications  . DULoxetine (CYMBALTA) 60 MG capsule    Sig: Take 1 capsule (60 mg total) by mouth 2 (two) times daily.    Dispense:  180 capsule    Refill:  3     Arnette Norris, MD

## 2019-10-22 NOTE — Assessment & Plan Note (Signed)
Assessment: Current symptoms: panic attacks, cant stop controlling worrying. No current suicidal and homicidal ideation. Side effects from treatment: none.  Plan: 1. Medications:buspar 10 mg twice daily. Wellbutrin and Cymbalta 60 mg daily. Add lorazepam 0.5 prn panic attacks.  Discussed sedation and addiction potential. 2. Discussed potential risks, expected benefits, possible side effects of the medicine. We also discussed how to take it correctly and dosing instructions.  2. Increase cymbalta to 60 mg twice daily to to back pain and increased anxiety. 3. Deferring psychotherapy at this time. 4. Feels better since they are making more safety considerations.  Call or send my chart message prn if these symptoms worsen or fail to improve as anticipated. The patient indicates understanding of these issues and agrees with the plan.

## 2019-12-01 ENCOUNTER — Other Ambulatory Visit: Payer: Self-pay

## 2019-12-01 MED ORDER — GABAPENTIN 300 MG PO CAPS: 300.0000 mg | ORAL_CAPSULE | Freq: Two times a day (BID) | ORAL | 11 refills | Status: DC

## 2019-12-25 NOTE — Progress Notes (Deleted)
Office Visit Note  Patient: [name]             Date of Birth: 1976/08/06           MRN: [id number]             PCP: Dianne Dun, MD Referring: Dianne Dun, MD Visit Date: 12/31/2019 Occupation: @GUAROCC @  Subjective:  No chief complaint on file.   History of Present Illness: [name] is a 44 y.o. female ***   Activities of Daily Living:  Patient reports morning stiffness for *** {minute/hour:19697}.   Patient {ACTIONS;DENIES/REPORTS:21021675::"Denies"} nocturnal pain.  Difficulty dressing/grooming: {ACTIONS;DENIES/REPORTS:21021675::"Denies"} Difficulty climbing stairs: {ACTIONS;DENIES/REPORTS:21021675::"Denies"} Difficulty getting out of chair: {ACTIONS;DENIES/REPORTS:21021675::"Denies"} Difficulty using hands for taps, buttons, cutlery, and/or writing: {ACTIONS;DENIES/REPORTS:21021675::"Denies"}  No Rheumatology ROS completed.   PMFS History:  Patient Active Problem List   Diagnosis Date Noted  . Snoring 07/09/2019  . Degenerative scoliosis in adult patient 05/11/2019  . Anxiety and depression 04/08/2019  . Fibromyalgia 04/08/2019  . Primary osteoarthritis of both knees 09/17/2018  . Primary osteoarthritis of both hands 09/17/2018  . DDD (degenerative disc disease), lumbar 08/27/2018  . Family history of rheumatoid arthritis 08/27/2018  . Flat foot 08/27/2018  . Multiple food allergies 07/28/2018  . Family history of celiac disease 07/28/2018  . Iron deficiency anemia 02/07/2018  . History of removal of laparoscopic gastric banding device 02/03/2018  . Anemia 01/13/2018  . H/O laparoscopic adjustable gastric banding 07/29/2013  . Bilateral ovarian cysts 11/13/2012  . Major depression, recurrent, chronic (HCC) 01/22/2011  . Decreased sex drive 01/24/2011  . Morbid obesity (HCC) 02/13/2010  . OTHER MALAISE AND FATIGUE 12/20/2008    Past Medical History:  Diagnosis Date  . Anemia   . Arthritis   . Asthma    EXERCISE INDUCED  . Bursitis of  hip   . Depression   . Family history of adverse reaction to anesthesia    patient states father had a reaction to anesthesia about 20 years ago where his face and throat swelled, had to be reintubated."  . Generalized headaches   . Irregular menses   . Obesity (BMI 30-39.9)     Family History  Problem Relation Age of Onset  . Heart disease Maternal Grandmother        V. Fib arrest  . Hypertension Mother   . Hyperlipidemia Mother   . Diabetes Mother        pre-diabetes  . Hypertension Father   . Hyperlipidemia Father   . Heart disease Father   . Diabetes Father   . Cancer Father        skin  . Diabetes Sister   . Hypertension Sister   . Cancer Maternal Grandfather        lung and skin  . Colon cancer Maternal Grandfather   . Cancer Paternal Grandfather        small bowel cancer and skin  . Pleurisy Son   . Migraines Son   . Colon polyps Other        aunt and uncle   Past Surgical History:  Procedure Laterality Date  . ABDOMINAL HYSTERECTOMY    . ACHILLES TENDON LENGTHENING  1989  . ANKLE GANGLION CYST EXCISION  02/2012   left ankle  . ANTERIOR LAT LUMBAR FUSION Right 05/11/2019   Procedure: ANTERIOR LATERAL LUMBAR INTERBODY FUSION, LATERAL INSTRUMENTATION, RIGHT LUMBAR TWO- LUMBAR THREE;  Surgeon: 05/13/2019, MD;  Location: [facility] OR;  Service: Neurosurgery;  Laterality: Right;  ANTERIOR LATERAL LUMBAR INTERBODY FUSION, LATERAL INSTRUMENTATION, RIGHT LUMBAR TWO- LUMBAR THREE  . BIOPSY  03/16/2019   Procedure: BIOPSY;  Surgeon: Daneil Dolin, MD;  Location: [facility];  Service: Endoscopy;;  Gastric   . COLONOSCOPY WITH PROPOFOL N/A 03/16/2019   Dr. Gala Romney: Normal terminal ileum, 15 cm.  Normal colon.  Next colonoscopy 10 years  . DILATION AND CURETTAGE OF UTERUS  2003  . ENTEROSCOPY N/A 04/02/2019   Dr. Gala Romney: bulbar erosions, jejunal ulcerations, attempted biopsy but scope fell backwards and lesions cannot be reidentified.  No evidence of ampullary lesion or mass.    . ESOPHAGOGASTRODUODENOSCOPY (EGD) WITH PROPOFOL N/A 03/16/2019   Dr. Gala Romney: Large hiatal hernia, Cameron/antral erosions, duodenal erosions.  Gastric biopsy showed reactive gastropathy, mild chronic gastritis, negative for H. pylori  . FEET SURGERY  1990   INSERTION OF BONE GRAFT IN FEET  . GIVENS CAPSULE STUDY N/A 03/16/2019   Procedure: GIVENS CAPSULE STUDY;  Surgeon: Daneil Dolin, MD;  Location: [facility];  Service: Endoscopy;  Laterality: N/A;  . LAPAROSCOPIC GASTRIC BANDING  10/17/10   Dr Greer Pickerel; [facility]  . LAPAROSCOPIC REPAIR AND REMOVAL OF GASTRIC BAND  01/2018  . LIPOMA EXCISION  02/2006   abdomen   Social History   Social History Narrative  . Not on file   Immunization History  Administered Date(s) Administered  . Influenza Inj Mdck Quad Pf 09/23/2018  . Influenza,inj,quad, With Preservative 08/29/2017  . Influenza-Unspecified 09/12/2018  . Tdap 01/13/2018     Objective: Vital Signs: LMP 11/07/2012    Physical Exam   Musculoskeletal Exam: ***  CDAI Exam: CDAI Score: -- Patient Global: --; Provider Global: -- Swollen: --; Tender: -- Joint Exam 12/31/2019   No joint exam has been documented for this visit   There is currently no information documented on the homunculus. Go to the Rheumatology activity and complete the homunculus joint exam.  Investigation: No additional findings.  Imaging: No results found.  Recent Labs: Lab Results  Component Value Date   WBC 8.6 09/01/2019   HGB 13.5 09/01/2019   PLT 326 09/01/2019   NA 139 09/01/2019   K 4.3 09/01/2019   CL 102 09/01/2019   CO2 29 09/01/2019   GLUCOSE 86 09/01/2019   BUN 12 09/01/2019   CREATININE 0.88 09/01/2019   BILITOT 0.2 (L) 09/01/2019   ALKPHOS 112 09/01/2019   AST 17 09/01/2019   ALT 17 09/01/2019   PROT 7.6 09/01/2019   ALBUMIN 4.1 09/01/2019   CALCIUM 9.2 09/01/2019   GFRAA >60 09/01/2019    Speciality Comments: No specialty comments available.  Procedures:   No procedures performed Allergies: Cashew nut oil, Cauliflower [brassica oleracea], Whey, Augmentin [amoxicillin-pot clavulanate], Orange fruit [citrus], Gluten meal, Milk-related compounds, Other, Codeine, and Tree extract   Assessment / Plan:     Visit Diagnoses: No diagnosis found.  Orders: No orders of the defined types were placed in this encounter.  No orders of the defined types were placed in this encounter.   Face-to-face time spent with patient was *** minutes. Greater than 50% of time was spent in counseling and coordination of care.  Follow-Up Instructions: No follow-ups on file.   Earnestine Mealing, CMA  Note - This record has been created using Editor, commissioning.  Chart creation errors have been sought, but may not always  have been located. Such creation errors do not reflect on  the standard of medical care.

## 2019-12-28 ENCOUNTER — Encounter: Payer: Self-pay | Admitting: Rheumatology

## 2019-12-31 ENCOUNTER — Ambulatory Visit: Payer: BC Managed Care – PPO | Admitting: Rheumatology

## 2020-01-12 ENCOUNTER — Encounter: Payer: Self-pay | Admitting: Rheumatology

## 2020-01-26 ENCOUNTER — Other Ambulatory Visit: Payer: Self-pay

## 2020-01-26 MED ORDER — BUPROPION HCL ER (XL) 150 MG PO TB24: ORAL_TABLET | ORAL | 2 refills | Status: DC

## 2020-05-03 ENCOUNTER — Telehealth (INDEPENDENT_AMBULATORY_CARE_PROVIDER_SITE_OTHER): Payer: BC Managed Care – PPO | Admitting: Family Medicine

## 2020-05-03 ENCOUNTER — Encounter: Payer: BC Managed Care – PPO | Admitting: Family Medicine

## 2020-05-03 ENCOUNTER — Encounter: Payer: Self-pay | Admitting: Family Medicine

## 2020-05-03 ENCOUNTER — Other Ambulatory Visit: Payer: Self-pay

## 2020-05-03 DIAGNOSIS — M797 Fibromyalgia: Secondary | ICD-10-CM | POA: Diagnosis not present

## 2020-05-03 DIAGNOSIS — F329 Major depressive disorder, single episode, unspecified: Secondary | ICD-10-CM | POA: Diagnosis not present

## 2020-05-03 DIAGNOSIS — F419 Anxiety disorder, unspecified: Secondary | ICD-10-CM | POA: Diagnosis not present

## 2020-05-03 DIAGNOSIS — F32A Depression, unspecified: Secondary | ICD-10-CM

## 2020-05-03 MED ORDER — DULOXETINE HCL 60 MG PO CPEP: 60.0000 mg | ORAL_CAPSULE | Freq: Two times a day (BID) | ORAL | 3 refills | Status: DC

## 2020-05-03 MED ORDER — BUTALBITAL-APAP-CAFFEINE 50-325-40 MG PO TABS: 1.0000 | ORAL_TABLET | Freq: Four times a day (QID) | ORAL | 0 refills | Status: DC | PRN

## 2020-05-03 NOTE — Patient Instructions (Signed)
[email or web address] 

## 2020-05-03 NOTE — Progress Notes (Signed)
Virtual Visit via Video Note  I connected with [name] on 05/03/20 at 10:00 AM EDT by a video enabled telemedicine application and verified that I am speaking with the correct person using two identifiers. Location patient: car Location provider: work  Persons participating in the virtual visit: patient, provider  I discussed the limitations of evaluation and management by telemedicine and the availability of in person appointments. The patient expressed understanding and agreed to proceed.  Chief Complaint  Patient presents with  . Medication Refill  . Cyst    Pt c/o knot under arm, lt side.  Size of a marble and hard, it has had fluid leaking from it x 4days.  . Weight Loss    Pt would like to discuss weight lost.     HPI: [name] is a 44 y.o. female seen virtually today for multiple issues: 1. Pt notes a "knot" in her Lt axilla x 4 days. She was able to express some fluid last night. No fever, chills. Pt used some sav ointment with a bandaid for a few days without change.  2. She has a h/o anxiety and depression and is on wellbutrin 150mg  BID (she has been off of this x 1 week), buspar 10mg  BID, cymbalta 60mg  daily, and PRN ativan 0.5mg . She was to increase to cymbalta to 60mg  BID in 09/2019 but did not. She notes increased fibromyalgia symptoms recently. 3. Pt has weight loss surgery (lap band) but then d/t complications had to have it removed. Due to pain (back, fibromyalgia, etc) she is not able to be active. She has lost 7lbs but is frustrated by this. She states she does not know what to do. She is having cheerios with almond milk for breaktfast, salad for lunch, fruit for snack. She limits gluten.  4. Husband with cough and pt with GI symptoms and sore throat, stuffy nose this past weekend. Some improvement. Pt has been vaccinated.   Depression screen [facility] 2/9 05/03/2020 07/09/2019 04/09/2019  Decreased Interest 0 0 1  Down, Depressed, Hopeless 1 0 1  PHQ - 2 Score 1  0 2  Altered sleeping 3 - 2  Tired, decreased energy 2 - 1  Change in appetite 1 - 2  Feeling bad or failure about yourself  2 - 1  Trouble concentrating 0 - 0  Moving slowly or fidgety/restless 0 - 0  Suicidal thoughts 0 - 0  PHQ-9 Score 9 - 8  Difficult doing work/chores Somewhat difficult - Not difficult at all   GAD 7 : Generalized Anxiety Score 10/22/2019 01/08/2019  Nervous, Anxious, on Edge 2 3  Control/stop worrying 1 2  Worry too much - different things 2 1  Trouble relaxing 1 2  Restless 1 0  Easily annoyed or irritable 3 3  Afraid - awful might happen 1 0  Total GAD 7 Score 11 11  Anxiety Difficulty Somewhat difficult Very difficult    Past Medical History:  Diagnosis Date  . Anemia   . Arthritis   . Asthma    EXERCISE INDUCED  . Bursitis of hip   . Depression   . Family history of adverse reaction to anesthesia    patient states father had a reaction to anesthesia about 20 years ago where his face and throat swelled, had to be reintubated."  . Generalized headaches   . Irregular menses   . Obesity (BMI 30-39.9)     Past Surgical History:  Procedure Laterality Date  . ABDOMINAL HYSTERECTOMY    .  ACHILLES TENDON LENGTHENING  1989  . ANKLE GANGLION CYST EXCISION  02/2012   left ankle  . ANTERIOR LAT LUMBAR FUSION Right 05/11/2019   Procedure: ANTERIOR LATERAL LUMBAR INTERBODY FUSION, LATERAL INSTRUMENTATION, RIGHT LUMBAR TWO- LUMBAR THREE;  Surgeon: Tressie Stalker, MD;  Location: [facility] OR;  Service: Neurosurgery;  Laterality: Right;  ANTERIOR LATERAL LUMBAR INTERBODY FUSION, LATERAL INSTRUMENTATION, RIGHT LUMBAR TWO- LUMBAR THREE  . BIOPSY  03/16/2019   Procedure: BIOPSY;  Surgeon: Corbin Ade, MD;  [facility];  Service: Endoscopy;;  Gastric   . COLONOSCOPY WITH PROPOFOL N/A 03/16/2019   Dr. Jena Gauss: Normal terminal ileum, 15 cm.  Normal colon.  Next colonoscopy 10 years  . DILATION AND CURETTAGE OF UTERUS  2003  . ENTEROSCOPY N/A 04/02/2019   Dr.  Jena Gauss: bulbar erosions, jejunal ulcerations, attempted biopsy but scope fell backwards and lesions cannot be reidentified.  No evidence of ampullary lesion or mass.  . ESOPHAGOGASTRODUODENOSCOPY (EGD) WITH PROPOFOL N/A 03/16/2019   Dr. Jena Gauss: Large hiatal hernia, Cameron/antral erosions, duodenal erosions.  Gastric biopsy showed reactive gastropathy, mild chronic gastritis, negative for H. pylori  . FEET SURGERY  1990   INSERTION OF BONE GRAFT IN FEET  . GIVENS CAPSULE STUDY N/A 03/16/2019   Procedure: GIVENS CAPSULE STUDY;  Surgeon: Corbin Ade, MD;  [facility];  Service: Endoscopy;  Laterality: N/A;  . LAPAROSCOPIC GASTRIC BANDING  10/17/10   Dr Gaynelle Adu; [facility]  . LAPAROSCOPIC REPAIR AND REMOVAL OF GASTRIC BAND  01/2018  . LIPOMA EXCISION  02/2006   abdomen    Family History  Problem Relation Age of Onset  . Heart disease Maternal Grandmother        V. Fib arrest  . Hypertension Mother   . Hyperlipidemia Mother   . Diabetes Mother        pre-diabetes  . Hypertension Father   . Hyperlipidemia Father   . Heart disease Father   . Diabetes Father   . Cancer Father        skin  . Diabetes Sister   . Hypertension Sister   . Cancer Maternal Grandfather        lung and skin  . Colon cancer Maternal Grandfather   . Cancer Paternal Grandfather        small bowel cancer and skin  . Pleurisy Son   . Migraines Son   . Colon polyps Other        aunt and uncle    Social History   Tobacco Use  . Smoking status: Never Smoker  . Smokeless tobacco: Never Used  Vaping Use  . Vaping Use: Never used  Substance Use Topics  . Alcohol use: No  . Drug use: No     Current Outpatient Medications:  .  albuterol (PROAIR HFA) 108 (90 BASE) MCG/ACT inhaler, Inhale 2 puffs into the lungs every 6 (six) hours as needed for wheezing., Disp: , Rfl:  .  buPROPion (WELLBUTRIN XL) 150 MG 24 hr tablet, Take 1 bid (Plz sched appt with new provider) (Patient taking differently:  Take 150 mg by mouth 2 times daily at 12 noon and 4 pm. ), Disp: 60 tablet, Rfl: 2 .  busPIRone (BUSPAR) 10 MG tablet, Take 10 mg by mouth 2 (two) times daily. , Disp: , Rfl:  .  butalbital-acetaminophen-caffeine (FIORICET) 50-325-40 MG tablet, Take 1 tablet by mouth every 6 (six) hours as needed for headache., Disp: 30 tablet, Rfl: 0 .  DULoxetine (CYMBALTA) 60  MG capsule, Take 1 capsule (60 mg total) by mouth 2 (two) times daily., Disp: 180 capsule, Rfl: 3 .  EPINEPHrine (EPIPEN) 0.3 mg/0.3 mL DEVI, Inject 0.3 mLs (0.3 mg total) into the muscle once., Disp: 1 Device, Rfl: 0 .  gabapentin (NEURONTIN) 300 MG capsule, Take 1 capsule (300 mg total) by mouth 2 (two) times daily., Disp: 60 capsule, Rfl: 11 .  loratadine (CLARITIN) 10 MG tablet, Take 10 mg by mouth daily., Disp: , Rfl:  .  LORazepam (ATIVAN) 0.5 MG tablet, Take 1 tablet (0.5 mg total) by mouth every 8 (eight) hours as needed for anxiety., Disp: 30 tablet, Rfl: 1 .  cyclobenzaprine (FLEXERIL) 10 MG tablet, Take 1 tablet (10 mg total) by mouth 3 (three) times daily as needed for muscle spasms. (Patient not taking: Reported on 09/08/2019), Disp: 50 tablet, Rfl: 0  Allergies  Allergen Reactions  . Cashew Nut Oil Anaphylaxis  . Cauliflower [Brassica Oleracea] Anaphylaxis  . Whey Anaphylaxis  . Augmentin [Amoxicillin-Pot Clavulanate] Diarrhea and Nausea And Vomiting    Has patient had a PCN reaction causing immediate rash, facial/tongue/throat swelling, SOB or lightheadedness with hypotension: No Has patient had a PCN reaction causing severe rash involving mucus membranes or skin necrosis: No Has patient had a PCN reaction that required hospitalization: No Has patient had a PCN reaction occurring within the last 10 years: Unknown If all of the above answers are "NO", then may proceed with Cephalosporin use.   Erskine Emery Fruit [Citrus] Other (See Comments)    Migraines   . Gluten Meal Other (See Comments)    Severe GI upset  .  Milk-Related Compounds   . Other     epidural steroid injection - severe headaches, high fever  . Codeine Rash  . Tree Extract Itching    Tee tree oil      ROS: See pertinent positives and negatives per HPI.   EXAM:  VITALS per patient if applicable: LMP 11/07/2012    GENERAL: alert, oriented, appears well and in no acute distress  HEENT: atraumatic, conjunctiva clear, no obvious abnormalities on inspection of external nose and ears  NECK: normal movements of the head and neck  LUNGS: on inspection no signs of respiratory distress, breathing rate appears normal, no obvious gross SOB, gasping or wheezing, no conversational dyspnea  CV: no obvious cyanosis  MS: moves all visible extremities without noticeable abnormality  PSYCH/NEURO: pleasant and cooperative,  speech and thought processing grossly intact   ASSESSMENT AND PLAN:  1. Fibromyalgia Increase: - DULoxetine (CYMBALTA) 60 MG capsule; Take 1 capsule (60 mg total) by mouth 2 (two) times daily.  Dispense: 180 capsule; Refill: 3 - f/u in 3-4 wks  2. Anxiety and depression - cont with buspar 10mg  BID Increase: - DULoxetine (CYMBALTA) 60 MG capsule; Take 1 capsule (60 mg total) by mouth 2 (two) times daily.  Dispense: 180 capsule; Refill: 3 - will cont to hold wellbutrin at this time - pt has been off x 1+ wk - f/u in 3-4 wks or sooner PRN  3. Morbid obesity (HCC) - Amb Ref to Medical Weight Management  Pt would like to establish care with [facility] d/t location and will message to Dr. [facility]    I discussed the assessment and treatment plan with the patient. The patient was provided an opportunity to ask questions and all were answered. The patient agreed with the plan and demonstrated an understanding of the instructions.   The patient was advised to call  back or seek an in-person evaluation if the symptoms worsen or if the condition fails to improve as anticipated.   Letta Median, DO

## 2020-05-03 NOTE — Progress Notes (Signed)
Encounter opened in error

## 2020-05-13 ENCOUNTER — Other Ambulatory Visit: Payer: Self-pay

## 2020-05-13 ENCOUNTER — Encounter: Payer: Self-pay | Admitting: Internal Medicine

## 2020-05-13 ENCOUNTER — Ambulatory Visit (INDEPENDENT_AMBULATORY_CARE_PROVIDER_SITE_OTHER): Payer: BC Managed Care – PPO | Admitting: Internal Medicine

## 2020-05-13 VITALS — BP 137/83 | HR 98 | Resp 16 | Ht 65.5 in | Wt 306.1 lb

## 2020-05-13 DIAGNOSIS — F339 Major depressive disorder, recurrent, unspecified: Secondary | ICD-10-CM | POA: Diagnosis not present

## 2020-05-13 DIAGNOSIS — M797 Fibromyalgia: Secondary | ICD-10-CM | POA: Diagnosis not present

## 2020-05-13 DIAGNOSIS — J4520 Mild intermittent asthma, uncomplicated: Secondary | ICD-10-CM

## 2020-05-13 DIAGNOSIS — M545 Low back pain: Secondary | ICD-10-CM

## 2020-05-13 DIAGNOSIS — Z1159 Encounter for screening for other viral diseases: Secondary | ICD-10-CM

## 2020-05-13 DIAGNOSIS — Z708 Other sex counseling: Secondary | ICD-10-CM

## 2020-05-13 DIAGNOSIS — Z91018 Allergy to other foods: Secondary | ICD-10-CM

## 2020-05-13 DIAGNOSIS — G8929 Other chronic pain: Secondary | ICD-10-CM

## 2020-05-13 DIAGNOSIS — G43009 Migraine without aura, not intractable, without status migrainosus: Secondary | ICD-10-CM

## 2020-05-13 DIAGNOSIS — Z7689 Persons encountering health services in other specified circumstances: Secondary | ICD-10-CM

## 2020-05-13 NOTE — Patient Instructions (Addendum)
Check CBC, CMP, A1c, HIV & Hepatitis C, TSH today Follow up in 3 months

## 2020-05-13 NOTE — Progress Notes (Signed)
New Patient Office Visit  Subjective:  Patient ID: Pam BEAIRD, female    DOB: 07-25-1976  Age: 44 y.o. MRN: [id number]  CC:  Chief Complaint  Patient presents with  . New Patient (Initial Visit)    establish care    HPI [name] is very pleasant 44 year old female with past medical history of depression, fibromyalgia, chronic back pain status post anterior lateral lumbar fusion in May 11, 2019, morbid obesity status post laparoscopic gastric banding in 2012, mild intermittent asthma presents in our clinic for the first time for establishment of care and discuss about her chronic medical issues.  Patient underwent lap band but due to complication had to have it removed.  Tells me that since her gastric surgery in 2012 she started having food allergy which includes honey, oranges, gluten, whey protein, cheese, cauliflower, cashew nuts-she is followed by nutritionist for dietary plan.  She is working on her diet to lose weight.  Her diet is very limited due to multiple food allergy.  She has chronic back pain.  She underwent lumbar fusion surgery in July 2020.  Reports that her back pain has improved but not resolved yet.  She has fibromyalgia for which she is followed by rheumatologist at [facility].  She takes Cymbalta 60 mg twice daily for back pain, fibromyalgia and depression.  She also takes BuSpar 10 mg twice daily for depression and Ativan as needed for anxiety.  She denies depressed mood, suicidal or homicidal thoughts.  His mild intermittent asthma for which she takes albuterol as needed.  Has history of migraine for which she takes Fioricet as needed.  She is up-to-date on mammogram and Pap smear.  Her last mammogram was last year which came back negative.  She had Pap smear 2 years ago which was also negative.  She is up-to-date on all immunizations including COVID-19 vaccine.  She has strong family history of diabetes in her dad, sister, uncle 7 and and hypo and  hyperthyroidism and 2 aunts and uncles and requested to check for diabetes and thyroid issues.  No history of smoking, alcohol, illicit drug use.  Past Medical History:  Diagnosis Date  . Anemia   . Arthritis   . Asthma    EXERCISE INDUCED  . Bursitis of hip   . Depression   . Family history of adverse reaction to anesthesia    patient states father had a reaction to anesthesia about 20 years ago where his face and throat swelled, had to be reintubated."  . Generalized headaches   . Irregular menses   . Obesity (BMI 30-39.9)     Past Surgical History:  Procedure Laterality Date  . ABDOMINAL HYSTERECTOMY    . ACHILLES TENDON LENGTHENING  1989  . ANKLE GANGLION CYST EXCISION  02/2012   left ankle  . ANTERIOR LAT LUMBAR FUSION Right 05/11/2019   Procedure: ANTERIOR LATERAL LUMBAR INTERBODY FUSION, LATERAL INSTRUMENTATION, RIGHT LUMBAR TWO- LUMBAR THREE;  Surgeon: Tressie Stalker, MD;  Location: [facility] OR;  Service: Neurosurgery;  Laterality: Right;  ANTERIOR LATERAL LUMBAR INTERBODY FUSION, LATERAL INSTRUMENTATION, RIGHT LUMBAR TWO- LUMBAR THREE  . BIOPSY  03/16/2019   Procedure: BIOPSY;  Surgeon: Corbin Ade, MD;  [facility];  Service: Endoscopy;;  Gastric   . COLONOSCOPY WITH PROPOFOL N/A 03/16/2019   Dr. Jena Gauss: Normal terminal ileum, 15 cm.  Normal colon.  Next colonoscopy 10 years  . DILATION AND CURETTAGE OF UTERUS  2003  . ENTEROSCOPY N/A 04/02/2019  Dr. Jena Gaussourk: bulbar erosions, jejunal ulcerations, attempted biopsy but scope fell backwards and lesions cannot be reidentified.  No evidence of ampullary lesion or mass.  . ESOPHAGOGASTRODUODENOSCOPY (EGD) WITH PROPOFOL N/A 03/16/2019   Dr. Jena Gaussourk: Large hiatal hernia, Cameron/antral erosions, duodenal erosions.  Gastric biopsy showed reactive gastropathy, mild chronic gastritis, negative for H. pylori  . FEET SURGERY  1990   INSERTION OF BONE GRAFT IN FEET  . GIVENS CAPSULE STUDY N/A 03/16/2019   Procedure: GIVENS CAPSULE  STUDY;  Surgeon: Corbin Adeourk, Robert M, MD;  [facility];  Service: Endoscopy;  Laterality: N/A;  . LAPAROSCOPIC GASTRIC BANDING  10/17/10   Dr Gaynelle AduEric Wilson; [facility] Standard  . LAPAROSCOPIC REPAIR AND REMOVAL OF GASTRIC BAND  01/2018  . LIPOMA EXCISION  02/2006   abdomen    Family History  Problem Relation Age of Onset  . Heart disease Maternal Grandmother        V. Fib arrest  . Hypertension Mother   . Hyperlipidemia Mother   . Diabetes Mother        pre-diabetes  . Hypertension Father   . Hyperlipidemia Father   . Heart disease Father   . Diabetes Father   . Cancer Father        skin  . Diabetes Sister   . Hypertension Sister   . Cancer Maternal Grandfather        lung and skin  . Colon cancer Maternal Grandfather   . Cancer Paternal Grandfather        small bowel cancer and skin  . Pleurisy Son   . Migraines Son   . Colon polyps Other        aunt and uncle    Social History   Socioeconomic History  . Marital status: Married    Spouse name: Not on file  . Number of children: Not on file  . Years of education: Not on file  . Highest education level: Not on file  Occupational History  . Not on file  Tobacco Use  . Smoking status: Never Smoker  . Smokeless tobacco: Never Used  Vaping Use  . Vaping Use: Never used  Substance and Sexual Activity  . Alcohol use: No  . Drug use: No  . Sexual activity: Yes  Other Topics Concern  . Not on file  Social History Narrative  . Not on file   Social Determinants of Health   Financial Resource Strain:   . Difficulty of Paying Living Expenses:   Food Insecurity:   . Worried About Programme researcher, broadcasting/film/videounning Out of Food in the Last Year:   . Baristaan Out of Food in the Last Year:   Transportation Needs:   . Freight forwarderLack of Transportation (Medical):   Marland Kitchen. Lack of Transportation (Non-Medical):   Physical Activity:   . Days of Exercise per Week:   . Minutes of Exercise per Session:   Stress:   . Feeling of Stress :   Social Connections:   .  Frequency of Communication with Friends and Family:   . Frequency of Social Gatherings with Friends and Family:   . Attends Religious Services:   . Active Member of Clubs or Organizations:   . Attends BankerClub or Organization Meetings:   Marland Kitchen. Marital Status:   Intimate Partner Violence:   . Fear of Current or Ex-Partner:   . Emotionally Abused:   Marland Kitchen. Physically Abused:   . Sexually Abused:     ROS Review of Systems  Constitutional: Negative.   HENT: Negative.  Eyes: Negative.   Respiratory: Negative.   Cardiovascular: Negative.   Gastrointestinal: Negative.   Endocrine: Negative.   Genitourinary: Negative.   Musculoskeletal: Positive for back pain.  Allergic/Immunologic: Positive for food allergies.  Neurological: Negative.   Hematological: Negative.   Psychiatric/Behavioral: Negative.     Objective:   Today's Vitals: BP 137/83   Pulse 98   Resp 16   Ht 5' 5.5" (1.664 m)   Wt (!) 306 lb 1.9 oz (138.9 kg)   LMP 11/07/2012   SpO2 95%   BMI 50.17 kg/m   Physical Exam Constitutional:      Appearance: She is obese.  HENT:     Head: Normocephalic and atraumatic.     Nose: Nose normal.     Mouth/Throat:     Mouth: Mucous membranes are moist.  Eyes:     Extraocular Movements: Extraocular movements intact.     Conjunctiva/sclera: Conjunctivae normal.     Pupils: Pupils are equal, round, and reactive to light.  Cardiovascular:     Rate and Rhythm: Normal rate and regular rhythm.     Pulses: Normal pulses.     Heart sounds: Normal heart sounds.  Pulmonary:     Effort: Pulmonary effort is normal.     Breath sounds: Normal breath sounds.  Abdominal:     General: Bowel sounds are normal.     Palpations: Abdomen is soft.  Musculoskeletal:        General: Normal range of motion.     Cervical back: Normal range of motion and neck supple.  Neurological:     General: No focal deficit present.     Mental Status: She is alert and oriented to person, place, and time.    Psychiatric:        Mood and Affect: Mood normal.        Behavior: Behavior normal.        Thought Content: Thought content normal.        Judgment: Judgment normal.     Assessment & Plan:   Problem List Items Addressed This Visit      Other   Morbid obesity (HCC) (Chronic)   Fibromyalgia    Other Visit Diagnoses    Encounter to establish care    -  Primary   Depression, recurrent (HCC)       Mild intermittent asthma without complication       Chronic bilateral low back pain without sciatica       Migraine without aura and without status migrainosus, not intractable       Food allergy       Relevant Orders   Ambulatory referral to Allergy     Encounter to establish care: Care established -Check CBC, CMP, A1c, TSH, HIV and hepatitis C -Follow-up in 3 months  Depression with anxiety: Stable -PHQ-9: 12.  No active suicidal or homicidal thoughts -Continue Cymbalta and BuSpar and Ativan as needed  Fibromyalgia: Continue Cymbalta.  [facility]  Chronic back pain: Status post lumbar fusion surgery in July 2020. -Stable.  Continue gabapentin and Cymbalta  Mild intermittent asthma: Stable.  Patient has no wheezing noted on exam.  Oxygen saturation 95% on room air.  Continue albuterol as needed.  Migraine: Stable -Continue Versed as needed  Multiple food allergies: -Referred patient to allergy immunology for further evaluation.  Morbid obesity with BMI of 50 -Encourage dietary modification, exercise and weight loss     Outpatient Encounter Medications as of 05/13/2020  Medication  Sig  . albuterol (PROAIR HFA) 108 (90 BASE) MCG/ACT inhaler Inhale 2 puffs into the lungs every 6 (six) hours as needed for wheezing.  . busPIRone (BUSPAR) 10 MG tablet Take 10 mg by mouth 2 (two) times daily.   . butalbital-acetaminophen-caffeine (FIORICET) 50-325-40 MG tablet Take 1 tablet by mouth every 6 (six) hours as needed for headache.  . DULoxetine  (CYMBALTA) 60 MG capsule Take 1 capsule (60 mg total) by mouth 2 (two) times daily.  Marland Kitchen EPINEPHrine (EPIPEN) 0.3 mg/0.3 mL DEVI Inject 0.3 mLs (0.3 mg total) into the muscle once.  . gabapentin (NEURONTIN) 300 MG capsule Take 1 capsule (300 mg total) by mouth 2 (two) times daily. (Patient taking differently: Take 300 mg by mouth 3 (three) times daily. )  . loratadine (CLARITIN) 10 MG tablet Take 10 mg by mouth daily.  Marland Kitchen LORazepam (ATIVAN) 0.5 MG tablet Take 1 tablet (0.5 mg total) by mouth every 8 (eight) hours as needed for anxiety.  . [DISCONTINUED] buPROPion (WELLBUTRIN XL) 150 MG 24 hr tablet Take 1 bid (Plz sched appt with new provider) (Patient taking differently: Take 150 mg by mouth 2 times daily at 12 noon and 4 pm. )  . [DISCONTINUED] cyclobenzaprine (FLEXERIL) 10 MG tablet Take 1 tablet (10 mg total) by mouth 3 (three) times daily as needed for muscle spasms. (Patient not taking: Reported on 09/08/2019)   No facility-administered encounter medications on file as of 05/13/2020.    Follow-up: Return in about 3 months (around 08/13/2020).   Ollen Bowl, MD

## 2020-05-14 LAB — CMP14+EGFR
ALT: 36 IU/L — ABNORMAL HIGH (ref 0–32)
AST: 25 IU/L (ref 0–40)
Albumin/Globulin Ratio: 1.8 (ref 1.2–2.2)
Albumin: 4.4 g/dL (ref 3.8–4.8)
Alkaline Phosphatase: 119 IU/L (ref 48–121)
BUN/Creatinine Ratio: 9 (ref 9–23)
BUN: 7 mg/dL (ref 6–24)
Bilirubin Total: <0.2 mg/dL (ref 0.0–1.2)
CO2: 23 mmol/L (ref 20–29)
Calcium: 9.4 mg/dL (ref 8.7–10.2)
Chloride: 101 mmol/L (ref 96–106)
Creatinine, Ser: 0.8 mg/dL (ref 0.57–1.00)
GFR calc Af Amer: 103 mL/min/{1.73_m2} (ref >59)
GFR calc non Af Amer: 89 mL/min/{1.73_m2} (ref >59)
Globulin, Total: 2.4 g/dL (ref 1.5–4.5)
Glucose: 144 mg/dL — ABNORMAL HIGH (ref 65–99)
Potassium: 4.1 mmol/L (ref 3.5–5.2)
Sodium: 136 mmol/L (ref 134–144)
Total Protein: 6.8 g/dL (ref 6.0–8.5)

## 2020-05-14 LAB — CBC
Hematocrit: 40.7 % (ref 34.0–46.6)
Hemoglobin: 13.7 g/dL (ref 11.1–15.9)
MCH: 31.7 pg (ref 26.6–33.0)
MCHC: 33.7 g/dL (ref 31.5–35.7)
MCV: 94 fL (ref 79–97)
Platelets: 299 10*3/uL (ref 150–450)
RBC: 4.32 x10E6/uL (ref 3.77–5.28)
RDW: 13.3 % (ref 11.7–15.4)
WBC: 7.8 10*3/uL (ref 3.4–10.8)

## 2020-05-14 LAB — HEPATITIS C ANTIBODY: Hep C Virus Ab: <0.1 s/co ratio (ref 0.0–0.9)

## 2020-05-14 LAB — TSH: TSH: 1.75 u[IU]/mL (ref 0.450–4.500)

## 2020-05-14 LAB — HEMOGLOBIN A1C
Est. average glucose Bld gHb Est-mCnc: 117 mg/dL
Hgb A1c MFr Bld: 5.7 % — ABNORMAL HIGH (ref 4.8–5.6)

## 2020-05-14 LAB — HIV ANTIBODY (ROUTINE TESTING W REFLEX): HIV Screen 4th Generation wRfx: NONREACTIVE

## 2020-05-19 ENCOUNTER — Other Ambulatory Visit: Payer: Self-pay

## 2020-05-19 ENCOUNTER — Encounter (INDEPENDENT_AMBULATORY_CARE_PROVIDER_SITE_OTHER): Payer: Self-pay | Admitting: Family Medicine

## 2020-05-19 ENCOUNTER — Ambulatory Visit (INDEPENDENT_AMBULATORY_CARE_PROVIDER_SITE_OTHER): Payer: BC Managed Care – PPO | Admitting: Family Medicine

## 2020-05-19 VITALS — BP 123/79 | HR 77 | Temp 98.4°F | Ht 65.0 in | Wt 306.0 lb

## 2020-05-19 DIAGNOSIS — R7401 Elevation of levels of liver transaminase levels: Secondary | ICD-10-CM | POA: Diagnosis not present

## 2020-05-19 DIAGNOSIS — Z91018 Allergy to other foods: Secondary | ICD-10-CM

## 2020-05-19 DIAGNOSIS — Z9884 Bariatric surgery status: Secondary | ICD-10-CM

## 2020-05-19 DIAGNOSIS — F329 Major depressive disorder, single episode, unspecified: Secondary | ICD-10-CM | POA: Diagnosis not present

## 2020-05-19 DIAGNOSIS — Z6841 Body Mass Index (BMI) 40.0 and over, adult: Secondary | ICD-10-CM

## 2020-05-19 DIAGNOSIS — G4733 Obstructive sleep apnea (adult) (pediatric): Secondary | ICD-10-CM

## 2020-05-19 DIAGNOSIS — D508 Other iron deficiency anemias: Secondary | ICD-10-CM

## 2020-05-19 DIAGNOSIS — Z0289 Encounter for other administrative examinations: Secondary | ICD-10-CM

## 2020-05-19 DIAGNOSIS — R5383 Other fatigue: Secondary | ICD-10-CM

## 2020-05-19 DIAGNOSIS — E538 Deficiency of other specified B group vitamins: Secondary | ICD-10-CM

## 2020-05-19 DIAGNOSIS — Z9189 Other specified personal risk factors, not elsewhere classified: Secondary | ICD-10-CM

## 2020-05-19 DIAGNOSIS — R7303 Prediabetes: Secondary | ICD-10-CM

## 2020-05-19 DIAGNOSIS — F419 Anxiety disorder, unspecified: Secondary | ICD-10-CM | POA: Diagnosis not present

## 2020-05-19 DIAGNOSIS — E559 Vitamin D deficiency, unspecified: Secondary | ICD-10-CM

## 2020-05-19 DIAGNOSIS — G8929 Other chronic pain: Secondary | ICD-10-CM

## 2020-05-19 DIAGNOSIS — F32A Depression, unspecified: Secondary | ICD-10-CM

## 2020-05-19 DIAGNOSIS — Z9989 Dependence on other enabling machines and devices: Secondary | ICD-10-CM

## 2020-05-19 DIAGNOSIS — N951 Menopausal and female climacteric states: Secondary | ICD-10-CM

## 2020-05-19 DIAGNOSIS — R0602 Shortness of breath: Secondary | ICD-10-CM

## 2020-05-19 NOTE — Progress Notes (Addendum)
Dear Dr. Barron Alvine,   Thank you for referring [name] to our clinic. The following note includes my evaluation and treatment recommendations.  Chief Complaint:   OBESITY [name] (MR# [id number]) is a 44 y.o. female who presents for evaluation and treatment of obesity and related comorbidities. Current BMI is Body mass index is 50.92 kg/m. [name] has been struggling with her weight for many years and has been unsuccessful in either losing weight, maintaining weight loss, or reaching her healthy weight goal.  [name] is currently in the action stage of change and ready to dedicate time achieving and maintaining a healthier weight. [name] is interested in becoming our patient and working on intensive lifestyle modifications including (but not limited to) diet and exercise for weight loss.  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  She is married and lives with her husband and 2 children (16 and 5).  She also has a 87 year old, who is out of the house.  She has multiple food allergies.  She says she will be going to see an immunologist next week for testing.  She did well with TOPS and Weight Watchers in the past.  [name] provided the following food recall today:  Breakfast:  Cheerios, almond milk or Quaker oatmeal. Lunch:  Salad with feta, tomatoes, organic mix, bacon bits, cranberries, and creamy sauce or vinaigrette. Dinner:  With family at TV.  [name]'s habits were reviewed today and are as follows: Her family eats meals together, she thinks her family will eat healthier with her, she struggles with family and or coworkers weight loss sabotage, her desired weight loss is 119 pounds, she has been heavy most of her life, she started gaining weight when she was hurt and depressed, her heaviest weight ever was 312 pounds, she is sometimes a picky eater and doesn't like to eat healthier foods, she craves sweet tea, peanut butter, crisp grapes,  and BBQ flavored almonds, she is frequently drinking liquids with calories, she frequently makes poor food choices, she frequently eats larger portions than normal and she struggles with emotional eating.  Depression Screen [name]'s Food and Mood (modified PHQ-9) score was 19.  Depression screen [name] 2/9 05/19/2020  Decreased Interest 3  Down, Depressed, Hopeless 3  PHQ - 2 Score 6  Altered sleeping 2  Tired, decreased energy 3  Change in appetite 2  Feeling bad or failure about yourself  3  Trouble concentrating 1  Moving slowly or fidgety/restless 1  Suicidal thoughts 1  PHQ-9 Score 19  Difficult doing work/chores Very difficult   Subjective:   1. Other fatigue [name] admits to daytime somnolence and reports waking up still tired. Patent has a history of symptoms of daytime fatigue, morning fatigue, morning headache and snoring. [name] generally gets 6 or 7 hours of sleep per night, and states that she has poor quality sleep. Snoring is present. Apneic episodes are not present. Epworth Sleepiness Score is 6.  2. SOB (shortness of breath) on exertion [name] notes increasing shortness of breath with exercising and seems to be worsening over time with weight gain. She notes getting out of breath sooner with activity than she used to. This has gotten worse recently. [name] denies shortness of breath at rest or orthopnea.  3. Prediabetes [name] has a diagnosis of prediabetes based on her elevated HgA1c and was informed this puts her at greater risk of developing diabetes. She continues to work on diet and exercise to decrease  her risk of diabetes. She denies nausea or hypoglycemia.  Lab Results  Component Value Date   HGBA1C 5.7 (H) 05/13/2020   4. Elevated alanine aminotransferase (ALT) level  Lab Results  Component Value Date   ALT 36 (H) 05/13/2020   AST 25 05/13/2020   ALKPHOS 119 05/13/2020   BILITOT <0.2 05/13/2020   5. Other chronic pain [name] is taking Cymbalta  60 mg twice daily and gabapentin 300 mg three times daily for chronic pain.  6. Multiple food allergies [name] has allergies to whey, cashews, cauliflower, honey, oranges, mangoes, passion fruit, gluten, and lactose.  7. B12 deficiency She does not have a previous diagnosis of pernicious anemia.  She has a history of weight loss surgery.    Lab Results  Component Value Date   VITAMINB12 204 09/01/2019   8. Other iron deficiency anemia [name] is not a vegetarian.  She has a history of weight loss surgery.   CBC Latest Ref Rng & Units 05/13/2020 09/01/2019 05/26/2019  WBC 3.4 - 10.8 x10E3/uL 7.8 8.6 8.6  Hemoglobin 11.1 - 15.9 g/dL 84.1 32.4 40.1  Hematocrit 34.0 - 46.6 % 40.7 41.8 41.7  Platelets 150 - 450 x10E3/uL 299 326 304   Lab Results  Component Value Date   IRON 54 09/01/2019   TIBC 367 09/01/2019   FERRITIN 71 09/01/2019   Lab Results  Component Value Date   VITAMINB12 204 09/01/2019   9. Vitamin D deficiency She is currently taking no vitamin D supplement.  10. OSA on CPAP [name] has a diagnosis of sleep apnea. She reports that she is using a CPAP regularly.  She is positive for snoring and 1-2 morning headaches per week.  Epworth sleepiness score is 6.  11. Vasomotor symptoms  [name] is perimenopausal.  12. Personal history of gastric banding [name] had gastric banding in 09/2011 with Dr. Andrey Nguyen.  She is status post removal in 2019.  She had some complications during the last year (prior to removal).  She is taking a multivitamin and probiotics.  13. Anxiety and depression, with emotional eating Positive for emotional eating.  She eats when stressed, sad, bored, guilty, for comfort, and as a reward.  She is taking Buspar 10 mg twice daily, Cymbalta 60 mg twice daily, and lorazepam 0.5 mg as needed.  [name] is struggling with emotional eating and using food for comfort to the extent that it is negatively impacting her health. She has been working on behavior  modification techniques to help reduce her emotional eating and has been unsuccessful. She shows no sign of suicidal or homicidal ideations.  PHQ-9 is 19.  14. At risk for constipation Jadelyn is at increased risk for constipation due to inadequate water intake, changes in diet, and/or use of medications such as GLP1 agonists. Tishanna denies hard, infrequent stools currently.   Assessment/Plan:   1. Other fatigue Dorothee does feel that her weight is causing her energy to be lower than it should be. Fatigue may be related to obesity, depression or many other causes. Labs will be ordered, and in the meanwhile, Norita will focus on self care including making healthy food choices, increasing physical activity and focusing on stress reduction.  - EKG 12-Lead - CBC with Differential/Platelet  2. SOB (shortness of breath) on exertion Chelsae does feel that she gets out of breath more easily that she used to when she exercises. Keeya's shortness of breath appears to be obesity related and exercise induced. She has agreed to work on [employer]  loss and gradually increase exercise to treat her exercise induced shortness of breath. Will continue to monitor closely. - CBC with Differential/Platelet  3. Prediabetes Cathy will continue to work on weight loss, exercise, and decreasing simple carbohydrates to help decrease the risk of diabetes.   - Comprehensive metabolic panel  4. Elevated alanine aminotransferase (ALT) level We discussed the likely diagnosis of non-alcoholic fatty liver disease today and how this condition is obesity related. Juel was educated the importance of weight loss. Adiba agreed to continue with her weight loss efforts with healthier diet and exercise as an essential part of her treatment plan.  5. Other chronic pain Will follow because mobility and pain control are important for weight management.  6. Multiple food allergies We will continue to monitor.  7. B12  deficiency The diagnosis was reviewed with the patient. Counseling provided today, see below. We will continue to monitor. Orders and follow up as documented in patient record.  Counseling . The body needs vitamin B12: to make red blood cells; to make DNA; and to help the nerves work properly so they can carry messages from the brain to the body.  . The main causes of vitamin B12 deficiency include dietary deficiency, digestive diseases, pernicious anemia, and having a surgery in which part of the stomach or small intestine is removed.  . Certain medicines can make it harder for the body to absorb vitamin B12. These medicines include: heartburn medications; some antibiotics; some medications used to treat diabetes, gout, and high cholesterol.  . In some cases, there are no symptoms of this condition. If the condition leads to anemia or nerve damage, various symptoms can occur, such as weakness or fatigue, shortness of breath, and numbness or tingling in your hands and feet.   . Treatment:  o May include taking vitamin B12 supplements.  o Avoid alcohol.  o Eat lots of healthy foods that contain vitamin B12: - Beef, pork, chicken, [place], and organ meats, such as liver.  - Seafood: This includes clams, rainbow trout, salmon, tuna, and haddock. Eggs.  - Cereal and dairy products that are fortified: This means that vitamin B12 has been added to the food.   8. Other iron deficiency anemia Orders and follow up as documented in patient record.  Counseling . Iron is essential for our bodies to make red blood cells.  Reasons that someone may be deficient include: an iron-deficient diet (more likely in those following vegan or vegetarian diets), women with heavy menses, patients with GI disorders or poor absorption, patients that have had bariatric surgery, frequent blood donors, patients with cancer, and patients with heart disease.   [place] Cola foods include dark leafy greens, red and white meats,  eggs, seafood, and beans.   . Certain foods and drinks prevent your body from absorbing iron properly. Avoid eating these foods in the same meal as iron-rich foods or with iron supplements. These foods include: coffee, black tea, and red wine; milk, dairy products, and foods that are high in calcium; beans and soybeans; whole grains.  . Constipation can be a side effect of iron supplementation. Increased water and fiber intake are helpful. Water goal: > 2 liters/day. Fiber goal: > 25 grams/day.  - Anemia panel  9. Vitamin D deficiency Low Vitamin D level contributes to fatigue and are associated with obesity, breast, and colon cancer.   - VITAMIN D 25 Hydroxy (Vit-D Deficiency, Fractures)  10. OSA on CPAP Intensive lifestyle modifications are the first line treatment for  this issue. We discussed several lifestyle modifications today and she will continue to work on diet, exercise and weight loss efforts. We will continue to monitor. Orders and follow up as documented in patient record.   Counseling  Sleep apnea is a condition in which breathing pauses or becomes shallow during sleep. This happens over and over during the night. This disrupts your sleep and keeps your body from getting the rest that it needs, which can cause tiredness and lack of energy (fatigue) during the day.  Sleep apnea treatment: If you were given a device to open your airway while you sleep, USE IT!  Sleep hygiene:   Limit or avoid alcohol, caffeinated beverages, and cigarettes, especially close to bedtime.   Do not eat a large meal or eat spicy foods right before bedtime. This can lead to digestive discomfort that can make it hard for you to sleep.  Keep a sleep diary to help you and your health care provider figure out what could be causing your insomnia.  . Make your bedroom a dark, comfortable place where it is easy to fall asleep. ? Put up shades or blackout curtains to block light from outside. ? Use a white  noise machine to block noise. ? Keep the temperature cool. . Limit screen use before bedtime. This includes: ? Watching TV. ? Using your smartphone, tablet, or computer. . Stick to a routine that includes going to bed and waking up at the same times every day and night. This can help you fall asleep faster. Consider making a quiet activity, such as reading, part of your nighttime routine. . Try to avoid taking naps during the day so that you sleep better at night. . Get out of bed if you are still awake after 15 minutes of trying to sleep. Keep the lights down, but try reading or doing a quiet activity. When you feel sleepy, go back to bed.  11. Vasomotor symptoms due to menopause Current treatment plan is effective, no change in therapy. Orders and follow up as documented in patient record.  12. Personal history of gastric banding Will continue to monitor.  - Anemia panel - VITAMIN D 25 Hydroxy (Vit-D Deficiency, Fractures)  13. Anxiety and depression, with emotional eating Behavior modification techniques were discussed today to help Skii deal with her emotional/non-hunger eating behaviors.  Orders and follow up as documented in patient record.   14. At risk for constipation Assyria was given approximately 15 minutes of counseling today regarding prevention of constipation. She was encouraged to increase water and fiber intake.   15. Class 3 severe obesity with serious comorbidity and body mass index (BMI) of 50.0 to 59.9 in adult, unspecified obesity type (HCC) Shakeria is currently in the action stage of change and her goal is to continue with weight loss efforts. I recommend Takiyah begin the structured treatment plan as follows:  She has agreed to keeping a food journal and adhering to recommended goals of 1300 calories and >100 grams of protein.  Exercise goals: No exercise has been prescribed at this time.   Behavioral modification strategies: increasing lean protein intake,  decreasing simple carbohydrates, increasing vegetables, increasing water intake, decreasing sodium intake and increasing high fiber foods.  She was informed of the importance of frequent follow-up visits to maximize her success with intensive lifestyle modifications for her multiple health conditions. She was informed we would discuss her lab results at her next visit unless there is a critical issue that needs to be  addressed sooner. Pam SpragueBeverly agreed to keep her next visit at the agreed upon time to discuss these results.  Objective:   Blood pressure 123/79, pulse 77, temperature 98.4 F (36.9 C), temperature source Oral, height 5\' 5"  (1.651 m), weight (!) 306 lb (138.8 kg), last menstrual period 11/07/2012, SpO2 97 %. Body mass index is 50.92 kg/m.  EKG: Normal sinus rhythm, rate 71 bpm.  Indirect Calorimeter completed today shows a VO2 of 289 and a REE of 2015.  Her calculated basal metabolic rate is [id number] thus her basal metabolic rate is better than expected.  General: Cooperative, alert, well developed, in no acute distress. HEENT: Conjunctivae and lids unremarkable. Cardiovascular: Regular rhythm.  Lungs: Normal work of breathing. Neurologic: No focal deficits.   Lab Results  Component Value Date   CREATININE 0.80 05/13/2020   BUN 7 05/13/2020   NA 136 05/13/2020   K 4.1 05/13/2020   CL 101 05/13/2020   CO2 23 05/13/2020   Lab Results  Component Value Date   ALT 36 (H) 05/13/2020   AST 25 05/13/2020   ALKPHOS 119 05/13/2020   BILITOT <0.2 05/13/2020   Lab Results  Component Value Date   HGBA1C 5.7 (H) 05/13/2020   HGBA1C 5.4 02/25/2013   Lab Results  Component Value Date   TSH 1.750 05/13/2020   Lab Results  Component Value Date   CHOL 198 12/20/2008   HDL 37.4 (L) 12/20/2008   LDLCALC 138 (H) 12/20/2008   TRIG 111 12/20/2008   CHOLHDL 5.3 CALC 12/20/2008   Lab Results  Component Value Date   WBC 7.8 05/13/2020   HGB 13.7 05/13/2020   HCT 40.7 05/13/2020    MCV 94 05/13/2020   PLT 299 05/13/2020   Lab Results  Component Value Date   IRON 54 09/01/2019   TIBC 367 09/01/2019   FERRITIN 71 09/01/2019   Attestation Statements:   This is the patient's first visit at [facility]. The patient's NEW PATIENT PACKET was reviewed at length. Included in the packet: current and past health history, medications, allergies, ROS, gynecologic history (women only), surgical history, family history, social history, weight history, weight loss surgery history (for those that have had weight loss surgery), nutritional evaluation, mood and food questionnaire, PHQ9, Epworth questionnaire, sleep habits questionnaire, patient life and health improvement goals questionnaire. These will all be scanned into the patient's chart under media.   During the visit, I independently reviewed the patient's EKG, bioimpedance scale results, and indirect calorimeter results. I used this information to tailor a meal plan for the patient that will help her to lose weight and will improve her obesity-related conditions going forward. I performed a medically necessary appropriate examination and/or evaluation. I discussed the assessment and treatment plan with the patient. The patient was provided an opportunity to ask questions and all were answered. The patient agreed with the plan and demonstrated an understanding of the instructions. Labs were ordered at this visit and will be reviewed at the next visit unless more critical results need to be addressed immediately. Clinical information was updated and documented in the EMR.   I, Insurance claims handlerAmber Agner, CMA, am acting as transcriptionist for Helane RimaErica Shahan Starks, DO  I have reviewed the above documentation for accuracy and completeness, and I agree with the above. Helane Rima- Leray Garverick, DO

## 2020-05-20 LAB — ANEMIA PANEL
Ferritin: 130 ng/mL (ref 15–150)
Folate, Hemolysate: 339 ng/mL
Folate, RBC: 865 ng/mL (ref >498)
Hematocrit: 39.2 % (ref 34.0–46.6)
Iron Saturation: 32 % (ref 15–55)
Iron: 93 ug/dL (ref 27–159)
Retic Ct Pct: 3.1 % — ABNORMAL HIGH (ref 0.6–2.6)
Total Iron Binding Capacity: 287 ug/dL (ref 250–450)
UIBC: 194 ug/dL (ref 131–425)
Vitamin B-12: 331 pg/mL (ref 232–1245)

## 2020-05-20 LAB — CBC WITH DIFFERENTIAL/PLATELET
Basophils Absolute: 0.1 10*3/uL (ref 0.0–0.2)
Basos: 1 %
EOS (ABSOLUTE): 0.5 10*3/uL — ABNORMAL HIGH (ref 0.0–0.4)
Eos: 7 %
Hemoglobin: 12.8 g/dL (ref 11.1–15.9)
Immature Grans (Abs): 0.1 10*3/uL (ref 0.0–0.1)
Immature Granulocytes: 1 %
Lymphocytes Absolute: 1.7 10*3/uL (ref 0.7–3.1)
Lymphs: 24 %
MCH: 31.1 pg (ref 26.6–33.0)
MCHC: 32.7 g/dL (ref 31.5–35.7)
MCV: 95 fL (ref 79–97)
Monocytes Absolute: 0.5 10*3/uL (ref 0.1–0.9)
Monocytes: 7 %
Neutrophils Absolute: 4.3 10*3/uL (ref 1.4–7.0)
Neutrophils: 60 %
Platelets: 260 10*3/uL (ref 150–450)
RBC: 4.11 x10E6/uL (ref 3.77–5.28)
RDW: 13.3 % (ref 11.7–15.4)
WBC: 7.2 10*3/uL (ref 3.4–10.8)

## 2020-05-20 LAB — COMPREHENSIVE METABOLIC PANEL
ALT: 30 IU/L (ref 0–32)
AST: 27 IU/L (ref 0–40)
Albumin/Globulin Ratio: 1.5 (ref 1.2–2.2)
Albumin: 4.1 g/dL (ref 3.8–4.8)
Alkaline Phosphatase: 108 IU/L (ref 48–121)
BUN/Creatinine Ratio: 10 (ref 9–23)
BUN: 8 mg/dL (ref 6–24)
Bilirubin Total: 0.2 mg/dL (ref 0.0–1.2)
CO2: 27 mmol/L (ref 20–29)
Calcium: 9.1 mg/dL (ref 8.7–10.2)
Chloride: 102 mmol/L (ref 96–106)
Creatinine, Ser: 0.81 mg/dL (ref 0.57–1.00)
GFR calc Af Amer: 101 mL/min/{1.73_m2} (ref >59)
GFR calc non Af Amer: 88 mL/min/{1.73_m2} (ref >59)
Globulin, Total: 2.8 g/dL (ref 1.5–4.5)
Glucose: 100 mg/dL — ABNORMAL HIGH (ref 65–99)
Potassium: 4.4 mmol/L (ref 3.5–5.2)
Sodium: 140 mmol/L (ref 134–144)
Total Protein: 6.9 g/dL (ref 6.0–8.5)

## 2020-05-20 LAB — VITAMIN D 25 HYDROXY (VIT D DEFICIENCY, FRACTURES): Vit D, 25-Hydroxy: 18.3 ng/mL — ABNORMAL LOW (ref 30.0–100.0)

## 2020-05-23 NOTE — Progress Notes (Signed)
New Patient Note  RE: GOLDYE TOURANGEAU MRN: [id number] DOB: 1976-10-15 Date of Office Visit: 05/24/2020  Referring provider: Ollen Bowl, MD Primary care provider: Ollen Bowl, MD  Chief Complaint: Allergic Rhinitis  (Foods)  History of Present Illness: I had the pleasure of seeing Ludmila Ebarb for initial evaluation at the [facility] on 05/24/2020. She is a 44 y.o. female, who is referred here by Ollen Bowl, MD for the evaluation of food allergies.  Food: Currently avoiding whey protein, oranges, cashews, cauliflower, gluten, honey, mango, tropical fruits (okay with pineapple).  Whey protein, cashews, cauliflower cause throat tightness, difficulty breathing, facial itching. Symptom start within minutes even after miniscule exposure. Symptoms resolve after albuterol and benadryl use after a few hours. Tolerates casein.   Reactions started to happen in 2014.  Oranges, mangoes, tropical fruits cause migraines. Gluten causes GI issues (diarrhea, increase gas) - her son has celiac's disease.  She was evaluated by GI and had colonoscopy but results were unremarkable. Bloodwork was inconclusive.   Honey causes perioral pruritus, lip swelling and tingling.   She does have access to epinephrine autoinjector and not needed to use it.   Past work up includes: not recently.  Dietary History: patient has been eating other foods including only eats casein, eggs, peanut, treenuts (okay with almond, pecans, pistachios), sesame, shellfish, seafood, soy, meats, fruits and vegetables.  Assessment and Plan: Megham is a 44 y.o. female with: Adverse food reaction Patient currently avoiding multiple foods due to adverse food reactions.  Oranges, mangoes, topical foods cause migraines.  Gluten causes diarrhea and increase flatulence.  Whey protein, cashews, cauliflower cause throat tightness, difficulty breathing.  Honey causes perioral pruritus, lip swelling and  tingling.  All these reactions started happening in 2014.  No previous epinephrine use.  No recent allergy testing.  Patient tolerates casein, peanuts, almonds, pecans, pistachios, eggs, sesame, shellfish, seafood, soy, meats with no issues.  Today's skin testing showed: Negative to select foods but the positive control was borderline questioning the validity of these results.  Continue to avoid the foods that bother you - whey, citrus fruits, cashews, cauliflower, gluten, honey, mango, tropical fruits.   For mild symptoms you can take over the counter antihistamines such as Benadryl and monitor symptoms closely. If symptoms worsen or if you have severe symptoms including breathing issues, throat closure, significant swelling, whole body hives, severe diarrhea and vomiting, lightheadedness then inject epinephrine and seek immediate medical care afterwards.  Food action plan in place.  Get bloodwork. If boodwork looks favorable we may be able to some in office food challenges in the future.  Discussed with patient that the foods that cause migraines are not something that we should test for as it is a non-IgE mediated symptom.  Other allergic rhinitis Perennial rhinoconjunctivitis symptoms for 40+ years.  Uses Claritin with good benefit.  No recent allergy testing but had allergy injections at an ENT office for 3 years with good benefit.  Declines environmental allergy testing today.  Start environmental control measures as below - pets and dust mites  May use over the counter antihistamines such as Zyrtec (cetirizine), Claritin (loratadine), Allegra (fexofenadine), or Xyzal (levocetirizine) daily as needed.  If symptoms not controlled with above regimen then recommend retesting to environmental allergies in the future.  Return in about 6 months (around 11/24/2020).  Meds ordered this encounter  Medications  . EPINEPHrine (AUVI-Q) 0.3 mg/0.3 mL IJ SOAJ injection    Sig: Inject  0.3 mLs (0.3  mg total) into the muscle once for 1 dose. As directed for life-threatening allergic reactions    Dispense:  2 each    Refill:  2    Lab Orders     Allergen, Cauliflower, [id number]     Honey IgE     IgE Nut Prof. w/Component Rflx     IgE Milk w/ Component Reflex     Wheat IgE     Allergen Gluten f79     Allergen, Whey, f236  Other allergy screening: Asthma: yes  She reports symptoms of chest tightness, shortness of breath, coughing, wheezing for 20 years. Current medications include albuterol prn which help. She reports not using aerochamber with inhalers. She tried the following inhalers: none. Main triggers are infections, exercise, strong scents. In the last month, frequency of symptoms: depends on situations. Frequency of nocturnal symptoms: 0x/month. Frequency of SABA use: <1x/week. Interference with physical activity: yes. Sleep is undisturbed. In the last 12 months, emergency room visits/urgent care visits/doctor office visits or hospitalizations due to respiratory issues: 0. In the last 12 months, oral steroids courses: 1. Lifetime history of hospitalization for respiratory issues: 0. Prior intubations: 0. History of pneumonia: no. She was evaluated by allergist in the past. Smoking exposure: denies. Up to date with flu vaccine: yes. Up to date with COVID-19 vaccine: yes.  History of reflux: yes.  Rhino conjunctivitis: yes  She reports symptoms of sneezing, watery eyes, rhinorrhea. Symptoms have been going on for 40+ years. The symptoms are present all year around. Other triggers include exposure to pets and dust mites. Headache: yes. She has used Claritin with fair improvement in symptoms. Previous work up includes: not recently. Had allergy injections via ENT for 3 years with good benefit.  Medication allergy: yes Hymenoptera allergy: large localized reaction. Urticaria: no Eczema:no History of recurrent infections suggestive of immunodeficency: no  Diagnostics: Spirometry:    Tracings reviewed. Her effort: Good reproducible efforts. FVC: 3.40L FEV1: 2.75L, 88% predicted FEV1/FVC ratio: 81% Interpretation: Spirometry consistent with normal pattern.  Please see scanned spirometry results for details.  Skin Testing: Select foods. Negative to select foods but the positive control was borderline as well.  Results discussed with patient/family.  Food Adult Perc - 05/24/20 1000    Time Antigen Placed 1030    Allergen Manufacturer Greer    Location Arm    Number of allergen test 16     Control-buffer 50% Glycerol Negative    Control-Histamine 1 mg/ml --   +/-   3. Wheat Negative    5. Milk, cow Negative    7. Casein Negative    10. Cashew Negative    11. Pecan Food Negative    12. Walnut Food Negative    13. Almond Negative    14. Hazelnut Negative    15. [place] nut Negative    16. Coconut Negative    17. Pistachio Negative    26. Crab Negative    56. Orange  Negative    62. Watermelon Negative           Past Medical History: Patient Active Problem List   Diagnosis Date Noted  . Adverse food reaction 05/24/2020  . Mild intermittent asthma without complication 05/24/2020  . Other allergic rhinitis 05/24/2020  . Snoring 07/09/2019  . Degenerative scoliosis in adult patient 05/11/2019  . Anxiety and depression 04/08/2019  . Fibromyalgia 04/08/2019  . Primary osteoarthritis of both knees 09/17/2018  . Primary osteoarthritis of both hands 09/17/2018  .  DDD (degenerative disc disease), lumbar 08/27/2018  . Family history of rheumatoid arthritis 08/27/2018  . Flat foot 08/27/2018  . Multiple food allergies 07/28/2018  . Family history of celiac disease 07/28/2018  . Iron deficiency anemia 02/07/2018  . History of removal of laparoscopic gastric banding device 02/03/2018  . Anemia 01/13/2018  . H/O laparoscopic adjustable gastric banding 07/29/2013  . Bilateral ovarian cysts 11/13/2012  . Major depression, recurrent, chronic (HCC) 01/22/2011   . Decreased sex drive 40/98/1191  . Morbid obesity (HCC) 02/13/2010  . OTHER MALAISE AND FATIGUE 12/20/2008   Past Medical History:  Diagnosis Date  . ADD (attention deficit disorder)   . Allergies   . Anemia   . Arthritis   . Asthma    EXERCISE INDUCED  . B12 deficiency   . Bursitis of hip   . Chronic back pain   . Depression   . Family history of adverse reaction to anesthesia    patient states father had a reaction to anesthesia about 20 years ago where his face and throat swelled, had to be reintubated."  . Fibromyalgia   . Generalized headaches   . GERD (gastroesophageal reflux disease)   . Irregular menses   . Joint pain   . Lactose intolerance   . Lower extremity edema   . Migraines   . Muscle pain   . Obesity (BMI 30-39.9)   . Osteoarthritis   . Sleep apnea   . SOB (shortness of breath)   . Stomach ulcer   . Swallowing difficulty   . Vision problems   . Vitamin D deficiency    Past Surgical History: Past Surgical History:  Procedure Laterality Date  . ABDOMINAL HYSTERECTOMY    . ACHILLES TENDON LENGTHENING  1989  . ANKLE GANGLION CYST EXCISION  02/2012   left ankle  . ANTERIOR LAT LUMBAR FUSION Right 05/11/2019   Procedure: ANTERIOR LATERAL LUMBAR INTERBODY FUSION, LATERAL INSTRUMENTATION, RIGHT LUMBAR TWO- LUMBAR THREE;  Surgeon: Tressie Stalker, MD;  [facility];  Service: Neurosurgery;  Laterality: Right;  ANTERIOR LATERAL LUMBAR INTERBODY FUSION, LATERAL INSTRUMENTATION, RIGHT LUMBAR TWO- LUMBAR THREE  . BIOPSY  03/16/2019   Procedure: BIOPSY;  Surgeon: Corbin Ade, MD;  Location: [facility];  Service: Endoscopy;;  Gastric   . COLONOSCOPY WITH PROPOFOL N/A 03/16/2019   Dr. Jena Gauss: Normal terminal ileum, 15 cm.  Normal colon.  Next colonoscopy 10 years  . DILATION AND CURETTAGE OF UTERUS  2003  . ENTEROSCOPY N/A 04/02/2019   Dr. Jena Gauss: bulbar erosions, jejunal ulcerations, attempted biopsy but scope fell backwards and lesions cannot be  reidentified.  No evidence of ampullary lesion or mass.  . ESOPHAGOGASTRODUODENOSCOPY (EGD) WITH PROPOFOL N/A 03/16/2019   Dr. Jena Gauss: Large hiatal hernia, Cameron/antral erosions, duodenal erosions.  Gastric biopsy showed reactive gastropathy, mild chronic gastritis, negative for H. pylori  . FEET SURGERY  1990   INSERTION OF BONE GRAFT IN FEET  . GIVENS CAPSULE STUDY N/A 03/16/2019   Procedure: GIVENS CAPSULE STUDY;  Surgeon: Corbin Ade, MD;  Location: [facility];  Service: Endoscopy;  Laterality: N/A;  . LAPAROSCOPIC GASTRIC BANDING  10/17/10   Dr Gaynelle Adu; [facility]  . LAPAROSCOPIC REPAIR AND REMOVAL OF GASTRIC BAND  01/2018  . LIPOMA EXCISION  02/2006   abdomen   Medication List:  Current Outpatient Medications  Medication Sig Dispense Refill  . albuterol (PROAIR HFA) 108 (90 BASE) MCG/ACT inhaler Inhale 2 puffs into the lungs every 6 (six) hours as needed  for wheezing.    . busPIRone (BUSPAR) 10 MG tablet Take 10 mg by mouth 2 (two) times daily.     . butalbital-acetaminophen-caffeine (FIORICET) 50-325-40 MG tablet Take 1 tablet by mouth every 6 (six) hours as needed for headache. 30 tablet 0  . DULoxetine (CYMBALTA) 60 MG capsule Take 1 capsule (60 mg total) by mouth 2 (two) times daily. 180 capsule 3  . EPINEPHrine (EPIPEN) 0.3 mg/0.3 mL DEVI Inject 0.3 mLs (0.3 mg total) into the muscle once. 1 Device 0  . gabapentin (NEURONTIN) 300 MG capsule Take 1 capsule (300 mg total) by mouth 2 (two) times daily. (Patient taking differently: Take 300 mg by mouth 3 (three) times daily. ) 60 capsule 11  . loratadine (CLARITIN) 10 MG tablet Take 10 mg by mouth daily.    Marland Kitchen LORazepam (ATIVAN) 0.5 MG tablet Take 1 tablet (0.5 mg total) by mouth every 8 (eight) hours as needed for anxiety. 30 tablet 1  . EPINEPHrine (AUVI-Q) 0.3 mg/0.3 mL IJ SOAJ injection Inject 0.3 mLs (0.3 mg total) into the muscle once for 1 dose. As directed for life-threatening allergic reactions 2 each 2   No current  facility-administered medications for this visit.   Allergies: Allergies  Allergen Reactions  . Cashew Nut Oil Anaphylaxis  . Cauliflower [Brassica Oleracea] Anaphylaxis  . Whey Anaphylaxis  . Augmentin [Amoxicillin-Pot Clavulanate] Diarrhea and Nausea And Vomiting    Has patient had a PCN reaction causing immediate rash, facial/tongue/throat swelling, SOB or lightheadedness with hypotension: No Has patient had a PCN reaction causing severe rash involving mucus membranes or skin necrosis: No Has patient had a PCN reaction that required hospitalization: No Has patient had a PCN reaction occurring within the last 10 years: Unknown If all of the above answers are "NO", then may proceed with Cephalosporin use.   Erskine Emery Fruit [Citrus] Other (See Comments)    Migraines   . Gluten Meal Other (See Comments)    Severe GI upset  . Milk-Related Compounds   . Other     epidural steroid injection - severe headaches, high fever  . Codeine Rash  . Tree Extract Itching    Tee tree oil   Social History: Social History   Socioeconomic History  . Marital status: Married    Spouse name: Not on file  . Number of children: Not on file  . Years of education: Not on file  . Highest education level: Not on file  Occupational History  . Occupation: Clinical biochemist  Tobacco Use  . Smoking status: Never Smoker  . Smokeless tobacco: Never Used  Vaping Use  . Vaping Use: Never used  Substance and Sexual Activity  . Alcohol use: No  . Drug use: No  . Sexual activity: Yes  Other Topics Concern  . Not on file  Social History Narrative  . Not on file   Social Determinants of Health   Financial Resource Strain:   . Difficulty of Paying Living Expenses:   Food Insecurity:   . Worried About Programme researcher, broadcasting/film/video in the Last Year:   . Barista in the Last Year:   Transportation Needs:   . Freight forwarder (Medical):   Marland Kitchen Lack of Transportation (Non-Medical):   Physical Activity:     . Days of Exercise per Week:   . Minutes of Exercise per Session:   Stress:   . Feeling of Stress :   Social Connections:   . Frequency of Communication with  Friends and Family:   . Frequency of Social Gatherings with Friends and Family:   . Attends Religious Services:   . Active Member of Clubs or Organizations:   . Attends Banker Meetings:   Marland Kitchen Marital Status:    Lives in a house built in 2003. Smoking: denies Occupation: Research officer, political party HistorySurveyor, minerals in the house: no Engineer, civil (consulting) in the family room: yes Carpet in the bedroom: yes Heating: gas Cooling: heat pump Pet: yes 1 cat  Family History: Family History  Problem Relation Age of Onset  . Heart disease Maternal Grandmother        V. Fib arrest  . Hypertension Mother   . Hyperlipidemia Mother   . Diabetes Mother        pre-diabetes  . Allergic rhinitis Mother   . Asthma Mother   . Urticaria Mother   . Hypertension Father   . Hyperlipidemia Father   . Heart disease Father   . Diabetes Father   . Cancer Father        skin  . Allergic rhinitis Father   . Asthma Father   . Urticaria Father   . Diabetes Sister   . Hypertension Sister   . Allergic rhinitis Sister   . Asthma Sister   . Urticaria Sister   . Cancer Maternal Grandfather        lung and skin  . Colon cancer Maternal Grandfather   . Cancer Paternal Grandfather        small bowel cancer and skin  . Pleurisy Son   . Migraines Son   . Colon polyps Other        aunt and uncle  . Eczema Neg Hx    Review of Systems  Constitutional: Negative for appetite change, chills, fever and unexpected weight change.  HENT: Negative for congestion and rhinorrhea.   Eyes: Negative for itching.  Respiratory: Negative for cough, chest tightness, shortness of breath and wheezing.   Cardiovascular: Negative for chest pain.  Gastrointestinal: Negative for abdominal pain.  Genitourinary: Negative for difficulty urinating.   Skin: Negative for rash.  Neurological: Positive for headaches.   Objective: BP (!) 128/96 (BP Location: Right Arm, Patient Position: Sitting, Cuff Size: Large)   Pulse 72   Temp 97.9 F (36.6 C) (Temporal)   Resp 16   Ht 5' 5.71" (1.669 m)   Wt (!) 308 lb 8 oz (139.9 kg)   LMP 11/07/2012   SpO2 98%   BMI 50.24 kg/m  Body mass index is 50.24 kg/m. Physical Exam Vitals and nursing note reviewed.  Constitutional:      Appearance: Normal appearance. She is well-developed.  HENT:     Head: Normocephalic and atraumatic.     Right Ear: External ear normal.     Left Ear: External ear normal.     Nose: Nose normal.     Mouth/Throat:     Mouth: Mucous membranes are moist.     Pharynx: Oropharynx is clear.  Eyes:     Conjunctiva/sclera: Conjunctivae normal.  Cardiovascular:     Rate and Rhythm: Normal rate and regular rhythm.     Heart sounds: Normal heart sounds. No murmur heard.  No friction rub. No gallop.   Pulmonary:     Effort: Pulmonary effort is normal.     Breath sounds: Normal breath sounds. No wheezing, rhonchi or rales.  Abdominal:     Palpations: Abdomen is soft.  Musculoskeletal:     Cervical back: Neck  supple.  Skin:    General: Skin is warm.     Findings: No rash.  Neurological:     Mental Status: She is alert and oriented to person, place, and time.  Psychiatric:        Behavior: Behavior normal.    The plan was reviewed with the patient/family, and all questions/concerned were addressed.  It was my pleasure to see Meriam SpragueBeverly today and participate in her care. Please feel free to contact me with any questions or concerns.  Sincerely,  Wyline MoodYoon Kordel Leavy, DO Allergy & Immunology  [facility] [place] office: [phone] [facility] office: [phone] [place] office: [phone]

## 2020-05-24 ENCOUNTER — Ambulatory Visit: Payer: BC Managed Care – PPO | Admitting: Allergy

## 2020-05-24 ENCOUNTER — Other Ambulatory Visit: Payer: Self-pay

## 2020-05-24 ENCOUNTER — Encounter: Payer: Self-pay | Admitting: Allergy

## 2020-05-24 VITALS — BP 128/96 | HR 72 | Temp 97.9°F | Resp 16 | Ht 65.71 in | Wt 308.5 lb

## 2020-05-24 DIAGNOSIS — J3089 Other allergic rhinitis: Secondary | ICD-10-CM

## 2020-05-24 DIAGNOSIS — T781XXD Other adverse food reactions, not elsewhere classified, subsequent encounter: Secondary | ICD-10-CM

## 2020-05-24 DIAGNOSIS — J4520 Mild intermittent asthma, uncomplicated: Secondary | ICD-10-CM | POA: Insufficient documentation

## 2020-05-24 DIAGNOSIS — T781XXA Other adverse food reactions, not elsewhere classified, initial encounter: Secondary | ICD-10-CM | POA: Insufficient documentation

## 2020-05-24 HISTORY — DX: Other allergic rhinitis: J30.89

## 2020-05-24 MED ORDER — EPINEPHRINE 0.3 MG/0.3ML IJ SOAJ: 0.3000 mg | Freq: Once | INTRAMUSCULAR | 2 refills | Status: AC

## 2020-05-24 NOTE — Patient Instructions (Addendum)
Today's skin testing showed: Negative to select foods but the positive control was borderline as well.   Food:  Continue to avoid the foods that bother you - whey, citrus fruits, cashews, cauliflower, gluten, honey, mango, tropical fruits.   For mild symptoms you can take over the counter antihistamines such as Benadryl and monitor symptoms closely. If symptoms worsen or if you have severe symptoms including breathing issues, throat closure, significant swelling, whole body hives, severe diarrhea and vomiting, lightheadedness then inject epinephrine and seek immediate medical care afterwards.  Food action plan in place.  Get bloodwork:   If boodwork looks good we may be able to some in office food challenges in the future.  We are ordering labs, so please allow 1-2 weeks for the results to come back. With the newly implemented Cures Act, the labs might be visible to you at the same time that they become visible to me. However, I will not address the results until all of the results are back, so please be patient.  In the meantime, continue recommendations in your patient instructions, including avoidance measures (if applicable), until you hear from me.  Environmental allergies  Start environmental control measures as below.  May use over the counter antihistamines such as Zyrtec (cetirizine), Claritin (loratadine), Allegra (fexofenadine), or Xyzal (levocetirizine) daily as needed.  Breathing:  Today's breathing test was normal.  May use albuterol rescue inhaler 2 puffs every 4 to 6 hours as needed for shortness of breath, chest tightness, coughing, and wheezing. May use albuterol rescue inhaler 2 puffs 5 to 15 minutes prior to strenuous physical activities. Monitor frequency of use.   Follow up in 6 months or sooner if needed with Dr. Dellis Anes in the [place] office.   Pet Allergen Avoidance:  Contrary to popular opinion, there are no hypoallergenic breeds of dogs or cats. That  is because people are not allergic to an animals hair, but to an allergen found in the animal's saliva, dander (dead skin flakes) or urine. Pet allergy symptoms typically occur within minutes. For some people, symptoms can build up and become most severe 8 to 12 hours after contact with the animal. People with severe allergies can experience reactions in public places if dander has been transported on the pet owners clothing.  Keeping an animal outdoors is only a partial solution, since homes with pets in the yard still have higher concentrations of animal allergens.  Before getting a pet, ask your allergist to determine if you are allergic to animals. If your pet is already considered part of your family, try to minimize contact and keep the pet out of the bedroom and other rooms where you spend a great deal of time.  As with dust mites, vacuum carpets often or replace carpet with a hardwood floor, tile or linoleum.  High-efficiency particulate air (HEPA) cleaners can reduce allergen levels over time.  While dander and saliva are the source of cat and dog allergens, urine is the source of allergens from rabbits, hamsters, mice and [place] pigs; so ask a non-allergic family member to clean the animals cage.  If you have a pet allergy, talk to your allergist about the potential for allergy immunotherapy (allergy shots). This strategy can often provide long-term relief. Control of House Dust Mite Allergen  Dust mite allergens are a common trigger of allergy and asthma symptoms. While they can be found throughout the house, these microscopic creatures thrive in warm, humid environments such as bedding, upholstered furniture and carpeting.  Because  so much time is spent in the bedroom, it is essential to reduce mite levels there.   Encase pillows, mattresses, and box springs in special allergen-proof fabric covers or airtight, zippered plastic covers.   Bedding should be washed weekly in hot water  (130 F) and dried in a hot dryer. Allergen-proof covers are available for comforters and pillows that cant be regularly washed.   Wash the allergy-proof covers every few months. Minimize clutter in the bedroom. Keep pets out of the bedroom.   Keep humidity less than 50% by using a dehumidifier or air conditioning. You can buy a humidity measuring device called a hygrometer to monitor this.   If possible, replace carpets with hardwood, linoleum, or washable area rugs. If that's not possible, vacuum frequently with a vacuum that has a HEPA filter.  Remove all upholstered furniture and non-washable window drapes from the bedroom.  Remove all non-washable stuffed toys from the bedroom.  Wash stuffed toys weekly.

## 2020-05-24 NOTE — Assessment & Plan Note (Signed)
Patient currently avoiding multiple foods due to adverse food reactions.  Oranges, mangoes, topical foods cause migraines.  Gluten causes diarrhea and increase flatulence.  Whey protein, cashews, cauliflower cause throat tightness, difficulty breathing.  Honey causes perioral pruritus, lip swelling and tingling.  All these reactions started happening in 2014.  No previous epinephrine use.  No recent allergy testing.  Patient tolerates casein, peanuts, almonds, pecans, pistachios, eggs, sesame, shellfish, seafood, soy, meats with no issues.  Today's skin testing showed: Negative to select foods but the positive control was borderline questioning the validity of these results.  Continue to avoid the foods that bother you - whey, citrus fruits, cashews, cauliflower, gluten, honey, mango, tropical fruits.   For mild symptoms you can take over the counter antihistamines such as Benadryl and monitor symptoms closely. If symptoms worsen or if you have severe symptoms including breathing issues, throat closure, significant swelling, whole body hives, severe diarrhea and vomiting, lightheadedness then inject epinephrine and seek immediate medical care afterwards.  Food action plan in place.  Get bloodwork. If boodwork looks favorable we may be able to some in office food challenges in the future.  Discussed with patient that the foods that cause migraines are not something that we should test for as it is a non-IgE mediated symptom.

## 2020-05-24 NOTE — Assessment & Plan Note (Signed)
Perennial rhinoconjunctivitis symptoms for 40+ years.  Uses Claritin with good benefit.  No recent allergy testing but had allergy injections at an ENT office for 3 years with good benefit.  Declines environmental allergy testing today.  Start environmental control measures as below - pets and dust mites  May use over the counter antihistamines such as Zyrtec (cetirizine), Claritin (loratadine), Allegra (fexofenadine), or Xyzal (levocetirizine) daily as needed.  If symptoms not controlled with above regimen then recommend retesting to environmental allergies in the future.

## 2020-05-27 ENCOUNTER — Ambulatory Visit: Payer: BC Managed Care – PPO | Admitting: Internal Medicine

## 2020-06-01 LAB — ALLERGEN, WHEAT, F4: Wheat IgE: 0.23 kU/L — AB

## 2020-06-01 LAB — ALLERGEN, CAULIFLOWER, RF291: Allergen Cauliflower IgE: 0.18 kU/L — AB

## 2020-06-01 LAB — IGE NUT PROF. W/COMPONENT RFLX
F017-IgE Hazelnut (Filbert): <0.1 kU/L
F018-IgE Brazil Nut: <0.1 kU/L
F020-IgE Almond: <0.1 kU/L
F202-IgE Cashew Nut: <0.1 kU/L
F203-IgE Pistachio Nut: <0.1 kU/L
F256-IgE Walnut: <0.1 kU/L
Macadamia Nut, IgE: <0.1 kU/L
Peanut, IgE: 0.16 kU/L — AB
Pecan Nut IgE: <0.1 kU/L

## 2020-06-01 LAB — PEANUT COMPONENTS
F352-IgE Ara h 8: <0.1 kU/L
F422-IgE Ara h 1: <0.1 kU/L
F423-IgE Ara h 2: <0.1 kU/L
F424-IgE Ara h 3: <0.1 kU/L
F427-IgE Ara h 9: <0.1 kU/L
F447-IgE Ara h 6: <0.1 kU/L

## 2020-06-01 LAB — F247-IGE HONEY: F247-IgE Honey: <0.1 kU/L

## 2020-06-01 LAB — ALLERGEN GLUTEN F79: Allergen Gluten IgE: 0.25 kU/L — AB

## 2020-06-01 LAB — ALLERGEN COMPONENT COMMENTS

## 2020-06-01 LAB — ALLERGEN, WHEY, F236: Whey: <0.1 kU/L

## 2020-06-01 LAB — IGE MILK W/ COMPONENT REFLEX: F002-IgE Milk: <0.1 kU/L

## 2020-06-02 ENCOUNTER — Ambulatory Visit (INDEPENDENT_AMBULATORY_CARE_PROVIDER_SITE_OTHER): Payer: BC Managed Care – PPO | Admitting: Family Medicine

## 2020-06-02 ENCOUNTER — Other Ambulatory Visit: Payer: Self-pay

## 2020-06-02 ENCOUNTER — Encounter (INDEPENDENT_AMBULATORY_CARE_PROVIDER_SITE_OTHER): Payer: Self-pay | Admitting: Family Medicine

## 2020-06-02 VITALS — BP 136/94 | HR 76 | Temp 98.0°F | Ht 65.0 in | Wt 300.0 lb

## 2020-06-02 DIAGNOSIS — R7303 Prediabetes: Secondary | ICD-10-CM | POA: Diagnosis not present

## 2020-06-02 DIAGNOSIS — Z91018 Allergy to other foods: Secondary | ICD-10-CM | POA: Diagnosis not present

## 2020-06-02 DIAGNOSIS — E538 Deficiency of other specified B group vitamins: Secondary | ICD-10-CM

## 2020-06-02 DIAGNOSIS — Z6841 Body Mass Index (BMI) 40.0 and over, adult: Secondary | ICD-10-CM

## 2020-06-02 DIAGNOSIS — Z9189 Other specified personal risk factors, not elsewhere classified: Secondary | ICD-10-CM | POA: Diagnosis not present

## 2020-06-02 DIAGNOSIS — Z9884 Bariatric surgery status: Secondary | ICD-10-CM

## 2020-06-02 DIAGNOSIS — E559 Vitamin D deficiency, unspecified: Secondary | ICD-10-CM | POA: Diagnosis not present

## 2020-06-02 MED ORDER — VITAMIN D (ERGOCALCIFEROL) 1.25 MG (50000 UNIT) PO CAPS: 50000.0000 [IU] | ORAL_CAPSULE | ORAL | 0 refills | Status: DC

## 2020-06-02 NOTE — Progress Notes (Signed)
Chief Complaint:   OBESITY [name] is here to discuss her progress with her obesity treatment plan along with follow-up of her obesity related diagnoses. [name] is on keeping a food journal and adhering to recommended goals of 1500 calories and 100+ grams of protein and states she is following her eating plan approximately 80-90% of the time. [name] states she is increasing the amount of walking she is doing for exercise.  Today's visit was #: 2 Starting weight: 306 lbs Starting date: 05/19/2020 Today's weight: 300 lbs Today's date: 06/02/2020 Total lbs lost to date: 6 lbs Total lbs lost since last in-office visit: 6 lbs  Interim History: [name] says she struggled with logging her food into [employer], but says that she adheres to the plan better when she logs.  She is going to the beach today for her upcoming birthday.  [name] provided the following food recall today:  Breakfast:  On plan. Lunch:  Salad (added chickpea pasta to add protein) [employer] caesar dressing. Dinner:  On plan generally.  She has increased her water intake significantly.  Subjective:   1. Vitamin D deficiency [name]'s Vitamin D level was 18.3 on 05/19/2020. She is currently taking prescription vitamin D 50,000 IU each week.   2. Prediabetes  Lab Results  Component Value Date   HGBA1C 5.7 (H) 05/13/2020   3. B12 deficiency She has a history of weight loss surgery.   Lab Results  Component Value Date   VITAMINB12 331 05/19/2020   4. Multiple food allergies [name] is allergic to wheat, gluten, cauliflower.  5. Personal history of gastric banding [name] had gastric banding in 09/2011 with Dr. Andrey Campanile.  She is status post removal in 2019.  She had some complications during the last year (prior to removal).  She is taking a multivitamin and probiotics.  Assessment/Plan:   1. Vitamin D deficiency Discussed labs with patient today.  Not at goal. Goal > 50. Low Vitamin D level contributes to fatigue  and are associated with obesity, breast, and colon cancer. She agrees to continue to take prescription Vitamin D @50 ,000 IU every week and will follow-up for routine testing of Vitamin D, at least 2-3 times per year to avoid over-replacement.  Orders - Vitamin D, Ergocalciferol, (DRISDOL) 1.25 MG (50000 UNIT) CAPS capsule; Take 1 capsule (50,000 Units total) by mouth every 7 (seven) days.  Dispense: 4 capsule; Refill: 0  2. Prediabetes Discussed labs with patient today.  Not optimized. Goal is HgbA1c < 5.7 and insulin level closer to 5. [name] will continue to work on weight loss, exercise, and decreasing simple carbohydrates to help decrease the risk of diabetes.   3. B12 deficiency Discussed labs with patient today.  Recommend vitamin B12 1000 mcg SL daily.  Counseling . The body needs vitamin B12: to make red blood cells; to make DNA; and to help the nerves work properly so they can carry messages from the brain to the body.  . The main causes of vitamin B12 deficiency include dietary deficiency, digestive diseases, pernicious anemia, and having a surgery in which part of the stomach or small intestine is removed.  . Certain medicines can make it harder for the body to absorb vitamin B12. These medicines include: heartburn medications; some antibiotics; some medications used to treat diabetes, gout, and high cholesterol.  . In some cases, there are no symptoms of this condition. If the condition leads to anemia or nerve damage, various symptoms can occur, such as weakness or fatigue, shortness  of breath, and numbness or tingling in your hands and feet.   . Treatment:  o May include taking vitamin B12 supplements.  o Avoid alcohol.  o Eat lots of healthy foods that contain vitamin B12: - Beef, pork, chicken, [place], and organ meats, such as liver.  - Seafood: This includes clams, rainbow trout, salmon, tuna, and haddock. Eggs.  - Cereal and dairy products that are fortified: This means that  vitamin B12 has been added to the food.   4. Multiple food allergies Discussed labs with patient today.  Will continue to monitor.  5. Personal history of gastric banding [name] is at risk for malnutrition due to her previous bariatric surgery.   Counseling  You may need to eat 3 meals and 2 snacks, or 5 small meals each day in order to reach your protein and calorie goals.   Allow at least 15 minutes for each meal so that you can eat mindfully. Listen to your body so that you do not overeat. For most people, your sleeve or pouch will comfortably hold 4-6 ounces.  Eat foods from all food groups. This includes fruits and vegetables, grains, dairy, and meat and other proteins.  Include a protein-rich food at every meal and snack, and eat the protein food first.   You should be taking a Bariatric Multivitamin as well as calcium.   6. At risk for heart disease Man was given approximately 15 minutes of coronary artery disease prevention counseling today. She is 44 y.o. female and has risk factors for heart disease including obesity and metabolic syndrome. We discussed intensive lifestyle modifications today with an emphasis on specific weight loss instructions and strategies.   Repetitive spaced learning was employed today to elicit superior memory formation and behavioral change.  7. Class 3 severe obesity with serious comorbidity and body mass index (BMI) of 50.0 to 59.9 in adult, unspecified obesity type (HCC) [name] is currently in the action stage of change. As such, her goal is to continue with weight loss efforts. She has agreed to keeping a food journal and adhering to recommended goals of 1500 calories and 85 grams of protein.   Exercise goals: For substantial health benefits, adults should do at least 150 minutes (2 hours and 30 minutes) a week of moderate-intensity, or 75 minutes (1 hour and 15 minutes) a week of vigorous-intensity aerobic physical activity, or an equivalent  combination of moderate- and vigorous-intensity aerobic activity. Aerobic activity should be performed in episodes of at least 10 minutes, and preferably, it should be spread throughout the week.  Behavioral modification strategies: increasing lean protein intake, decreasing simple carbohydrates, increasing vegetables and increasing high fiber foods.  [name] has agreed to follow-up with our clinic in 2-3 weeks. She was informed of the importance of frequent follow-up visits to maximize her success with intensive lifestyle modifications for her multiple health conditions.   Objective:   Blood pressure (!) 136/94, pulse 76, temperature 98 F (36.7 C), temperature source Oral, height 5\' 5"  (1.651 m), weight 300 lb (136.1 kg), last menstrual period 11/07/2012, SpO2 95 %. Body mass index is 49.92 kg/m.  General: Cooperative, alert, well developed, in no acute distress. HEENT: Conjunctivae and lids unremarkable. Cardiovascular: Regular rhythm.  Lungs: Normal work of breathing. Neurologic: No focal deficits.   Lab Results  Component Value Date   CREATININE 0.81 05/19/2020   BUN 8 05/19/2020   NA 140 05/19/2020   K 4.4 05/19/2020   CL 102 05/19/2020   CO2 27 05/19/2020  Lab Results  Component Value Date   ALT 30 05/19/2020   AST 27 05/19/2020   ALKPHOS 108 05/19/2020   BILITOT 0.2 05/19/2020   Lab Results  Component Value Date   HGBA1C 5.7 (H) 05/13/2020   HGBA1C 5.4 02/25/2013   Lab Results  Component Value Date   TSH 1.750 05/13/2020   Lab Results  Component Value Date   CHOL 198 12/20/2008   HDL 37.4 (L) 12/20/2008   LDLCALC 138 (H) 12/20/2008   TRIG 111 12/20/2008   CHOLHDL 5.3 CALC 12/20/2008   Lab Results  Component Value Date   WBC 7.2 05/19/2020   HGB 12.8 05/19/2020   HCT 39.2 05/19/2020   MCV 95 05/19/2020   PLT 260 05/19/2020   Lab Results  Component Value Date   IRON 93 05/19/2020   TIBC 287 05/19/2020   FERRITIN 130 05/19/2020   Attestation  Statements:   Reviewed by clinician on day of visit: allergies, medications, problem list, medical history, surgical history, family history, social history, and previous encounter notes.  I, Insurance claims handler, CMA, am acting as transcriptionist for Helane Rima, DO  I have reviewed the above documentation for accuracy and completeness, and I agree with the above. Helane Rima, DO

## 2020-07-05 ENCOUNTER — Other Ambulatory Visit: Payer: Self-pay

## 2020-07-05 ENCOUNTER — Encounter (INDEPENDENT_AMBULATORY_CARE_PROVIDER_SITE_OTHER): Payer: Self-pay | Admitting: Family Medicine

## 2020-07-05 ENCOUNTER — Ambulatory Visit (INDEPENDENT_AMBULATORY_CARE_PROVIDER_SITE_OTHER): Payer: BC Managed Care – PPO | Admitting: Family Medicine

## 2020-07-05 VITALS — BP 137/77 | HR 72 | Temp 97.6°F | Ht 65.0 in | Wt 303.0 lb

## 2020-07-05 DIAGNOSIS — Z6841 Body Mass Index (BMI) 40.0 and over, adult: Secondary | ICD-10-CM

## 2020-07-05 DIAGNOSIS — E559 Vitamin D deficiency, unspecified: Secondary | ICD-10-CM | POA: Diagnosis not present

## 2020-07-05 DIAGNOSIS — R7303 Prediabetes: Secondary | ICD-10-CM | POA: Diagnosis not present

## 2020-07-05 DIAGNOSIS — E538 Deficiency of other specified B group vitamins: Secondary | ICD-10-CM | POA: Diagnosis not present

## 2020-07-05 DIAGNOSIS — Z9884 Bariatric surgery status: Secondary | ICD-10-CM | POA: Diagnosis not present

## 2020-07-05 DIAGNOSIS — F418 Other specified anxiety disorders: Secondary | ICD-10-CM

## 2020-07-05 DIAGNOSIS — Z9189 Other specified personal risk factors, not elsewhere classified: Secondary | ICD-10-CM | POA: Diagnosis not present

## 2020-07-05 MED ORDER — PROPRANOLOL HCL 20 MG PO TABS: 20.0000 mg | ORAL_TABLET | Freq: Three times a day (TID) | ORAL | 0 refills | Status: DC | PRN

## 2020-07-05 MED ORDER — LORAZEPAM 0.5 MG PO TABS: 0.5000 mg | ORAL_TABLET | Freq: Three times a day (TID) | ORAL | 0 refills | Status: DC | PRN

## 2020-07-05 NOTE — Progress Notes (Signed)
Chief Complaint:   OBESITY [name] is here to discuss her progress with her obesity treatment plan along with follow-up of her obesity related diagnoses. [name] is on keeping a food journal and adhering to recommended goals of 1500 calories and 100+ grams of protein and states she is following her eating plan approximately 30-40% of the time. Ketura states she is exercising for 0 minutes 0 times per week.  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  She tried Orgain powder and says it was too chalky for her.  She admits to not getting in enough water.  She is in the process of transitioning PCPs.  She reports having a lot of increased anxiety.  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  She does not feel safe.  Assessment/Plan:   1. B12 deficiency  Lab Results  Component Value Date   VITAMINB12 331 05/19/2020   Not at goal. Goal > 400.   2. Vitamin D deficiency Current vitamin D is 18.3, tested on 05/19/2020. Not at goal. Optimal goal > 50 ng/dL. There is also evidence to support a goal of >70 ng/dL in patients with cancer and heart disease. Plan: Continue Vitamin D @50 ,000 IU every week with follow-up for routine testing of Vitamin D at least 2-3 times per year to avoid over-replacement.  3. Prediabetes Not at goal. Goal is HgbA1c < 5.7 and insulin level closer to 5.   Lab Results  Component Value Date   HGBA1C 5.7 (H) 05/13/2020   Melika will continue to work on weight loss, exercise, and decreasing simple carbohydrates to help decrease the risk of diabetes.   4. Personal history of gastric banding [name] had gastric banding in 09/2011 with Dr. 10/2011. She is status post removal in 2019.  She had some complications during thelast year (prior to removal). She is taking a multivitamin and probiotics.  5. Situational anxiety, with emotional eating Severe, with right TMJ and emotional eating.  Will provide courtesy refill of Ativan today, since she has no PCP. No refills. I am going to have her try Propranolol in place of Ativan to see if it helps to control acute anxiety and muscle tension. After discussion, patient would like to trial it. Expectations, risks, and potential side effects reviewed.   -Refill LORazepam (ATIVAN) 0.5 MG tablet; Take 1 tablet (0.5 mg total) by mouth every 8 (eight) hours as needed for anxiety.  Dispense: 12 tablet; Refill: 0 -Start propranolol (INDERAL) 20 MG tablet; Take 1 tablet (20 mg total) by mouth 3 (three) times daily as needed (anxiety).  Dispense: 30 tablet; Refill: 0  I have consulted the [place] Controlled Substances Registry for this patient, and feel the risk/benefit ratio today is favorable for proceeding with this prescription for a controlled substance. The patient understands monitoring parameters and red flags.   6. At risk for dehydration Leisha was given approximately 15 minutes dehydration prevention counseling today. [name] is at risk for dehydration due to not drinking water. She was encouraged to hydrate and monitor fluid status to avoid dehydration as well as weight loss plateaus.   7. Class 3 severe obesity with serious comorbidity and body mass index (BMI) of 50.0 to 59.9 in  adult, unspecified obesity type [facility]) [name] is currently in the action stage of change. As such, her goal is to continue with weight loss efforts. She has agreed to keeping a food journal and adhering to recommended goals of 1500 calories and 100 protein.   Exercise goals: For substantial health benefits, adults should do at least 150 minutes (2 hours and 30 minutes) a week of moderate-intensity, or 75 minutes (1 hour and 15 minutes) a week of  vigorous-intensity aerobic physical activity, or an equivalent combination of moderate- and vigorous-intensity aerobic activity. Aerobic activity should be performed in episodes of at least 10 minutes, and preferably, it should be spread throughout the week.  Behavioral modification strategies: increasing lean protein intake, emotional eating strategies and planning for success.  [name] has agreed to follow-up with our clinic in 2-3 weeks. She was informed of the importance of frequent follow-up visits to maximize her success with intensive lifestyle modifications for her multiple health conditions.   Objective:   Blood pressure 137/77, pulse 72, temperature 97.6 F (36.4 C), temperature source Oral, height 5\' 5"  (1.651 m), weight (!) 303 lb (137.4 kg), last menstrual period 11/07/2012, SpO2 98 %. Body mass index is 50.42 kg/m.  General: Cooperative, alert, well developed, in no acute distress. HEENT: Conjunctivae and lids unremarkable. Cardiovascular: Regular rhythm.  Lungs: Normal work of breathing. Neurologic: No focal deficits.   Lab Results  Component Value Date   CREATININE 0.81 05/19/2020   BUN 8 05/19/2020   NA 140 05/19/2020   K 4.4 05/19/2020   CL 102 05/19/2020   CO2 27 05/19/2020   Lab Results  Component Value Date   ALT 30 05/19/2020   AST 27 05/19/2020   ALKPHOS 108 05/19/2020   BILITOT 0.2 05/19/2020   Lab Results  Component Value Date   HGBA1C 5.7 (H) 05/13/2020   HGBA1C 5.4 02/25/2013   Lab Results  Component Value Date   TSH 1.750 05/13/2020   Lab Results  Component Value Date   CHOL 198 12/20/2008   HDL 37.4 (L) 12/20/2008   LDLCALC 138 (H) 12/20/2008   TRIG 111 12/20/2008   CHOLHDL 5.3 CALC 12/20/2008   Lab Results  Component Value Date   WBC 7.2 05/19/2020   HGB 12.8 05/19/2020   HCT 39.2 05/19/2020   MCV 95 05/19/2020   PLT 260 05/19/2020   Lab Results  Component Value Date   IRON 93 05/19/2020   TIBC 287 05/19/2020   FERRITIN 130  05/19/2020   Attestation Statements:   Reviewed by clinician on day of visit: allergies, medications, problem list, medical history, surgical history, family history, social history, and previous encounter notes.  I, 05/21/2020, CMA, am acting as transcriptionist for Insurance claims handler, DO  I have reviewed the above documentation for accuracy and completeness, and I agree with the above. Helane Rima, DO

## 2020-07-16 IMAGING — XA DG MYELOGRAPHY LUMBAR INJ LUMBOSACRAL
7 of 17 series · 7 of 17 positions shown · non-contrast
Comparison: 11/14/2017

CLINICAL DATA: Low back pain
TECHNIQUE: Contiguous axial images were obtained through the Lumbar spine after
the intrathecal infusion of infusion. Coronal and sagittal
reconstructions were obtained of the axial image sets.

[Series 1: vasc adipose · 1 of 1 slices shown (1 of 4)]
[im 1/1]
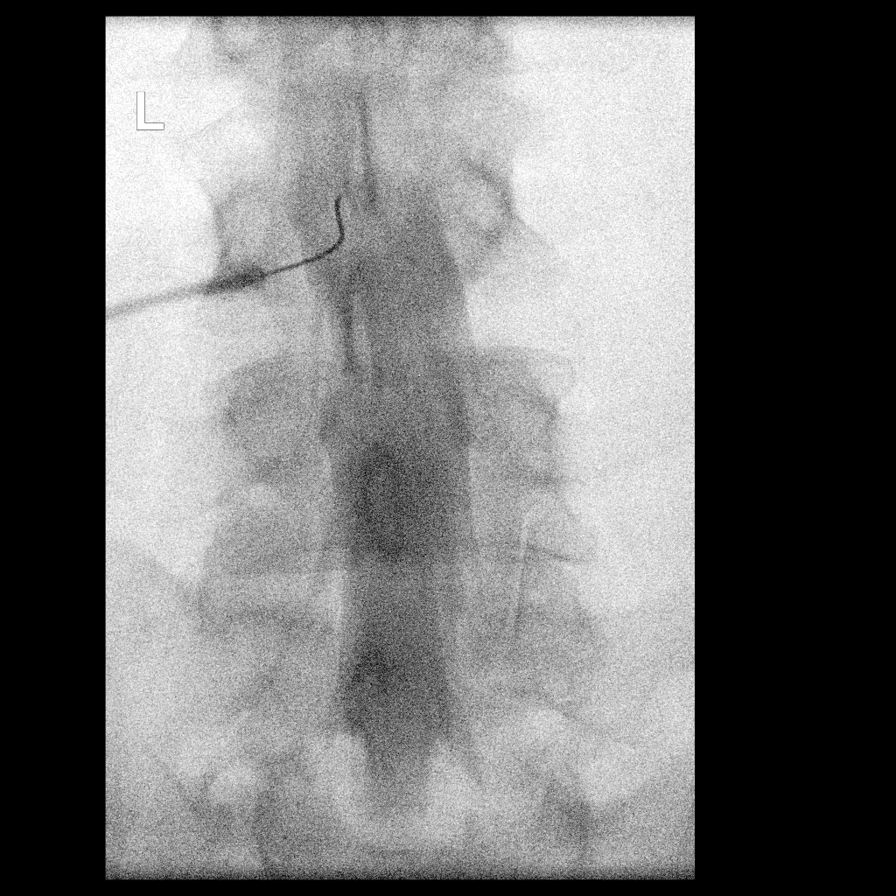

[Series 2: w lumbar spine flexion · 0.15mm/px · 1 of 1 slices shown]
[im 1/1]
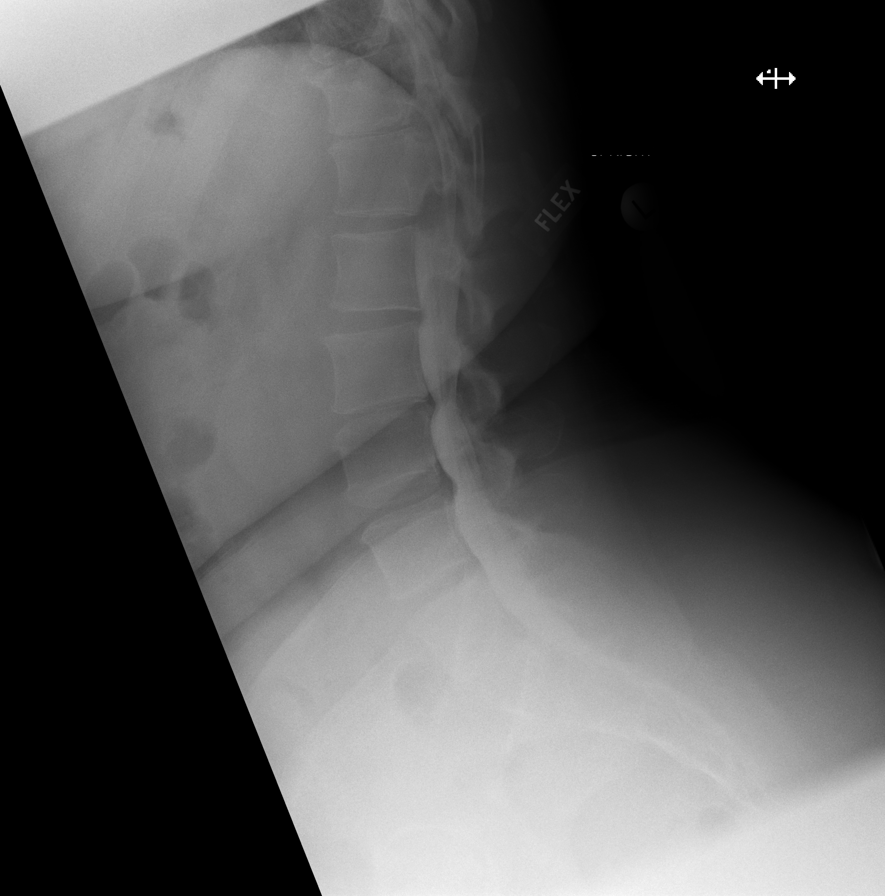

[Series 2: vasc adipose · 1 of 1 slices shown (2 of 4)]
[im 1/1]
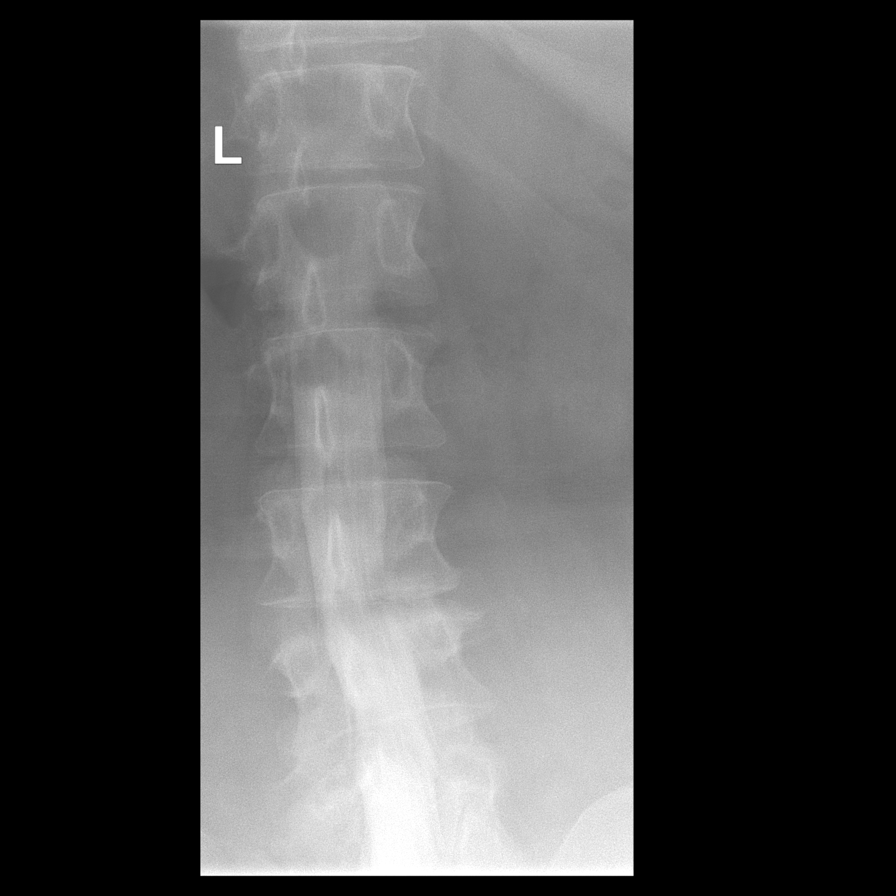

[Series 3: w lumbar spine extension · 0.15mm/px · 1 of 1 slices shown]
[im 1/1]
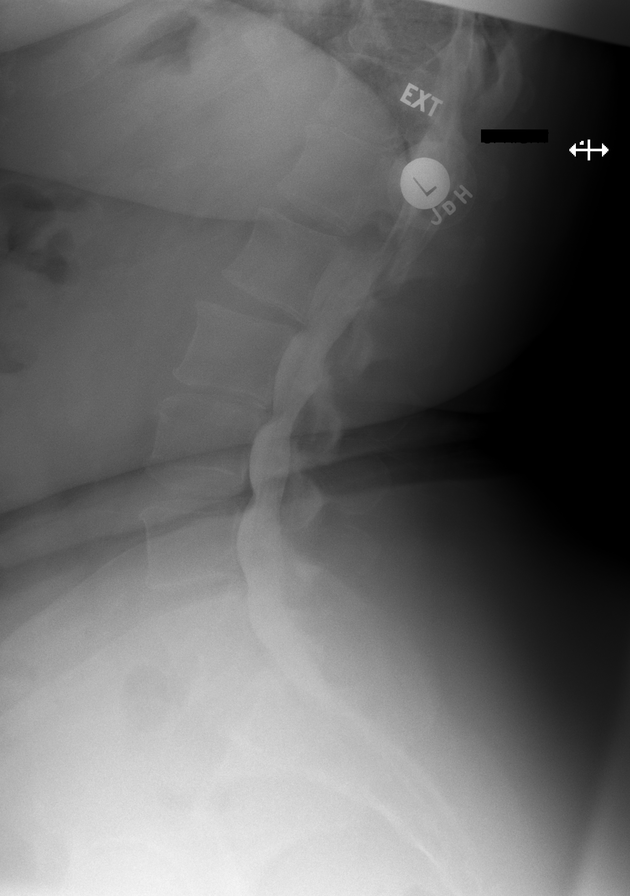

[Series 3: vasc adipose · 1 of 1 slices shown (3 of 4)]
[im 1/1]
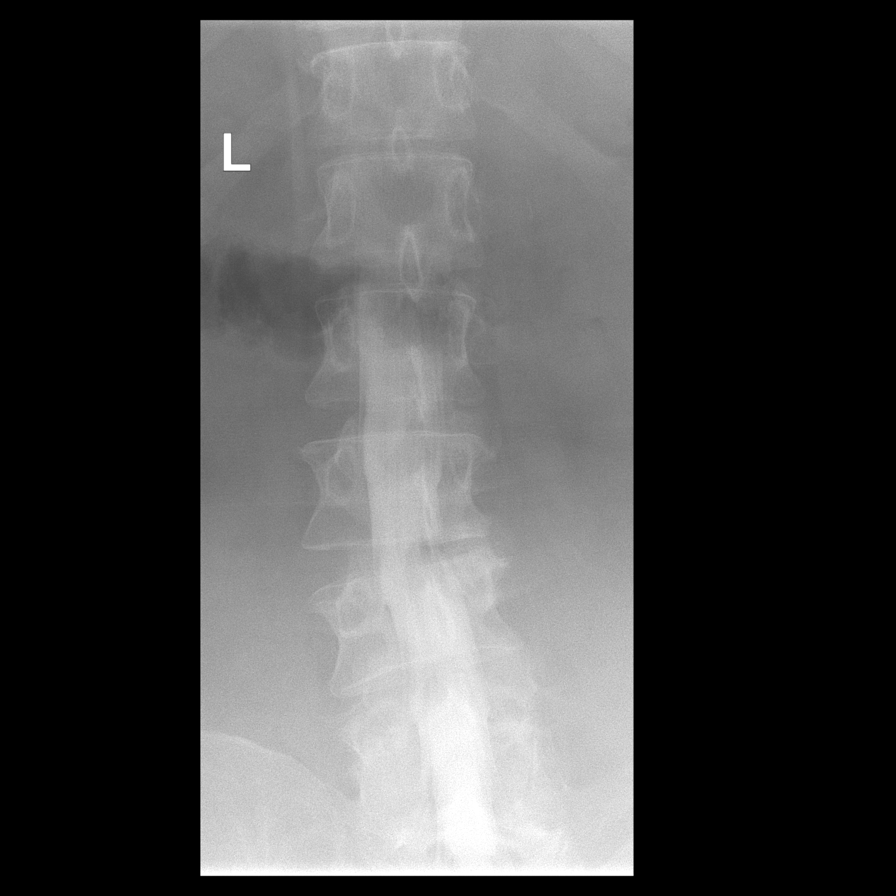

[Series 4: w lumbar spine lat · 0.15mm/px · 1 of 1 slices shown]
[im 1/1]
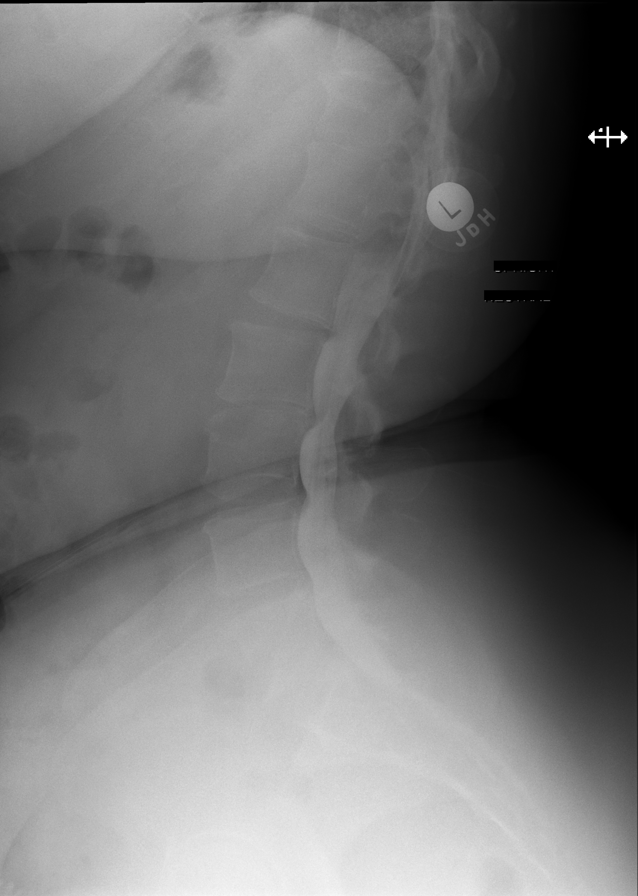

[Series 4: vasc adipose · 1 of 1 slices shown (4 of 4)]
[im 1/1]
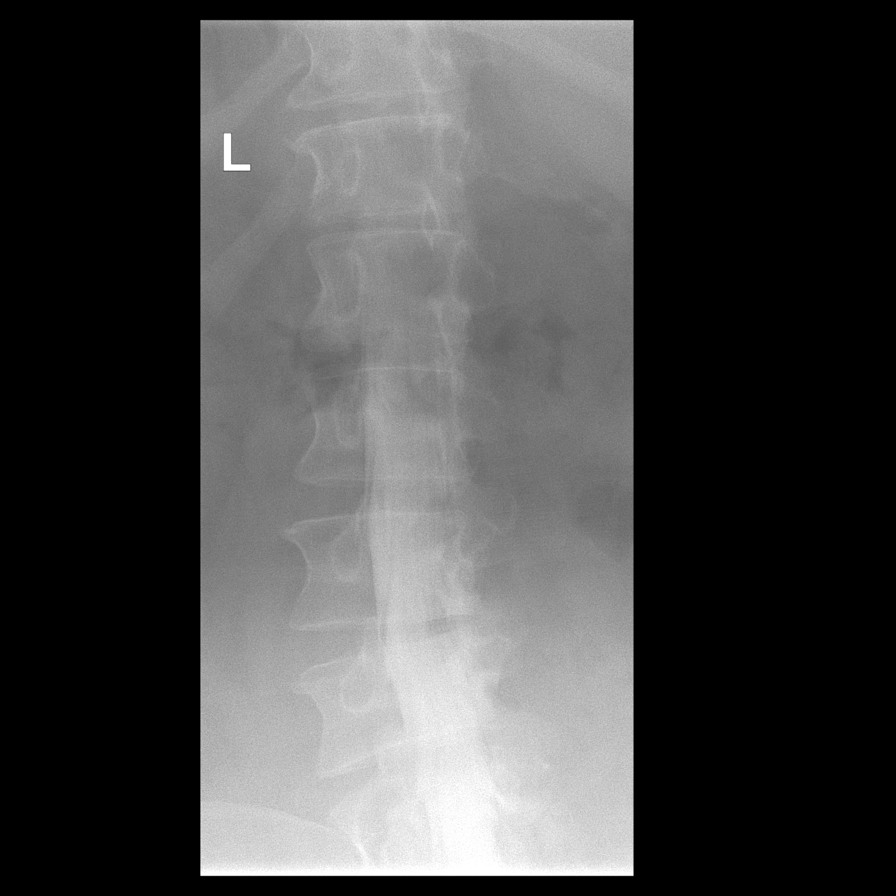

[7 of 17 positions shown; findings below may reference images not displayed]

EXAM:
LUMBAR MYELOGRAM

FLUOROSCOPY TIME:  36 seconds.  19.9 mGy.

PROCEDURE:
After thorough discussion of risks and benefits of the procedure
including bleeding, infection, injury to nerves, blood vessels,
adjacent structures as well as headache and CSF leak, written and
oral informed consent was obtained. Consent was obtained by Dr.
Janine Art. Time out form was completed.

Patient was positioned prone on the fluoroscopy table. Local
anesthesia was provided with 1% lidocaine without epinephrine after
prepped and draped in the usual sterile fashion. Puncture was
performed at L3-4 using a 3 1/2 inch 22-gauge spinal needle via a
left paramedian approach. Using a single pass through the dura, the
needle was placed within the thecal sac, with return of clear CSF.
15 mL of Isovue [id number] was injected into the thecal sac, with normal
opacification of the nerve roots and cauda equina consistent with
free flow within the subarachnoid space.

I personally performed the lumbar puncture and administered the
intrathecal contrast. I also personally supervised acquisition of
the myelogram images.
FINDINGS: Mild levoscoliosis at L3-4. There is anatomic alignment on the
lateral view in the upright standing position.

No vertebral compression deformity.

There is moderate anterior epidural mass effect at L3-4. Mild
anterior epidural mass effect at L1-2 and L2-3. There is significant
narrowing of the L2-3 disc. No obvious spinal stenosis.

Flexion and extension views demonstrate limited range of motion but
no abnormal motion to suggest instability.
IMPRESSION: Mild scattered degenerative disc disease.  CT myelogram to follow.

## 2020-07-27 ENCOUNTER — Other Ambulatory Visit: Payer: Self-pay

## 2020-07-27 ENCOUNTER — Ambulatory Visit (INDEPENDENT_AMBULATORY_CARE_PROVIDER_SITE_OTHER): Payer: BC Managed Care – PPO | Admitting: Family Medicine

## 2020-07-27 ENCOUNTER — Encounter (INDEPENDENT_AMBULATORY_CARE_PROVIDER_SITE_OTHER): Payer: Self-pay | Admitting: Family Medicine

## 2020-07-27 VITALS — BP 131/82 | HR 72 | Temp 97.9°F | Ht 65.0 in | Wt 304.0 lb

## 2020-07-27 DIAGNOSIS — E559 Vitamin D deficiency, unspecified: Secondary | ICD-10-CM

## 2020-07-27 DIAGNOSIS — Z6841 Body Mass Index (BMI) 40.0 and over, adult: Secondary | ICD-10-CM

## 2020-07-27 DIAGNOSIS — E66813 Obesity, class 3: Secondary | ICD-10-CM

## 2020-07-27 DIAGNOSIS — R7303 Prediabetes: Secondary | ICD-10-CM | POA: Diagnosis not present

## 2020-07-27 DIAGNOSIS — Z9189 Other specified personal risk factors, not elsewhere classified: Secondary | ICD-10-CM

## 2020-07-27 DIAGNOSIS — F418 Other specified anxiety disorders: Secondary | ICD-10-CM | POA: Diagnosis not present

## 2020-07-27 MED ORDER — VITAMIN D (ERGOCALCIFEROL) 1.25 MG (50000 UNIT) PO CAPS: 50000.0000 [IU] | ORAL_CAPSULE | ORAL | 0 refills | Status: DC

## 2020-07-27 MED ORDER — PROPRANOLOL HCL 20 MG PO TABS: 20.0000 mg | ORAL_TABLET | Freq: Three times a day (TID) | ORAL | 0 refills | Status: DC | PRN

## 2020-07-28 NOTE — Progress Notes (Signed)
Chief Complaint:   OBESITY [name] is here to discuss her progress with her obesity treatment plan along with follow-up of her obesity related diagnoses. [name] is on keeping a food journal and adhering to recommended goals of 1500 calories and 100+ grams of protein and states she is following her eating plan approximately 25% of the time. [name] states she has increased her walking.  Today's visit was #: 4 Starting weight: 306 lbs Starting date: 05/19/2020 Today's weight: 304 lbs Today's date: 07/27/2020 Total lbs lost to date: 2 lbs Total lbs lost since last in-office visit: 0 Total weight loss percentage to date: -0.65%  Interim History: [name] says her mood is much improved.  She applied for a new job.  She says propranolol is helping.  Assessment/Plan:   1. Vitamin D deficiency Current vitamin D is 18.3, tested on 05/19/2020. Not at goal. Optimal goal > 50 ng/dL. There is also evidence to support a goal of >70 ng/dL in patients with cancer and heart disease. Plan: Continue Vitamin D @50 ,000 IU every week with follow-up for routine testing of Vitamin D at least 2-3 times per year to avoid over-replacement.  -Refill.  Vitamin D, Ergocalciferol, (DRISDOL) 1.25 MG (50000 UNIT) CAPS capsule; Take 1 capsule (50,000 Units total) by mouth every 7 (seven) days.  Dispense: 4 capsule; Refill: 0  2. Prediabetes Not optimized. Goal is HgbA1c < 5.7 and insulin level closer to 5. She will continue to focus on protein-rich, low simple carbohydrate foods. We reviewed the importance of hydration, regular exercise for stress reduction, and restorative sleep.   Lab Results  Component Value Date   HGBA1C 5.7 (H) 05/13/2020   3. Situational anxiety Improving.  Behavior modification techniques were discussed today to help [name] deal with her anxiety.  Orders and follow up as documented in patient record.   -Refill propranolol (INDERAL) 20 MG tablet; Take 1 tablet (20 mg total) by mouth 3  (three) times daily as needed (anxiety).  Dispense: 30 tablet; Refill: 0  4. At risk for heart disease [name] was given approximately 15 minutes of coronary artery disease prevention counseling today. She is 44 y.o. female and has risk factors for heart disease including obesity and prediabetes. We again discussed intensive lifestyle modifications today with an emphasis on specific weight loss instructions and strategies.   During insulin resistance, several metabolic alterations induce the development of cardiovascular disease. For instance, insulin resistance can induce an imbalance in glucose metabolism that generates chronic hyperglycemia, which in turn triggers oxidative stress and causes an inflammatory response that leads to cell damage. Insulin resistance can also alter systemic lipid metabolism which then leads to the development of dyslipidemia and the well-known lipid triad: (1) high levels of plasma triglycerides, (2) low levels of high-density lipoprotein, and (3) the appearance of small dense low-density lipoproteins. This triad, along with endothelial dysfunction, which can also be induced by aberrant insulin signaling, contribute to atherosclerotic plaque formation.   5. Class 3 severe obesity with serious comorbidity and body mass index (BMI) of 50.0 to 59.9 in adult, unspecified obesity type (HCC)  [name] is currently in the action stage of change. As such, her goal is to continue with weight loss efforts. She has agreed to keeping a food journal and adhering to recommended goals of 1500 calories and 100+ grams of protein.   Exercise goals: For substantial health benefits, adults should do at least 150 minutes (2 hours and 30 minutes) a week of moderate-intensity, or 75 minutes (  1 hour and 15 minutes) a week of vigorous-intensity aerobic physical activity, or an equivalent combination of moderate- and vigorous-intensity aerobic activity. Aerobic activity should be performed in episodes  of at least 10 minutes, and preferably, it should be spread throughout the week.  Behavioral modification strategies: increasing lean protein intake, decreasing simple carbohydrates and increasing vegetables.  [name] has agreed to follow-up with our clinic in 2 weeks. She was informed of the importance of frequent follow-up visits to maximize her success with intensive lifestyle modifications for her multiple health conditions.   Objective:   Blood pressure 131/82, pulse 72, temperature 97.9 F (36.6 C), temperature source Oral, height 5\' 5"  (1.651 m), weight (!) 304 lb (137.9 kg), last menstrual period 11/07/2012, SpO2 97 %. Body mass index is 50.59 kg/m.  General: Cooperative, alert, well developed, in no acute distress. HEENT: Conjunctivae and lids unremarkable. Cardiovascular: Regular rhythm.  Lungs: Normal work of breathing. Neurologic: No focal deficits.   Lab Results  Component Value Date   CREATININE 0.81 05/19/2020   BUN 8 05/19/2020   NA 140 05/19/2020   K 4.4 05/19/2020   CL 102 05/19/2020   CO2 27 05/19/2020   Lab Results  Component Value Date   ALT 30 05/19/2020   AST 27 05/19/2020   ALKPHOS 108 05/19/2020   BILITOT 0.2 05/19/2020   Lab Results  Component Value Date   HGBA1C 5.7 (H) 05/13/2020   HGBA1C 5.4 02/25/2013   Lab Results  Component Value Date   TSH 1.750 05/13/2020   Lab Results  Component Value Date   CHOL 198 12/20/2008   HDL 37.4 (L) 12/20/2008   LDLCALC 138 (H) 12/20/2008   TRIG 111 12/20/2008   CHOLHDL 5.3 CALC 12/20/2008   Lab Results  Component Value Date   WBC 7.2 05/19/2020   HGB 12.8 05/19/2020   HCT 39.2 05/19/2020   MCV 95 05/19/2020   PLT 260 05/19/2020   Lab Results  Component Value Date   IRON 93 05/19/2020   TIBC 287 05/19/2020   FERRITIN 130 05/19/2020   Attestation Statements:   Reviewed by clinician on day of visit: allergies, medications, problem list, medical history, surgical history, family history,  social history, and previous encounter notes.  I, 05/21/2020, CMA, am acting as transcriptionist for Insurance claims handler, DO  I have reviewed the above documentation for accuracy and completeness, and I agree with the above. Helane Rima, DO

## 2020-08-01 ENCOUNTER — Ambulatory Visit: Payer: BC Managed Care – PPO | Admitting: Internal Medicine

## 2020-08-17 ENCOUNTER — Ambulatory Visit: Payer: BC Managed Care – PPO | Admitting: Internal Medicine

## 2020-08-18 ENCOUNTER — Ambulatory Visit (INDEPENDENT_AMBULATORY_CARE_PROVIDER_SITE_OTHER): Payer: BC Managed Care – PPO | Admitting: Family Medicine

## 2020-08-18 ENCOUNTER — Other Ambulatory Visit: Payer: Self-pay

## 2020-08-18 ENCOUNTER — Encounter (INDEPENDENT_AMBULATORY_CARE_PROVIDER_SITE_OTHER): Payer: Self-pay | Admitting: Family Medicine

## 2020-08-18 ENCOUNTER — Ambulatory Visit: Payer: BC Managed Care – PPO | Admitting: Family Medicine

## 2020-08-18 VITALS — BP 116/76 | HR 68 | Temp 97.9°F | Ht 65.0 in | Wt 296.0 lb

## 2020-08-18 DIAGNOSIS — R7303 Prediabetes: Secondary | ICD-10-CM

## 2020-08-18 DIAGNOSIS — Z6841 Body Mass Index (BMI) 40.0 and over, adult: Secondary | ICD-10-CM

## 2020-08-18 DIAGNOSIS — F439 Reaction to severe stress, unspecified: Secondary | ICD-10-CM | POA: Diagnosis not present

## 2020-08-18 DIAGNOSIS — E559 Vitamin D deficiency, unspecified: Secondary | ICD-10-CM

## 2020-08-24 NOTE — Progress Notes (Signed)
Chief Complaint:   OBESITY [name] is here to discuss her progress with her obesity treatment plan along with follow-up of her obesity related diagnoses.   Today's visit was #: 5 Starting weight: 306 lbs Starting date: 05/19/2020 Today's weight: 296 lbs Today's date: 08/18/2020 Total lbs lost to date: 10 lbs Body mass index is 49.26 kg/m.  Total weight loss percentage to date: -3.27%  Interim History:  [name] says that she has an interview today for a new position!  She is hoping for better work-life balance.  She strained her back and is working with a Land and getting acupuncture.  She has had some chocolate-peanut butter cookies that are 10-13 grams of protein and 200 calories.  She has increased her water intake, she says.  Nutrition Plan: [name] is keeping a food journal and adhering to recommended goals of 1500 calories and 100+ grams of protein for 60% of the time. Anti-obesity medications: None. Activity: [name] says she has increased the amount of walking she has been doing.  Assessment/Plan:   1. Prediabetes Goal is HgbA1c < 5.7 and insulin level closer to 5.  She is not on any medication for this. She will continue to focus on protein-rich, low simple carbohydrate foods. We reviewed the importance of hydration, regular exercise for stress reduction, and restorative sleep.   Lab Results  Component Value Date   HGBA1C 5.7 (H) 05/13/2020   2. Vitamin D deficiency Current vitamin D is 18.3, tested on 05/19/2020. Not at goal. Optimal goal > 50 ng/dL.   Plan:  [x]   Continue Vitamin D @50 ,000 IU every week. []   Continue home supplement daily. [x]   Follow-up for routine testing of Vitamin D at least 2-3 times per year to avoid over-replacement. []   Monitored by PCP.  3. Situational stress Improving.  Behavior modification techniques were discussed today to help [name] deal with her anxiety.  Orders and follow up as documented in patient record.  She is  taking propranolol 20 mg three times daily as needed and lorazepam 0.5 mg every 8 hours as needed for anxiety.    Plan:  Continue lorazepam and propranolol as needed.  4. Class 3 severe obesity with serious comorbidity and body mass index (BMI) of 45.0 to 49.9 in adult, unspecified obesity type (HCC)  [name] is currently in the action stage of change. As such, her goal is to continue with weight loss efforts.   Nutrition goals: She has agreed to keeping a food journal and adhering to recommended goals of 1500 calories and 100 grams of protein.   Exercise goals: As tolerated.  Behavioral modification strategies: increasing lean protein intake, decreasing simple carbohydrates, increasing vegetables and increasing water intake.  [name] has agreed to follow-up with our clinic in 3 weeks. She was informed of the importance of frequent follow-up visits to maximize her success with intensive lifestyle modifications for her multiple health conditions.   Objective:   Blood pressure 116/76, pulse 68, temperature 97.9 F (36.6 C), temperature source Oral, height 5\' 5"  (1.651 m), weight 296 lb (134.3 kg), last menstrual period 11/07/2012, SpO2 96 %. Body mass index is 49.26 kg/m.  General: Cooperative, alert, well developed, in no acute distress. HEENT: Conjunctivae and lids unremarkable. Cardiovascular: Regular rhythm.  Lungs: Normal work of breathing. Neurologic: No focal deficits.   Lab Results  Component Value Date   CREATININE 0.81 05/19/2020   BUN 8 05/19/2020   NA 140 05/19/2020   K 4.4 05/19/2020   CL 102  05/19/2020   CO2 27 05/19/2020   Lab Results  Component Value Date   ALT 30 05/19/2020   AST 27 05/19/2020   ALKPHOS 108 05/19/2020   BILITOT 0.2 05/19/2020   Lab Results  Component Value Date   HGBA1C 5.7 (H) 05/13/2020   HGBA1C 5.4 02/25/2013   Lab Results  Component Value Date   TSH 1.750 05/13/2020   Lab Results  Component Value Date   CHOL 198 12/20/2008    HDL 37.4 (L) 12/20/2008   LDLCALC 138 (H) 12/20/2008   TRIG 111 12/20/2008   CHOLHDL 5.3 CALC 12/20/2008   Lab Results  Component Value Date   WBC 7.2 05/19/2020   HGB 12.8 05/19/2020   HCT 39.2 05/19/2020   MCV 95 05/19/2020   PLT 260 05/19/2020   Lab Results  Component Value Date   IRON 93 05/19/2020   TIBC 287 05/19/2020   FERRITIN 130 05/19/2020   Attestation Statements:   Reviewed by clinician on day of visit: allergies, medications, problem list, medical history, surgical history, family history, social history, and previous encounter notes.  Time spent on visit including pre-visit chart review and post-visit care and charting was 30 minutes.   I, Insurance claims handler, CMA, am acting as transcriptionist for Helane Rima, DO  I have reviewed the above documentation for accuracy and completeness, and I agree with the above. Helane Rima, DO

## 2020-08-25 ENCOUNTER — Encounter (INDEPENDENT_AMBULATORY_CARE_PROVIDER_SITE_OTHER): Payer: Self-pay | Admitting: Family Medicine

## 2020-08-25 DIAGNOSIS — E559 Vitamin D deficiency, unspecified: Secondary | ICD-10-CM

## 2020-08-29 MED ORDER — VITAMIN D (ERGOCALCIFEROL) 1.25 MG (50000 UNIT) PO CAPS: 50000.0000 [IU] | ORAL_CAPSULE | ORAL | 0 refills | Status: DC

## 2020-08-30 ENCOUNTER — Telehealth: Payer: Self-pay

## 2020-08-30 NOTE — Telephone Encounter (Signed)
Please send in Gabapentin to [facility] on [address]   Pt takes it 1 capules by mouth 3 times daily  300mg 

## 2020-08-31 ENCOUNTER — Telehealth: Payer: BC Managed Care – PPO | Admitting: Family Medicine

## 2020-08-31 ENCOUNTER — Other Ambulatory Visit: Payer: Self-pay

## 2020-08-31 MED ORDER — GABAPENTIN 300 MG PO CAPS: 300.0000 mg | ORAL_CAPSULE | Freq: Three times a day (TID) | ORAL | 0 refills | Status: DC

## 2020-08-31 NOTE — Telephone Encounter (Signed)
Rx sent in

## 2020-09-08 ENCOUNTER — Encounter: Payer: Self-pay | Admitting: Family Medicine

## 2020-09-08 ENCOUNTER — Other Ambulatory Visit: Payer: Self-pay

## 2020-09-08 ENCOUNTER — Ambulatory Visit (INDEPENDENT_AMBULATORY_CARE_PROVIDER_SITE_OTHER): Payer: BC Managed Care – PPO | Admitting: Family Medicine

## 2020-09-08 DIAGNOSIS — F418 Other specified anxiety disorders: Secondary | ICD-10-CM | POA: Diagnosis not present

## 2020-09-08 DIAGNOSIS — G43009 Migraine without aura, not intractable, without status migrainosus: Secondary | ICD-10-CM | POA: Diagnosis not present

## 2020-09-08 DIAGNOSIS — Z6841 Body Mass Index (BMI) 40.0 and over, adult: Secondary | ICD-10-CM

## 2020-09-08 DIAGNOSIS — M797 Fibromyalgia: Secondary | ICD-10-CM

## 2020-09-08 DIAGNOSIS — J4520 Mild intermittent asthma, uncomplicated: Secondary | ICD-10-CM | POA: Diagnosis not present

## 2020-09-08 DIAGNOSIS — Z9989 Dependence on other enabling machines and devices: Secondary | ICD-10-CM

## 2020-09-08 DIAGNOSIS — G4733 Obstructive sleep apnea (adult) (pediatric): Secondary | ICD-10-CM

## 2020-09-08 MED ORDER — BUTALBITAL-APAP-CAFFEINE 50-325-40 MG PO TABS: 1.0000 | ORAL_TABLET | Freq: Four times a day (QID) | ORAL | 0 refills | Status: DC | PRN

## 2020-09-08 MED ORDER — PROPRANOLOL HCL 20 MG PO TABS: 20.0000 mg | ORAL_TABLET | Freq: Three times a day (TID) | ORAL | 0 refills | Status: DC | PRN

## 2020-09-08 NOTE — Progress Notes (Signed)
Subjective:  Patient ID: [name], female    DOB: 08-02-1976  Age: 44 y.o. MRN: [id number]  CC:  Chief Complaint  Patient presents with  . Follow-up    needs med refills      HPI  HPI [name] is a very pleasant 44 year old female with past medical history of depression, fibromyalgia, chronic back pain status post anterior lateral lumbar fusion in May 11, 2019, morbid obesity status post laparoscopic gastric banding in 2012, mild intermittent asthma presents in our clinic for the first time for establishment of care and discuss about her chronic medical issues.  Since her gastric surgery (reversed) in 2012 she started having food allergy which includes honey, oranges, gluten, whey protein, cheese, cauliflower, cashew nuts-she is followed by nutritionist for dietary plan.  She is working on her diet to lose weight.  Her diet is very limited due to multiple food allergy. [facility].  She has chronic back pain.  She underwent lumbar fusion surgery in July 2020.  Reports that her back pain has improved but not resolved yet.  She has fibromyalgia for which she is followed by rheumatologist at [facility]. She takes Cymbalta 60 mg twice daily for back pain, fibromyalgia and depression.  She also takes BuSpar 10 mg twice daily for depression for anxiety.  She denies depressed mood, suicidal or homicidal thoughts.  She has mild intermittent asthma for which she takes albuterol as needed.But recently is using 1-2 times weekly due to allergies.  Has history of migraine for which she takes Fioricet as needed. She reports up to 10 migraines a month.   She is up-to-date on mammogram and Pap smear.  Her last mammogram was 2020 year which came back negative.  She had Pap smear 2 years ago which was also negative.  She is up-to-date on all immunizations including COVID-19 vaccine.  She denies having any sleep trouble outside when her back bothers her she  sleeps very well with her CPAP.  She denies having any changes in chewing or swallowing.  Does have a night guard because she was grinding her teeth.  She denies having any changes in bowel or bladder habits.  No blood in urine or stool.  Denies having any memory issues.  Denies have any skin, hearing or vision issues.  Does wear bifocals.  Sees an eye doctor regularly.  Today patient denies signs and symptoms of COVID 19 infection including fever, chills, cough, shortness of breath, and headache. Past Medical, Surgical, Social History, Allergies, and Medications have been Reviewed.   Past Medical History:  Diagnosis Date  . ADD (attention deficit disorder)   . Allergies   . Anemia   . Arthritis   . Asthma    EXERCISE INDUCED  . B12 deficiency   . Bilateral ovarian cysts 11/13/2012  . Bursitis of hip   . Chronic back pain   . Depression   . Family history of adverse reaction to anesthesia    patient states father had a reaction to anesthesia about 20 years ago where his face and throat swelled, had to be reintubated."  . Family history of celiac disease 07/28/2018  . Family history of rheumatoid arthritis 08/27/2018   Maternal uncle  . Fibromyalgia   . Flat foot 08/27/2018   Status post bilateral foot reconstruction surgery and Achilles tendon lengthening in childhood.  . Generalized headaches   . GERD (gastroesophageal reflux disease)   . H/O laparoscopic adjustable  gastric banding 07/29/2013   10/17/10   . History of removal of laparoscopic gastric banding device 02/03/2018  . Irregular menses   . Joint pain   . Lactose intolerance   . Lower extremity edema   . Migraines   . Muscle pain   . Obesity (BMI 30-39.9)   . Osteoarthritis   . Other allergic rhinitis 05/24/2020  . Ovarian retention cyst 11/13/2012  . Sleep apnea   . SOB (shortness of breath)   . Stomach ulcer   . Swallowing difficulty   . Vision problems   . Vitamin D deficiency     Current Meds  Medication Sig    . albuterol (PROAIR HFA) 108 (90 BASE) MCG/ACT inhaler Inhale 2 puffs into the lungs every 6 (six) hours as needed for wheezing.  . busPIRone (BUSPAR) 10 MG tablet Take 10 mg by mouth 2 (two) times daily.   . butalbital-acetaminophen-caffeine (FIORICET) 50-325-40 MG tablet Take 1 tablet by mouth every 6 (six) hours as needed for headache.  . DULoxetine (CYMBALTA) 60 MG capsule Take 1 capsule (60 mg total) by mouth 2 (two) times daily.  . [DISCONTINUED] butalbital-acetaminophen-caffeine (FIORICET) 50-325-40 MG tablet Take 1 tablet by mouth every 6 (six) hours as needed for headache.    ROS:  Review of Systems  Constitutional: Negative.   HENT: Negative.   Eyes: Negative.   Respiratory: Negative.   Cardiovascular: Negative.   Gastrointestinal: Negative.   Genitourinary: Negative.   Musculoskeletal: Negative.   Skin: Negative.   Neurological: Negative.   Endo/Heme/Allergies: Negative.   Psychiatric/Behavioral: Negative.      Objective:   Today's Vitals: BP 136/74 (BP Location: Left Arm, Patient Position: Sitting, Cuff Size: Normal)   Pulse 94   Temp 98.4 F (36.9 C) (Temporal)   Ht 5\' 5"  (1.651 m)   Wt (!) 307 lb (139.3 kg)   LMP 11/07/2012   SpO2 98%   BMI 51.09 kg/m  Vitals with BMI 09/08/2020 08/18/2020 07/27/2020  Height 5\' 5"  5\' 5"  5\' 5"   Weight 307 lbs 296 lbs 304 lbs  BMI 51.09 49.26 50.59  Systolic 136 116 07/29/2020  Diastolic 74 76 82  Pulse 94 68 72     Physical Exam Vitals and nursing note reviewed.  Constitutional:      Appearance: Normal appearance. She is well-developed and well-groomed. She is morbidly obese.  HENT:     Head: Normocephalic and atraumatic.     Right Ear: External ear normal.     Left Ear: External ear normal.     Mouth/Throat:     Comments: Mask in place  Eyes:     General:        Right eye: No discharge.        Left eye: No discharge.     Conjunctiva/sclera: Conjunctivae normal.  Cardiovascular:     Rate and Rhythm: Normal rate and  regular rhythm.     Pulses: Normal pulses.     Heart sounds: Normal heart sounds.  Pulmonary:     Effort: Pulmonary effort is normal.     Breath sounds: Normal breath sounds.  Musculoskeletal:        General: Normal range of motion.     Cervical back: Normal range of motion and neck supple.  Skin:    General: Skin is warm.  Neurological:     General: No focal deficit present.     Mental Status: She is alert and oriented to person, place, and time.  Psychiatric:  Attention and Perception: Attention normal.        Mood and Affect: Mood normal.        Speech: Speech normal.        Behavior: Behavior normal. Behavior is cooperative.        Thought Content: Thought content normal.        Cognition and Memory: Cognition normal.        Judgment: Judgment normal.     Comments: Good communication and eye contact      Assessment   1. Morbid obesity with BMI of 50.0-59.9, adult (HCC)   2. Migraine without aura and without status migrainosus, not intractable   3. Situational anxiety   4. Mild intermittent asthma without complication   5. Fibromyalgia   6. OSA on CPAP     Tests ordered No orders of the defined types were placed in this encounter.    Plan: Please see assessment and plan per problem list above.   Meds ordered this encounter  Medications  . propranolol (INDERAL) 20 MG tablet    Sig: Take 1 tablet (20 mg total) by mouth 3 (three) times daily as needed (anxiety).    Dispense:  30 tablet    Refill:  0    Order Specific Question:   Supervising Provider    Answer:   SIMPSON, MARGARET E [[id number]]  . butalbital-acetaminophen-caffeine (FIORICET) 50-325-40 MG tablet    Sig: Take 1 tablet by mouth every 6 (six) hours as needed for headache.    Dispense:  30 tablet    Refill:  0    Order Specific Question:   Supervising Provider    Answer:   Genia Harold    Patient to follow-up in Summer 2022 CPE  Note: This dictation was prepared with Dragon  dictation along with smaller phrase technology. Similar sounding words can be transcribed inadequately or may not be corrected upon review. Any transcriptional errors that result from this process are unintentional.      Freddy Finner, NP

## 2020-09-08 NOTE — Assessment & Plan Note (Signed)
Overall controlled is seen by asthma and allergy.  Does have coming up appointment.  Continue all medications and treatments as they have prescribed.

## 2020-09-08 NOTE — Assessment & Plan Note (Signed)
Use of propranolol as needed.  Denies having any SI or HI.  Refill provided today.  Also uses BuSpar regularly.

## 2020-09-08 NOTE — Assessment & Plan Note (Signed)
Obesity is linked to depression Deteriorated  [name] is educated about the importance of exercise daily to help with weight management. A minumum of 30 minutes daily is recommended. Additionally, importance of healthy food choices  with portion control discussed.  Wt Readings from Last 3 Encounters:  09/08/20 (!) 307 lb (139.3 kg)  08/18/20 296 lb (134.3 kg)  07/27/20 (!) 304 lb (137.9 kg)

## 2020-09-08 NOTE — Assessment & Plan Note (Signed)
Is followed by rheumatology at this time.  Continue their current plan of treatment.

## 2020-09-08 NOTE — Patient Instructions (Signed)
  HAPPY FALL!  I appreciate the opportunity to provide you with care for your health and wellness. Today we discussed: established care  Follow up: July/Aug for CPE -fasting appt   No labs or referrals today  Nice to meet you today! Have a great Holiday Season :)  Please continue to practice social distancing to keep you, your family, and our community safe.  If you must go out, please wear a mask and practice good handwashing.  It was a pleasure to see you and I look forward to continuing to work together on your health and well-being. Please do not hesitate to call the office if you need care or have questions about your care.  Have a wonderful day and week. With Gratitude, Tereasa Coop, DNP, AGNP-BC

## 2020-09-08 NOTE — Assessment & Plan Note (Signed)
Sleeps well with CPAP machine.

## 2020-09-08 NOTE — Assessment & Plan Note (Signed)
Does not appear to be well controlled with 10 migraines a month sometimes.  Does take Fioricet that helps a lot.  Consider referral to neurology for more prophylactic measures in the future.

## 2020-09-12 ENCOUNTER — Encounter (INDEPENDENT_AMBULATORY_CARE_PROVIDER_SITE_OTHER): Payer: Self-pay | Admitting: Family Medicine

## 2020-09-12 ENCOUNTER — Ambulatory Visit (INDEPENDENT_AMBULATORY_CARE_PROVIDER_SITE_OTHER): Payer: BC Managed Care – PPO | Admitting: Family Medicine

## 2020-09-12 ENCOUNTER — Other Ambulatory Visit: Payer: Self-pay

## 2020-09-12 VITALS — BP 111/73 | HR 69 | Temp 97.6°F | Ht 65.0 in | Wt 299.0 lb

## 2020-09-12 DIAGNOSIS — Z6841 Body Mass Index (BMI) 40.0 and over, adult: Secondary | ICD-10-CM

## 2020-09-12 DIAGNOSIS — E8881 Metabolic syndrome: Secondary | ICD-10-CM

## 2020-09-12 DIAGNOSIS — E538 Deficiency of other specified B group vitamins: Secondary | ICD-10-CM

## 2020-09-12 DIAGNOSIS — E559 Vitamin D deficiency, unspecified: Secondary | ICD-10-CM

## 2020-09-12 DIAGNOSIS — Z9189 Other specified personal risk factors, not elsewhere classified: Secondary | ICD-10-CM

## 2020-09-12 DIAGNOSIS — R7303 Prediabetes: Secondary | ICD-10-CM | POA: Diagnosis not present

## 2020-09-12 DIAGNOSIS — F32A Depression, unspecified: Secondary | ICD-10-CM

## 2020-09-12 DIAGNOSIS — F419 Anxiety disorder, unspecified: Secondary | ICD-10-CM

## 2020-09-12 NOTE — Progress Notes (Signed)
Chief Complaint:   OBESITY [name] is here to discuss her progress with her obesity treatment plan along with follow-up of her obesity related diagnoses.   Today's visit was #: 6 Starting weight: 306 lbs Starting date: 05/19/2020 Today's weight: 299 lbs Today's date: 09/12/2020 Total lbs lost to date: 7 lbs Body mass index is 49.76 kg/m.  Total weight loss percentage to date: -2.29%  Interim History: [name] had a celebration since her last visit.  Her son got engaged!  Her daughter made gluten-free pumpkin bars.  She says she also had some popcorn at the movie theater.  She says she has been having more back/hip pain and has been taking gabapentin and pain medication.  She is still waiting for job information as well, which is causing increased stress.  Nutrition Plan: keeping a food journal and adhering to recommended goals of 1500 calories and 100 grams of protein.  Anti-obesity medications: None. Activity: Walking for 10 minutes 4 times per week. Stress: Elevated.  Current Outpatient Medications:  .  albuterol (PROAIR HFA) 108 (90 BASE) MCG/ACT inhaler, Inhale 2 puffs into the lungs every 6 (six) hours as needed for wheezing., Disp: , Rfl:  .  busPIRone (BUSPAR) 10 MG tablet, Take 10 mg by mouth 2 (two) times daily. , Disp: , Rfl:  .  butalbital-acetaminophen-caffeine (FIORICET) 50-325-40 MG tablet, Take 1 tablet by mouth every 6 (six) hours as needed for headache., Disp: 30 tablet, Rfl: 0 .  DULoxetine (CYMBALTA) 60 MG capsule, Take 1 capsule (60 mg total) by mouth 2 (two) times daily., Disp: 180 capsule, Rfl: 3 .  EPINEPHrine (EPIPEN) 0.3 mg/0.3 mL DEVI, Inject 0.3 mLs (0.3 mg total) into the muscle once., Disp: 1 Device, Rfl: 0 .  gabapentin (NEURONTIN) 300 MG capsule, Take 1 capsule (300 mg total) by mouth 3 (three) times daily., Disp: 90 capsule, Rfl: 0 .  loratadine (CLARITIN) 10 MG tablet, Take 10 mg by mouth daily., Disp: , Rfl:  .  propranolol (INDERAL) 20 MG tablet,  Take 1 tablet (20 mg total) by mouth 3 (three) times daily as needed (anxiety)., Disp: 30 tablet, Rfl: 0 .  Vitamin D, Ergocalciferol, (DRISDOL) 1.25 MG (50000 UNIT) CAPS capsule, Take 1 capsule (50,000 Units total) by mouth every 7 (seven) days., Disp: 4 capsule, Rfl: 0  Assessment/Plan:   1. Vitamin D deficiency Not at goal. Current vitamin D is 18.3, tested on 05/19/2020. Optimal goal > 50 ng/dL.   Plan:  [x]   Continue Vitamin D @50 ,000 IU every week. []   Continue home supplement daily. [x]   Follow-up for routine testing of Vitamin D at least 2-3 times per year to avoid over-replacement.  - VITAMIN D 25 Hydroxy (Vit-D Deficiency, Fractures)  2. Prediabetes At goal. Goal is HgbA1c < 5.7.  Medication: None.  She will continue to focus on protein-rich, low simple carbohydrate foods. We reviewed the importance of hydration, regular exercise for stress reduction, and restorative sleep.   Lab Results  Component Value Date   HGBA1C 5.9 (H) 09/12/2020   Lab Results  Component Value Date   INSULIN 40.6 (H) 09/12/2020   - Comprehensive metabolic panel - Insulin, random - Hemoglobin A1c  3. B12 deficiency At goal. We will continue to monitor symptoms as they relate to her weight loss journey.  Lab Results  Component Value Date   VITAMINB12 855 09/12/2020   - Anemia panel - CBC with Differential/Platelet  4. Metabolic syndrome Visceral fat rating is 19. Starting goal: Lose 7-10%  of starting weight. She will continue to focus on protein-rich, low simple carbohydrate foods. We reviewed the importance of hydration, regular exercise for stress reduction, and restorative sleep.  We will continue to check lab work every 3 months, with 10% weight loss, or should any other concerns arise.  5. Anxiety, with emotional eating Salia is taking propranolol 20 mg three times daily to help with anxiety.  Improving, but not optimized. Discussed cues and consequences, how thoughts affect eating,  model of thoughts, feelings, and behaviors, and strategies for change by focusing on the cue. Discussed cognitive distortions, coping thoughts, and how to change your thoughts.  6. At risk for heart disease [name] was given approximately 15 minutes of coronary artery disease prevention counseling today. She is 44 y.o. female and has risk factors for heart disease including obesity and metabolic syndrome. We reviewed intensive lifestyle modifications today with an emphasis on specific weight loss instructions and strategies.   7. Class 3 severe obesity with serious comorbidity and body mass index (BMI) of 45.0 to 49.9 in adult, unspecified obesity type (HCC)  Course: [name] is currently in the action stage of change. As such, her goal is to continue with weight loss efforts.   Nutrition goals: She has agreed to keeping a food journal and adhering to recommended goals of 1500 calories and 100 grams of protein.   Exercise goals: For substantial health benefits, adults should do at least 150 minutes (2 hours and 30 minutes) a week of moderate-intensity, or 75 minutes (1 hour and 15 minutes) a week of vigorous-intensity aerobic physical activity, or an equivalent combination of moderate- and vigorous-intensity aerobic activity. Aerobic activity should be performed in episodes of at least 10 minutes, and preferably, it should be spread throughout the week.  Behavioral modification strategies: increasing lean protein intake, decreasing simple carbohydrates, increasing vegetables, increasing water intake and emotional eating strategies.  [name] has agreed to follow-up with our clinic in 2-3 weeks. She was informed of the importance of frequent follow-up visits to maximize her success with intensive lifestyle modifications for her multiple health conditions.   Objective:   Blood pressure 111/73, pulse 69, temperature 97.6 F (36.4 C), height 5\' 5"  (1.651 m), weight 299 lb (135.6 kg), last menstrual  period 11/07/2012, SpO2 99 %. Body mass index is 49.76 kg/m.  General: Cooperative, alert, well developed, in no acute distress. HEENT: Conjunctivae and lids unremarkable. Cardiovascular: Regular rhythm.  Lungs: Normal work of breathing. Neurologic: No focal deficits.   Lab Results  Component Value Date   CREATININE 1.01 (H) 09/12/2020   BUN 11 09/12/2020   NA 138 09/12/2020   K 4.4 09/12/2020   CL 100 09/12/2020   CO2 22 09/12/2020   Lab Results  Component Value Date   ALT 25 09/12/2020   AST 20 09/12/2020   ALKPHOS 111 09/12/2020   BILITOT 0.3 09/12/2020   Lab Results  Component Value Date   HGBA1C 5.9 (H) 09/12/2020   HGBA1C 5.7 (H) 05/13/2020   HGBA1C 5.4 02/25/2013   Lab Results  Component Value Date   INSULIN 40.6 (H) 09/12/2020   Lab Results  Component Value Date   TSH 1.750 05/13/2020   Lab Results  Component Value Date   CHOL 248 (H) 09/12/2020   HDL 38 (L) 09/12/2020   LDLCALC 166 (H) 09/12/2020   TRIG 237 (H) 09/12/2020   CHOLHDL 6.5 (H) 09/12/2020   Lab Results  Component Value Date   WBC 7.1 09/12/2020   HGB 14.2 09/12/2020  HCT 43.1 09/12/2020   MCV 92 09/12/2020   PLT 296 09/12/2020   Lab Results  Component Value Date   IRON 83 09/12/2020   TIBC 293 09/12/2020   FERRITIN 119 09/12/2020   Attestation Statements:   Reviewed by clinician on day of visit: allergies, medications, problem list, medical history, surgical history, family history, social history, and previous encounter notes.  I, Insurance claims handler, CMA, am acting as transcriptionist for Helane Rima, DO  I have reviewed the above documentation for accuracy and completeness, and I agree with the above. Helane Rima, DO

## 2020-09-13 ENCOUNTER — Telehealth: Payer: BC Managed Care – PPO | Admitting: Family Medicine

## 2020-09-13 LAB — COMPREHENSIVE METABOLIC PANEL
ALT: 25 IU/L (ref 0–32)
AST: 20 IU/L (ref 0–40)
Albumin/Globulin Ratio: 1.7 (ref 1.2–2.2)
Albumin: 4.2 g/dL (ref 3.8–4.8)
Alkaline Phosphatase: 111 IU/L (ref 44–121)
BUN/Creatinine Ratio: 11 (ref 9–23)
BUN: 11 mg/dL (ref 6–24)
Bilirubin Total: 0.3 mg/dL (ref 0.0–1.2)
CO2: 22 mmol/L (ref 20–29)
Calcium: 9.1 mg/dL (ref 8.7–10.2)
Chloride: 100 mmol/L (ref 96–106)
Creatinine, Ser: 1.01 mg/dL — ABNORMAL HIGH (ref 0.57–1.00)
GFR calc Af Amer: 77 mL/min/{1.73_m2} (ref >59)
GFR calc non Af Amer: 67 mL/min/{1.73_m2} (ref >59)
Globulin, Total: 2.5 g/dL (ref 1.5–4.5)
Glucose: 103 mg/dL — ABNORMAL HIGH (ref 65–99)
Potassium: 4.4 mmol/L (ref 3.5–5.2)
Sodium: 138 mmol/L (ref 134–144)
Total Protein: 6.7 g/dL (ref 6.0–8.5)

## 2020-09-13 LAB — CBC WITH DIFFERENTIAL/PLATELET
Basophils Absolute: 0.1 10*3/uL (ref 0.0–0.2)
Basos: 1 %
EOS (ABSOLUTE): 0.4 10*3/uL (ref 0.0–0.4)
Eos: 6 %
Hemoglobin: 14.2 g/dL (ref 11.1–15.9)
Immature Grans (Abs): 0 10*3/uL (ref 0.0–0.1)
Immature Granulocytes: 0 %
Lymphocytes Absolute: 2 10*3/uL (ref 0.7–3.1)
Lymphs: 28 %
MCH: 30.4 pg (ref 26.6–33.0)
MCHC: 32.9 g/dL (ref 31.5–35.7)
MCV: 92 fL (ref 79–97)
Monocytes Absolute: 0.5 10*3/uL (ref 0.1–0.9)
Monocytes: 7 %
Neutrophils Absolute: 4.1 10*3/uL (ref 1.4–7.0)
Neutrophils: 58 %
Platelets: 296 10*3/uL (ref 150–450)
RBC: 4.67 x10E6/uL (ref 3.77–5.28)
RDW: 13.1 % (ref 11.7–15.4)
WBC: 7.1 10*3/uL (ref 3.4–10.8)

## 2020-09-13 LAB — ANEMIA PANEL
Ferritin: 119 ng/mL (ref 15–150)
Folate, Hemolysate: 467 ng/mL
Folate, RBC: 1084 ng/mL (ref >498)
Hematocrit: 43.1 % (ref 34.0–46.6)
Iron Saturation: 28 % (ref 15–55)
Iron: 83 ug/dL (ref 27–159)
Retic Ct Pct: 2.3 % (ref 0.6–2.6)
Total Iron Binding Capacity: 293 ug/dL (ref 250–450)
UIBC: 210 ug/dL (ref 131–425)
Vitamin B-12: 855 pg/mL (ref 232–1245)

## 2020-09-13 LAB — LIPID PANEL
Chol/HDL Ratio: 6.5 ratio — ABNORMAL HIGH (ref 0.0–4.4)
Cholesterol, Total: 248 mg/dL — ABNORMAL HIGH (ref 100–199)
HDL: 38 mg/dL — ABNORMAL LOW (ref >39)
LDL Chol Calc (NIH): 166 mg/dL — ABNORMAL HIGH (ref 0–99)
Triglycerides: 237 mg/dL — ABNORMAL HIGH (ref 0–149)
VLDL Cholesterol Cal: 44 mg/dL — ABNORMAL HIGH (ref 5–40)

## 2020-09-13 LAB — HEMOGLOBIN A1C
Est. average glucose Bld gHb Est-mCnc: 123 mg/dL
Hgb A1c MFr Bld: 5.9 % — ABNORMAL HIGH (ref 4.8–5.6)

## 2020-09-13 LAB — VITAMIN D 25 HYDROXY (VIT D DEFICIENCY, FRACTURES): Vit D, 25-Hydroxy: 24.4 ng/mL — ABNORMAL LOW (ref 30.0–100.0)

## 2020-09-13 LAB — INSULIN, RANDOM: INSULIN: 40.6 u[IU]/mL — ABNORMAL HIGH (ref 2.6–24.9)

## 2020-09-15 NOTE — Telephone Encounter (Signed)
Please review

## 2020-10-06 ENCOUNTER — Ambulatory Visit (INDEPENDENT_AMBULATORY_CARE_PROVIDER_SITE_OTHER): Payer: BC Managed Care – PPO | Admitting: Family Medicine

## 2020-10-06 ENCOUNTER — Other Ambulatory Visit: Payer: Self-pay

## 2020-10-06 ENCOUNTER — Encounter (INDEPENDENT_AMBULATORY_CARE_PROVIDER_SITE_OTHER): Payer: Self-pay | Admitting: Family Medicine

## 2020-10-06 VITALS — BP 126/79 | HR 72 | Temp 97.6°F | Ht 65.0 in | Wt 301.0 lb

## 2020-10-06 DIAGNOSIS — G8929 Other chronic pain: Secondary | ICD-10-CM

## 2020-10-06 DIAGNOSIS — M25561 Pain in right knee: Secondary | ICD-10-CM | POA: Diagnosis not present

## 2020-10-06 DIAGNOSIS — E782 Mixed hyperlipidemia: Secondary | ICD-10-CM | POA: Insufficient documentation

## 2020-10-06 DIAGNOSIS — M545 Low back pain, unspecified: Secondary | ICD-10-CM | POA: Diagnosis not present

## 2020-10-06 DIAGNOSIS — E8881 Metabolic syndrome: Secondary | ICD-10-CM

## 2020-10-06 DIAGNOSIS — F419 Anxiety disorder, unspecified: Secondary | ICD-10-CM | POA: Insufficient documentation

## 2020-10-06 DIAGNOSIS — Z9189 Other specified personal risk factors, not elsewhere classified: Secondary | ICD-10-CM | POA: Diagnosis not present

## 2020-10-06 DIAGNOSIS — M25552 Pain in left hip: Secondary | ICD-10-CM | POA: Diagnosis not present

## 2020-10-06 DIAGNOSIS — E1169 Type 2 diabetes mellitus with other specified complication: Secondary | ICD-10-CM | POA: Insufficient documentation

## 2020-10-06 DIAGNOSIS — Z6841 Body Mass Index (BMI) 40.0 and over, adult: Secondary | ICD-10-CM

## 2020-10-06 DIAGNOSIS — M79601 Pain in right arm: Secondary | ICD-10-CM

## 2020-10-06 DIAGNOSIS — E559 Vitamin D deficiency, unspecified: Secondary | ICD-10-CM | POA: Insufficient documentation

## 2020-10-06 DIAGNOSIS — E65 Localized adiposity: Secondary | ICD-10-CM

## 2020-10-06 DIAGNOSIS — R7303 Prediabetes: Secondary | ICD-10-CM | POA: Insufficient documentation

## 2020-10-06 NOTE — Progress Notes (Signed)
Chief Complaint:   OBESITY [name] is here to discuss her progress with her obesity treatment plan along with follow-up of her obesity related diagnoses.   Today's visit was #: 7 Starting weight: 306 lbs Starting date: 05/19/2020 Today's weight: 301 lbs Today's date: 10/06/2020 Total lbs lost to date: 5 lbs Body mass index is 50.09 kg/m.  Total weight loss percentage to date: -1.63%  Interim History: [name] says she is going to find therapy through [facility].  She says that she has been working on saying 'no' more often.  She is wearing a right tennis elbow brace, back brace, and right knee brace today. Admits to overdoing it to make her family happy.  Nutrition Plan: keeping a food journal and adhering to recommended goals of 1500 calories and 100 grams of protein 50% of the time.  Hunger is well controlled. Cravings are well controlled. Activity: [employer] M[employer]ain, Don't Gain with [name] at the [place].  Assessment/Plan:   1. Right knee pain, acute on chronic Will refer to Sports Medicine for evaluation and treatment.  - Ambulatory referral to Sports Medicine  2. Chronic bilateral low back pain without sciatica Acute flare, chronic issue. This issue directly impacts care plan for optimization of BMI and metabolic health as it impacts the patient's ability to make lifestyle changes.  - Ambulatory referral to Sports Medicine  3. Right arm pain [name] is currently wearing a tennis elbow splint.  - Ambulatory referral to Sports Medicine  4. Left hip pain Referral to Sports Medicine will be placed today.  - Ambulatory referral to Sports Medicine  5. Vitamin D deficiency Not at goal. Current vitamin D is 24.4, tested on 09/12/2020. Optimal goal > 50 ng/dL.   Plan:  [x]   Continue Vitamin D @50 ,000 IU every week. []   Continue home supplement daily. [x]   Follow-up for routine testing of Vitamin D at least 2-3 times per year to avoid over-replacement.  6.  Prediabetes Not at goal. Goal is HgbA1c < 5.7.  She will continue to focus on protein-rich, low simple carbohydrate foods. We reviewed the importance of hydration, regular exercise for stress reduction, and restorative sleep.   Plan:  Start Ozempic 0.25 mg subcutaneously weekly.  If denied by , will try Saxenda 3 mg subcutaneously daily with needles.  Lab Results  Component Value Date   HGBA1C 5.9 (H) 09/12/2020   Lab Results  Component Value Date   INSULIN 40.6 (H) 09/12/2020   7. Mixed hyperlipidemia Lipid-lowering medications: None.   Plan: Dietary changes: Increase soluble fiber. Decrease simple carbohydrates. Exercise changes: An average 40 minutes of moderate to vigorous-intensity aerobic activity 3 or 4 times per week.  Need to re-discuss treatment options.   Lab Results  Component Value Date   CHOL 248 (H) 09/12/2020   HDL 38 (L) 09/12/2020   LDLCALC 166 (H) 09/12/2020   TRIG 237 (H) 09/12/2020   CHOLHDL 6.5 (H) 09/12/2020   Lab Results  Component Value Date   ALT 25 09/12/2020   AST 20 09/12/2020   ALKPHOS 111 09/12/2020   BILITOT 0.3 09/12/2020   The 10-year ASCVD risk score 09/14/2020 DC Jr., et al., 2013) is: 2.8%   Values used to calculate the score:     Age: 44 years     Sex: Female     Is Non-Hispanic African American: No     Diabetic: No     Tobacco smoker: No     Systolic Blood Pressure: 126 mmHg  Is BP treated: Yes     HDL Cholesterol: 38 mg/dL     Total Cholesterol: 248 mg/dL  8. Visceral obesity Current visceral fat rating: 19. Visceral fat rating should be < 13. Visceral adipose tissue is a hormonally active component of total body fat. This body composition phenotype is associated with medical disorders such as metabolic syndrome, cardiovascular disease and several malignancies including prostate, breast, and colorectal cancers. Starting goal: Lose 7-10% of starting weight.   9. Metabolic syndrome Starting goal: Lose 7-10% of starting  weight. She will continue to focus on protein-rich, low simple carbohydrate foods. We reviewed the importance of hydration, regular exercise for stress reduction, and restorative sleep.  We will continue to check lab work every 3 months, with 10% weight loss, or should any other concerns arise.  10. Anxiety, with emotional eating [name] has been taking Cymbalta 60 mg daily, usually, but sometimes forgets.  She will try to take once daily consistently.  She says that CBD is helping more.  11. At risk for activity intolerance [name] was given approximately 15 minutes of exercise intolerance counseling today. She is 44 y.o. female and has risk factors exercise intolerance including obesity and orthopedic pain. We discussed intensive lifestyle modifications today with an emphasis on specific weight loss instructions and strategies. [name] will slowly increase activity as tolerated.  12. Class 3 severe obesity with serious comorbidity and body mass index (BMI) of 50.0 to 59.9 in adult, unspecified obesity type (HCC)  Course: [name] is currently in the action stage of change. As such, her goal is to continue with weight loss efforts.   Nutrition goals: She has agreed to keeping a food journal and adhering to recommended goals of 1500 calories and 100 grams of protein.   Exercise goals: [facility] to Sports Medicine.  Behavioral modification strategies: increasing lean protein intake, decreasing simple carbohydrates, increasing vegetables, dealing with family or coworker sabotage, travel eating strategies and holiday eating strategies .  [name] has agreed to follow-up with our clinic in 4 weeks. She was informed of the importance of frequent follow-up visits to maximize her success with intensive lifestyle modifications for her multiple health conditions.   Objective:   Blood pressure 126/79, pulse 72, temperature 97.6 F (36.4 C), temperature source Oral, height 5\' 5"  (1.651 m), weight (!) 301 lb  (136.5 kg), last menstrual period 11/07/2012, SpO2 98 %. Body mass index is 50.09 kg/m.  General: Cooperative, alert, well developed, in no acute distress. HEENT: Conjunctivae and lids unremarkable. Cardiovascular: Regular rhythm.  Lungs: Normal work of breathing. Neurologic: No focal deficits.   Lab Results  Component Value Date   CREATININE 1.01 (H) 09/12/2020   BUN 11 09/12/2020   NA 138 09/12/2020   K 4.4 09/12/2020   CL 100 09/12/2020   CO2 22 09/12/2020   Lab Results  Component Value Date   ALT 25 09/12/2020   AST 20 09/12/2020   ALKPHOS 111 09/12/2020   BILITOT 0.3 09/12/2020   Lab Results  Component Value Date   HGBA1C 5.9 (H) 09/12/2020   HGBA1C 5.7 (H) 05/13/2020   HGBA1C 5.4 02/25/2013   Lab Results  Component Value Date   INSULIN 40.6 (H) 09/12/2020   Lab Results  Component Value Date   TSH 1.750 05/13/2020   Lab Results  Component Value Date   CHOL 248 (H) 09/12/2020   HDL 38 (L) 09/12/2020   LDLCALC 166 (H) 09/12/2020   TRIG 237 (H) 09/12/2020   CHOLHDL 6.5 (H) 09/12/2020  Lab Results  Component Value Date   WBC 7.1 09/12/2020   HGB 14.2 09/12/2020   HCT 43.1 09/12/2020   MCV 92 09/12/2020   PLT 296 09/12/2020   Lab Results  Component Value Date   IRON 83 09/12/2020   TIBC 293 09/12/2020   FERRITIN 119 09/12/2020   Attestation Statements:   Reviewed by clinician on day of visit: allergies, medications, problem list, medical history, surgical history, family history, social history, and previous encounter notes.  I, Insurance claims handler, CMA, am acting as transcriptionist for Helane Rima, DO  I have reviewed the above documentation for accuracy and completeness, and I agree with the above. Helane Rima, DO

## 2020-10-08 ENCOUNTER — Encounter (INDEPENDENT_AMBULATORY_CARE_PROVIDER_SITE_OTHER): Payer: Self-pay | Admitting: Family Medicine

## 2020-10-08 ENCOUNTER — Encounter: Payer: Self-pay | Admitting: Family Medicine

## 2020-10-10 MED ORDER — OZEMPIC (0.25 OR 0.5 MG/DOSE) 2 MG/1.5ML ~~LOC~~ SOPN: 0.5000 mg | PEN_INJECTOR | SUBCUTANEOUS | 1 refills | Status: DC

## 2020-10-10 MED ORDER — VITAMIN D (ERGOCALCIFEROL) 1.25 MG (50000 UNIT) PO CAPS: 50000.0000 [IU] | ORAL_CAPSULE | ORAL | 0 refills | Status: AC

## 2020-10-11 NOTE — Telephone Encounter (Signed)
Please have the copies of your labs sent if they are not in the [facility] system so we have copies. Thank you

## 2020-10-13 ENCOUNTER — Other Ambulatory Visit: Payer: Self-pay

## 2020-10-13 DIAGNOSIS — F418 Other specified anxiety disorders: Secondary | ICD-10-CM

## 2020-10-13 MED ORDER — GABAPENTIN 300 MG PO CAPS: 300.0000 mg | ORAL_CAPSULE | Freq: Three times a day (TID) | ORAL | 0 refills | Status: DC

## 2020-10-13 MED ORDER — PROPRANOLOL HCL 20 MG PO TABS: 20.0000 mg | ORAL_TABLET | Freq: Three times a day (TID) | ORAL | 0 refills | Status: DC | PRN

## 2020-10-17 ENCOUNTER — Other Ambulatory Visit: Payer: Self-pay | Admitting: Family Medicine

## 2020-10-17 DIAGNOSIS — G43009 Migraine without aura, not intractable, without status migrainosus: Secondary | ICD-10-CM

## 2020-10-18 MED ORDER — BUTALBITAL-APAP-CAFFEINE 50-325-40 MG PO TABS
1.0000 | ORAL_TABLET | Freq: Four times a day (QID) | ORAL | 0 refills | Status: DC | PRN
Start: 2020-10-18 — End: 2021-02-22

## 2020-10-18 NOTE — Progress Notes (Signed)
Subjective:    I'm seeing this patient as a consultation for:  Dr. Earlene Plater. Note will be routed back to referring provider/PCP.  CC: R knee, Low back pain, R arm pain and L hip pain  I, [name], LAT, ATC, am serving as scribe for Dr. [name].  HPI: Pt is a 44 y/o female presenting w/ multiple c/o including R knee, low back pain, R arm/elbow pain and L hip pain.  Her main c/o is her R knee and L hip pain.  She had a lumbar fusion (L2-4) in 2020.  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 but notes increased pain over the last 6 months -R knee swelling: yes -R knee mechanical symptoms: no -Aggravating factors: weight-bearing activity; R knee flexion/rotation as when trying to cross her R ankle over her L knee; climbing stairs; transitioning from sit-to-stand -Treatments tried: accupuncture x 2-3 months; knee brace; ice; Gabapentin; Cymbalta; CBD topical; prior cortisone injection   L hip pain: pain worsening over the last 2 months -Radiating pain: No -Mechanical symptoms: yes -Aggravating factors: walking/weight-bearing activity -Treatments tried: water aerobics and treatments listed above for her R knee  Diagnostic imaging: B knee XR- 08/25/19; L-spine XR- 05/11/19  Past medical history, Surgical history, Family history, Social history, Allergies, and medications have been entered into the medical record, reviewed.   Review of Systems: No new headache, visual changes, nausea, vomiting, diarrhea, constipation, dizziness, abdominal pain, skin rash, fevers, chills, night sweats, weight loss, swollen lymph nodes, body aches, joint swelling, muscle aches, chest pain, shortness of breath, mood changes, visual or auditory hallucinations.   Objective:    Vitals:   10/19/20 1021  BP: 120/80  Pulse: 64  SpO2: 96%   General: Well Developed, well nourished, and in no acute distress.  Neuro/Psych: Alert and oriented x3, extra-ocular muscles intact, able to move all 4 extremities,  sensation grossly intact. Skin: Warm and dry, no rashes noted.  Respiratory: Not using accessory muscles, speaking in full sentences, trachea midline.  Cardiovascular: Pulses palpable, no extremity edema. Abdomen: Does not appear distended. MSK: Right knee: Wearing knee brace.  No effusion or erythema. Normal motion with mild crepitation. Tender palpation medial joint line. Stable given his exam. Intact strength.  Left hip normal-appearing Normal motion. Tender palpation lateral aspect of hip greater trochanter. Slight reduced strength resisted hip abduction strength testing.  Mild antalgic gait.  Lab and Radiology Results  X-ray images right knee and left hip obtained today personally and independently interpreted  Right knee: Mild medial and patellofemoral DJD.  Left hip: Mild DJD.  No fractures.  Await formal radiology review  Procedure: Real-time Ultrasound Guided Injection of right knee superior lateral patellar space Device: Philips Affiniti 50G Images permanently stored and available for review in PACS Ultrasound examination of knee prior to injection reveals no significant joint effusion.  Mild narrowing of medial joint line with partially diminished medial meniscus.  No Baker's cyst. Verbal informed consent obtained.  Discussed risks and benefits of procedure. Warned about infection bleeding damage to structures skin hypopigmentation and fat atrophy among others. Patient expresses understanding and agreement Time-out conducted.   Noted no overlying erythema, induration, or other signs of local infection.   Skin prepped in a sterile fashion.   Local anesthesia: Topical Ethyl chloride.   With sterile technique and under real time ultrasound guidance:  40 mg of Kenalog and 2 mL of Marcaine injected into joint capsule. Fluid seen entering the joint capsule.   Completed  without difficulty   Pain immediately resolved suggesting accurate placement of the medication.    Advised to call if fevers/chills, erythema, induration, drainage, or persistent bleeding.   Images permanently stored and available for review in the ultrasound unit.  Impression: Technically successful ultrasound guided injection.       Impression and Recommendations:    Assessment and Plan: 44 y.o. female with right knee pain and left hip pain.  Right knee pain due to DJD.  Discussed management strategies including repeat steroid injection.  Also recommend quad strengthening exercises.  Plan to refer to physical therapy as this will help as well.  Weight loss that she is already embarking on will help quite a bit as well.  Recommend continue knee brace if that is helpful as well as Voltaren gel.  Check back in 6 weeks or so.  Left hip pain due to greater trochanteric bursitis.  Plan to treat with physical therapy working on strengthening.  Consider steroid injection in the future if not improved.  Lesser issue placed in the back burner today of lateral epicondylitis doing well with elbow brace.  PDMP not reviewed this encounter. Orders Placed This Encounter  Procedures  . DG Knee AP/LAT W/Sunrise Right    Standing Status:   Future    Number of Occurrences:   1    Standing Expiration Date:   11/19/2020    Order Specific Question:   Reason for Exam (SYMPTOM  OR DIAGNOSIS REQUIRED)    Answer:   R knee pain    Order Specific Question:   Is patient pregnant?    Answer:   No    Order Specific Question:   Preferred imaging location?    Answer:   [name]  . DG HIP UNILAT W OR W/O PELVIS 2-3 VIEWS LEFT    Standing Status:   Future    Number of Occurrences:   1    Standing Expiration Date:   11/19/2020    Order Specific Question:   Reason for Exam (SYMPTOM  OR DIAGNOSIS REQUIRED)    Answer:   L hip pain    Order Specific Question:   Is patient pregnant?    Answer:   No    Order Specific Question:   Preferred imaging location?    Answer:   [name]  . [place]  LIMITED JOINT SPACE STRUCTURES LOW BILAT(NO LINKED CHARGES)    Order Specific Question:   Reason for Exam (SYMPTOM  OR DIAGNOSIS REQUIRED)    Answer:   R knee pain    Order Specific Question:   Preferred imaging location?    Answer:   Adult nurse Sports [facility]  . Ambulatory referral to Physical Therapy    Referral Priority:   Routine    Referral Type:   Physical Medicine    Referral Reason:   Specialty Services Required    Requested Specialty:   Physical Therapy   No orders of the defined types were placed in this encounter.   Discussed warning signs or symptoms. Please see discharge instructions. Patient expresses understanding.   The above documentation has been reviewed and is accurate and complete [name], M.D.

## 2020-10-19 ENCOUNTER — Encounter: Payer: Self-pay | Admitting: Family Medicine

## 2020-10-19 ENCOUNTER — Other Ambulatory Visit: Payer: Self-pay

## 2020-10-19 ENCOUNTER — Ambulatory Visit: Payer: Self-pay

## 2020-10-19 ENCOUNTER — Ambulatory Visit: Payer: BC Managed Care – PPO | Admitting: Family Medicine

## 2020-10-19 ENCOUNTER — Ambulatory Visit (INDEPENDENT_AMBULATORY_CARE_PROVIDER_SITE_OTHER): Payer: BC Managed Care – PPO

## 2020-10-19 VITALS — BP 120/80 | HR 64 | Ht 65.0 in | Wt 305.6 lb

## 2020-10-19 DIAGNOSIS — M25552 Pain in left hip: Secondary | ICD-10-CM | POA: Diagnosis not present

## 2020-10-19 DIAGNOSIS — M25561 Pain in right knee: Secondary | ICD-10-CM

## 2020-10-19 DIAGNOSIS — G8929 Other chronic pain: Secondary | ICD-10-CM | POA: Diagnosis not present

## 2020-10-19 NOTE — Patient Instructions (Signed)
Thank you for coming in today.  I've referred you to Physical Therapy.  Let us know if you don't hear from them in one week.  Recheck in 6 weeks.  Return sooner if needed.   Let me know if things are not going well.   Call or go to the ER if you develop a large red swollen joint with extreme pain or oozing puss.

## 2020-10-20 NOTE — Progress Notes (Signed)
X-ray right knee shows no acute problems.  It does show some arthritis.

## 2020-10-20 NOTE — Progress Notes (Signed)
X-ray right hip looks normal to radiology.  We can see evidence of prior lumbar fusion.

## 2020-10-24 ENCOUNTER — Ambulatory Visit (HOSPITAL_COMMUNITY): Payer: BC Managed Care – PPO

## 2020-10-24 ENCOUNTER — Encounter (HOSPITAL_COMMUNITY): Payer: Self-pay

## 2020-10-24 ENCOUNTER — Other Ambulatory Visit: Payer: Self-pay

## 2020-10-24 NOTE — Therapy (Signed)
[facility] [address] [place], [place], [address] Phone: [phone]   Fax:  [phone]  Patient Details  Name: [name] MRN: [id number] Date of Birth: [birth year] Referring Provider:  Rodolph Bong, MD  Encounter Date: 10/24/2020  Erroneous encounter, patient left without being seen   1:18 PM, 10/24/20 M. Shary Decamp, PT, DPT Physical Therapist- [place] Office Number: [phone]  [facility] [address] [place], [place], [address] Phone: [phone]   Fax:  [phone]

## 2020-11-04 ENCOUNTER — Encounter: Payer: Self-pay | Admitting: Family Medicine

## 2020-11-04 ENCOUNTER — Other Ambulatory Visit: Payer: Self-pay

## 2020-11-04 ENCOUNTER — Telehealth (INDEPENDENT_AMBULATORY_CARE_PROVIDER_SITE_OTHER): Payer: BC Managed Care – PPO | Admitting: Family Medicine

## 2020-11-04 DIAGNOSIS — Z20822 Contact with and (suspected) exposure to covid-19: Secondary | ICD-10-CM

## 2020-11-04 DIAGNOSIS — F419 Anxiety disorder, unspecified: Secondary | ICD-10-CM

## 2020-11-04 DIAGNOSIS — F32A Depression, unspecified: Secondary | ICD-10-CM | POA: Diagnosis not present

## 2020-11-04 DIAGNOSIS — J069 Acute upper respiratory infection, unspecified: Secondary | ICD-10-CM

## 2020-11-04 MED ORDER — BENZONATATE 100 MG PO CAPS: 100.0000 mg | ORAL_CAPSULE | Freq: Three times a day (TID) | ORAL | 0 refills | Status: DC | PRN

## 2020-11-04 MED ORDER — BUSPIRONE HCL 10 MG PO TABS: 10.0000 mg | ORAL_TABLET | Freq: Three times a day (TID) | ORAL | 1 refills | Status: DC

## 2020-11-04 NOTE — Progress Notes (Signed)
Virtual Visit via Telephone Note   This visit type was conducted due to national recommendations for restrictions regarding the COVID-19 Pandemic (e.g. social distancing) in an effort to limit this patient's exposure and mitigate transmission in our community.  Due to her co-morbid illnesses, this patient is at least at moderate risk for complications without adequate follow up.  This format is felt to be most appropriate for this patient at this time.  The patient did not have access to video technology/had technical difficulties with video requiring transitioning to audio format only (telephone).  All issues noted in this document were discussed and addressed.  No physical exam could be performed with this format.   Evaluation Performed:  Follow-up visit  Date:  11/04/2020   ID:  Pam, Nguyen 1976/06/27, MRN [id number]  Patient Location: Home Provider Location: Office/Clinic   Participants: Nurse for intake and work up; Patient and Provider for Visit and Wrap up  Method of visit: Telephone  Location of Patient: Home Location of Provider: Office Consent was obtain for visit over the telephone. Services rendered by provider: Visit was performed via telephone   I verified that I am speaking with the correct person using two identifiers.  PCP:  Freddy Finner, NP   Chief Complaint:  Start of cold like symptoms   History of Present Illness:    [name] is a 45 y.o. female with 24 -48 hour history of exposure to COVID + co worker in addition to sore throat, increased sinus drainage, body aches, and fatigue. Mild feverish and chill feeling. Limited cough, but can fele the drainage and reports mild shortness of breath she thinks is her asthma that might get flared up.  Denies chest pain.   COVID testing scheduled for this afternoon.  The patient does not have symptoms concerning for COVID-19 infection (fever, chills, cough, or new shortness of breath).   Past  Medical, Surgical, Social History, Allergies, and Medications have been Reviewed.  Past Medical History:  Diagnosis Date  . ADD (attention deficit disorder)   . Allergies   . Anemia   . Arthritis   . Asthma    EXERCISE INDUCED  . B12 deficiency   . Bilateral ovarian cysts 11/13/2012  . Bursitis of hip   . Chronic back pain   . Depression   . Family history of adverse reaction to anesthesia    patient states father had a reaction to anesthesia about 20 years ago where his face and throat swelled, had to be reintubated."  . Family history of celiac disease 07/28/2018  . Family history of rheumatoid arthritis 08/27/2018   Maternal uncle  . Fibromyalgia   . Flat foot 08/27/2018   Status post bilateral foot reconstruction surgery and Achilles tendon lengthening in childhood.  . Generalized headaches   . GERD (gastroesophageal reflux disease)   . H/O laparoscopic adjustable gastric banding 07/29/2013   10/17/10   . History of removal of laparoscopic gastric banding device 02/03/2018  . Irregular menses   . Joint pain   . Lactose intolerance   . Lower extremity edema   . Migraines   . Muscle pain   . Obesity (BMI 30-39.9)   . Osteoarthritis   . Other allergic rhinitis 05/24/2020  . Ovarian retention cyst 11/13/2012  . Nguyen apnea   . SOB (shortness of breath)   . Stomach ulcer   . Swallowing difficulty   . Vision problems   . Vitamin D deficiency  Past Surgical History:  Procedure Laterality Date  . ABDOMINAL HYSTERECTOMY    . ACHILLES TENDON LENGTHENING  1989  . ANKLE GANGLION CYST EXCISION  02/2012   left ankle  . ANTERIOR LAT LUMBAR FUSION Right 05/11/2019   Procedure: ANTERIOR LATERAL LUMBAR INTERBODY FUSION, LATERAL INSTRUMENTATION, RIGHT LUMBAR TWO- LUMBAR THREE;  Surgeon: Tressie Stalker, MD;  [facility] OR;  Service: Neurosurgery;  Laterality: Right;  ANTERIOR LATERAL LUMBAR INTERBODY FUSION, LATERAL INSTRUMENTATION, RIGHT LUMBAR TWO- LUMBAR THREE  . BIOPSY   03/16/2019   Procedure: BIOPSY;  Surgeon: Corbin Ade, MD;  Location: [facility] SUITE;  Service: Endoscopy;;  Gastric   . COLONOSCOPY WITH PROPOFOL N/A 03/16/2019   Dr. Jena Gauss: Normal terminal ileum, 15 cm.  Normal colon.  Next colonoscopy 10 years  . DILATION AND CURETTAGE OF UTERUS  2003  . ENTEROSCOPY N/A 04/02/2019   Dr. Jena Gauss: bulbar erosions, jejunal ulcerations, attempted biopsy but scope fell backwards and lesions cannot be reidentified.  No evidence of ampullary lesion or mass.  . ESOPHAGOGASTRODUODENOSCOPY (EGD) WITH PROPOFOL N/A 03/16/2019   Dr. Jena Gauss: Large hiatal hernia, Cameron/antral erosions, duodenal erosions.  Gastric biopsy showed reactive gastropathy, mild chronic gastritis, negative for H. pylori  . FEET SURGERY  1990   INSERTION OF BONE GRAFT IN FEET  . GIVENS CAPSULE STUDY N/A 03/16/2019   Procedure: GIVENS CAPSULE STUDY;  Surgeon: Corbin Ade, MD;  Location: [facility];  Service: Endoscopy;  Laterality: N/A;  . LAPAROSCOPIC GASTRIC BANDING  10/17/10   Dr Gaynelle Adu; [facility]  . LAPAROSCOPIC REPAIR AND REMOVAL OF GASTRIC BAND  01/2018  . LIPOMA EXCISION  02/2006   abdomen     Current Meds  Medication Sig  . albuterol (VENTOLIN HFA) 108 (90 Base) MCG/ACT inhaler Inhale 2 puffs into the lungs every 6 (six) hours as needed for wheezing.  . busPIRone (BUSPAR) 10 MG tablet Take 10 mg by mouth 2 (two) times daily.   . butalbital-acetaminophen-caffeine (FIORICET) 50-325-40 MG tablet Take 1 tablet by mouth every 6 (six) hours as needed for headache.  . DULoxetine (CYMBALTA) 60 MG capsule Take 1 capsule (60 mg total) by mouth 2 (two) times daily.  Marland Kitchen EPINEPHrine (EPIPEN) 0.3 mg/0.3 mL DEVI Inject 0.3 mLs (0.3 mg total) into the muscle once.  . gabapentin (NEURONTIN) 300 MG capsule Take 1 capsule (300 mg total) by mouth 3 (three) times daily.  Marland Kitchen loratadine (CLARITIN) 10 MG tablet Take 10 mg by mouth daily.  . propranolol (INDERAL) 20 MG tablet Take 1 tablet (20 mg  total) by mouth 3 (three) times daily as needed (anxiety).  . Semaglutide,0.25 or 0.5MG /DOS, (OZEMPIC, 0.25 OR 0.5 MG/DOSE,) 2 MG/1.5ML SOPN Inject 0.5 mg into the skin once a week for 8 doses.  . Vitamin D, Ergocalciferol, (DRISDOL) 1.25 MG (50000 UNIT) CAPS capsule Take 1 capsule (50,000 Units total) by mouth 2 (two) times a week.     Allergies:   Cashew nut oil, Cauliflower [brassica oleracea], Whey, Augmentin [amoxicillin-pot clavulanate], Orange fruit [citrus], Gluten meal, Milk-related compounds, Other, Codeine, and Tree extract   ROS:   Please see the history of present illness.    All other systems reviewed and are negative.   Labs/Other Tests and Data Reviewed:    Recent Labs: 05/13/2020: TSH 1.750 09/12/2020: ALT 25; BUN 11; Creatinine, Ser 1.01; Hemoglobin 14.2; Platelets 296; Potassium 4.4; Sodium 138   Recent Lipid Panel Lab Results  Component Value Date/Time   CHOL 248 (H) 09/12/2020 07:40 AM  TRIG 237 (H) 09/12/2020 07:40 AM   HDL 38 (L) 09/12/2020 07:40 AM   CHOLHDL 6.5 (H) 09/12/2020 07:40 AM   CHOLHDL 5.3 CALC 12/20/2008 08:58 AM   LDLCALC 166 (H) 09/12/2020 07:40 AM    Wt Readings from Last 3 Encounters:  10/19/20 (!) 305 lb 9.6 oz (138.6 kg)  10/06/20 (!) 301 lb (136.5 kg)  09/12/20 299 lb (135.6 kg)     Objective:    Vital Signs:  LMP 11/07/2012    VITAL SIGNS:  reviewed GEN:  no acute distress RESPIRATORY:  no shortness of breath in conversation  PSYCH:  normal affect  ASSESSMENT & PLAN:    1. Anxiety and depression   2. Viral URI with cough  - benzonatate (TESSALON) 100 MG capsule; Take 1 capsule (100 mg total) by mouth 3 (three) times daily as needed for cough.  Dispense: 30 capsule; Refill: 0   Time:   Today, I have spent 10 minutes with the patient with telehealth technology discussing the above problems.     Medication Adjustments/Labs and Tests Ordered: Current medicines are reviewed at length with the patient today.  Concerns  regarding medicines are outlined above.   Tests Ordered: No orders of the defined types were placed in this encounter.   Medication Changes: No orders of the defined types were placed in this encounter.    Disposition:  Follow up 2 weeks for anxiety  Signed, Perlie Mayo, NP  11/04/2020 9:04 AM     [place]

## 2020-11-04 NOTE — Assessment & Plan Note (Signed)
COVID + exposure  COVID testing today  Tessalon perles provided, might need prednisone if worsen. Symptomatic OTC tx reviewed

## 2020-11-04 NOTE — Assessment & Plan Note (Signed)
Worse since reducing Cymbalta to 60 mg once daily Wants to stay on this, but anxiety has increased.  Increase Buspar to TID and f/u 2 weeks

## 2020-11-04 NOTE — Patient Instructions (Addendum)
  I appreciate the opportunity to provide you with care for your health and wellness.  Follow up: 2 weeks by phone for anxiety   No labs or referrals today  Increased in Buspar today  Cough medication sent in, call if asthma does flare up.  Hope you feel better soon!  Drink plenty of water and rest.  Please continue to practice social distancing to keep you, your family, and our community safe.  If you must go out, please wear a mask and practice good handwashing.  It was a pleasure to see you and I look forward to continuing to work together on your health and well-being. Please do not hesitate to call the office if you need care or have questions about your care.  Have a wonderful day. With Gratitude, Tereasa Coop, DNP, AGNP-BC

## 2020-11-08 ENCOUNTER — Telehealth (INDEPENDENT_AMBULATORY_CARE_PROVIDER_SITE_OTHER): Payer: BC Managed Care – PPO | Admitting: Family Medicine

## 2020-11-08 ENCOUNTER — Encounter (INDEPENDENT_AMBULATORY_CARE_PROVIDER_SITE_OTHER): Payer: Self-pay | Admitting: Family Medicine

## 2020-11-08 ENCOUNTER — Other Ambulatory Visit: Payer: Self-pay

## 2020-11-08 DIAGNOSIS — F419 Anxiety disorder, unspecified: Secondary | ICD-10-CM | POA: Diagnosis not present

## 2020-11-08 DIAGNOSIS — M25561 Pain in right knee: Secondary | ICD-10-CM

## 2020-11-08 DIAGNOSIS — Z6841 Body Mass Index (BMI) 40.0 and over, adult: Secondary | ICD-10-CM

## 2020-11-08 DIAGNOSIS — G8929 Other chronic pain: Secondary | ICD-10-CM

## 2020-11-08 DIAGNOSIS — R7303 Prediabetes: Secondary | ICD-10-CM

## 2020-11-08 LAB — NOVEL CORONAVIRUS, NAA: SARS-CoV-2, NAA: NOT DETECTED

## 2020-11-09 ENCOUNTER — Other Ambulatory Visit: Payer: Self-pay

## 2020-11-09 DIAGNOSIS — F418 Other specified anxiety disorders: Secondary | ICD-10-CM

## 2020-11-09 MED ORDER — PROPRANOLOL HCL 20 MG PO TABS: 20.0000 mg | ORAL_TABLET | Freq: Three times a day (TID) | ORAL | 0 refills | Status: DC | PRN

## 2020-11-10 NOTE — Progress Notes (Signed)
TeleHealth Visit:  Due to the COVID-19 pandemic, this visit was completed with telemedicine (audio/video) technology to reduce patient and provider exposure as well as to preserve personal protective equipment.   [name] has verbally consented to this TeleHealth visit. The patient is located at home, the provider is located at the [facility] office. The participants in this visit include the listed provider and patient. The visit was conducted today via MyChart video.  Chief Complaint: OBESITY [name] is here to discuss her progress with her obesity treatment plan along with follow-up of her obesity related diagnoses. [name] is on keeping a food journal and adhering to recommended goals of 1500 calories and 100 grams of protein and states she is following her eating plan approximately 50% of the time. [name] states she is swimming and doing water aerobics for 30-45 minutes 3-4 times per week.  Today's visit was #: 8 Starting weight: 306 lbs Starting date: 05/19/2020  Interim History: [name] was exposed to COVID and has symptoms.  PCR test was negative.  She says that her son will be getting married in October.  She reports that her knee pain is much improved after injection.  Assessment/Plan:   1. Chronic pain of right knee Improved with injection.  Followed by Sports Medicine. We will continue to monitor symptoms as they relate to her weight loss journey.  2. Prediabetes Improving, but not optimized. Goal is HgbA1c < 5.7.  Medication: Ozempic 0.5 mg subcutaneously weekly.  She will continue to focus on protein-rich, low simple carbohydrate foods. We reviewed the importance of hydration, regular exercise for stress reduction, and restorative sleep.   Lab Results  Component Value Date   HGBA1C 5.9 (H) 09/12/2020   Lab Results  Component Value Date   INSULIN 40.6 (H) 09/12/2020   3. Anxiety, with emotional eating Behavior modification techniques were discussed  today to help [name] deal with her anxiety.  Discussed cues and consequences, how thoughts affect eating, model of thoughts, feelings, and behaviors, and strategies for change by focusing on the cue. Discussed cognitive distortions, coping thoughts, and how to change your thoughts.  4. Class 3 severe obesity with serious comorbidity and body mass index (BMI) of 50.0 to 59.9 in adult, unspecified obesity type (HCC)  [name] is currently in the action stage of change. As such, her goal is to continue with weight loss efforts. She has agreed to keeping a food journal and adhering to recommended goals of 1500 calories and 100 grams of protein.   Exercise goals: As is.  Behavioral modification strategies: increasing lean protein intake, decreasing simple carbohydrates, increasing vegetables, increasing water intake and decreasing liquid calories.  [name] has agreed to follow-up with our clinic in 4 weeks. She was informed of the importance of frequent follow-up visits to maximize her success with intensive lifestyle modifications for her multiple health conditions.  Objective:   VITALS: Per patient if applicable, see vitals. GENERAL: Alert and in no acute distress. CARDIOPULMONARY: No increased WOB. Speaking in clear sentences.  PSYCH: Pleasant and cooperative. Speech normal rate and rhythm. Affect is appropriate. Insight and judgement are appropriate. Attention is focused, linear, and appropriate.  NEURO: Oriented as arrived to appointment on time with no prompting.   Lab Results  Component Value Date   CREATININE 1.01 (H) 09/12/2020   BUN 11 09/12/2020   NA 138 09/12/2020   K 4.4 09/12/2020   CL 100 09/12/2020   CO2 22 09/12/2020   Lab Results  Component Value  Date   ALT 25 09/12/2020   AST 20 09/12/2020   ALKPHOS 111 09/12/2020   BILITOT 0.3 09/12/2020   Lab Results  Component Value Date   HGBA1C 5.9 (H) 09/12/2020   HGBA1C 5.7 (H) 05/13/2020   HGBA1C 5.4 02/25/2013   Lab  Results  Component Value Date   INSULIN 40.6 (H) 09/12/2020   Lab Results  Component Value Date   TSH 1.750 05/13/2020   Lab Results  Component Value Date   CHOL 248 (H) 09/12/2020   HDL 38 (L) 09/12/2020   LDLCALC 166 (H) 09/12/2020   TRIG 237 (H) 09/12/2020   CHOLHDL 6.5 (H) 09/12/2020   Lab Results  Component Value Date   WBC 7.1 09/12/2020   HGB 14.2 09/12/2020   HCT 43.1 09/12/2020   MCV 92 09/12/2020   PLT 296 09/12/2020   Lab Results  Component Value Date   IRON 83 09/12/2020   TIBC 293 09/12/2020   FERRITIN 119 09/12/2020   Attestation Statements:   Reviewed by clinician on day of visit: allergies, medications, problem list, medical history, surgical history, family history, social history, and previous encounter notes.  I, Insurance claims handler, CMA, am acting as transcriptionist for Helane Rima, DO  I have reviewed the above documentation for accuracy and completeness, and I agree with the above. Helane Rima, DO

## 2020-11-11 ENCOUNTER — Encounter: Payer: Self-pay | Admitting: Family Medicine

## 2020-11-11 ENCOUNTER — Other Ambulatory Visit: Payer: Self-pay

## 2020-11-11 ENCOUNTER — Telehealth (INDEPENDENT_AMBULATORY_CARE_PROVIDER_SITE_OTHER): Payer: BC Managed Care – PPO | Admitting: Family Medicine

## 2020-11-11 VITALS — Ht 65.0 in | Wt 291.0 lb

## 2020-11-11 DIAGNOSIS — F32A Depression, unspecified: Secondary | ICD-10-CM | POA: Diagnosis not present

## 2020-11-11 DIAGNOSIS — F419 Anxiety disorder, unspecified: Secondary | ICD-10-CM

## 2020-11-11 NOTE — Assessment & Plan Note (Signed)
Continue BuSpar 3 times daily Increase Cymbalta if not improved in the next day since she was off her medicine for a while.  Follow-up as scheduled

## 2020-11-11 NOTE — Progress Notes (Signed)
Virtual Visit via Telephone Note   This visit type was conducted due to national recommendations for restrictions regarding the COVID-19 Pandemic (e.g. social distancing) in an effort to limit this patient's exposure and mitigate transmission in our community.  Due to her co-morbid illnesses, this patient is at least at moderate risk for complications without adequate follow up.  This format is felt to be most appropriate for this patient at this time.  The patient did not have access to video technology/had technical difficulties with video requiring transitioning to audio format only (telephone).  All issues noted in this document were discussed and addressed.  No physical exam could be performed with this format.    Evaluation Performed:  Follow-up visit  Date:  11/11/2020   ID:  [name], [name] [birth year], MRN [id number]  Patient Location: Home Provider Location: Home Office   Participants: Nurse/CMA for intake and work up; Patient and Provider for Visit and Wrap up  Method of visit: Telephone Location of Patient: Home Location of Provider: Office Consent was obtain for visit over the telephone. Services rendered by provider: Visit was performed via telephone  I verified that I am speaking with the correct person using two identifiers.  PCP:  Freddy Finner, NP   Chief Complaint: Anxiety follow-up  History of Present Illness:    [name] is a 45 y.o. female with history of stated below.  Seen about a week back.  Advised for her to increase her BuSpar to 3 times daily as she reduced her Cymbalta to 60 mg once daily instead of twice daily.  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  She is still only taking the Cymbalta 60 mg once daily.  She reports the BuSpar is very helpful.  However she was without her medicine 3 to 4  days secondary to having a cold and feeling sick on her stomach and not able to tolerate taking anything again.  She is just now getting restarted on all of her medicines and reports she has seen some improvement but has been very mild. Denies having any SI or HI.  The patient does not have symptoms concerning for COVID-19 infection (fever, chills, cough, or new shortness of breath).   Past Medical, Surgical, Social History, Allergies, and Medications have been Reviewed.  Past Medical History:  Diagnosis Date  . ADD (attention deficit disorder)   . Allergies   . Anemia   . Arthritis   . Asthma    EXERCISE INDUCED  . B12 deficiency   . Bilateral ovarian cysts 11/13/2012  . Bursitis of hip   . Chronic back pain   . Depression   . Family history of adverse reaction to anesthesia    patient states father had a reaction to anesthesia about 20 years ago where his face and throat swelled, had to be reintubated."  . Family history of celiac disease 07/28/2018  . Family history of rheumatoid arthritis 08/27/2018   Maternal uncle  . Fibromyalgia   . Flat foot 08/27/2018   Status post bilateral foot reconstruction surgery and Achilles tendon lengthening in childhood.  . Generalized headaches   . GERD (gastroesophageal reflux disease)   . H/O laparoscopic adjustable gastric banding 07/29/2013   10/17/10   . History of removal of laparoscopic gastric banding device 02/03/2018  . Irregular  menses   . Joint pain   . Lactose intolerance   . Lower extremity edema   . Migraines   . Muscle pain   . Obesity (BMI 30-39.9)   . Osteoarthritis   . Other allergic rhinitis 05/24/2020  . Ovarian retention cyst 11/13/2012  . Sleep apnea   . SOB (shortness of breath)   . Stomach ulcer   . Swallowing difficulty   . Vision problems   . Vitamin D deficiency    Past Surgical History:  Procedure Laterality Date  . ABDOMINAL HYSTERECTOMY    . ACHILLES TENDON LENGTHENING  1989  . ANKLE GANGLION CYST  EXCISION  02/2012   left ankle  . ANTERIOR LAT LUMBAR FUSION Right 05/11/2019   Procedure: ANTERIOR LATERAL LUMBAR INTERBODY FUSION, LATERAL INSTRUMENTATION, RIGHT LUMBAR TWO- LUMBAR THREE;  Surgeon: Tressie Stalker, MD;  Location: [facility];  Service: Neurosurgery;  Laterality: Right;  ANTERIOR LATERAL LUMBAR INTERBODY FUSION, LATERAL INSTRUMENTATION, RIGHT LUMBAR TWO- LUMBAR THREE  . BIOPSY  03/16/2019   Procedure: BIOPSY;  Surgeon: Corbin Ade, MD;  Location: [facility];  Service: Endoscopy;;  Gastric   . COLONOSCOPY WITH PROPOFOL N/A 03/16/2019   Dr. Jena Gauss: Normal terminal ileum, 15 cm.  Normal colon.  Next colonoscopy 10 years  . DILATION AND CURETTAGE OF UTERUS  2003  . ENTEROSCOPY N/A 04/02/2019   Dr. Jena Gauss: bulbar erosions, jejunal ulcerations, attempted biopsy but scope fell backwards and lesions cannot be reidentified.  No evidence of ampullary lesion or mass.  . ESOPHAGOGASTRODUODENOSCOPY (EGD) WITH PROPOFOL N/A 03/16/2019   Dr. Jena Gauss: Large hiatal hernia, Cameron/antral erosions, duodenal erosions.  Gastric biopsy showed reactive gastropathy, mild chronic gastritis, negative for H. pylori  . FEET SURGERY  1990   INSERTION OF BONE GRAFT IN FEET  . GIVENS CAPSULE STUDY N/A 03/16/2019   Procedure: GIVENS CAPSULE STUDY;  Surgeon: Corbin Ade, MD;  Location: [facility];  Service: Endoscopy;  Laterality: N/A;  . LAPAROSCOPIC GASTRIC BANDING  10/17/10   Dr Gaynelle Adu; [facility]  . LAPAROSCOPIC REPAIR AND REMOVAL OF GASTRIC BAND  01/2018  . LIPOMA EXCISION  02/2006   abdomen  . SPINE SURGERY N/A    Phreesia 11/10/2020     Current Meds  Medication Sig  . albuterol (VENTOLIN HFA) 108 (90 Base) MCG/ACT inhaler Inhale 2 puffs into the lungs every 6 (six) hours as needed for wheezing.  . benzonatate (TESSALON) 100 MG capsule Take 1 capsule (100 mg total) by mouth 3 (three) times daily as needed for cough.  . busPIRone (BUSPAR) 10 MG tablet Take 1 tablet (10 mg total) by mouth 3  (three) times daily.  . butalbital-acetaminophen-caffeine (FIORICET) 50-325-40 MG tablet Take 1 tablet by mouth every 6 (six) hours as needed for headache.  . DULoxetine (CYMBALTA) 60 MG capsule Take 1 capsule (60 mg total) by mouth 2 (two) times daily.  Marland Kitchen EPINEPHrine (EPIPEN) 0.3 mg/0.3 mL DEVI Inject 0.3 mLs (0.3 mg total) into the muscle once.  . gabapentin (NEURONTIN) 300 MG capsule Take 1 capsule (300 mg total) by mouth 3 (three) times daily.  Marland Kitchen loratadine (CLARITIN) 10 MG tablet Take 10 mg by mouth daily.  . propranolol (INDERAL) 20 MG tablet Take 1 tablet (20 mg total) by mouth 3 (three) times daily as needed (anxiety).  . Semaglutide,0.25 or 0.5MG /DOS, (OZEMPIC, 0.25 OR 0.5 MG/DOSE,) 2 MG/1.5ML SOPN Inject 0.5 mg into the skin once a week for 8 doses.  . Vitamin D, Ergocalciferol, (DRISDOL) 1.25 MG (50000  UNIT) CAPS capsule Take 1 capsule (50,000 Units total) by mouth 2 (two) times a week.     Allergies:   Cashew nut oil, Cauliflower [brassica oleracea], Whey, Augmentin [amoxicillin-pot clavulanate], Orange fruit [citrus], Wheat bran, Gluten meal, Milk-related compounds, Other, Codeine, and Tree extract   ROS:   Please see the history of present illness.    All other systems reviewed and are negative.   Labs/Other Tests and Data Reviewed:    Recent Labs: 05/13/2020: TSH 1.750 09/12/2020: ALT 25; BUN 11; Creatinine, Ser 1.01; Hemoglobin 14.2; Platelets 296; Potassium 4.4; Sodium 138   Recent Lipid Panel Lab Results  Component Value Date/Time   CHOL 248 (H) 09/12/2020 07:40 AM   TRIG 237 (H) 09/12/2020 07:40 AM   HDL 38 (L) 09/12/2020 07:40 AM   CHOLHDL 6.5 (H) 09/12/2020 07:40 AM   CHOLHDL 5.3 CALC 12/20/2008 08:58 AM   LDLCALC 166 (H) 09/12/2020 07:40 AM    Wt Readings from Last 3 Encounters:  11/11/20 291 lb (132 kg)  10/19/20 (!) 305 lb 9.6 oz (138.6 kg)  10/06/20 (!) 301 lb (136.5 kg)     Objective:    Vital Signs:  Ht 5\' 5"  (1.651 m)   Wt 291 lb (132 kg)   LMP  11/07/2012   BMI 48.42 kg/m    VITAL SIGNS:  reviewed GEN:  no acute distress RESPIRATORY:  No shortness of breath is noted in conversation PSYCH:  Anxious mood and affect  ASSESSMENT & PLAN:    1. Anxiety and depression Continue BuSpar 3 times daily Increase Cymbalta if not improved in the next day since she was off her medicine for a while.  Follow-up as scheduled.  Time:   Today, I have spent 7 minutes with the patient with telehealth technology discussing the above problems.     Medication Adjustments/Labs and Tests Ordered: Current medicines are reviewed at length with the patient today.  Concerns regarding medicines are outlined above.   Tests Ordered: No orders of the defined types were placed in this encounter.   Medication Changes: No orders of the defined types were placed in this encounter.    Disposition:  Follow up 05/03/2021 or as needed Signed, 07/04/2021, NP  11/11/2020 8:33 AM     11/13/2020 [facility]

## 2020-11-11 NOTE — Patient Instructions (Signed)
  I appreciate the opportunity to provide you with care for your health and wellness.  Follow up: as scheduled or as needed  No labs or referrals today  Continue medications as ordered If not better in 3 days increase dose of cymbalta back to 60 mg twice daily. Then we can wean back down.   Please continue to practice social distancing to keep you, your family, and our community safe.  If you must go out, please wear a mask and practice good handwashing.  It was a pleasure to see you and I look forward to continuing to work together on your health and well-being. Please do not hesitate to call the office if you need care or have questions about your care.  Have a wonderful day. With Gratitude, Tereasa Coop, DNP, AGNP-BC

## 2020-11-18 ENCOUNTER — Telehealth: Payer: Self-pay | Admitting: Family Medicine

## 2020-11-21 ENCOUNTER — Encounter: Payer: Self-pay | Admitting: Allergy & Immunology

## 2020-11-21 ENCOUNTER — Other Ambulatory Visit: Payer: Self-pay | Admitting: Family Medicine

## 2020-11-21 MED ORDER — GABAPENTIN 300 MG PO CAPS: 300.0000 mg | ORAL_CAPSULE | Freq: Three times a day (TID) | ORAL | 0 refills | Status: DC

## 2020-11-24 ENCOUNTER — Encounter (INDEPENDENT_AMBULATORY_CARE_PROVIDER_SITE_OTHER): Payer: Self-pay | Admitting: Family Medicine

## 2020-11-25 ENCOUNTER — Ambulatory Visit: Payer: BC Managed Care – PPO | Admitting: Allergy & Immunology

## 2020-11-25 NOTE — Telephone Encounter (Signed)
Last OV with Dr Wallace 

## 2020-11-28 ENCOUNTER — Other Ambulatory Visit (INDEPENDENT_AMBULATORY_CARE_PROVIDER_SITE_OTHER): Payer: Self-pay

## 2020-11-28 DIAGNOSIS — E65 Localized adiposity: Secondary | ICD-10-CM

## 2020-11-28 DIAGNOSIS — E8881 Metabolic syndrome: Secondary | ICD-10-CM

## 2020-11-28 MED ORDER — OZEMPIC (0.25 OR 0.5 MG/DOSE) 2 MG/1.5ML ~~LOC~~ SOPN: 0.5000 mg | PEN_INJECTOR | SUBCUTANEOUS | 1 refills | Status: DC

## 2020-11-30 NOTE — Progress Notes (Signed)
° °  I, Philbert Riser, LAT, ATC acting as a scribe for [name], MD.  [name] is a 45 y.o. female who presents to [facility] at [facility] today for f/u R knee (due to DJD) and L hip pain (due to GT bursitis). Pt was last seen by Dr. Denyse Amass on 10/19/20 and was advised to consider weight loss, knee brace and was referred to PT, of which she's completed 0 visits. Pt had a PT appointment scheduled on 12/27, however it's noted that she left w/o being seen because she was told she would get a bill for between $300-$1000. Pt has been going to the pool and doing some exercises. Today, pt reports L hip is all better. Pt reports R knee has started bothering her again about 2 weeks ago. Pt has been doing acupuncture w/ her chiropractor. Pt locates pain to anterior aspect of knee and deep in joint.   Swelling: yes Mechanical symptoms: yes  Dx imaging: 10/19/20 R knee XR  10/19/20 L hip/pelvis XR  Pertinent review of systems: No fevers or chills  Relevant historical information: Obesity   Exam:  BP 114/80 (BP Location: Right Arm, Patient Position: Sitting, Cuff Size: Large)    Pulse 71    Ht 5\' 5"  (1.651 m)    Wt (!) 300 lb 12.8 oz (136.4 kg)    LMP 11/07/2012    SpO2 98%    BMI 50.06 kg/m   Wt Readings from Last 5 Encounters:  12/01/20 (!) 300 lb 12.8 oz (136.4 kg)  11/11/20 291 lb (132 kg)  10/19/20 (!) 305 lb 9.6 oz (138.6 kg)  10/06/20 (!) 301 lb (136.5 kg)  09/12/20 299 lb (135.6 kg)    General: Well Developed, well nourished, and in no acute distress.   MSK: Right knee normal-appearing Normal motion with crepitation. Stable ligamentous exam. Intact strength.     Assessment and Plan: 45 y.o. female with right knee pain thought to be due to DJD.  Patient had a steroid knee injection about 6 weeks ago and is starting to have return of pain.  At this point we will work on authorizing hyaluronic acid injections which may be helpful next step.  Also discussed  other treatment options including quad strengthening and further emphasizing weight loss.  Recheck following approval of hyaluronic acid.   Discussed warning signs or symptoms. Please see discharge instructions. Patient expresses understanding.   The above documentation has been reviewed and is accurate and complete 47, M.D.   Total encounter time 20 minutes including face-to-face time with the patient and, reviewing past medical record, and charting on the date of service.   Treatment plan and options

## 2020-12-01 ENCOUNTER — Other Ambulatory Visit: Payer: Self-pay

## 2020-12-01 ENCOUNTER — Ambulatory Visit: Payer: BC Managed Care – PPO | Admitting: Family Medicine

## 2020-12-01 ENCOUNTER — Ambulatory Visit: Payer: Self-pay

## 2020-12-01 VITALS — BP 114/80 | HR 71 | Ht 65.0 in | Wt 300.8 lb

## 2020-12-01 DIAGNOSIS — M25561 Pain in right knee: Secondary | ICD-10-CM | POA: Diagnosis not present

## 2020-12-01 DIAGNOSIS — Z6841 Body Mass Index (BMI) 40.0 and over, adult: Secondary | ICD-10-CM

## 2020-12-01 DIAGNOSIS — G8929 Other chronic pain: Secondary | ICD-10-CM

## 2020-12-01 NOTE — Patient Instructions (Signed)
Thank you for coming in today.  Plan for gel shots. Hyaluronic Acid.   Getting back in the weight loss game will help a lot too.

## 2020-12-07 ENCOUNTER — Other Ambulatory Visit: Payer: Self-pay

## 2020-12-07 ENCOUNTER — Encounter (INDEPENDENT_AMBULATORY_CARE_PROVIDER_SITE_OTHER): Payer: Self-pay | Admitting: Family Medicine

## 2020-12-07 ENCOUNTER — Ambulatory Visit (INDEPENDENT_AMBULATORY_CARE_PROVIDER_SITE_OTHER): Payer: BC Managed Care – PPO | Admitting: Family Medicine

## 2020-12-07 VITALS — BP 120/79 | HR 68 | Temp 97.6°F | Ht 65.0 in | Wt 290.0 lb

## 2020-12-07 DIAGNOSIS — E559 Vitamin D deficiency, unspecified: Secondary | ICD-10-CM

## 2020-12-07 DIAGNOSIS — E782 Mixed hyperlipidemia: Secondary | ICD-10-CM

## 2020-12-07 DIAGNOSIS — R7303 Prediabetes: Secondary | ICD-10-CM | POA: Diagnosis not present

## 2020-12-07 DIAGNOSIS — Z6841 Body Mass Index (BMI) 40.0 and over, adult: Secondary | ICD-10-CM

## 2020-12-07 DIAGNOSIS — Z9189 Other specified personal risk factors, not elsewhere classified: Secondary | ICD-10-CM | POA: Diagnosis not present

## 2020-12-07 DIAGNOSIS — Z91018 Allergy to other foods: Secondary | ICD-10-CM | POA: Diagnosis not present

## 2020-12-07 DIAGNOSIS — F419 Anxiety disorder, unspecified: Secondary | ICD-10-CM

## 2020-12-07 DIAGNOSIS — Z20822 Contact with and (suspected) exposure to covid-19: Secondary | ICD-10-CM

## 2020-12-08 LAB — SAR COV2 SEROLOGY (COVID19)AB(IGG),IA
SARS-CoV-2 Semi-Quant IgG Ab: >800 AU/mL (ref <13.0)
SARS-CoV-2 Spike Ab Interp: POSITIVE

## 2020-12-08 LAB — SARS-COV-2 SEMI-QUANTITATIVE TOTAL ANTIBODY, SPIKE
SARS-CoV-2 Semi-Quant Total Ab: >2500 U/mL (ref <0.8)
SARS-CoV-2 Spike Ab Interp: POSITIVE

## 2020-12-12 NOTE — Progress Notes (Signed)
Chief Complaint:   OBESITY [name] is here to discuss her progress with her obesity treatment plan along with follow-up of her obesity related diagnoses.   Today's visit was #: 9 Starting weight: 306 lbs Starting date: 05/19/2020 Today's weight: 290 lbs Today's date: 12/07/2020 Total lbs lost to date: 16 lbs Body mass index is 48.26 kg/m.  Total weight loss percentage to date: -5.23%  Interim History: [name] says that Ozempic is helpful.  She is getting 50-60 grams of protein per day.  She has been cutting out bread. Nutrition Plan: keeping a food journal and adhering to recommended goals of 1500 calories and 100 grams of protein for 80% of the time. Anti-obesity medications: Ozempic 0.5 mg weekly. Reported side effects: None. Activity: Water PT (aerobics) for 30-45 minutes 2-3 times per week.  Assessment/Plan:   1. Prediabetes Not at goal. Goal is HgbA1c < 5.7.  Medication: Ozempic 0.5 mg .subuc weekly.    Plan:  She will continue to focus on protein-rich, low simple carbohydrate foods. We reviewed the importance of hydration, regular exercise for stress reduction, and restorative sleep.   Lab Results  Component Value Date   HGBA1C 5.9 (H) 09/12/2020   Lab Results  Component Value Date   INSULIN 40.6 (H) 09/12/2020   2. Vitamin D deficiency Not at goal. Current vitamin D is 24.4, tested on 09/12/2020. Optimal goal > 50 ng/dL.   Plan: Continue to take prescription Vitamin D @50 ,000 IU every week as prescribed.  Follow-up for routine testing of Vitamin D, at least 2-3 times per year to avoid over-replacement.  3. Mixed hyperlipidemia Course: Not at goal. Lipid-lowering medications: None.   Plan: Dietary changes: Increase soluble fiber, decrease simple carbohydrates, decrease saturated fat. Exercise changes: Moderate to vigorous-intensity aerobic activity 150 minutes per week or as tolerated. We will continue to monitor along with PCP/specialists as it pertains to her  weight loss journey.  Lab Results  Component Value Date   CHOL 248 (H) 09/12/2020   HDL 38 (L) 09/12/2020   LDLCALC 166 (H) 09/12/2020   TRIG 237 (H) 09/12/2020   CHOLHDL 6.5 (H) 09/12/2020   Lab Results  Component Value Date   ALT 25 09/12/2020   AST 20 09/12/2020   ALKPHOS 111 09/12/2020   BILITOT 0.3 09/12/2020   The 10-year ASCVD risk score 09/14/2020 DC Jr., et al., 2013) is: 2.6%   Values used to calculate the score:     Age: 59 years     Sex: Female     Is Non-Hispanic African American: No     Diabetic: No     Tobacco smoker: No     Systolic Blood Pressure: 120 mmHg     Is BP treated: Yes     HDL Cholesterol: 38 mg/dL     Total Cholesterol: 248 mg/dL  4. Multiple food allergies/intolerances  [name] is vegan, gluten-free.  She is allergic to whey protein (cheese, cream, milk), honey, cauliflower, oranges, mango (tropical). Fish/shellfish are okay. Hates [name]. Other meat okay. Gluten-free cereal okay.  Send protein cereal. Plan: Add multivitamin.  5. Exposure to COVID-19 virus [name] has been exposed to COVID-19 in the past.    Plan:  Will check labs today, as per below.  - SAR CoV2 Serology (COVID 19)AB(IGG)IA - SARS-CoV-2 Semi-Quantitative Total Antibody, Spike  6. Anxiety, with emotional eating Kampbell is taking Cymbalta 60 mg daily - okay for pain.  She is working with her therapist, Ms. [name].  Plan:  Behavior modification  techniques were discussed today to help [name] deal with her anxiety.    7. At risk for heart disease Due to [name]'s current state of health and medical condition(s), she is at a higher risk for heart disease.  This puts the patient at much greater risk to subsequently develop cardiopulmonary conditions that can significantly affect patient's quality of life in a negative manner.    At least 10 minutes were spent on counseling [name] about these concerns today, and I stressed the importance of reversing risks factors of obesity,  especially truncal and visceral fat, hypertension, hyperlipidemia, and pre-diabetes.  The initial goal is to lose at least 5-10% of starting weight to help reduce these risk factors.  Counseling:  Intensive lifestyle modifications were discussed with [name] as the most appropriate first line of treatment.  she will continue to work on diet, exercise, and weight loss efforts.  We will continue to reassess these conditions on a fairly regular basis in an attempt to decrease the patient's overall morbidity and mortality.  Evidence-based interventions for health behavior change were utilized today including the discussion of self monitoring techniques, problem-solving barriers, and SMART goal setting techniques.  Specifically, regarding patient's less desirable eating habits and patterns, we employed the technique of small changes when [name] has not been able to fully commit to her prudent nutritional plan.  8. Class 3 severe obesity with serious comorbidity and body mass index (BMI) of 45.0 to 49.9 in adult, unspecified obesity type (HCC)  Course: [name] is currently in the action stage of change. As such, her goal is to continue with weight loss efforts.   Nutrition goals: She has agreed to keeping a food journal and adhering to recommended goals of 1500 calories and 100 grams of protein.   Exercise goals: As is.  Behavioral modification strategies: increasing lean protein intake, decreasing simple carbohydrates, increasing vegetables and increasing water intake.  [name] has agreed to follow-up with our clinic in 3 weeks. She was informed of the importance of frequent follow-up visits to maximize her success with intensive lifestyle modifications for her multiple health conditions.   Objective:   Blood pressure 120/79, pulse 68, temperature 97.6 F (36.4 C), temperature source Oral, height 5\' 5"  (1.651 m), weight 290 lb (131.5 kg), last menstrual period 11/07/2012, SpO2 98 %. Body mass index  is 48.26 kg/m.  General: Cooperative, alert, well developed, in no acute distress. HEENT: Conjunctivae and lids unremarkable. Cardiovascular: Regular rhythm.  Lungs: Normal work of breathing. Neurologic: No focal deficits.   Lab Results  Component Value Date   CREATININE 1.01 (H) 09/12/2020   BUN 11 09/12/2020   NA 138 09/12/2020   K 4.4 09/12/2020   CL 100 09/12/2020   CO2 22 09/12/2020   Lab Results  Component Value Date   ALT 25 09/12/2020   AST 20 09/12/2020   ALKPHOS 111 09/12/2020   BILITOT 0.3 09/12/2020   Lab Results  Component Value Date   HGBA1C 5.9 (H) 09/12/2020   HGBA1C 5.7 (H) 05/13/2020   HGBA1C 5.4 02/25/2013   Lab Results  Component Value Date   INSULIN 40.6 (H) 09/12/2020   Lab Results  Component Value Date   TSH 1.750 05/13/2020   Lab Results  Component Value Date   CHOL 248 (H) 09/12/2020   HDL 38 (L) 09/12/2020   LDLCALC 166 (H) 09/12/2020   TRIG 237 (H) 09/12/2020   CHOLHDL 6.5 (H) 09/12/2020   Lab Results  Component Value Date   WBC 7.1 09/12/2020  HGB 14.2 09/12/2020   HCT 43.1 09/12/2020   MCV 92 09/12/2020   PLT 296 09/12/2020   Lab Results  Component Value Date   IRON 83 09/12/2020   TIBC 293 09/12/2020   FERRITIN 119 09/12/2020   Attestation Statements:   Reviewed by clinician on day of visit: allergies, medications, problem list, medical history, surgical history, family history, social history, and previous encounter notes.  I, Insurance claims handler, CMA, am acting as transcriptionist for Helane Rima, DO  I have reviewed the above documentation for accuracy and completeness, and I agree with the above. Helane Rima, DO

## 2020-12-26 ENCOUNTER — Telehealth: Payer: Self-pay

## 2020-12-26 NOTE — Telephone Encounter (Signed)
Patient called and said she was being billed for 92.00 for DOS 1/7 and 11/11/20.  This should just be a co-pay visit on both days.  I called [employer] and talked with Gene she said we need to rebill the DOS with another code.  [employer] does not pay 16109.  She would not give me a code to bill so I called Tonya at [facility].  She told me to change the code and bill 60454 - CR and it would clear.  I have changed the code and called the patient back.  I added a note to the correction that [employer] said I needed to rebilled the claim.

## 2020-12-31 ENCOUNTER — Other Ambulatory Visit: Payer: Self-pay | Admitting: Family Medicine

## 2020-12-31 ENCOUNTER — Other Ambulatory Visit (INDEPENDENT_AMBULATORY_CARE_PROVIDER_SITE_OTHER): Payer: Self-pay | Admitting: Family Medicine

## 2020-12-31 DIAGNOSIS — E559 Vitamin D deficiency, unspecified: Secondary | ICD-10-CM

## 2021-01-10 ENCOUNTER — Ambulatory Visit (INDEPENDENT_AMBULATORY_CARE_PROVIDER_SITE_OTHER): Payer: BC Managed Care – PPO | Admitting: Family Medicine

## 2021-01-10 ENCOUNTER — Encounter (INDEPENDENT_AMBULATORY_CARE_PROVIDER_SITE_OTHER): Payer: Self-pay | Admitting: Family Medicine

## 2021-01-10 ENCOUNTER — Other Ambulatory Visit: Payer: Self-pay

## 2021-01-10 VITALS — BP 137/72 | HR 62 | Temp 98.0°F | Ht 65.0 in | Wt 293.0 lb

## 2021-01-10 DIAGNOSIS — E8881 Metabolic syndrome: Secondary | ICD-10-CM | POA: Diagnosis not present

## 2021-01-10 DIAGNOSIS — Z6841 Body Mass Index (BMI) 40.0 and over, adult: Secondary | ICD-10-CM

## 2021-01-10 DIAGNOSIS — Z9189 Other specified personal risk factors, not elsewhere classified: Secondary | ICD-10-CM

## 2021-01-10 DIAGNOSIS — E559 Vitamin D deficiency, unspecified: Secondary | ICD-10-CM

## 2021-01-10 DIAGNOSIS — G4709 Other insomnia: Secondary | ICD-10-CM

## 2021-01-10 DIAGNOSIS — F411 Generalized anxiety disorder: Secondary | ICD-10-CM

## 2021-01-11 ENCOUNTER — Ambulatory Visit: Payer: BC Managed Care – PPO | Admitting: Family Medicine

## 2021-01-11 ENCOUNTER — Ambulatory Visit: Payer: Self-pay

## 2021-01-11 ENCOUNTER — Encounter (INDEPENDENT_AMBULATORY_CARE_PROVIDER_SITE_OTHER): Payer: Self-pay | Admitting: Family Medicine

## 2021-01-11 DIAGNOSIS — M1711 Unilateral primary osteoarthritis, right knee: Secondary | ICD-10-CM | POA: Diagnosis not present

## 2021-01-11 DIAGNOSIS — G8929 Other chronic pain: Secondary | ICD-10-CM | POA: Insufficient documentation

## 2021-01-11 DIAGNOSIS — M25561 Pain in right knee: Secondary | ICD-10-CM | POA: Diagnosis not present

## 2021-01-11 MED ORDER — OZEMPIC (1 MG/DOSE) 4 MG/3ML ~~LOC~~ SOPN: 1.0000 mg | PEN_INJECTOR | SUBCUTANEOUS | 0 refills | Status: DC

## 2021-01-11 MED ORDER — VITAMIN D (ERGOCALCIFEROL) 1.25 MG (50000 UNIT) PO CAPS: 50000.0000 [IU] | ORAL_CAPSULE | ORAL | 0 refills | Status: DC

## 2021-01-11 MED ORDER — TRAZODONE HCL 50 MG PO TABS: 50.0000 mg | ORAL_TABLET | Freq: Every day | ORAL | 0 refills | Status: DC

## 2021-01-11 NOTE — Patient Instructions (Addendum)
Thank you for coming in today.  Please schedule your next gel injection for 1 weeks.

## 2021-01-11 NOTE — Progress Notes (Signed)
[name] presents to clinic today for Gelsyn injection right knee 1/3  Procedure: Real-time Ultrasound Guided Injection of right knee superior lateral patellar space Device: Philips Affiniti 50G Images permanently stored and available for review in PACS Verbal informed consent obtained.  Discussed risks and benefits of procedure. Warned about infection bleeding damage to structures skin hypopigmentation and fat atrophy among others. Patient expresses understanding and agreement Time-out conducted.   Noted no overlying erythema, induration, or other signs of local infection.   Skin prepped in a sterile fashion.   Local anesthesia: Topical Ethyl chloride.   With sterile technique and under real time ultrasound guidance:  Gelsyn 16.8 mg injected into right knee joint. Fluid seen entering the joint capsule.   Completed without difficulty   Spinal needle used Advised to call if fevers/chills, erythema, induration, drainage, or persistent bleeding.   Images permanently stored and available for review in the ultrasound unit.  Impression: Technically successful ultrasound guided injection.   Lot number [id number]  Return 1 week for Gelsyn injection 2/3

## 2021-01-17 NOTE — Progress Notes (Signed)
Chief Complaint:   OBESITY [name] is here to discuss her progress with her obesity treatment plan along with follow-up of her obesity related diagnoses.   Today's visit was #: 10 Starting weight: 306 lbs Starting date: 05/19/2020 Today's weight: 293 lbs Today's date: 01/10/2021 Total lbs lost to date: 13 lbs Body mass index is 48.76 kg/m.  Total weight loss percentage to date: -4.25%  Interim History:  [name] reports right knee pain.  She gets injections with Dr. Denyse Amass.  [name] [name] referred to [facility].  Current Meal Plan: keeping a food journal and adhering to recommended goals of 1500 calories and 100 grams of protein for 0% of the time.  Current Exercise Plan: None. Current Anti-Obesity Medications: Ozempic. Side effects: None.  Assessment/Plan:   1. Metabolic syndrome With polyphagia.  Starting goal: Lose 7-10% of starting weight. She will continue to focus on protein-rich, low simple carbohydrate foods. We reviewed the importance of hydration, regular exercise for stress reduction, and restorative sleep.  We will continue to check lab work every 3 months, with 10% weight loss, or should any other concerns arise.  - Refill Semaglutide, 1 MG/DOSE, (OZEMPIC, 1 MG/DOSE,) 4 MG/3ML SOPN; Inject 1 mg into the skin once a week.  Dispense: 3 mL; Refill: 0  2. Other insomnia This is worsening. She is using a CPAP.  Takes Cymbalta once daily. Current treatment: None.  Plan: Recommend sleep hygiene measures including regular sleep schedule, optimal sleep environment, and relaxing presleep rituals.  She will start trazodone 50 mg at bedtime.  We discussed the serotonin risks.  - Start traZODone (DESYREL) 50 MG tablet; Take 1 tablet (50 mg total) by mouth at bedtime.  Dispense: 30 tablet; Refill: 0  3. Vitamin D deficiency Not at goal. Current vitamin D is 24.4, tested on 09/12/2020. Optimal goal > 50 ng/dL.  She is taking vitamin D 50,000 IU weekly.  Plan:  Continue to take prescription Vitamin D @50 ,000 IU every week as prescribed.  Follow-up for routine testing of Vitamin D, at least 2-3 times per year to avoid over-replacement.  - Refill Vitamin D, Ergocalciferol, (DRISDOL) 1.25 MG (50000 UNIT) CAPS capsule; Take 1 capsule (50,000 Units total) by mouth 2 (two) times a week.  Dispense: 24 capsule; Refill: 0  4. GAD (generalized anxiety disorder) [name] is taking Buspar 10 mg three times daily as needed and Cymbalta 60 mg daily.  Plan:  Continue medications. Behavior modification techniques were discussed today to help [name] deal with her anxiety.    - Refill DULoxetine (CYMBALTA) 60 MG capsule; Take 1 capsule (60 mg total) by mouth daily.  Dispense: 90 capsule; Refill: 3  5. At risk for nausea [name] is at risk for nausea due to taking Ozempic.  [name] was given approximately 8 minutes of nausea prevention counseling today. [name] is at risk for nausea due to her new or current medication. She was encouraged to titrate her medication slowly, make sure to stay hydrated, eat smaller portions throughout the day, and avoid high fat meals.   6. Class 3 severe obesity with serious comorbidity and body mass index (BMI) of 45.0 to 49.9 in adult, unspecified obesity type (HCC)  Course: [name] is currently in the action stage of change. As such, her goal is to continue with weight loss efforts.   Nutrition goals: She has agreed to keeping a food journal and adhering to recommended goals of 1500 calories and 100 grams of protein.   Exercise  goals: Increase activity.  Behavioral modification strategies: increasing lean protein intake, decreasing simple carbohydrates, increasing vegetables, increasing water intake and emotional eating strategies.  [name] has agreed to follow-up with our clinic in 3-4 weeks. She was informed of the importance of frequent follow-up visits to maximize her success with intensive lifestyle modifications for  her multiple health conditions.   Objective:   Blood pressure 137/72, pulse 62, temperature 98 F (36.7 C), temperature source Oral, height 5\' 5"  (1.651 m), weight 293 lb (132.9 kg), last menstrual period 11/07/2012, SpO2 97 %. Body mass index is 48.76 kg/m.  General: Cooperative, alert, well developed, in no acute distress. HEENT: Conjunctivae and lids unremarkable. Cardiovascular: Regular rhythm.  Lungs: Normal work of breathing. Neurologic: No focal deficits.   Lab Results  Component Value Date   CREATININE 1.01 (H) 09/12/2020   BUN 11 09/12/2020   NA 138 09/12/2020   K 4.4 09/12/2020   CL 100 09/12/2020   CO2 22 09/12/2020   Lab Results  Component Value Date   ALT 25 09/12/2020   AST 20 09/12/2020   ALKPHOS 111 09/12/2020   BILITOT 0.3 09/12/2020   Lab Results  Component Value Date   HGBA1C 5.9 (H) 09/12/2020   HGBA1C 5.7 (H) 05/13/2020   HGBA1C 5.4 02/25/2013   Lab Results  Component Value Date   INSULIN 40.6 (H) 09/12/2020   Lab Results  Component Value Date   TSH 1.750 05/13/2020   Lab Results  Component Value Date   CHOL 248 (H) 09/12/2020   HDL 38 (L) 09/12/2020   LDLCALC 166 (H) 09/12/2020   TRIG 237 (H) 09/12/2020   CHOLHDL 6.5 (H) 09/12/2020   Lab Results  Component Value Date   WBC 7.1 09/12/2020   HGB 14.2 09/12/2020   HCT 43.1 09/12/2020   MCV 92 09/12/2020   PLT 296 09/12/2020   Lab Results  Component Value Date   IRON 83 09/12/2020   TIBC 293 09/12/2020   FERRITIN 119 09/12/2020   Attestation Statements:   Reviewed by clinician on day of visit: allergies, medications, problem list, medical history, surgical history, family history, social history, and previous encounter notes.  I, 09/14/2020, CMA, am acting as transcriptionist for Insurance claims handler, DO  I have reviewed the above documentation for accuracy and completeness, and I agree with the above. Helane Rima, DO

## 2021-01-18 ENCOUNTER — Other Ambulatory Visit: Payer: Self-pay

## 2021-01-18 ENCOUNTER — Ambulatory Visit: Payer: Self-pay

## 2021-01-18 ENCOUNTER — Ambulatory Visit (INDEPENDENT_AMBULATORY_CARE_PROVIDER_SITE_OTHER): Payer: BC Managed Care – PPO | Admitting: Family Medicine

## 2021-01-18 DIAGNOSIS — M1711 Unilateral primary osteoarthritis, right knee: Secondary | ICD-10-CM

## 2021-01-18 DIAGNOSIS — G8929 Other chronic pain: Secondary | ICD-10-CM

## 2021-01-18 DIAGNOSIS — M25561 Pain in right knee: Secondary | ICD-10-CM | POA: Diagnosis not present

## 2021-01-18 NOTE — Progress Notes (Signed)
[name] presents to clinic today for Gelsyn injection right knee 2/3  Procedure: Real-time Ultrasound Guided Injection of right knee superior lateral patellar space Device: Philips Affiniti 50G Images permanently stored and available for review in PACS Verbal informed consent obtained.  Discussed risks and benefits of procedure. Warned about infection bleeding damage to structures skin hypopigmentation and fat atrophy among others. Patient expresses understanding and agreement Time-out conducted.   Noted no overlying erythema, induration, or other signs of local infection.   Skin prepped in a sterile fashion.   Local anesthesia: Topical Ethyl chloride.   With sterile technique and under real time ultrasound guidance:  Gelsyn 16.8 mg injected into right knee. Fluid seen entering the joint capsule.   Completed without difficulty   Spinal needle used Advised to call if fevers/chills, erythema, induration, drainage, or persistent bleeding.   Images permanently stored and available for review in the ultrasound unit.  Impression: Technically successful ultrasound guided injection.   Lot #[id number]  Patient notes that her right knee is much improved already after 1 injection in her left knee is starting to hurt and would like to proceed with Gelsyn series for left knee if possible.

## 2021-01-22 ENCOUNTER — Other Ambulatory Visit: Payer: Self-pay | Admitting: Family Medicine

## 2021-01-25 ENCOUNTER — Ambulatory Visit: Payer: Self-pay

## 2021-01-25 ENCOUNTER — Other Ambulatory Visit: Payer: Self-pay

## 2021-01-25 ENCOUNTER — Ambulatory Visit (INDEPENDENT_AMBULATORY_CARE_PROVIDER_SITE_OTHER): Payer: BC Managed Care – PPO | Admitting: Family Medicine

## 2021-01-25 DIAGNOSIS — G8929 Other chronic pain: Secondary | ICD-10-CM

## 2021-01-25 DIAGNOSIS — M25561 Pain in right knee: Secondary | ICD-10-CM

## 2021-01-25 NOTE — Progress Notes (Signed)
[name] presents to clinic today for Gelsyn injection right knee 3/3  Procedure: Real-time Ultrasound Guided Injection of right knee superior lateral patellar space Device: Philips Affiniti 50G Images permanently stored and available for review in PACS Verbal informed consent obtained.  Discussed risks and benefits of procedure. Warned about infection bleeding damage to structures skin hypopigmentation and fat atrophy among others. Patient expresses understanding and agreement Time-out conducted.   Noted no overlying erythema, induration, or other signs of local infection.   Skin prepped in a sterile fashion.   Local anesthesia: Topical Ethyl chloride.   With sterile technique and under real time ultrasound guidance:  Gelsyn 16.8 mg injected into right knee superior lateral patellar space. Fluid seen entering the knee joint.   Completed without difficulty   Spinal needle used  Advised to call if fevers/chills, erythema, induration, drainage, or persistent bleeding.   Images permanently stored and available for review in the ultrasound unit.  Impression: Technically successful ultrasound guided injection.    Lot number [id number]  Return as needed

## 2021-01-25 NOTE — Patient Instructions (Signed)
Thank you for coming in today.   Call or go to the ER if you develop a large red swollen joint with extreme pain or oozing puss.    Return as needed.  

## 2021-02-04 ENCOUNTER — Other Ambulatory Visit: Payer: Self-pay | Admitting: Family Medicine

## 2021-02-22 ENCOUNTER — Ambulatory Visit (INDEPENDENT_AMBULATORY_CARE_PROVIDER_SITE_OTHER): Payer: BC Managed Care – PPO | Admitting: Family Medicine

## 2021-02-22 ENCOUNTER — Telehealth (INDEPENDENT_AMBULATORY_CARE_PROVIDER_SITE_OTHER): Payer: BC Managed Care – PPO | Admitting: Family Medicine

## 2021-02-22 ENCOUNTER — Encounter (INDEPENDENT_AMBULATORY_CARE_PROVIDER_SITE_OTHER): Payer: Self-pay | Admitting: Family Medicine

## 2021-02-22 ENCOUNTER — Other Ambulatory Visit: Payer: Self-pay | Admitting: Family Medicine

## 2021-02-22 ENCOUNTER — Other Ambulatory Visit: Payer: Self-pay

## 2021-02-22 ENCOUNTER — Ambulatory Visit: Payer: Self-pay

## 2021-02-22 ENCOUNTER — Other Ambulatory Visit (INDEPENDENT_AMBULATORY_CARE_PROVIDER_SITE_OTHER): Payer: Self-pay | Admitting: Family Medicine

## 2021-02-22 VITALS — BP 122/75 | HR 76 | Ht 65.0 in | Wt 297.0 lb

## 2021-02-22 DIAGNOSIS — G43009 Migraine without aura, not intractable, without status migrainosus: Secondary | ICD-10-CM

## 2021-02-22 DIAGNOSIS — M1712 Unilateral primary osteoarthritis, left knee: Secondary | ICD-10-CM | POA: Diagnosis not present

## 2021-02-22 DIAGNOSIS — G8929 Other chronic pain: Secondary | ICD-10-CM | POA: Diagnosis not present

## 2021-02-22 DIAGNOSIS — D508 Other iron deficiency anemias: Secondary | ICD-10-CM

## 2021-02-22 DIAGNOSIS — M25562 Pain in left knee: Secondary | ICD-10-CM

## 2021-02-22 DIAGNOSIS — E8881 Metabolic syndrome: Secondary | ICD-10-CM | POA: Diagnosis not present

## 2021-02-22 DIAGNOSIS — E559 Vitamin D deficiency, unspecified: Secondary | ICD-10-CM

## 2021-02-22 DIAGNOSIS — E782 Mixed hyperlipidemia: Secondary | ICD-10-CM

## 2021-02-22 DIAGNOSIS — G4709 Other insomnia: Secondary | ICD-10-CM

## 2021-02-22 DIAGNOSIS — R7303 Prediabetes: Secondary | ICD-10-CM

## 2021-02-22 DIAGNOSIS — F419 Anxiety disorder, unspecified: Secondary | ICD-10-CM

## 2021-02-22 MED ORDER — TRAZODONE HCL 50 MG PO TABS: 50.0000 mg | ORAL_TABLET | Freq: Every day | ORAL | 0 refills | Status: DC

## 2021-02-22 MED ORDER — BUTALBITAL-APAP-CAFFEINE 50-325-40 MG PO TABS: 1.0000 | ORAL_TABLET | Freq: Four times a day (QID) | ORAL | 0 refills | Status: DC | PRN

## 2021-02-22 MED ORDER — OZEMPIC (1 MG/DOSE) 4 MG/3ML ~~LOC~~ SOPN: 1.0000 mg | PEN_INJECTOR | SUBCUTANEOUS | 0 refills | Status: DC

## 2021-02-22 NOTE — Patient Instructions (Signed)
Thank you for coming in today.   You received an injection today. Seek immediate medical attention if the joint becomes red, extremely painful, or is oozing fluid.  

## 2021-02-22 NOTE — Telephone Encounter (Signed)
Dr.Wallace °

## 2021-02-22 NOTE — Progress Notes (Signed)
Kemia presents to clinic today for Gelsyn injection left knee 1/3  She previously completed right knee Gelsyn series and did very well and is here today for her left knee  Procedure: Real-time Ultrasound Guided Injection of superior lateral patellar space Device: Philips Affiniti 50G Images permanently stored and available for review in PACS Verbal informed consent obtained.  Discussed risks and benefits of procedure. Warned about infection bleeding damage to structures skin hypopigmentation and fat atrophy among others. Patient expresses understanding and agreement Time-out conducted.   Noted no overlying erythema, induration, or other signs of local infection.   Skin prepped in a sterile fashion.   Local anesthesia: Topical Ethyl chloride.   With sterile technique and under real time ultrasound guidance:  Gelsyn 16.8 mg injected into knee joint. Fluid seen entering the joint capsule.   Completed without difficulty   Spinal needle used Advised to call if fevers/chills, erythema, induration, drainage, or persistent bleeding.   Images permanently stored and available for review in the ultrasound unit.  Impression: Technically successful ultrasound guided injection.  Lot #[id number]  Return in 1 week for Gelsyn injection 2/3

## 2021-02-22 NOTE — Progress Notes (Signed)
TeleHealth Visit:  Due to the COVID-19 pandemic, this visit was completed with telemedicine (audio/video) technology to reduce patient and provider exposure as well as to preserve personal protective equipment.   [name] has verbally consented to this TeleHealth visit. The patient is located at home, the provider is located at the [facility] office. The participants in this visit include the listed provider and patient. The visit was conducted today via MyChart video.  OBESITY [name] is here to discuss her progress with her obesity treatment plan along with follow-up of her obesity related diagnoses.   Chief Complaint  Patient presents with  . Follow-up    Journaling 1500 calories 100 grams of protein 30% of the time. Has increased her walking. Weighed at home this AM 297 lbs. Will need a refill on Ozempic and Trazodone.   Interim History: Overwhelmed at work. 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 She has applied to another position. She endorses stress eating. Walked on [employer] [place] last week, felt better than she has in a while. Left knee injections start today. She thinks that she may have an intolerance to artifical sweeteners, causing diarrhea. Already gluten-free. See chiropractor. Ordered $400 inserts.   Assessment/Plan:   1. Other insomnia This is moderately controlled.   Plan: Recommend sleep hygiene measures including regular sleep schedule, optimal sleep environment, and relaxing presleep rituals.   - traZODone (DESYREL) 50 MG tablet; Take 1 tablet (50 mg total) by mouth at bedtime.  Dispense: 30 tablet; Refill: 0  2. Metabolic syndrome Starting goal: Lose 7-10% of starting weight. She will continue to focus on protein-rich, low simple carbohydrate foods. We reviewed the importance of hydration, regular exercise for stress reduction, and restorative sleep.  We will continue to check lab work every 3 months, with 10% weight loss, or  should any other concerns arise.  - Semaglutide, 1 MG/DOSE, (OZEMPIC, 1 MG/DOSE,) 4 MG/3ML SOPN; Inject 1 mg into the skin once a week.  Dispense: 3 mL; Refill: 0  3. Vitamin D deficiency  - VITAMIN D 25 Hydroxy (Vit-D Deficiency, Fractures)  4. Anxiety Worsening.  Motivational interviewing as well as evidence-based interventions for health behavior change were utilized today including the discussion of self monitoring techniques, problem-solving barriers and SMART goal setting techniques.  Specifically regarding patient's less desirable eating habits and patterns, we employed the technique of small changes when she cannot fully commit to her prudent nutritional plan. Discussed cues and consequences, how thoughts affect eating, model of thoughts, feelings, and behaviors, and strategies for change by focusing on the cue. Discussed cognitive distortions, coping thoughts, and how to change your thoughts.  5. Mixed hyperlipidemia Course: Not at goal. Plan: Dietary changes: Increase soluble fiber. Decrease simple carbohydrates. Exercise changes: An average 40 minutes of moderate to vigorous-intensity aerobic activity 3 or 4 times per week.   Lab Results  Component Value Date   CHOL 194 02/22/2021   HDL 43 02/22/2021   LDLCALC 125 (H) 02/22/2021   TRIG 146 02/22/2021   CHOLHDL 4.5 (H) 02/22/2021   Lab Results  Component Value Date   ALT 24 02/22/2021   AST 19 02/22/2021   ALKPHOS 114 02/22/2021   BILITOT 0.3 02/22/2021   The 10-year ASCVD risk score Denman George DC Jr., et al., 2013) is: 1.5%   Values used to calculate the score:     Age: 45 years     Sex: Female     Is Non-Hispanic African American: No  Diabetic: No     Tobacco smoker: No     Systolic Blood Pressure: 122 mmHg     Is BP treated: Yes     HDL Cholesterol: 43 mg/dL     Total Cholesterol: 194 mg/dL  - Comprehensive metabolic panel - Lipid panel  6. Prediabetes Improving, but not optimized. Plan:  She will continue to  focus on protein-rich, low simple carbohydrate foods. We reviewed the importance of hydration, regular exercise for stress reduction, and restorative sleep.   Lab Results  Component Value Date   HGBA1C 5.5 02/22/2021   Lab Results  Component Value Date   INSULIN 40.6 (H) 09/12/2020   - Hemoglobin A1c  7. Other iron deficiency anemia  - CBC with Differential/Platelet - Anemia panel  [name] is currently in the action stage of change. As such, her goal is to continue with weight loss efforts. She has agreed to keeping a food journal and adhering to recommended goals of 1500 calories and 100 protein.   Exercise goals: For substantial health benefits, adults should do at least 150 minutes (2 hours and 30 minutes) a week of moderate-intensity, or 75 minutes (1 hour and 15 minutes) a week of vigorous-intensity aerobic physical activity, or an equivalent combination of moderate- and vigorous-intensity aerobic activity. Aerobic activity should be performed in episodes of at least 10 minutes, and preferably, it should be spread throughout the week. Adults should also include muscle-strengthening activities that involve all major muscle groups on 2 or more days a week.  Behavioral modification strategies: increasing lean protein intake, decreasing simple carbohydrates, increasing vegetables, increasing water intake, meal planning and cooking strategies and emotional eating strategies.  [name] has agreed to follow-up with our clinic in 2 weeks. She was informed of the importance of frequent follow-up visits to maximize her success with intensive lifestyle modifications for her multiple health conditions.  Objective:   VITALS: Per patient if applicable, see vitals. GENERAL: Alert and in no acute distress. CARDIOPULMONARY: No increased WOB. Speaking in clear sentences.  PSYCH: Pleasant and cooperative. Speech normal rate and rhythm. Affect is appropriate. Insight and judgement are appropriate.  Attention is focused, linear, and appropriate.  NEURO: Oriented as arrived to appointment on time with no prompting.   Lab Results  Component Value Date   CREATININE 1.01 (H) 09/12/2020   BUN 11 09/12/2020   NA 138 09/12/2020   K 4.4 09/12/2020   CL 100 09/12/2020   CO2 22 09/12/2020   Lab Results  Component Value Date   ALT 25 09/12/2020   AST 20 09/12/2020   ALKPHOS 111 09/12/2020   BILITOT 0.3 09/12/2020   Lab Results  Component Value Date   HGBA1C 5.9 (H) 09/12/2020   HGBA1C 5.7 (H) 05/13/2020   HGBA1C 5.4 02/25/2013   Lab Results  Component Value Date   INSULIN 40.6 (H) 09/12/2020   Lab Results  Component Value Date   TSH 1.750 05/13/2020   Lab Results  Component Value Date   CHOL 248 (H) 09/12/2020   HDL 38 (L) 09/12/2020   LDLCALC 166 (H) 09/12/2020   TRIG 237 (H) 09/12/2020   CHOLHDL 6.5 (H) 09/12/2020   Lab Results  Component Value Date   WBC 7.1 09/12/2020   HGB 14.2 09/12/2020   HCT 43.1 09/12/2020   MCV 92 09/12/2020   PLT 296 09/12/2020   Lab Results  Component Value Date   IRON 83 09/12/2020   TIBC 293 09/12/2020   FERRITIN 119 09/12/2020   Attestation Statements:  Reviewed by clinician on day of visit: allergies, medications, problem list, medical history, surgical history, family history, social history, and previous encounter notes.

## 2021-02-23 LAB — CBC WITH DIFFERENTIAL/PLATELET
Basophils Absolute: 0.1 10*3/uL (ref 0.0–0.2)
Basos: 1 %
EOS (ABSOLUTE): 1 10*3/uL — ABNORMAL HIGH (ref 0.0–0.4)
Eos: 11 %
Hemoglobin: 13.5 g/dL (ref 11.1–15.9)
Immature Grans (Abs): 0.1 10*3/uL (ref 0.0–0.1)
Immature Granulocytes: 1 %
Lymphocytes Absolute: 2.3 10*3/uL (ref 0.7–3.1)
Lymphs: 27 %
MCH: 30.3 pg (ref 26.6–33.0)
MCHC: 33.6 g/dL (ref 31.5–35.7)
MCV: 90 fL (ref 79–97)
Monocytes Absolute: 0.6 10*3/uL (ref 0.1–0.9)
Monocytes: 7 %
Neutrophils Absolute: 4.4 10*3/uL (ref 1.4–7.0)
Neutrophils: 53 %
Platelets: 280 10*3/uL (ref 150–450)
RBC: 4.46 x10E6/uL (ref 3.77–5.28)
RDW: 13.3 % (ref 11.7–15.4)
WBC: 8.4 10*3/uL (ref 3.4–10.8)

## 2021-02-23 LAB — COMPREHENSIVE METABOLIC PANEL
ALT: 24 IU/L (ref 0–32)
AST: 19 IU/L (ref 0–40)
Albumin/Globulin Ratio: 1.6 (ref 1.2–2.2)
Albumin: 4.2 g/dL (ref 3.8–4.8)
Alkaline Phosphatase: 114 IU/L (ref 44–121)
BUN/Creatinine Ratio: 6 — ABNORMAL LOW (ref 9–23)
BUN: 5 mg/dL — ABNORMAL LOW (ref 6–24)
Bilirubin Total: 0.3 mg/dL (ref 0.0–1.2)
CO2: 24 mmol/L (ref 20–29)
Calcium: 9 mg/dL (ref 8.7–10.2)
Chloride: 98 mmol/L (ref 96–106)
Creatinine, Ser: 0.84 mg/dL (ref 0.57–1.00)
Globulin, Total: 2.7 g/dL (ref 1.5–4.5)
Glucose: 80 mg/dL (ref 65–99)
Potassium: 4.5 mmol/L (ref 3.5–5.2)
Sodium: 139 mmol/L (ref 134–144)
Total Protein: 6.9 g/dL (ref 6.0–8.5)
eGFR: 87 mL/min/{1.73_m2} (ref >59)

## 2021-02-23 LAB — LIPID PANEL
Chol/HDL Ratio: 4.5 ratio — ABNORMAL HIGH (ref 0.0–4.4)
Cholesterol, Total: 194 mg/dL (ref 100–199)
HDL: 43 mg/dL (ref >39)
LDL Chol Calc (NIH): 125 mg/dL — ABNORMAL HIGH (ref 0–99)
Triglycerides: 146 mg/dL (ref 0–149)
VLDL Cholesterol Cal: 26 mg/dL (ref 5–40)

## 2021-02-23 LAB — HEMOGLOBIN A1C
Est. average glucose Bld gHb Est-mCnc: 111 mg/dL
Hgb A1c MFr Bld: 5.5 % (ref 4.8–5.6)

## 2021-02-23 LAB — ANEMIA PANEL
Ferritin: 119 ng/mL (ref 15–150)
Folate, Hemolysate: 383 ng/mL
Folate, RBC: 953 ng/mL (ref >498)
Hematocrit: 40.2 % (ref 34.0–46.6)
Iron Saturation: 22 % (ref 15–55)
Iron: 68 ug/dL (ref 27–159)
Retic Ct Pct: 2.3 % (ref 0.6–2.6)
Total Iron Binding Capacity: 315 ug/dL (ref 250–450)
UIBC: 247 ug/dL (ref 131–425)
Vitamin B-12: 1059 pg/mL (ref 232–1245)

## 2021-02-23 LAB — VITAMIN D 25 HYDROXY (VIT D DEFICIENCY, FRACTURES): Vit D, 25-Hydroxy: 51.5 ng/mL (ref 30.0–100.0)

## 2021-02-27 ENCOUNTER — Encounter (INDEPENDENT_AMBULATORY_CARE_PROVIDER_SITE_OTHER): Payer: Self-pay | Admitting: Family Medicine

## 2021-03-02 ENCOUNTER — Ambulatory Visit: Payer: Self-pay

## 2021-03-02 ENCOUNTER — Ambulatory Visit (INDEPENDENT_AMBULATORY_CARE_PROVIDER_SITE_OTHER): Payer: BC Managed Care – PPO | Admitting: Family Medicine

## 2021-03-02 ENCOUNTER — Other Ambulatory Visit: Payer: Self-pay

## 2021-03-02 DIAGNOSIS — G8929 Other chronic pain: Secondary | ICD-10-CM | POA: Diagnosis not present

## 2021-03-02 DIAGNOSIS — M25562 Pain in left knee: Secondary | ICD-10-CM

## 2021-03-02 NOTE — Progress Notes (Signed)
[name] presents to clinic today for Gelsyn injection left knee 2/3  Procedure: Real-time Ultrasound Guided Injection of left knee superior lateral patellar space Device: Philips Affiniti 50G Images permanently stored and available for review in PACS Verbal informed consent obtained.  Discussed risks and benefits of procedure. Warned about infection bleeding damage to structures skin hypopigmentation and fat atrophy among others. Patient expresses understanding and agreement Time-out conducted.   Noted no overlying erythema, induration, or other signs of local infection.   Skin prepped in a sterile fashion.   Local anesthesia: Topical Ethyl chloride.   With sterile technique and under real time ultrasound guidance:  Gelsyn 16.8 mg injected into knee joint. Fluid seen entering the joint capsule.   Completed without difficulty   Spinal needle used Advised to call if fevers/chills, erythema, induration, drainage, or persistent bleeding.   Images permanently stored and available for review in the ultrasound unit.  Impression: Technically successful ultrasound guided injection.   Lot #[id number]  Return 1 week for Gelsyn injection left knee 3/3

## 2021-03-02 NOTE — Patient Instructions (Signed)
You had your 2nd L knee Gelsyn injection today.  Call or go to the ER if you develop a large red swollen joint with extreme pain or oozing puss.   See you next week for the 3rd in the series.

## 2021-03-08 ENCOUNTER — Other Ambulatory Visit: Payer: Self-pay

## 2021-03-08 ENCOUNTER — Ambulatory Visit (INDEPENDENT_AMBULATORY_CARE_PROVIDER_SITE_OTHER): Payer: BC Managed Care – PPO | Admitting: Family Medicine

## 2021-03-08 ENCOUNTER — Ambulatory Visit: Payer: Self-pay

## 2021-03-08 DIAGNOSIS — M25562 Pain in left knee: Secondary | ICD-10-CM

## 2021-03-08 DIAGNOSIS — M1712 Unilateral primary osteoarthritis, left knee: Secondary | ICD-10-CM | POA: Diagnosis not present

## 2021-03-08 DIAGNOSIS — G8929 Other chronic pain: Secondary | ICD-10-CM

## 2021-03-08 NOTE — Progress Notes (Signed)
Michalene presents to clinic today for Gelsyn injection left knee 3/3  Procedure: Real-time Ultrasound Guided Injection of left knee superior lateral patellar space Device: Philips Affiniti 50G Images permanently stored and available for review in PACS Verbal informed consent obtained.  Discussed risks and benefits of procedure. Warned about infection bleeding damage to structures skin hypopigmentation and fat atrophy among others. Patient expresses understanding and agreement Time-out conducted.   Noted no overlying erythema, induration, or other signs of local infection.   Skin prepped in a sterile fashion.   Local anesthesia: Topical Ethyl chloride.   With sterile technique and under real time ultrasound guidance:  Gelsyn 16.8 mg injected into knee joint. Fluid seen entering the joint capsule.   Completed without difficulty   Spinal needle used  Advised to call if fevers/chills, erythema, induration, drainage, or persistent bleeding.   Images permanently stored and available for review in the ultrasound unit.  Impression: Technically successful ultrasound guided injection.     Lot #[id number]  Return as needed

## 2021-03-08 NOTE — Patient Instructions (Signed)
Return as needed Call or go to the ER if you develop a large red swollen joint with extreme pain or oozing puss.

## 2021-03-09 MED ORDER — HYDROCHLOROTHIAZIDE 12.5 MG PO CAPS: 12.5000 mg | ORAL_CAPSULE | Freq: Every day | ORAL | 0 refills | Status: DC | PRN

## 2021-03-18 ENCOUNTER — Other Ambulatory Visit: Payer: Self-pay | Admitting: Family Medicine

## 2021-03-23 ENCOUNTER — Other Ambulatory Visit: Payer: Self-pay

## 2021-03-23 ENCOUNTER — Ambulatory Visit (INDEPENDENT_AMBULATORY_CARE_PROVIDER_SITE_OTHER): Payer: BC Managed Care – PPO | Admitting: Family Medicine

## 2021-03-23 ENCOUNTER — Encounter (INDEPENDENT_AMBULATORY_CARE_PROVIDER_SITE_OTHER): Payer: Self-pay | Admitting: Family Medicine

## 2021-03-23 VITALS — BP 117/71 | HR 84 | Temp 97.9°F | Ht 65.0 in | Wt 292.0 lb

## 2021-03-23 DIAGNOSIS — E8881 Metabolic syndrome: Secondary | ICD-10-CM

## 2021-03-23 DIAGNOSIS — Z9189 Other specified personal risk factors, not elsewhere classified: Secondary | ICD-10-CM | POA: Diagnosis not present

## 2021-03-23 DIAGNOSIS — G4709 Other insomnia: Secondary | ICD-10-CM

## 2021-03-23 DIAGNOSIS — M25562 Pain in left knee: Secondary | ICD-10-CM

## 2021-03-23 DIAGNOSIS — R609 Edema, unspecified: Secondary | ICD-10-CM | POA: Diagnosis not present

## 2021-03-23 DIAGNOSIS — F411 Generalized anxiety disorder: Secondary | ICD-10-CM

## 2021-03-23 DIAGNOSIS — Z6841 Body Mass Index (BMI) 40.0 and over, adult: Secondary | ICD-10-CM

## 2021-03-23 DIAGNOSIS — G8929 Other chronic pain: Secondary | ICD-10-CM

## 2021-03-23 MED ORDER — OZEMPIC (1 MG/DOSE) 4 MG/3ML ~~LOC~~ SOPN: 1.0000 mg | PEN_INJECTOR | SUBCUTANEOUS | 0 refills | Status: DC

## 2021-03-23 MED ORDER — TRAZODONE HCL 100 MG PO TABS: 100.0000 mg | ORAL_TABLET | Freq: Every day | ORAL | 0 refills | Status: DC

## 2021-03-23 MED ORDER — HYDROCHLOROTHIAZIDE 25 MG PO TABS: 25.0000 mg | ORAL_TABLET | Freq: Every day | ORAL | 0 refills | Status: DC

## 2021-03-23 NOTE — Progress Notes (Signed)
Chief Complaint:   OBESITY [name] is here to discuss her progress with her obesity treatment plan along with follow-up of her obesity related diagnoses.   Today's visit was #: 12 Starting weight: 306 lbs Starting date: 05/19/2020 Today's weight: 292 lbs Today's date: 03/23/2021 Total lbs lost to date: 18 lbs Body mass index is 48.59 kg/m.  Total weight loss percentage to date: -4.58  Interim History: [name] is hoping to get a new job-will hear something back in a few weeks. She is not working during the summer. Her knee pain is improving and so she able to walk more. Her edema is also improving.  Current Meal Plan: keeping a food journal and adhering to recommended goals of 1500 calories and 100 grams of protein.  Current Exercise Plan: Walking for 15 minutes 1-2 times per week. Current Anti-Obesity Medications: Ozempic 1 mg. Side effects: None.  Assessment/Plan:   1. Chronic pain of left knee, followed by Sports Medicine, s/p Gelsyn injections [name] has received her third knee injection. She also got her foot inserts for her shoes.  2. Edema, unspecified type Improving, but not optimized. Recommendations: decrease sodium in the diet, elevate feet above the level of the heart whenever possible, increase physical activity, and use of compression stockings. Medication: Hydrochlorothiazide 12.5 mg. Increase to 25 mg daily  - Start hydrochlorothiazide (HYDRODIURIL) 25 MG tablet; Take 1 tablet (25 mg total) by mouth daily.  Dispense: 90 tablet; Refill: 0  3. Metabolic syndrome Starting goal: Lose 7-10% of starting weight. She will continue to focus on protein-rich, low simple carbohydrate foods. We reviewed the importance of hydration, regular exercise for stress reduction, and restorative sleep.  We will continue to check lab work every 3 months, with 10% weight loss, or should any other concerns arise. She has been off Ozempic for 1 week.  - RefillSemaglutide, 1 MG/DOSE,  (OZEMPIC, 1 MG/DOSE,) 4 MG/3ML SOPN; Inject 1 mg into the skin once a week.  Dispense: 9 mL; Refill: 0  4. Other insomnia Current treatment: Trazodone 50 mg daily.  Plan: Recommend sleep hygiene measures including regular sleep schedule, optimal sleep environment, and relaxing presleep rituals.  After discussion, patient would like to start below medication. Expectations, risks, and potential side effects reviewed.   - Start traZODone (DESYREL) 100 MG tablet; Take 1 tablet (100 mg total) by mouth at bedtime.  Dispense: 90 tablet; Refill: 0  5. GAD (generalized anxiety disorder) Behavior modification techniques were discussed today to help [name] deal with her anxiety.    - Ambulatory referral to Behavioral Health  6. At risk for activity intolerance [name] was given approximately 15 minutes of counseling today regarding her increased risk for exercise intolerance.  We discussed patient's specific personal and medical issues that raise our concern.  She was advised of strategies to prevent injury and ways to improve her cardiopulmonary fitness levels slowly over time.  We additionally discussed various fitness trackers and smart phone apps to help motivate the patient to stay on track.   7. Obesity, current BMI 48.7   Course: [name] is currently in the action stage of change. As such, her goal is to continue with weight loss efforts.   Nutrition goals: She has agreed to keeping a food journal and adhering to recommended goals of 1500 calories and 100 grams of protein.   Exercise goals: As is  Behavioral modification strategies: increasing lean protein intake, decreasing simple carbohydrates, increasing vegetables and increasing water intake.  [name] has agreed to  follow-up with our clinic in 4 weeks. She was informed of the importance of frequent follow-up visits to maximize her success with intensive lifestyle modifications for her multiple health conditions.   Objective:   Blood  pressure 117/71, pulse 84, temperature 97.9 F (36.6 C), temperature source Oral, height 5\' 5"  (1.651 m), weight 292 lb (132.5 kg), last menstrual period 11/07/2012, SpO2 95 %. Body mass index is 48.59 kg/m.  General: Cooperative, alert, well developed, in no acute distress. HEENT: Conjunctivae and lids unremarkable. Cardiovascular: Regular rhythm.  Lungs: Normal work of breathing. Neurologic: No focal deficits.   Lab Results  Component Value Date   CREATININE 0.84 02/22/2021   BUN 5 (L) 02/22/2021   NA 139 02/22/2021   K 4.5 02/22/2021   CL 98 02/22/2021   CO2 24 02/22/2021   Lab Results  Component Value Date   ALT 24 02/22/2021   AST 19 02/22/2021   ALKPHOS 114 02/22/2021   BILITOT 0.3 02/22/2021   Lab Results  Component Value Date   HGBA1C 5.5 02/22/2021   HGBA1C 5.9 (H) 09/12/2020   HGBA1C 5.7 (H) 05/13/2020   HGBA1C 5.4 02/25/2013   Lab Results  Component Value Date   INSULIN 40.6 (H) 09/12/2020   Lab Results  Component Value Date   TSH 1.750 05/13/2020   Lab Results  Component Value Date   CHOL 194 02/22/2021   HDL 43 02/22/2021   LDLCALC 125 (H) 02/22/2021   TRIG 146 02/22/2021   CHOLHDL 4.5 (H) 02/22/2021   Lab Results  Component Value Date   WBC 8.4 02/22/2021   HGB 13.5 02/22/2021   HCT 40.2 02/22/2021   MCV 90 02/22/2021   PLT 280 02/22/2021   Lab Results  Component Value Date   IRON 68 02/22/2021   TIBC 315 02/22/2021   FERRITIN 119 02/22/2021   Attestation Statements:   Reviewed by clinician on day of visit: allergies, medications, problem list, medical history, surgical history, family history, social history, and previous encounter notes.  02/24/2021 Friedenbach, CMA, am acting as [id number] for Energy manager, DO.  I have reviewed the above documentation for accuracy and completeness, and I agree with the above. [employer], DO

## 2021-03-28 ENCOUNTER — Encounter (INDEPENDENT_AMBULATORY_CARE_PROVIDER_SITE_OTHER): Payer: Self-pay | Admitting: Family Medicine

## 2021-04-20 ENCOUNTER — Other Ambulatory Visit: Payer: Self-pay

## 2021-04-20 ENCOUNTER — Encounter (INDEPENDENT_AMBULATORY_CARE_PROVIDER_SITE_OTHER): Payer: Self-pay | Admitting: Family Medicine

## 2021-04-20 ENCOUNTER — Ambulatory Visit (INDEPENDENT_AMBULATORY_CARE_PROVIDER_SITE_OTHER): Payer: BC Managed Care – PPO | Admitting: Family Medicine

## 2021-04-20 VITALS — BP 119/72 | HR 65 | Temp 98.0°F | Ht 65.0 in | Wt 295.0 lb

## 2021-04-20 DIAGNOSIS — E8881 Metabolic syndrome: Secondary | ICD-10-CM

## 2021-04-20 DIAGNOSIS — F411 Generalized anxiety disorder: Secondary | ICD-10-CM | POA: Diagnosis not present

## 2021-04-20 DIAGNOSIS — Z6841 Body Mass Index (BMI) 40.0 and over, adult: Secondary | ICD-10-CM

## 2021-04-20 DIAGNOSIS — Z9189 Other specified personal risk factors, not elsewhere classified: Secondary | ICD-10-CM | POA: Diagnosis not present

## 2021-04-20 DIAGNOSIS — R7303 Prediabetes: Secondary | ICD-10-CM | POA: Diagnosis not present

## 2021-04-27 NOTE — Progress Notes (Signed)
Chief Complaint:   OBESITY [name] is here to discuss her progress with her obesity treatment plan along with follow-up of her obesity related diagnoses.   Today's visit was #: 13 Starting weight: 306 lbs Starting date: 05/19/2020 Today's weight: 295 lbs Today's date: 04/20/2021 Weight change since last visit: +3 lbs Total lbs lost to date: 11 lbs Body mass index is 49.09 kg/m.  Total weight loss percentage to date: -3.59%  Interim History: Linzey says she has been under a lot of stress lately.  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  She is hopeful, but ready for therapy.  Current Meal Plan: keeping a food journal and adhering to recommended goals of 1500 calories and 100 grams of protein for 0% of the time.  Current Exercise Plan: None. Current Anti-Obesity Medications: Ozempic 1 mg subcutaneously weekly. Side effects: None.  Assessment/Plan:   Orders Placed This Encounter  Procedures   Ambulatory referral to Behavioral Health   1. Metabolic syndrome Starting goal: Lose 7-10% of starting weight. She will continue to focus on protein-rich, low simple carbohydrate foods. We reviewed the importance of hydration, regular exercise for stress reduction, and restorative sleep.  We will continue to check lab work every 3 months, with 10% weight loss, or should any other concerns arise.  2. Prediabetes At goal. Goal is HgbA1c < 5.7.  Medication: Ozempic 1 mg subcutaneously weekly.    Plan:  Continue Ozempic at current dose.  She will continue to focus on protein-rich, low simple carbohydrate foods. We reviewed the importance of hydration, regular exercise for stress reduction, and restorative sleep.   Lab Results  Component Value Date   HGBA1C 5.5 02/22/2021   Lab Results  Component Value Date   INSULIN 40.6 (H) 09/12/2020   3. GAD (generalized anxiety disorder) Aniyiah has been under increased stress.  Will place referral, again, to [facility]  today in order for her to gets some counseling/therapy.    Note:  She asked for a new referral and understands that she must answer/call back.  - Ambulatory referral to Behavioral Health  4. At risk for heart disease Due to Naylene's current state of health and medical condition(s), she is at a higher risk for heart disease.  This puts the patient at much greater risk to subsequently develop cardiopulmonary conditions that can significantly affect patient's quality of life in a negative manner.    At least 8 minutes were spent on counseling Shatoya about these concerns today. Evidence-based interventions for health behavior change were utilized today including the discussion of self monitoring techniques, problem-solving barriers, and SMART goal setting techniques.  Specifically, regarding patient's less desirable eating habits and patterns, we employed the technique of small changes when Reannon has not been able to fully commit to her prudent nutritional plan.  5. Obesity, current BMI 49.2  Course: Noora is currently in the action stage of change. As such, her goal is to continue with weight loss efforts.   Nutrition goals: She has agreed to keeping a food journal and adhering to recommended goals of 1500 calories and 100 grams of protein.   Exercise goals: All adults should avoid inactivity. Some physical activity is better than none, and adults who participate in any amount of physical activity gain some health benefits.  Behavioral modification strategies: increasing lean protein intake, decreasing simple carbohydrates, increasing vegetables, and increasing water intake.  Akayla has agreed to follow-up with our clinic in 4 weeks.  She was informed of the importance of frequent follow-up visits to maximize her success with intensive lifestyle modifications for her multiple health conditions.   Objective:   Blood pressure 119/72, pulse 65, temperature 98 F (36.7 C), temperature source  Oral, height 5\' 5"  (1.651 m), weight 295 lb (133.8 kg), last menstrual period 11/07/2012, SpO2 96 %. Body mass index is 49.09 kg/m.  General: Cooperative, alert, well developed, in no acute distress. HEENT: Conjunctivae and lids unremarkable. Cardiovascular: Regular rhythm.  Lungs: Normal work of breathing. Neurologic: No focal deficits.   Lab Results  Component Value Date   CREATININE 0.84 02/22/2021   BUN 5 (L) 02/22/2021   NA 139 02/22/2021   K 4.5 02/22/2021   CL 98 02/22/2021   CO2 24 02/22/2021   Lab Results  Component Value Date   ALT 24 02/22/2021   AST 19 02/22/2021   ALKPHOS 114 02/22/2021   BILITOT 0.3 02/22/2021   Lab Results  Component Value Date   HGBA1C 5.5 02/22/2021   HGBA1C 5.9 (H) 09/12/2020   HGBA1C 5.7 (H) 05/13/2020   HGBA1C 5.4 02/25/2013   Lab Results  Component Value Date   INSULIN 40.6 (H) 09/12/2020   Lab Results  Component Value Date   TSH 1.750 05/13/2020   Lab Results  Component Value Date   CHOL 194 02/22/2021   HDL 43 02/22/2021   LDLCALC 125 (H) 02/22/2021   TRIG 146 02/22/2021   CHOLHDL 4.5 (H) 02/22/2021   Lab Results  Component Value Date   VD25OH 51.5 02/22/2021   VD25OH 24.4 (L) 09/12/2020   VD25OH 18.3 (L) 05/19/2020   Lab Results  Component Value Date   WBC 8.4 02/22/2021   HGB 13.5 02/22/2021   HCT 40.2 02/22/2021   MCV 90 02/22/2021   PLT 280 02/22/2021   Lab Results  Component Value Date   IRON 68 02/22/2021   TIBC 315 02/22/2021   FERRITIN 119 02/22/2021   Attestation Statements:   Reviewed by clinician on day of visit: allergies, medications, problem list, medical history, surgical history, family history, social history, and previous encounter notes.  I, 02/24/2021, CMA, am acting as transcriptionist for Insurance claims handler, DO  I have reviewed the above documentation for accuracy and completeness, and I agree with the above. Helane Rima, DO

## 2021-04-30 ENCOUNTER — Encounter: Payer: Self-pay | Admitting: Physician Assistant

## 2021-04-30 ENCOUNTER — Telehealth: Payer: BC Managed Care – PPO | Admitting: Physician Assistant

## 2021-04-30 DIAGNOSIS — Z6841 Body Mass Index (BMI) 40.0 and over, adult: Secondary | ICD-10-CM

## 2021-04-30 DIAGNOSIS — U071 COVID-19: Secondary | ICD-10-CM

## 2021-04-30 MED ORDER — MOLNUPIRAVIR EUA 200MG CAPSULE: 4.0000 | ORAL_CAPSULE | Freq: Two times a day (BID) | ORAL | 0 refills | Status: AC

## 2021-04-30 NOTE — Patient Instructions (Addendum)
Hello [place],  You are being placed in the home monitoring program for COVID-19 (commonly known as Coronavirus).  This is because you are suspected to have the virus or are known to have the virus.  If you are unsure which group you fall into call your clinic.    As part of this program, you'll answer a daily questionnaire in the MyChart mobile app. You'll receive a notification through the MyChart app when the questionnaire is available. When you log in to MyChart, you'll see the tasks in your To Do activity.       Clinicians will see any answers that are concerning and take appropriate steps.  If at any point you are having a medical emergency, call 911.  If otherwise concerned call your clinic instead of coming into the clinic or hospital.  To keep from spreading the disease you should: Stay home and limit contact with other people as much as possible.  Wash your hands frequently. Cover your coughs and sneezes with a tissue, and throw used tissues in the trash.   Clean and disinfect frequently touched surfaces and objects.    Take care of yourself by: Staying home Resting Drinking fluids Take fever-reducing medications (Tylenol/Acetaminophen and Ibuprofen)  For more information on the disease go to the Centers for Disease Control and Prevention website     You are being prescribed MOLNUPIRAVIR for COVID-19 infection.   Please call the pharmacy or go through the drive through vs going inside if you are picking up the mediation yourself to prevent further spread. If prescribed to a [facility] affiliated pharmacy, a pharmacist will bring the medication out to your car.   ADMINISTRATION INSTRUCTIONS: Take with or without food. Swallow the tablets whole. Don't chew, crush, or break the medications because it might not work as well  For each dose of the medication, you should be taking FOUR tablets at one time, TWICE a day   Finish your full five-day course of Molnupiravir even if  you feel better before you're done. Stopping this medication too early can make it less effective to prevent severe illness related to COVID19.    Molnupiravir is prescribed for YOU ONLY. Don't share it with others, even if they have similar symptoms as you. This medication might not be right for everyone.   Make sure to take steps to protect yourself and others while you're taking this medication in order to get well soon and to prevent others from getting sick with COVID-19.   **If you are of childbearing potential (any gender) - it is advised to not get pregnant while taking this medication and recommended that condoms are used for female partners the next 3 months after taking the medication out of extreme caution    COMMON SIDE EFFECTS: Diarrhea Nausea  Dizziness    If your COVID-19 symptoms get worse, get medical help right away. Call 911 if you experience symptoms such as worsening cough, trouble breathing, chest pain that doesn't go away, confusion, a hard time staying awake, and pale or blue-colored skin. This medication won't prevent all COVID-19 cases from getting worse.   Can take to lessen severity: Vit C 500mg  twice daily Quercertin 250-500mg  twice daily Zinc 75-100mg  daily Melatonin 3-6 mg at bedtime Vit D3 1000-2000 IU daily Aspirin 81 mg daily with food Optional: Famotidine 20mg  daily Also can add tylenol/ibuprofen as needed for fevers and body aches May add Mucinex or Mucinex DM as needed for cough/congestion  10 Things You Can Do  to Manage Your COVID-19 Symptoms at Home If you have possible or confirmed COVID-19 Stay home except to get medical care. Monitor your symptoms carefully. If your symptoms get worse, call your healthcare provider immediately. Get rest and stay hydrated. If you have a medical appointment, call the healthcare provider ahead of time and tell them that you have or may have COVID-19. For medical emergencies, call 911 and notify the dispatch  personnel that you have or may have COVID-19. Cover your cough and sneezes with a tissue or use the inside of your elbow. Wash your hands often with soap and water for at least 20 seconds or clean your hands with an alcohol-based hand sanitizer that contains at least 60% alcohol. As much as possible, stay in a specific room and away from other people in your home. Also, you should use a separate bathroom, if available. If you need to be around other people in or outside of the home, wear a mask. Avoid sharing personal items with other people in your household, like dishes, towels, and bedding. Clean all surfaces that are touched often, like counters, tabletops, and doorknobs. Use household cleaning sprays or wipes according to the label instructions. [email or web address] 05/13/2020 This information is not intended to replace advice given to you by your health care provider. Make sure you discuss any questions you have with your healthcare provider. Document Revised: 12/02/2020 Document Reviewed: 12/02/2020 Elsevier Patient Education  [place].

## 2021-04-30 NOTE — Progress Notes (Addendum)
[name], [name] are scheduled for a virtual visit with your provider today.    Just as we do with appointments in the office, we must obtain your consent to participate.  Your consent will be active for this visit and any virtual visit you may have with one of our providers in the next 365 days.    If you have a MyChart account, I can also send a copy of this consent to you electronically.  All virtual visits are billed to your insurance company just like a traditional visit in the office.  As this is a virtual visit, video technology does not allow for your provider to perform a traditional examination.  This may limit your provider's ability to fully assess your condition.  If your provider identifies any concerns that need to be evaluated in person or the need to arrange testing such as labs, EKG, etc, we will make arrangements to do so.    Although advances in technology are sophisticated, we cannot ensure that it will always work on either your end or our end.  If the connection with a video visit is poor, we may have to switch to a telephone visit.  With either a video or telephone visit, we are not always able to ensure that we have a secure connection.   I need to obtain your verbal consent now.   Are you willing to proceed with your visit today?   ZYNIA WOJTOWICZ has provided verbal consent on 04/30/2021 for a virtual visit (video or telephone).   Margaretann Loveless, PA-C 04/30/2021  2:22 PM  Virtual Visit Consent   [name], you are scheduled for a virtual visit with a [facility] provider today.     Just as with appointments in the office, your consent must be obtained to participate.  Your consent will be active for this visit and any virtual visit you may have with one of our providers in the next 365 days.     If you have a MyChart account, a copy of this consent can be sent to you electronically.  All virtual visits are billed to your insurance company just like a traditional  visit in the office.    As this is a virtual visit, video technology does not allow for your provider to perform a traditional examination.  This may limit your provider's ability to fully assess your condition.  If your provider identifies any concerns that need to be evaluated in person or the need to arrange testing (such as labs, EKG, etc.), we will make arrangements to do so.     Although advances in technology are sophisticated, we cannot ensure that it will always work on either your end or our end.  If the connection with a video visit is poor, the visit may have to be switched to a telephone visit.  With either a video or telephone visit, we are not always able to ensure that we have a secure connection.     I need to obtain your verbal consent now.   Are you willing to proceed with your visit today?    [name] has provided verbal consent on 04/30/2021 for a virtual visit (video or telephone).   Margaretann Loveless, PA-C   Date: 04/30/2021 2:22 PM   Virtual Visit via Video Note   I, Margaretann Loveless, connected with  [name]  ([id number], [birth year]) on 04/30/21 at  2:00 PM EDT by a video-enabled telemedicine application  and verified that I am speaking with the correct person using two identifiers.  Location: Patient: Virtual Visit Location Patient: Home Provider: Virtual Visit Location Provider: Home Office   I discussed the limitations of evaluation and management by telemedicine and the availability of in person appointments. The patient expressed understanding and agreed to proceed.    History of Present Illness: [name] is a 45 y.o. who identifies as a female who was assigned female at birth, and is being seen today for covid 19, tested positive last night, 04/29/21.  HPI: URI  This is a new problem. The current episode started in the past 7 days (started on 04/28/21). The problem has been unchanged. There has been no fever. Associated symptoms include  chest pain (tightness not pain), congestion, coughing (semi productive), headaches, a plugged ear sensation, rhinorrhea and sinus pain. Pertinent negatives include no diarrhea, ear pain, nausea, sore throat or wheezing. Associated symptoms comments: Shortness of breath,night sweats, chills, fatigue. She has tried acetaminophen and NSAIDs (tessalon, albuterol) for the symptoms. The treatment provided no relief.   Problems:  Patient Active Problem List   Diagnosis Date Noted   Chronic pain of right knee 01/11/2021   Primary osteoarthritis of right knee 01/11/2021   Vitamin D deficiency 10/06/2020   Prediabetes 10/06/2020   Anxiety, with emotional eating 10/06/2020   Mixed hyperlipidemia 10/06/2020   Situational anxiety 09/08/2020   Migraine without aura and without status migrainosus, not intractable 09/08/2020   Mild intermittent asthma without complication 05/24/2020   OSA on CPAP 07/09/2019   Degenerative scoliosis in adult patient 05/11/2019   Anxiety and depression 04/08/2019   Fibromyalgia 04/08/2019   Primary osteoarthritis of both knees 09/17/2018   Primary osteoarthritis of both hands 09/17/2018   DDD (degenerative disc disease), lumbar 08/27/2018   Multiple food allergies 07/28/2018   Iron deficiency anemia 02/07/2018   Prolapsed lumbar disc 12/27/2017   Major depression, recurrent, chronic (HCC) 01/22/2011   Morbid obesity with BMI of 50.0-59.9, adult (HCC) 02/13/2010    Allergies:  Allergies  Allergen Reactions   Cashew Nut Oil Anaphylaxis   Cauliflower [Brassica Oleracea] Anaphylaxis   Whey Anaphylaxis   Augmentin [Amoxicillin-Pot Clavulanate] Diarrhea and Nausea And Vomiting    Has patient had a PCN reaction causing immediate rash, facial/tongue/throat swelling, SOB or lightheadedness with hypotension: No Has patient had a PCN reaction causing severe rash involving mucus membranes or skin necrosis: No Has patient had a PCN reaction that required hospitalization: No Has  patient had a PCN reaction occurring within the last 10 years: Unknown If all of the above answers are "NO", then may proceed with Cephalosporin use.    Orange Fruit [Citrus] Other (See Comments)    Migraines    Wheat Bran Diarrhea   Gluten Meal Other (See Comments)    Severe GI upset   Milk-Related Compounds    Other     epidural steroid injection - severe headaches, high fever   Codeine Rash   Tree Extract Itching    Tee tree oil   Medications:  Current Outpatient Medications:    molnupiravir EUA 200 mg CAPS, Take 4 capsules (800 mg total) by mouth 2 (two) times daily for 5 days., Disp: 40 capsule, Rfl: 0   albuterol (VENTOLIN HFA) 108 (90 Base) MCG/ACT inhaler, Inhale 2 puffs into the lungs every 6 (six) hours as needed for wheezing., Disp: , Rfl:    busPIRone (BUSPAR) 10 MG tablet, TAKE 1 TABLET(10 MG) BY MOUTH THREE TIMES DAILY, Disp:  90 tablet, Rfl: 1   butalbital-acetaminophen-caffeine (FIORICET) 50-325-40 MG tablet, Take 1 tablet by mouth every 6 (six) hours as needed for headache., Disp: 30 tablet, Rfl: 0   Cyanocobalamin (B-12 PO), Take by mouth., Disp: , Rfl:    DULoxetine (CYMBALTA) 60 MG capsule, Take 1 capsule (60 mg total) by mouth daily., Disp: 90 capsule, Rfl: 3   EPINEPHrine (EPIPEN) 0.3 mg/0.3 mL DEVI, Inject 0.3 mLs (0.3 mg total) into the muscle once., Disp: 1 Device, Rfl: 0   gabapentin (NEURONTIN) 300 MG capsule, TAKE 1 CAPSULE(300 MG) BY MOUTH THREE TIMES DAILY, Disp: 90 capsule, Rfl: 0   hydrochlorothiazide (HYDRODIURIL) 25 MG tablet, Take 1 tablet (25 mg total) by mouth daily., Disp: 90 tablet, Rfl: 0   loratadine (CLARITIN) 10 MG tablet, Take 10 mg by mouth daily., Disp: , Rfl:    propranolol (INDERAL) 20 MG tablet, Take 1 tablet (20 mg total) by mouth 3 (three) times daily as needed (anxiety)., Disp: 30 tablet, Rfl: 0   Semaglutide, 1 MG/DOSE, (OZEMPIC, 1 MG/DOSE,) 4 MG/3ML SOPN, Inject 1 mg into the skin once a week., Disp: 9 mL, Rfl: 0   traZODone (DESYREL)  100 MG tablet, Take 1 tablet (100 mg total) by mouth at bedtime., Disp: 90 tablet, Rfl: 0   Vitamin D, Ergocalciferol, (DRISDOL) 1.25 MG (50000 UNIT) CAPS capsule, Take 1 capsule (50,000 Units total) by mouth 2 (two) times a week., Disp: 24 capsule, Rfl: 0  Observations/Objective: Patient is well-developed, well-nourished in no acute distress.  Resting comfortably at home.  Head is normocephalic, atraumatic.  No labored breathing. Speech is clear and coherent with logical content.  Patient is alert and oriented at baseline.   Assessment and Plan: 1. COVID-19 - MyChart COVID-19 home monitoring program; Future - molnupiravir EUA 200 mg CAPS; Take 4 capsules (800 mg total) by mouth 2 (two) times daily for 5 days.  Dispense: 40 capsule; Refill: 0 - Temperature monitoring; Future  2. Class 3 severe obesity due to excess calories with serious comorbidity and body mass index (BMI) of 45.0 to 49.9 in adult (HCC) - molnupiravir EUA 200 mg CAPS; Take 4 capsules (800 mg total) by mouth 2 (two) times daily for 5 days.  Dispense: 40 capsule; Refill: 0 - Continue OTC symptomatic management of choice - Will send OTC vitamins and supplement information through AVS - Molnupiravir prescribed for patient - Patient enrolled in MyChart symptom monitoring - Push fluids - Rest as needed - Discussed return precautions and when to seek in-person evaluation, sent via AVS as well   Follow Up Instructions: I discussed the assessment and treatment plan with the patient. The patient was provided an opportunity to ask questions and all were answered. The patient agreed with the plan and demonstrated an understanding of the instructions.  A copy of instructions were sent to the patient via MyChart.  The patient was advised to call back or seek an in-person evaluation if the symptoms worsen or if the condition fails to improve as anticipated.  Time:  I spent 17 minutes with the patient via telehealth technology  discussing the above problems/concerns.    Margaretann Loveless, PA-C

## 2021-05-03 ENCOUNTER — Encounter: Payer: BC Managed Care – PPO | Admitting: Family Medicine

## 2021-05-13 ENCOUNTER — Other Ambulatory Visit: Payer: Self-pay | Admitting: Family Medicine

## 2021-05-30 ENCOUNTER — Ambulatory Visit (INDEPENDENT_AMBULATORY_CARE_PROVIDER_SITE_OTHER): Payer: BC Managed Care – PPO | Admitting: Family Medicine

## 2021-06-01 ENCOUNTER — Encounter (INDEPENDENT_AMBULATORY_CARE_PROVIDER_SITE_OTHER): Payer: Self-pay | Admitting: Family Medicine

## 2021-06-01 ENCOUNTER — Ambulatory Visit (INDEPENDENT_AMBULATORY_CARE_PROVIDER_SITE_OTHER): Payer: BC Managed Care – PPO | Admitting: Family Medicine

## 2021-06-01 ENCOUNTER — Other Ambulatory Visit: Payer: Self-pay

## 2021-06-01 VITALS — BP 125/76 | HR 81 | Temp 97.9°F | Ht 65.0 in | Wt 296.0 lb

## 2021-06-01 DIAGNOSIS — E8881 Metabolic syndrome: Secondary | ICD-10-CM

## 2021-06-01 DIAGNOSIS — Z9189 Other specified personal risk factors, not elsewhere classified: Secondary | ICD-10-CM

## 2021-06-01 DIAGNOSIS — Z6841 Body Mass Index (BMI) 40.0 and over, adult: Secondary | ICD-10-CM

## 2021-06-01 DIAGNOSIS — E559 Vitamin D deficiency, unspecified: Secondary | ICD-10-CM | POA: Diagnosis not present

## 2021-06-01 DIAGNOSIS — F411 Generalized anxiety disorder: Secondary | ICD-10-CM

## 2021-06-02 ENCOUNTER — Encounter: Payer: BC Managed Care – PPO | Admitting: Family Medicine

## 2021-06-02 ENCOUNTER — Ambulatory Visit (INDEPENDENT_AMBULATORY_CARE_PROVIDER_SITE_OTHER): Payer: BC Managed Care – PPO | Admitting: Psychology

## 2021-06-02 DIAGNOSIS — F4322 Adjustment disorder with anxiety: Secondary | ICD-10-CM | POA: Diagnosis not present

## 2021-06-06 MED ORDER — VITAMIN D (ERGOCALCIFEROL) 1.25 MG (50000 UNIT) PO CAPS: 50000.0000 [IU] | ORAL_CAPSULE | ORAL | 0 refills | Status: DC

## 2021-06-06 NOTE — Progress Notes (Signed)
Chief Complaint:   OBESITY [name] is here to discuss her progress with her obesity treatment plan along with follow-up of her obesity related diagnoses.   Today's visit was #: 14 Starting weight: 306 lbs Starting date: 05/19/2020 Today's weight: 296 lbs Today's date: 06/01/2021 Weight change since last visit: +1 lb Total lbs lost to date: 10 lbs Body mass index is 49.26 kg/m.  Total weight loss percentage to date: -3.27%  Interim History:  [name] says she is starting with a Systems analyst, [name], at [employer].  She likes Kind bars as snack options at work.  She has a salad with chicken for lunch.  She says she has a great work environment - but is more sedentary.   Current Meal Plan: keeping a food journal and adhering to recommended goals of 1500 calories and 100 grams of protein for 50% of the time.  Current Exercise Plan: Cardio and strength training for 20-30 minutes 2 times per week. Current Anti-Obesity Medications: Ozempic 0.5 mg subcutaneously weekly. Side effects: None.  Assessment/Plan:   1. Metabolic syndrome Starting goal: Lose 7-10% of starting weight. She will continue to focus on protein-rich, low simple carbohydrate foods. We reviewed the importance of hydration, regular exercise for stress reduction, and restorative sleep.  We will continue to check lab work every 3 months, with 10% weight loss, or should any other concerns arise.  She is still on Ozempic 0.5 mg subcutaneously weekly (stopped during COVID).  2. Vitamin D deficiency At goal.  She is taking vitamin D 50,000 IU weekly.  Plan: Continue to take prescription Vitamin D @50 ,000 IU every week as prescribed.  Follow-up for routine testing of Vitamin D, at least 2-3 times per year to avoid over-replacement.  Lab Results  Component Value Date   VD25OH 51.5 02/22/2021   VD25OH 24.4 (L) 09/12/2020   VD25OH 18.3 (L) 05/19/2020   - Refill Vitamin D, Ergocalciferol, (DRISDOL) 1.25 MG  (50000 UNIT) CAPS capsule; Take 1 capsule (50,000 Units total) by mouth 2 (two) times a week.  Dispense: 24 capsule; Refill: 0  3. GAD (generalized anxiety disorder) She will be seeing 05/21/2020 for therapy tomorrow. We will continue to monitor symptoms as they relate to her weight loss journey.  4. At risk for heart disease Due to [name]'s current state of health and medical condition(s), she is at a higher risk for heart disease.  This puts the patient at much greater risk to subsequently develop cardiopulmonary conditions that can significantly affect patient's quality of life in a negative manner.    At least 8 minutes were spent on counseling [name] about these concerns today. Evidence-based interventions for health behavior change were utilized today including the discussion of self monitoring techniques, problem-solving barriers, and SMART goal setting techniques.  Specifically, regarding patient's less desirable eating habits and patterns, we employed the technique of small changes when [name] has not been able to fully commit to her prudent nutritional plan.  5. Obesity, current BMI 49.3  Course: [name] is currently in the action stage of change. As such, her goal is to continue with weight loss efforts.   Nutrition goals: She has agreed to keeping a food journal and adhering to recommended goals of 1500 calories and 100 grams of protein.   Exercise goals:  As is.  Behavioral modification strategies: increasing lean protein intake, decreasing simple carbohydrates, increasing vegetables, and increasing water intake.  [name] has agreed to follow-up with our clinic in 4 weeks. She  was informed of the importance of frequent follow-up visits to maximize her success with intensive lifestyle modifications for her multiple health conditions.   Objective:   Blood pressure 125/76, pulse 81, temperature 97.9 F (36.6 C), temperature source Oral, height 5\' 5"  (1.651 m), weight 296 lb  (134.3 kg), last menstrual period 11/07/2012, SpO2 97 %. Body mass index is 49.26 kg/m.  General: Cooperative, alert, well developed, in no acute distress. HEENT: Conjunctivae and lids unremarkable. Cardiovascular: Regular rhythm.  Lungs: Normal work of breathing. Neurologic: No focal deficits.   Lab Results  Component Value Date   CREATININE 0.84 02/22/2021   BUN 5 (L) 02/22/2021   NA 139 02/22/2021   K 4.5 02/22/2021   CL 98 02/22/2021   CO2 24 02/22/2021   Lab Results  Component Value Date   ALT 24 02/22/2021   AST 19 02/22/2021   ALKPHOS 114 02/22/2021   BILITOT 0.3 02/22/2021   Lab Results  Component Value Date   HGBA1C 5.5 02/22/2021   HGBA1C 5.9 (H) 09/12/2020   HGBA1C 5.7 (H) 05/13/2020   HGBA1C 5.4 02/25/2013   Lab Results  Component Value Date   INSULIN 40.6 (H) 09/12/2020   Lab Results  Component Value Date   TSH 1.750 05/13/2020   Lab Results  Component Value Date   CHOL 194 02/22/2021   HDL 43 02/22/2021   LDLCALC 125 (H) 02/22/2021   TRIG 146 02/22/2021   CHOLHDL 4.5 (H) 02/22/2021   Lab Results  Component Value Date   VD25OH 51.5 02/22/2021   VD25OH 24.4 (L) 09/12/2020   VD25OH 18.3 (L) 05/19/2020   Lab Results  Component Value Date   WBC 8.4 02/22/2021   HGB 13.5 02/22/2021   HCT 40.2 02/22/2021   MCV 90 02/22/2021   PLT 280 02/22/2021   Lab Results  Component Value Date   IRON 68 02/22/2021   TIBC 315 02/22/2021   FERRITIN 119 02/22/2021   Attestation Statements:   Reviewed by clinician on day of visit: allergies, medications, problem list, medical history, surgical history, family history, social history, and previous encounter notes.  I, 02/24/2021, CMA, am acting as transcriptionist for Insurance claims handler, DO  I have reviewed the above documentation for accuracy and completeness, and I agree with the above. Helane Rima, DO

## 2021-06-09 ENCOUNTER — Encounter: Payer: BC Managed Care – PPO | Admitting: Nurse Practitioner

## 2021-06-16 ENCOUNTER — Ambulatory Visit: Payer: BC Managed Care – PPO | Admitting: Psychology

## 2021-06-19 ENCOUNTER — Other Ambulatory Visit: Payer: Self-pay

## 2021-06-19 MED ORDER — GABAPENTIN 300 MG PO CAPS: ORAL_CAPSULE | ORAL | 0 refills | Status: DC

## 2021-06-22 ENCOUNTER — Ambulatory Visit (INDEPENDENT_AMBULATORY_CARE_PROVIDER_SITE_OTHER): Payer: BC Managed Care – PPO | Admitting: Psychology

## 2021-06-22 DIAGNOSIS — F320 Major depressive disorder, single episode, mild: Secondary | ICD-10-CM

## 2021-06-22 DIAGNOSIS — F4322 Adjustment disorder with anxiety: Secondary | ICD-10-CM | POA: Diagnosis not present

## 2021-06-29 ENCOUNTER — Ambulatory Visit (INDEPENDENT_AMBULATORY_CARE_PROVIDER_SITE_OTHER): Payer: BC Managed Care – PPO | Admitting: Family Medicine

## 2021-06-29 ENCOUNTER — Encounter (INDEPENDENT_AMBULATORY_CARE_PROVIDER_SITE_OTHER): Payer: Self-pay | Admitting: Family Medicine

## 2021-06-29 NOTE — Telephone Encounter (Signed)
Dr.Wallace saw pt last. 

## 2021-07-04 ENCOUNTER — Other Ambulatory Visit: Payer: Self-pay

## 2021-07-04 DIAGNOSIS — G43009 Migraine without aura, not intractable, without status migrainosus: Secondary | ICD-10-CM

## 2021-07-04 MED ORDER — BUTALBITAL-APAP-CAFFEINE 50-325-40 MG PO TABS: 1.0000 | ORAL_TABLET | Freq: Four times a day (QID) | ORAL | 0 refills | Status: DC | PRN

## 2021-07-07 ENCOUNTER — Ambulatory Visit (INDEPENDENT_AMBULATORY_CARE_PROVIDER_SITE_OTHER): Payer: BC Managed Care – PPO | Admitting: Psychology

## 2021-07-07 DIAGNOSIS — F4322 Adjustment disorder with anxiety: Secondary | ICD-10-CM | POA: Diagnosis not present

## 2021-07-07 DIAGNOSIS — F320 Major depressive disorder, single episode, mild: Secondary | ICD-10-CM

## 2021-07-10 NOTE — Telephone Encounter (Signed)
LAST APPOINTMENT DATE: 06/01/2021 NEXT APPOINTMENT DATE: 07/26/2021   [phone] - [place], [place] - [address] AT [facility] [address] [place] [address] Phone: [phone] Fax: [phone]  Patient is requesting a refill of the following medications: No prescriptions requested or ordered in this encounter   Date last filled: 03/23/2021 Previously prescribed by Dr Earlene Plater  Lab Results      Component                Value               Date                      HGBA1C                   5.5                 02/22/2021                HGBA1C                   5.9 (H)             09/12/2020                HGBA1C                   5.7 (H)             05/13/2020           Lab Results      Component                Value               Date                      LDLCALC                  125 (H)             02/22/2021                CREATININE               0.84                02/22/2021           Lab Results      Component                Value               Date                      VD25OH                   51.5                02/22/2021                VD25OH                   24.4 (L)            09/12/2020                VD25OH  18.3 (L)            05/19/2020            BP Readings from Last 3 Encounters: 06/01/21 : 125/76 04/20/21 : 119/72 03/23/21 : 117/71

## 2021-07-11 ENCOUNTER — Other Ambulatory Visit (INDEPENDENT_AMBULATORY_CARE_PROVIDER_SITE_OTHER): Payer: Self-pay

## 2021-07-11 DIAGNOSIS — R609 Edema, unspecified: Secondary | ICD-10-CM

## 2021-07-11 MED ORDER — HYDROCHLOROTHIAZIDE 25 MG PO TABS: 25.0000 mg | ORAL_TABLET | Freq: Every day | ORAL | 0 refills | Status: DC

## 2021-07-20 ENCOUNTER — Other Ambulatory Visit: Payer: Self-pay

## 2021-07-20 MED ORDER — GABAPENTIN 300 MG PO CAPS: ORAL_CAPSULE | ORAL | 0 refills | Status: DC

## 2021-07-21 ENCOUNTER — Ambulatory Visit (INDEPENDENT_AMBULATORY_CARE_PROVIDER_SITE_OTHER): Payer: BC Managed Care – PPO | Admitting: Psychology

## 2021-07-21 DIAGNOSIS — F4322 Adjustment disorder with anxiety: Secondary | ICD-10-CM | POA: Diagnosis not present

## 2021-07-21 DIAGNOSIS — F320 Major depressive disorder, single episode, mild: Secondary | ICD-10-CM | POA: Diagnosis not present

## 2021-07-26 ENCOUNTER — Ambulatory Visit (INDEPENDENT_AMBULATORY_CARE_PROVIDER_SITE_OTHER): Payer: BC Managed Care – PPO | Admitting: Family Medicine

## 2021-07-26 ENCOUNTER — Other Ambulatory Visit: Payer: Self-pay

## 2021-07-26 ENCOUNTER — Encounter (INDEPENDENT_AMBULATORY_CARE_PROVIDER_SITE_OTHER): Payer: Self-pay | Admitting: Family Medicine

## 2021-07-26 VITALS — BP 115/76 | HR 77 | Temp 97.9°F | Ht 65.0 in | Wt 291.0 lb

## 2021-07-26 DIAGNOSIS — E782 Mixed hyperlipidemia: Secondary | ICD-10-CM | POA: Diagnosis not present

## 2021-07-26 DIAGNOSIS — Z9189 Other specified personal risk factors, not elsewhere classified: Secondary | ICD-10-CM

## 2021-07-26 DIAGNOSIS — R7301 Impaired fasting glucose: Secondary | ICD-10-CM

## 2021-07-26 DIAGNOSIS — E559 Vitamin D deficiency, unspecified: Secondary | ICD-10-CM | POA: Diagnosis not present

## 2021-07-26 DIAGNOSIS — Z6841 Body Mass Index (BMI) 40.0 and over, adult: Secondary | ICD-10-CM

## 2021-07-26 DIAGNOSIS — G8929 Other chronic pain: Secondary | ICD-10-CM

## 2021-07-26 DIAGNOSIS — R609 Edema, unspecified: Secondary | ICD-10-CM | POA: Diagnosis not present

## 2021-07-26 DIAGNOSIS — G43009 Migraine without aura, not intractable, without status migrainosus: Secondary | ICD-10-CM

## 2021-07-26 DIAGNOSIS — F411 Generalized anxiety disorder: Secondary | ICD-10-CM

## 2021-07-26 DIAGNOSIS — M25561 Pain in right knee: Secondary | ICD-10-CM

## 2021-07-26 DIAGNOSIS — G4709 Other insomnia: Secondary | ICD-10-CM

## 2021-07-26 DIAGNOSIS — Z862 Personal history of diseases of the blood and blood-forming organs and certain disorders involving the immune mechanism: Secondary | ICD-10-CM

## 2021-07-26 MED ORDER — TIRZEPATIDE 2.5 MG/0.5ML ~~LOC~~ SOAJ: 2.5000 mg | SUBCUTANEOUS | 1 refills | Status: DC

## 2021-07-26 MED ORDER — GABAPENTIN 300 MG PO CAPS: 300.0000 mg | ORAL_CAPSULE | Freq: Three times a day (TID) | ORAL | 3 refills | Status: DC

## 2021-07-26 MED ORDER — BUTALBITAL-APAP-CAFFEINE 50-325-40 MG PO TABS: 1.0000 | ORAL_TABLET | Freq: Four times a day (QID) | ORAL | 0 refills | Status: DC | PRN

## 2021-07-26 MED ORDER — BUSPIRONE HCL 10 MG PO TABS: 10.0000 mg | ORAL_TABLET | Freq: Three times a day (TID) | ORAL | 3 refills | Status: DC

## 2021-07-26 MED ORDER — PROPRANOLOL HCL 20 MG PO TABS: 20.0000 mg | ORAL_TABLET | Freq: Three times a day (TID) | ORAL | 3 refills | Status: DC | PRN

## 2021-07-26 MED ORDER — TRAZODONE HCL 100 MG PO TABS: 100.0000 mg | ORAL_TABLET | Freq: Every day | ORAL | 3 refills | Status: DC

## 2021-07-26 MED ORDER — HYDROCHLOROTHIAZIDE 25 MG PO TABS: 25.0000 mg | ORAL_TABLET | Freq: Every day | ORAL | 3 refills | Status: DC

## 2021-07-26 MED ORDER — DULOXETINE HCL 60 MG PO CPEP: 60.0000 mg | ORAL_CAPSULE | Freq: Every day | ORAL | 3 refills | Status: DC

## 2021-07-26 NOTE — Progress Notes (Signed)
Chief Complaint:   OBESITY [name] is here to discuss her progress with her obesity treatment plan along with follow-up of her obesity related diagnoses.   Today's visit was #: 15 Starting weight: 306 lbs Starting date: 05/19/2020 Today's weight: 291 lbs Today's date: 07/26/2021 Weight change since last visit: 5 lbs Total lbs lost to date: 15 lbs Body mass index is 48.42 kg/m.  Total weight loss percentage to date: -4.90%  Current Meal Plan: keeping a food journal and adhering to recommended goals of 1500 calories and 100 grams of protein for 50% of the time.  Current Exercise Plan: Cardio/strength training for 30 minutes 2-3 times per week. Current Anti-Obesity Medications: Ozempic 1 mg subcutaneously weekly. Side effects: None.  Interim History:  Anisah says she is very happy with her new job.  She says she did not tolerate the higher dose of Ozempic.  She has been working with a Systems analyst, and has also been working with a Veterinary surgeon.   Assessment/Plan:   1. Vitamin D deficiency At goal.  She is taking vitamin D 50,000 IU weekly.  Plan: Continue to take prescription Vitamin D @50 ,000 IU every week as prescribed.  Will check vitamin D level today.  Lab Results  Component Value Date   VD25OH 51.5 02/22/2021   VD25OH 24.4 (L) 09/12/2020   VD25OH 18.3 (L) 05/19/2020   - VITAMIN D 25 Hydroxy (Vit-D Deficiency, Fractures)  2. Impaired fasting glucose Stop Ozempic and start Mounjaro 2.5 mg subcutaneously weekly.  Will check A1c and insulin level today.  - Hemoglobin A1c - Insulin, random - Start tirzepatide [facility]) 2.5 MG/0.5ML Pen; Inject 2.5 mg into the skin once a week.  Dispense: 2 mL; Refill: 1  3. Mixed hyperlipidemia Course: Not at goal. Lipid-lowering medications: None.   Plan: Dietary changes: Increase soluble fiber, decrease simple carbohydrates, decrease saturated fat. Exercise changes: Moderate to vigorous-intensity aerobic activity 150 minutes  per week or as tolerated. We will continue to monitor along with PCP/specialists as it pertains to her weight loss journey.  Will check lipid panel today.  Lab Results  Component Value Date   CHOL 194 02/22/2021   HDL 43 02/22/2021   LDLCALC 125 (H) 02/22/2021   TRIG 146 02/22/2021   CHOLHDL 4.5 (H) 02/22/2021   Lab Results  Component Value Date   ALT 24 02/22/2021   AST 19 02/22/2021   ALKPHOS 114 02/22/2021   BILITOT 0.3 02/22/2021   The 10-year ASCVD risk score (Arnett DK, et al., 2019) is: 1.4%   Values used to calculate the score:     Age: 23 years     Sex: Female     Is Non-Hispanic African American: No     Diabetic: No     Tobacco smoker: No     Systolic Blood Pressure: 115 mmHg     Is BP treated: Yes     HDL Cholesterol: 43 mg/dL     Total Cholesterol: 194 mg/dL  - Comprehensive metabolic panel - Lipid panel  4. Edema, unspecified type Rose is taking HCTZ 25 mg daily for edema.   Plan:  Continue HCTZ at current dose.  Will refill today.  - Refill hydrochlorothiazide (HYDRODIURIL) 25 MG tablet; Take 1 tablet (25 mg total) by mouth daily.  Dispense: 90 tablet; Refill: 3  5. Other insomnia This is well controlled with medication.  Average hours of sleep per night: 6-7. Current treatment: trazodone 100 mg at bedtime for sleep.  Plan: Recommend sleep hygiene  measures including regular sleep schedule, optimal sleep environment, and relaxing presleep rituals.   - Refill traZODone (DESYREL) 100 MG tablet; Take 1 tablet (100 mg total) by mouth at bedtime.  Dispense: 90 tablet; Refill: 3  6. Migraine without aura and without status migrainosus, not intractable Tammy takes Fioricet for her migraines.  Will refill today, as per below.  - Refill butalbital-acetaminophen-caffeine (FIORICET) 50-325-40 MG tablet; Take 1 tablet by mouth every 6 (six) hours as needed for headache.  Dispense: 30 tablet; Refill: 0  7. Chronic pain of right knee Chloee is taking  gabapentin 300 mg three times daily.  She is followed by Sports Medicine.  Plan:  Continue gabapentin.  Will refill today.  - Refill gabapentin (NEURONTIN) 300 MG capsule; Take 1 capsule (300 mg total) by mouth 3 (three) times daily.  Dispense: 270 capsule; Refill: 3  8. History of anemia Will check CBC with diff today.  - CBC with Differential/Platelet  9. GAD (generalized anxiety disorder) She sees Maggie Font for therapy.  She is taking Buspar 10 mg three times daily, Cymbalta 60 mg daily, and propranolol 20 mg three times daily for anxiety.  Plan:  Refill Buspar, Cymbalta, and propranolol at current doses.  [name] will continue to go to therapy.  - Refill busPIRone (BUSPAR) 10 MG tablet; Take 1 tablet (10 mg total) by mouth 3 (three) times daily.  Dispense: 270 tablet; Refill: 3 - Refill DULoxetine (CYMBALTA) 60 MG capsule; Take 1 capsule (60 mg total) by mouth daily.  Dispense: 90 capsule; Refill: 3 - Refill propranolol (INDERAL) 20 MG tablet; Take 1 tablet (20 mg total) by mouth 3 (three) times daily as needed (anxiety).  Dispense: 90 tablet; Refill: 3  10. At risk for heart disease Due to Terryn's current state of health and medical condition(s), she is at a higher risk for heart disease.  This puts the patient at much greater risk to subsequently develop cardiopulmonary conditions that can significantly affect patient's quality of life in a negative manner.    At least 8 minutes were spent on counseling Delayla about these concerns today. Evidence-based interventions for health behavior change were utilized today including the discussion of self monitoring techniques, problem-solving barriers, and SMART goal setting techniques.  Specifically, regarding patient's less desirable eating habits and patterns, we employed the technique of small changes when Labria has not been able to fully commit to her prudent nutritional plan.  11. Obesity, current BMI 48.4  Course: Raylynne is  currently in the action stage of change. As such, her goal is to continue with weight loss efforts.   Nutrition goals: She has agreed to keeping a food journal and adhering to recommended goals of 1500 calories and 100 grams of protein.   Exercise goals:  As is.  Behavioral modification strategies: increasing lean protein intake, decreasing simple carbohydrates, increasing vegetables, and increasing water intake.  Vaniah has agreed to follow-up with our clinic in 4 weeks. She was informed of the importance of frequent follow-up visits to maximize her success with intensive lifestyle modifications for her multiple health conditions.   Objective:   Blood pressure 115/76, pulse 77, temperature 97.9 F (36.6 C), temperature source Oral, height 5\' 5"  (1.651 m), weight 291 lb (132 kg), last menstrual period 11/07/2012, SpO2 94 %. Body mass index is 48.42 kg/m.  General: Cooperative, alert, well developed, in no acute distress. HEENT: Conjunctivae and lids unremarkable. Cardiovascular: Regular rhythm.  Lungs: Normal work of breathing. Neurologic: No focal deficits.   Lab  Results  Component Value Date   CREATININE 0.84 02/22/2021   BUN 5 (L) 02/22/2021   NA 139 02/22/2021   K 4.5 02/22/2021   CL 98 02/22/2021   CO2 24 02/22/2021   Lab Results  Component Value Date   ALT 24 02/22/2021   AST 19 02/22/2021   ALKPHOS 114 02/22/2021   BILITOT 0.3 02/22/2021   Lab Results  Component Value Date   HGBA1C 5.5 02/22/2021   HGBA1C 5.9 (H) 09/12/2020   HGBA1C 5.7 (H) 05/13/2020   HGBA1C 5.4 02/25/2013   Lab Results  Component Value Date   INSULIN 40.6 (H) 09/12/2020   Lab Results  Component Value Date   TSH 1.750 05/13/2020   Lab Results  Component Value Date   CHOL 194 02/22/2021   HDL 43 02/22/2021   LDLCALC 125 (H) 02/22/2021   TRIG 146 02/22/2021   CHOLHDL 4.5 (H) 02/22/2021   Lab Results  Component Value Date   VD25OH 51.5 02/22/2021   VD25OH 24.4 (L) 09/12/2020    VD25OH 18.3 (L) 05/19/2020   Lab Results  Component Value Date   WBC 8.4 02/22/2021   HGB 13.5 02/22/2021   HCT 40.2 02/22/2021   MCV 90 02/22/2021   PLT 280 02/22/2021   Lab Results  Component Value Date   IRON 68 02/22/2021   TIBC 315 02/22/2021   FERRITIN 119 02/22/2021   Attestation Statements:   Reviewed by clinician on day of visit: allergies, medications, problem list, medical history, surgical history, family history, social history, and previous encounter notes.  I, Insurance claims handler, CMA, am acting as transcriptionist for Helane Rima, DO  I have reviewed the above documentation for accuracy and completeness, and I agree with the above. Helane Rima, DO

## 2021-07-27 LAB — CBC WITH DIFFERENTIAL/PLATELET
Basophils Absolute: 0.1 10*3/uL (ref 0.0–0.2)
Basos: 1 %
EOS (ABSOLUTE): 1.2 10*3/uL — ABNORMAL HIGH (ref 0.0–0.4)
Eos: 15 %
Hematocrit: 44.4 % (ref 34.0–46.6)
Hemoglobin: 14.6 g/dL (ref 11.1–15.9)
Immature Grans (Abs): 0 10*3/uL (ref 0.0–0.1)
Immature Granulocytes: 0 %
Lymphocytes Absolute: 1.9 10*3/uL (ref 0.7–3.1)
Lymphs: 25 %
MCH: 31 pg (ref 26.6–33.0)
MCHC: 32.9 g/dL (ref 31.5–35.7)
MCV: 94 fL (ref 79–97)
Monocytes Absolute: 0.5 10*3/uL (ref 0.1–0.9)
Monocytes: 7 %
Neutrophils Absolute: 4.1 10*3/uL (ref 1.4–7.0)
Neutrophils: 52 %
Platelets: 318 10*3/uL (ref 150–450)
RBC: 4.71 x10E6/uL (ref 3.77–5.28)
RDW: 13.7 % (ref 11.7–15.4)
WBC: 7.9 10*3/uL (ref 3.4–10.8)

## 2021-07-27 LAB — COMPREHENSIVE METABOLIC PANEL
ALT: 28 IU/L (ref 0–32)
AST: 24 IU/L (ref 0–40)
Albumin/Globulin Ratio: 1.6 (ref 1.2–2.2)
Albumin: 4.6 g/dL (ref 3.8–4.8)
Alkaline Phosphatase: 109 IU/L (ref 44–121)
BUN/Creatinine Ratio: 10 (ref 9–23)
BUN: 10 mg/dL (ref 6–24)
Bilirubin Total: 0.4 mg/dL (ref 0.0–1.2)
CO2: 24 mmol/L (ref 20–29)
Calcium: 9.2 mg/dL (ref 8.7–10.2)
Chloride: 97 mmol/L (ref 96–106)
Creatinine, Ser: 1 mg/dL (ref 0.57–1.00)
Globulin, Total: 2.8 g/dL (ref 1.5–4.5)
Glucose: 98 mg/dL (ref 70–99)
Potassium: 3.6 mmol/L (ref 3.5–5.2)
Sodium: 139 mmol/L (ref 134–144)
Total Protein: 7.4 g/dL (ref 6.0–8.5)
eGFR: 70 mL/min/{1.73_m2} (ref >59)

## 2021-07-27 LAB — HEMOGLOBIN A1C
Est. average glucose Bld gHb Est-mCnc: 114 mg/dL
Hgb A1c MFr Bld: 5.6 % (ref 4.8–5.6)

## 2021-07-27 LAB — LIPID PANEL
Chol/HDL Ratio: 5.3 ratio — ABNORMAL HIGH (ref 0.0–4.4)
Cholesterol, Total: 218 mg/dL — ABNORMAL HIGH (ref 100–199)
HDL: 41 mg/dL (ref >39)
LDL Chol Calc (NIH): 143 mg/dL — ABNORMAL HIGH (ref 0–99)
Triglycerides: 188 mg/dL — ABNORMAL HIGH (ref 0–149)
VLDL Cholesterol Cal: 34 mg/dL (ref 5–40)

## 2021-07-27 LAB — INSULIN, RANDOM: INSULIN: 30.2 u[IU]/mL — ABNORMAL HIGH (ref 2.6–24.9)

## 2021-07-27 LAB — VITAMIN D 25 HYDROXY (VIT D DEFICIENCY, FRACTURES): Vit D, 25-Hydroxy: 42.7 ng/mL (ref 30.0–100.0)

## 2021-08-03 ENCOUNTER — Ambulatory Visit (INDEPENDENT_AMBULATORY_CARE_PROVIDER_SITE_OTHER): Payer: BC Managed Care – PPO | Admitting: Psychology

## 2021-08-03 DIAGNOSIS — F320 Major depressive disorder, single episode, mild: Secondary | ICD-10-CM

## 2021-08-03 DIAGNOSIS — F4322 Adjustment disorder with anxiety: Secondary | ICD-10-CM

## 2021-08-28 ENCOUNTER — Ambulatory Visit (INDEPENDENT_AMBULATORY_CARE_PROVIDER_SITE_OTHER): Payer: BC Managed Care – PPO | Admitting: Family Medicine

## 2021-08-30 ENCOUNTER — Ambulatory Visit (INDEPENDENT_AMBULATORY_CARE_PROVIDER_SITE_OTHER): Payer: BC Managed Care – PPO | Admitting: Psychology

## 2021-08-30 DIAGNOSIS — F4322 Adjustment disorder with anxiety: Secondary | ICD-10-CM

## 2021-08-30 DIAGNOSIS — F320 Major depressive disorder, single episode, mild: Secondary | ICD-10-CM

## 2021-09-11 ENCOUNTER — Other Ambulatory Visit: Payer: Self-pay

## 2021-09-11 ENCOUNTER — Ambulatory Visit (INDEPENDENT_AMBULATORY_CARE_PROVIDER_SITE_OTHER): Payer: BC Managed Care – PPO | Admitting: Family Medicine

## 2021-09-11 ENCOUNTER — Encounter (INDEPENDENT_AMBULATORY_CARE_PROVIDER_SITE_OTHER): Payer: Self-pay | Admitting: Family Medicine

## 2021-09-11 VITALS — BP 112/72 | HR 69 | Temp 98.1°F | Ht 65.0 in | Wt 291.0 lb

## 2021-09-11 DIAGNOSIS — E559 Vitamin D deficiency, unspecified: Secondary | ICD-10-CM | POA: Diagnosis not present

## 2021-09-11 DIAGNOSIS — E782 Mixed hyperlipidemia: Secondary | ICD-10-CM | POA: Diagnosis not present

## 2021-09-11 DIAGNOSIS — R7301 Impaired fasting glucose: Secondary | ICD-10-CM | POA: Diagnosis not present

## 2021-09-11 DIAGNOSIS — F411 Generalized anxiety disorder: Secondary | ICD-10-CM | POA: Diagnosis not present

## 2021-09-11 DIAGNOSIS — Z6841 Body Mass Index (BMI) 40.0 and over, adult: Secondary | ICD-10-CM

## 2021-09-11 DIAGNOSIS — R7303 Prediabetes: Secondary | ICD-10-CM

## 2021-09-11 MED ORDER — DULOXETINE HCL 60 MG PO CPEP
60.0000 mg | ORAL_CAPSULE | Freq: Two times a day (BID) | ORAL | 3 refills | Status: DC
Start: 2021-09-11 — End: 2022-07-09

## 2021-09-11 MED ORDER — VITAMIN D (ERGOCALCIFEROL) 1.25 MG (50000 UNIT) PO CAPS: 50000.0000 [IU] | ORAL_CAPSULE | ORAL | 0 refills | Status: DC

## 2021-09-11 MED ORDER — TIRZEPATIDE 5 MG/0.5ML ~~LOC~~ SOAJ: 5.0000 mg | SUBCUTANEOUS | 1 refills | Status: DC

## 2021-09-12 NOTE — Progress Notes (Signed)
Chief Complaint:   OBESITY [name] is here to discuss her progress with her obesity treatment plan along with follow-up of her obesity related diagnoses. See Medical Weight Management Flowsheet for complete bioelectrical impedance results.  Today's visit was #: 16 Starting weight: 306 lbs Starting date: 05/19/2020 Weight change since last visit: 0 Total lbs lost to date: 15 Total weight loss percentage to date: -4.90%  Nutrition Plan: Keeping a food journal and adhering to recommended goals of 1500 calories and 100 grams of protein daily for 30% of the time. Activity: Going to the gym, walking for 60 minutes 2 times per week.  Anti-obesity medications: Mounjaro 2.5 mg subcutaneously weekly. Reported side effects: None.  Interim History: [name] has increased NEAT activities.  She says she started the Mediterranean diet as her son was diagnosed with fatty liver.  Her Cymbalta was increased to twice daily (suggested by therapist).  She reports that it is helping already.  Assessment/Plan:   1. Mixed hyperlipidemia Course: Not at goal. Lipid-lowering medications: None.   Plan: Dietary changes: Increase soluble fiber, decrease simple carbohydrates, decrease saturated fat. Exercise changes: Moderate to vigorous-intensity aerobic activity 150 minutes per week or as tolerated. We will continue to monitor along with PCP/specialists as it pertains to her weight loss journey.  Lab Results  Component Value Date   CHOL 218 (H) 07/26/2021   HDL 41 07/26/2021   LDLCALC 143 (H) 07/26/2021   TRIG 188 (H) 07/26/2021   CHOLHDL 5.3 (H) 07/26/2021   Lab Results  Component Value Date   ALT 28 07/26/2021   AST 24 07/26/2021   ALKPHOS 109 07/26/2021   BILITOT 0.4 07/26/2021   The 10-year ASCVD risk score (Arnett DK, et al., 2019) is: 1.7%   Values used to calculate the score:     Age: 45 years     Sex: Female     Is Non-Hispanic African American: No     Diabetic: No     Tobacco smoker:  No     Systolic Blood Pressure: 112 mmHg     Is BP treated: Yes     HDL Cholesterol: 41 mg/dL     Total Cholesterol: 218 mg/dL  2. Vitamin D deficiency Improving, but not optimized. She is taking vitamin D 50,000 IU weekly.  Plan: Continue to take prescription Vitamin D @50 ,000 IU every week as prescribed.  Follow-up for routine testing of Vitamin D, at least 2-3 times per year to avoid over-replacement.  Lab Results  Component Value Date   VD25OH 42.7 07/26/2021   VD25OH 51.5 02/22/2021   VD25OH 24.4 (L) 09/12/2020   - Refill Vitamin D, Ergocalciferol, (DRISDOL) 1.25 MG (50000 UNIT) CAPS capsule; Take 1 capsule (50,000 Units total) by mouth 2 (two) times a week.  Dispense: 24 capsule; Refill: 0  3. Impaired fasting glucose Increase Mounjaro to 5 mg subcutaneously weekly, as per below.  - Increase tirzepatide (MOUNJARO) 5 MG/0.5ML Pen; Inject 5 mg into the skin once a week.  Dispense: 6 mL; Refill: 1  4. GAD (generalized anxiety disorder) [name] is taking Cymbalta 60 mg twice daily.  This was recently increased, and she reports that the increased dose is working well for her.  - Refill DULoxetine (CYMBALTA) 60 MG capsule; Take 1 capsule (60 mg total) by mouth 2 (two) times daily.  Dispense: 90 capsule; Refill: 3  5. Obesity, current BMI 48.5  Course: [name] is currently in the action stage of change. As such, her goal is to continue  with weight loss efforts.   Nutrition goals: She has agreed to keeping a food journal and adhering to recommended goals of 1500 calories and 100 grams of protein.   Exercise goals:  As is.  Behavioral modification strategies: increasing lean protein intake, decreasing simple carbohydrates, and increasing vegetables.  [name] has agreed to follow-up with our clinic in 4 weeks. She was informed of the importance of frequent follow-up visits to maximize her success with intensive lifestyle modifications for her multiple health conditions.    Objective:   Blood pressure 112/72, pulse 69, temperature 98.1 F (36.7 C), height 5\' 5"  (1.651 m), weight 291 lb (132 kg), last menstrual period 11/07/2012, SpO2 98 %. Body mass index is 48.42 kg/m.  General: Cooperative, alert, well developed, in no acute distress. HEENT: Conjunctivae and lids unremarkable. Cardiovascular: Regular rhythm.  Lungs: Normal work of breathing. Neurologic: No focal deficits.   Lab Results  Component Value Date   CREATININE 1.00 07/26/2021   BUN 10 07/26/2021   NA 139 07/26/2021   K 3.6 07/26/2021   CL 97 07/26/2021   CO2 24 07/26/2021   Lab Results  Component Value Date   ALT 28 07/26/2021   AST 24 07/26/2021   ALKPHOS 109 07/26/2021   BILITOT 0.4 07/26/2021   Lab Results  Component Value Date   HGBA1C 5.6 07/26/2021   HGBA1C 5.5 02/22/2021   HGBA1C 5.9 (H) 09/12/2020   HGBA1C 5.7 (H) 05/13/2020   HGBA1C 5.4 02/25/2013   Lab Results  Component Value Date   INSULIN 30.2 (H) 07/26/2021   INSULIN 40.6 (H) 09/12/2020   Lab Results  Component Value Date   TSH 1.750 05/13/2020   Lab Results  Component Value Date   CHOL 218 (H) 07/26/2021   HDL 41 07/26/2021   LDLCALC 143 (H) 07/26/2021   TRIG 188 (H) 07/26/2021   CHOLHDL 5.3 (H) 07/26/2021   Lab Results  Component Value Date   VD25OH 42.7 07/26/2021   VD25OH 51.5 02/22/2021   VD25OH 24.4 (L) 09/12/2020   Lab Results  Component Value Date   WBC 7.9 07/26/2021   HGB 14.6 07/26/2021   HCT 44.4 07/26/2021   MCV 94 07/26/2021   PLT 318 07/26/2021   Lab Results  Component Value Date   IRON 68 02/22/2021   TIBC 315 02/22/2021   FERRITIN 119 02/22/2021   Attestation Statements:   Reviewed by clinician on day of visit: allergies, medications, problem list, medical history, surgical history, family history, social history, and previous encounter notes.  I, 02/24/2021, CMA, am acting as transcriptionist for Insurance claims handler, DO  I have reviewed the above documentation for  accuracy and completeness, and I agree with the above. -  Helane Rima, DO, MS, FAAFP, DABOM - Family and Bariatric Medicine.

## 2021-09-20 ENCOUNTER — Ambulatory Visit (INDEPENDENT_AMBULATORY_CARE_PROVIDER_SITE_OTHER): Payer: BC Managed Care – PPO | Admitting: Psychology

## 2021-09-20 DIAGNOSIS — F320 Major depressive disorder, single episode, mild: Secondary | ICD-10-CM | POA: Diagnosis not present

## 2021-09-20 DIAGNOSIS — F4322 Adjustment disorder with anxiety: Secondary | ICD-10-CM

## 2021-10-03 ENCOUNTER — Encounter (HOSPITAL_COMMUNITY): Payer: Self-pay | Admitting: Hematology

## 2021-10-16 ENCOUNTER — Encounter (INDEPENDENT_AMBULATORY_CARE_PROVIDER_SITE_OTHER): Payer: Self-pay | Admitting: Family Medicine

## 2021-10-16 ENCOUNTER — Ambulatory Visit (INDEPENDENT_AMBULATORY_CARE_PROVIDER_SITE_OTHER): Payer: BC Managed Care – PPO | Admitting: Family Medicine

## 2021-10-16 ENCOUNTER — Other Ambulatory Visit: Payer: Self-pay

## 2021-10-16 VITALS — BP 119/79 | HR 71 | Temp 98.3°F | Ht 65.0 in | Wt 304.0 lb

## 2021-10-16 DIAGNOSIS — G4733 Obstructive sleep apnea (adult) (pediatric): Secondary | ICD-10-CM | POA: Diagnosis not present

## 2021-10-16 DIAGNOSIS — G43009 Migraine without aura, not intractable, without status migrainosus: Secondary | ICD-10-CM

## 2021-10-16 DIAGNOSIS — R600 Localized edema: Secondary | ICD-10-CM

## 2021-10-16 DIAGNOSIS — M5410 Radiculopathy, site unspecified: Secondary | ICD-10-CM | POA: Diagnosis not present

## 2021-10-16 DIAGNOSIS — R7301 Impaired fasting glucose: Secondary | ICD-10-CM | POA: Diagnosis not present

## 2021-10-16 DIAGNOSIS — Z9989 Dependence on other enabling machines and devices: Secondary | ICD-10-CM

## 2021-10-16 DIAGNOSIS — Z6841 Body Mass Index (BMI) 40.0 and over, adult: Secondary | ICD-10-CM

## 2021-10-17 ENCOUNTER — Encounter (INDEPENDENT_AMBULATORY_CARE_PROVIDER_SITE_OTHER): Payer: Self-pay

## 2021-10-17 ENCOUNTER — Ambulatory Visit: Payer: BC Managed Care – PPO | Admitting: Psychology

## 2021-10-17 NOTE — Progress Notes (Signed)
Chief Complaint:   OBESITY [name] is here to discuss her progress with her obesity treatment plan along with follow-up of her obesity related diagnoses. See Medical Weight Management Flowsheet for complete bioelectrical impedance results.  Today's visit was #: 17 Starting weight: 306 lbs Starting date: 05/19/2020 Weight change since last visit: +13 lbs Total lbs lost to date: 2 lbs Total weight loss percentage to date: -0.65%  Nutrition Plan: Keeping a food journal and adhering to recommended goals of 1500 calories and 100 grams of protein daily for 25% of the time. Activity: None. Anti-obesity medications: Mounjaro 5 mg subcutaneously weekly. Reported side effects: None.  Interim History: [name] says she is taking meloxicam for her back strain.  She reports still being in a lot of pain.  She does not tolerate steroids because they cause migraines.  She stopped Mounjaro during this time.  Assessment/Plan:   1. Impaired fasting glucose [name] is taking Mounjaro 5 mg subcutaneously weekly.  Plan:  Continue Mounjaro 5 mg subcutaneously weekly.  Will refill today.  2. Bilateral lower extremity edema Worsening. Start Lasix 20 mg daily as needed for edema (take daily for 3 days to start).  She will also take potassium 20 mEq daily (take with Lasix).  3. Back pain with radiculopathy Increase gabapentin to 200 mg at bedtime.  She needs a handicap placard and we can complete that today. I encouraged her to follow up with her Neurosurgeon.  4. OSA on CPAP [name] needs a new mask for her CPAP machine. It looks like I cannot help with a prescription. I encouraged her to call her sleep specialist for follow up.  OSA is a cause of systemic hypertension and is associated with an increased incidence of stroke, heart failure, atrial fibrillation, and coronary heart disease. Severe OSA increases all-cause mortality and cardiovascular mortality.   Goal: Treatment of OSA via CPAP  compliance and weight loss. Plasma ghrelin levels (appetite or "hunger hormone") are significantly higher in OSA patients than in BMI-matched controls, but decrease to levels similar to those of obese patients without OSA after CPAP treatment.  Weight loss improves OSA by several mechanisms, including reduction in fatty tissue in the throat (i.e. parapharyngeal fat) and the tongue. Loss of abdominal fat increases mediastinal traction on the upper airway making it less likely to collapse during sleep. Studies have also shown that compliance with CPAP treatment improves leptin (hunger inhibitory hormone) imbalance.  5. Migraine without aura and without status migrainosus, not intractable [name] takes Fioricet 50-325-40 mg every 6 hours as needed for headache.  Will refill  today.  6. Obesity, current BMI 50.6  Course: [name] is currently in the action stage of change. As such, her goal is to continue with weight loss efforts.   Nutrition goals: She has agreed to keeping a food journal and adhering to recommended goals of 1500 calories and 100 grams of protein.   Exercise goals: No exercise has been prescribed at this time.  Behavioral modification strategies: increasing lean protein intake, decreasing simple carbohydrates, increasing vegetables, and increasing water intake.  [name] has agreed to follow-up with our clinic in 4 weeks. She was informed of the importance of frequent follow-up visits to maximize her success with intensive lifestyle modifications for her multiple health conditions.   Objective:   Blood pressure 119/79, pulse 71, temperature 98.3 F (36.8 C), temperature source Oral, height 5\' 5"  (1.651 m), weight (!) 304 lb (137.9 kg), last menstrual period 11/07/2012, SpO2 97 %. Body mass  index is 50.59 kg/m.  General: Cooperative, alert, well developed, in no acute distress. HEENT: Conjunctivae and lids unremarkable. Cardiovascular: Regular rhythm.  Lungs: Normal work of  breathing. Neurologic: No focal deficits.   Lab Results  Component Value Date   CREATININE 1.00 07/26/2021   BUN 10 07/26/2021   NA 139 07/26/2021   K 3.6 07/26/2021   CL 97 07/26/2021   CO2 24 07/26/2021   Lab Results  Component Value Date   ALT 28 07/26/2021   AST 24 07/26/2021   ALKPHOS 109 07/26/2021   BILITOT 0.4 07/26/2021   Lab Results  Component Value Date   HGBA1C 5.6 07/26/2021   HGBA1C 5.5 02/22/2021   HGBA1C 5.9 (H) 09/12/2020   HGBA1C 5.7 (H) 05/13/2020   HGBA1C 5.4 02/25/2013   Lab Results  Component Value Date   INSULIN 30.2 (H) 07/26/2021   INSULIN 40.6 (H) 09/12/2020   Lab Results  Component Value Date   TSH 1.750 05/13/2020   Lab Results  Component Value Date   CHOL 218 (H) 07/26/2021   HDL 41 07/26/2021   LDLCALC 143 (H) 07/26/2021   TRIG 188 (H) 07/26/2021   CHOLHDL 5.3 (H) 07/26/2021   Lab Results  Component Value Date   VD25OH 42.7 07/26/2021   VD25OH 51.5 02/22/2021   VD25OH 24.4 (L) 09/12/2020   Lab Results  Component Value Date   WBC 7.9 07/26/2021   HGB 14.6 07/26/2021   HCT 44.4 07/26/2021   MCV 94 07/26/2021   PLT 318 07/26/2021   Lab Results  Component Value Date   IRON 68 02/22/2021   TIBC 315 02/22/2021   FERRITIN 119 02/22/2021   Attestation Statements:   Reviewed by clinician on day of visit: allergies, medications, problem list, medical history, surgical history, family history, social history, and previous encounter notes.  Time spent on visit including pre-visit chart review and post-visit care and documentation was 43 minutes. Time was spent on: Food choices and timing of food intake reviewed today. I discussed a personalized meal plan with the patient that will help her to lose weight and will improve her obesity-related conditions going forward. I performed a medically necessary appropriate examination and/or evaluation. I discussed the assessment and treatment plan with the patient. Motivational interviewing  as well as evidence-based interventions for health behavior change were utilized today including the discussion of self monitoring techniques, problem-solving barriers and SMART goal setting techniques.  An exercise prescription was reviewed.  The patient was provided an opportunity to ask questions and all were answered. The patient agreed with the plan and demonstrated an understanding of the instructions. Clinical information was updated and documented in the EMR.  I, Insurance claims handler, CMA, am acting as transcriptionist for Helane Rima, DO  I have reviewed the above documentation for accuracy and completeness, and I agree with the above. -  Helane Rima, DO, MS, FAAFP, DABOM - Family and Bariatric Medicine.

## 2021-10-18 ENCOUNTER — Encounter (INDEPENDENT_AMBULATORY_CARE_PROVIDER_SITE_OTHER): Payer: Self-pay | Admitting: Family Medicine

## 2021-10-18 MED ORDER — BUTALBITAL-APAP-CAFFEINE 50-325-40 MG PO TABS: 1.0000 | ORAL_TABLET | Freq: Four times a day (QID) | ORAL | 0 refills | Status: DC | PRN

## 2021-10-18 MED ORDER — FUROSEMIDE 20 MG PO TABS: 20.0000 mg | ORAL_TABLET | Freq: Every day | ORAL | 0 refills | Status: DC | PRN

## 2021-10-18 MED ORDER — POTASSIUM CHLORIDE CRYS ER 20 MEQ PO TBCR: 20.0000 meq | EXTENDED_RELEASE_TABLET | Freq: Every day | ORAL | 0 refills | Status: DC

## 2021-10-18 MED ORDER — TIRZEPATIDE 5 MG/0.5ML ~~LOC~~ SOAJ: 5.0000 mg | SUBCUTANEOUS | 1 refills | Status: DC

## 2021-10-28 ENCOUNTER — Encounter (HOSPITAL_COMMUNITY): Payer: Self-pay | Admitting: Hematology

## 2021-11-01 ENCOUNTER — Ambulatory Visit (INDEPENDENT_AMBULATORY_CARE_PROVIDER_SITE_OTHER): Payer: BC Managed Care – PPO | Admitting: Internal Medicine

## 2021-11-01 VITALS — BP 134/73 | HR 96 | Resp 18 | Ht 65.0 in | Wt 304.0 lb

## 2021-11-01 DIAGNOSIS — Z9989 Dependence on other enabling machines and devices: Secondary | ICD-10-CM

## 2021-11-01 DIAGNOSIS — Z6841 Body Mass Index (BMI) 40.0 and over, adult: Secondary | ICD-10-CM | POA: Diagnosis not present

## 2021-11-01 DIAGNOSIS — G43009 Migraine without aura, not intractable, without status migrainosus: Secondary | ICD-10-CM

## 2021-11-01 DIAGNOSIS — I10 Essential (primary) hypertension: Secondary | ICD-10-CM

## 2021-11-01 DIAGNOSIS — G4733 Obstructive sleep apnea (adult) (pediatric): Secondary | ICD-10-CM

## 2021-11-01 DIAGNOSIS — Z7189 Other specified counseling: Secondary | ICD-10-CM

## 2021-11-01 DIAGNOSIS — F419 Anxiety disorder, unspecified: Secondary | ICD-10-CM

## 2021-11-01 DIAGNOSIS — J4520 Mild intermittent asthma, uncomplicated: Secondary | ICD-10-CM | POA: Diagnosis not present

## 2021-11-01 DIAGNOSIS — F32A Depression, unspecified: Secondary | ICD-10-CM

## 2021-11-01 NOTE — Patient Instructions (Signed)

## 2021-11-01 NOTE — Progress Notes (Signed)
[facility] [name] [place], [place] [address]  Pulmonary Sleep Medicine   Office Visit Note  Patient Name: [name] DOB: [birth year] MRN [id number]    Chief Complaint: Obstructive Sleep Apnea visit  Brief History:  [name] is seen today for initial consult for APAP@ 6-20 cmH2O. The patient has a 2 year history of sleep apnea. Patient is using PAP nightly.  The patient feels somewhat rested after sleeping with PAP. Patient is in need of new supplies. The patient reports benefiting from PAP use. Reported sleepiness is improved with the CPAP and the Epworth Sleepiness Score is 10 out of 24. The patient rarely take naps. The patient complains of the following: none.  The compliance download shows 99% compliance with an average use time of 7 hours 54 minutes. The AHI is 1.6.  The patient does not complain of limb movements disrupting sleep.  ROS  General: (-) fever, (-) chills, (-) night sweat Nose and Sinuses: (-) nasal stuffiness or itchiness, (-) postnasal drip, (-) nosebleeds, (-) sinus trouble. Mouth and Throat: (-) sore throat, (-) hoarseness. Neck: (-) swollen glands, (-) enlarged thyroid, (-) neck pain. Respiratory: - cough, + shortness of breath, + wheezing. Neurologic: + numbness, + tingling. Psychiatric: + anxiety, - depression   Current Medication: Outpatient Encounter Medications as of 11/01/2021  Medication Sig   albuterol (VENTOLIN HFA) 108 (90 Base) MCG/ACT inhaler Inhale 2 puffs into the lungs every 6 (six) hours as needed for wheezing.   busPIRone (BUSPAR) 10 MG tablet Take 1 tablet (10 mg total) by mouth 3 (three) times daily.   butalbital-acetaminophen-caffeine (FIORICET) 50-325-40 MG tablet Take 1 tablet by mouth every 6 (six) hours as needed for headache.   Cyanocobalamin (B-12 PO) Take by mouth.   DULoxetine (CYMBALTA) 60 MG capsule Take 1 capsule (60 mg total) by mouth 2 (two) times daily.   EPINEPHrine (EPIPEN) 0.3 mg/0.3 mL DEVI  Inject 0.3 mLs (0.3 mg total) into the muscle once.   furosemide (LASIX) 20 MG tablet Take 1 tablet (20 mg total) by mouth daily as needed.   gabapentin (NEURONTIN) 300 MG capsule Take 1 capsule (300 mg total) by mouth 3 (three) times daily.   hydrochlorothiazide (HYDRODIURIL) 25 MG tablet Take 1 tablet (25 mg total) by mouth daily.   loratadine (CLARITIN) 10 MG tablet Take 10 mg by mouth daily.   potassium chloride SA (KLOR-CON M) 20 MEQ tablet Take 1 tablet (20 mEq total) by mouth daily. Take with Lasix   propranolol (INDERAL) 20 MG tablet Take 1 tablet (20 mg total) by mouth 3 (three) times daily as needed (anxiety).   tirzepatide [facility](MOUNJARO) 5 MG/0.5ML Pen Inject 5 mg into the skin once a week.   traZODone (DESYREL) 100 MG tablet Take 1 tablet (100 mg total) by mouth at bedtime.   Vitamin D, Ergocalciferol, (DRISDOL) 1.25 MG (50000 UNIT) CAPS capsule Take 1 capsule (50,000 Units total) by mouth 2 (two) times a week.   No facility-administered encounter medications on file as of 11/01/2021.    Surgical History: Past Surgical History:  Procedure Laterality Date   ABDOMINAL HYSTERECTOMY     ACHILLES TENDON LENGTHENING  1989   ANKLE GANGLION CYST EXCISION  02/2012   left ankle   ANTERIOR LAT LUMBAR FUSION Right 05/11/2019   Procedure: ANTERIOR LATERAL LUMBAR INTERBODY FUSION, LATERAL INSTRUMENTATION, RIGHT LUMBAR TWO- LUMBAR THREE;  Surgeon: Tressie StalkerJenkins, Jeffrey, MD;  [facility];  Service: Neurosurgery;  Laterality: Right;  ANTERIOR LATERAL LUMBAR INTERBODY FUSION, LATERAL  INSTRUMENTATION, RIGHT LUMBAR TWO- LUMBAR THREE   BIOPSY  03/16/2019   Procedure: BIOPSY;  Surgeon: Corbin Ade, MD;  Location: [facility];  Service: Endoscopy;;  Gastric    COLONOSCOPY WITH PROPOFOL N/A 03/16/2019   Dr. Jena Gauss: Normal terminal ileum, 15 cm.  Normal colon.  Next colonoscopy 10 years   DILATION AND CURETTAGE OF UTERUS  2003   ENTEROSCOPY N/A 04/02/2019   Dr. Jena Gauss: bulbar erosions, jejunal ulcerations,  attempted biopsy but scope fell backwards and lesions cannot be reidentified.  No evidence of ampullary lesion or mass.   ESOPHAGOGASTRODUODENOSCOPY (EGD) WITH PROPOFOL N/A 03/16/2019   Dr. Jena Gauss: Large hiatal hernia, Cameron/antral erosions, duodenal erosions.  Gastric biopsy showed reactive gastropathy, mild chronic gastritis, negative for H. pylori   FEET SURGERY  1990   INSERTION OF BONE GRAFT IN FEET   GIVENS CAPSULE STUDY N/A 03/16/2019   Procedure: GIVENS CAPSULE STUDY;  Surgeon: Corbin Ade, MD;  Location: [facility];  Service: Endoscopy;  Laterality: N/A;   LAPAROSCOPIC GASTRIC BANDING  10/17/10   Dr Gaynelle Adu; [facility]   LAPAROSCOPIC REPAIR AND REMOVAL OF GASTRIC BAND  01/2018   LIPOMA EXCISION  02/2006   abdomen   SPINE SURGERY N/A    Phreesia 11/10/2020    Medical History: Past Medical History:  Diagnosis Date   ADD (attention deficit disorder)    Allergies    Anemia    Arthritis    Asthma    EXERCISE INDUCED   B12 deficiency    Bilateral ovarian cysts 11/13/2012   Bursitis of hip    Chronic back pain    Depression    Family history of adverse reaction to anesthesia    patient states father had a reaction to anesthesia about 20 years ago where his face and throat swelled, had to be reintubated."   Family history of celiac disease 07/28/2018   Family history of rheumatoid arthritis 08/27/2018   Maternal uncle   Fibromyalgia    Flat foot 08/27/2018   Status post bilateral foot reconstruction surgery and Achilles tendon lengthening in childhood.   Generalized headaches    GERD (gastroesophageal reflux disease)    H/O laparoscopic adjustable gastric banding 07/29/2013   10/17/10    History of removal of laparoscopic gastric banding device 02/03/2018   Irregular menses    Joint pain    Lactose intolerance    Lower extremity edema    Migraines    Muscle pain    Obesity (BMI 30-39.9)    Osteoarthritis    Other allergic rhinitis 05/24/2020   Ovarian  retention cyst 11/13/2012   Sleep apnea    SOB (shortness of breath)    Stomach ulcer    Swallowing difficulty    Vision problems    Vitamin D deficiency     Family History: Non contributory to the present illness  Social History: Social History   Socioeconomic History   Marital status: Married    Spouse name: [name]   Number of children: 3   Years of education: Not on file   Highest education level: Not on file  Occupational History   Occupation: school counselor  Tobacco Use   Smoking status: Never   Smokeless tobacco: Never  Vaping Use   Vaping Use: Never used  Substance and Sexual Activity   Alcohol use: No   Drug use: No   Sexual activity: Yes  Other Topics Concern   Not on file  Social History Narrative   Lives  with husband married 18 years Dec 2021      Oldest is 8623- son lives in [place]   12 daughter and 5017 son year olds in home      Cat: Snickers       Enjoy: cooking some, puzzles, reading       Diet: eats all food groups outside allergies    Caffeine: 2 cans of diet soda daily and some tea   Water: 3 24 oz containers daily       Wears seat belt    Does not use phone while driving    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 rate 18, height 5\' 5"  (1.651 m), weight (!) 304 lb (137.9 kg), last menstrual period 11/07/2012, SpO2 95 %. Body mass index is 50.59 kg/m.    Examination: General Appearance: The patient is well-developed, well-nourished, and in no distress. Neck Circumference: 44 cm Skin: Gross inspection of skin unremarkable. Head: normocephalic, no gross deformities. Eyes: no gross  deformities noted. ENT: ears appear grossly normal Neurologic: Alert and oriented. No involuntary movements.    EPWORTH SLEEPINESS SCALE:  Scale:  (0)= no chance of dozing; (1)= slight chance of dozing; (2)= moderate chance of dozing; (3)= high chance of dozing  Chance  Situtation    Sitting and reading: 2    Watching TV: 1    Sitting Inactive in public: 1    As a passenger in car: 1      Lying down to rest: 2    Sitting and talking: 0    Sitting quielty after lunch: 2    In a car, stopped in traffic: 1   TOTAL SCORE:   10 out of 24    SLEEP STUDIES:  PSG (06/2019) AHI 17.4/hr, RDI 29.3/hr, min SpO2 80%   CPAP COMPLIANCE DATA:  Date Range: 10/25/2020-10/24/2021  Average Daily Use: 7 hours 54 minutes  Median Use: 7 hours 45 minutes  Compliance for > 4 Hours: 99%  AHI: 1.6 respiratory events per hour  Days Used: 363/365 days  Mask Leak: 10.3  95th Percentile Pressure: 13.8         LABS: No results found for this or any previous visit (from the past 2160 hour(s)).  Radiology: DG Lumbar Spine 2-3 Views  Result Date: 05/11/2019 CLINICAL DATA:  Portable imaging for L2-L3 interbody fusion. EXAM: DG C-ARM 61-120 MIN; LUMBAR SPINE - 2-3 VIEW COMPARISON:  None. FINDINGS: Two submitted portable images show placement of interbody fusion hardware, supported by a right lateral fixation plate and 2 screws extending across the lower vertebral body of L2 in the upper vertebral body of L3. The orthopedic hardware appears well seated and well-positioned. IMPRESSION: Portable imaging provided for interbody fusion at L2-L3. Electronically Signed   By: Amie Portlandavid  Ormond M.D.   On: 05/11/2019 10:32   DG C-Arm 1-60 Min  Result Date: 05/11/2019 CLINICAL DATA:  Portable imaging for L2-L3 interbody fusion. EXAM: DG C-ARM 61-120 MIN; LUMBAR SPINE - 2-3 VIEW COMPARISON:  None. FINDINGS: Two submitted portable images show placement of interbody fusion hardware, supported by a  right lateral fixation plate and 2 screws extending across the lower vertebral body of L2 in the upper vertebral body of L3. The orthopedic hardware appears well seated and well-positioned. IMPRESSION: Portable imaging provided for interbody fusion at L2-L3. Electronically Signed   By: Amie Portland M.D.   On: 05/11/2019 10:32    No results found.  No results found.    Assessment and Plan: Patient Active Problem List   Diagnosis Date Noted   Chronic pain of right knee 01/11/2021   Primary osteoarthritis of right knee 01/11/2021   Vitamin D deficiency 10/06/2020   Prediabetes 10/06/2020   Anxiety, with emotional eating 10/06/2020   Mixed hyperlipidemia 10/06/2020   Situational anxiety 09/08/2020   Migraine without aura and without status migrainosus, not intractable 09/08/2020   Mild intermittent asthma without complication 05/24/2020   OSA on CPAP 07/09/2019   Degenerative scoliosis in adult patient 05/11/2019   Anxiety and depression 04/08/2019   Fibromyalgia 04/08/2019   Primary osteoarthritis of both knees 09/17/2018   Primary osteoarthritis of both hands 09/17/2018   DDD (degenerative disc disease), lumbar 08/27/2018   Multiple food allergies 07/28/2018   Iron deficiency anemia 02/07/2018   Prolapsed lumbar disc 12/27/2017   Major depression, recurrent, chronic (HCC) 01/22/2011   Morbid obesity with BMI of 50.0-59.9, adult (HCC) 02/13/2010      The patient does tolerate PAP and reports benefit from PAP use. The patient was reminded how to adjust mask fit and advised to change supplies regularly. The patient was also counselled on nightly use. The compliance is excellent. The AHI is 1.6.   1. OSA on CPAP Continue excellent compliance  2. CPAP use counseling CPAP couseling-Discussed importance of adequate CPAP use as well as proper care and cleaning techniques of machine and all supplies.  3. Mild intermittent asthma without complication Continue inhaler as prescribed  and as indicated  4. Migraine without aura and without status migrainosus, not intractable Continue current medication and f/u with PCP.  5. Essential hypertension Continue current medication and f/u with PCP.  6. Anxiety and depression Continue current medication and f/u with PCP.  7. Morbid obesity with BMI of 50.0-59.9, adult (HCC) Obesity Counseling: Had a lengthy discussion regarding patients BMI and weight issues. Patient was instructed on portion control as well as increased activity. Also discussed caloric restrictions with trying to maintain intake less than 2000 Kcal. Discussions were made in accordance with the 5As of weight management. Simple actions such as not eating late and if able to, taking a walk is suggested.    General Counseling: I have discussed the findings of the evaluation and examination with [facility].  I have also discussed any further diagnostic evaluation thatmay be needed or ordered today. Farrah verbalizes understanding of the findings of todays visit. We also reviewed her medications today and discussed drug interactions and side effects including but not limited excessive drowsiness and altered mental states. We also discussed that there is always a risk not just to her but also people around her. she has been encouraged to call the office with any questions or concerns that should arise related to todays visit.  No orders of the defined types were placed in this encounter.       I have personally obtained a history, examined the patient, evaluated laboratory and imaging results, formulated the assessment and plan and placed orders.  This patient was seen by Lynn Ito, PA-C in collaboration with [name] as a part of collaborative care agreement.  Allyne Gee, MD [facility] Diplomate ABMS Pulmonary Critical Care Medicine and Sleep Medicine

## 2021-11-02 ENCOUNTER — Other Ambulatory Visit (HOSPITAL_COMMUNITY): Payer: Self-pay | Admitting: Student

## 2021-11-02 ENCOUNTER — Other Ambulatory Visit: Payer: Self-pay | Admitting: Student

## 2021-11-02 DIAGNOSIS — M5136 Other intervertebral disc degeneration, lumbar region: Secondary | ICD-10-CM

## 2021-11-10 ENCOUNTER — Other Ambulatory Visit: Payer: Self-pay

## 2021-11-10 ENCOUNTER — Ambulatory Visit (HOSPITAL_COMMUNITY)
Admission: RE | Admit: 2021-11-10 | Discharge: 2021-11-10 | Disposition: A | Discharge status: Home / Self Care | Payer: BC Managed Care – PPO | Source: Ambulatory Visit | Attending: Student | Admitting: Student

## 2021-11-10 DIAGNOSIS — M5136 Other intervertebral disc degeneration, lumbar region: Secondary | ICD-10-CM | POA: Insufficient documentation

## 2021-11-22 ENCOUNTER — Ambulatory Visit: Payer: BC Managed Care – PPO | Admitting: Internal Medicine

## 2021-11-22 ENCOUNTER — Other Ambulatory Visit: Payer: Self-pay

## 2021-11-22 ENCOUNTER — Encounter: Payer: Self-pay | Admitting: Internal Medicine

## 2021-11-22 VITALS — BP 120/84 | HR 91 | Temp 98.5°F | Ht 64.49 in | Wt 299.0 lb

## 2021-11-22 DIAGNOSIS — R609 Edema, unspecified: Secondary | ICD-10-CM

## 2021-11-22 DIAGNOSIS — Z9989 Dependence on other enabling machines and devices: Secondary | ICD-10-CM

## 2021-11-22 DIAGNOSIS — E782 Mixed hyperlipidemia: Secondary | ICD-10-CM

## 2021-11-22 DIAGNOSIS — R7303 Prediabetes: Secondary | ICD-10-CM

## 2021-11-22 DIAGNOSIS — G4733 Obstructive sleep apnea (adult) (pediatric): Secondary | ICD-10-CM

## 2021-11-22 DIAGNOSIS — M5136 Other intervertebral disc degeneration, lumbar region: Secondary | ICD-10-CM

## 2021-11-22 DIAGNOSIS — R600 Localized edema: Secondary | ICD-10-CM | POA: Insufficient documentation

## 2021-11-22 MED ORDER — CEPHALEXIN 500 MG PO CAPS: 500.0000 mg | ORAL_CAPSULE | Freq: Two times a day (BID) | ORAL | 0 refills | Status: AC

## 2021-11-22 NOTE — Progress Notes (Signed)
BP 120/84    Pulse 91    Temp 98.5 F (36.9 C) (Oral)    Ht 5' 4.49" (1.638 m)    Wt 299 lb (135.6 kg)    LMP 11/07/2012    SpO2 96%    BMI 50.55 kg/m    Subjective:    Patient ID: [name], female    DOB: December 04, 1975, 46 y.o.   MRN: [id number]  Chief Complaint  Patient presents with   New Patient (Initial Visit)    To est. Care. Having back surgery on 12/13/21    HPI: [name] is a 46 y.o. female  Pt is here to estbalish care Is on mounjaro started x 1 month ago - is down to 10 lbs.   Has had lower ext edema - has een a pcp @ healthy weight and wellness  Hyperlipidemia  Back Pain  Hypertension   Chief Complaint  Patient presents with   New Patient (Initial Visit)    To est. Care. Having back surgery on 12/13/21    Relevant past medical, surgical, family and social history reviewed and updated as indicated. Interim medical history since our last visit reviewed. Allergies and medications reviewed and updated.  Review of Systems  Musculoskeletal:  Positive for back pain.   Per HPI unless specifically indicated above     Objective:    BP 120/84    Pulse 91    Temp 98.5 F (36.9 C) (Oral)    Ht 5' 4.49" (1.638 m)    Wt 299 lb (135.6 kg)    LMP 11/07/2012    SpO2 96%    BMI 50.55 kg/m   Wt Readings from Last 3 Encounters:  11/22/21 299 lb (135.6 kg)  11/01/21 (!) 304 lb (137.9 kg)  10/16/21 (!) 304 lb (137.9 kg)    Physical Exam  Results for orders placed or performed in visit on 07/26/21  CBC with Differential/Platelet  Result Value Ref Range   WBC 7.9 3.4 - 10.8 x10E3/uL   RBC 4.71 3.77 - 5.28 x10E6/uL   Hemoglobin 14.6 11.1 - 15.9 g/dL   Hematocrit 44.4 34.0 - 46.6 %   MCV 94 79 - 97 fL   MCH 31.0 26.6 - 33.0 pg   MCHC 32.9 31.5 - 35.7 g/dL   RDW 13.7 11.7 - 15.4 %   Platelets 318 150 - 450 x10E3/uL   Neutrophils 52 Not Estab. %   Lymphs 25 Not Estab. %   Monocytes 7 Not Estab. %   Eos 15 Not Estab. %   Basos 1 Not Estab. %    Neutrophils Absolute 4.1 1.4 - 7.0 x10E3/uL   Lymphocytes Absolute 1.9 0.7 - 3.1 x10E3/uL   Monocytes Absolute 0.5 0.1 - 0.9 x10E3/uL   EOS (ABSOLUTE) 1.2 (H) 0.0 - 0.4 x10E3/uL   Basophils Absolute 0.1 0.0 - 0.2 x10E3/uL   Immature Granulocytes 0 Not Estab. %   Immature Grans (Abs) 0.0 0.0 - 0.1 x10E3/uL  Comprehensive metabolic panel  Result Value Ref Range   Glucose 98 70 - 99 mg/dL   BUN 10 6 - 24 mg/dL   Creatinine, Ser 1.00 0.57 - 1.00 mg/dL   eGFR 70 >59 mL/min/1.73   BUN/Creatinine Ratio 10 9 - 23   Sodium 139 134 - 144 mmol/L   Potassium 3.6 3.5 - 5.2 mmol/L   Chloride 97 96 - 106 mmol/L   CO2 24 20 - 29 mmol/L   Calcium 9.2 8.7 - 10.2 mg/dL   Total  Protein 7.4 6.0 - 8.5 g/dL   Albumin 4.6 3.8 - 4.8 g/dL   Globulin, Total 2.8 1.5 - 4.5 g/dL   Albumin/Globulin Ratio 1.6 1.2 - 2.2   Bilirubin Total 0.4 0.0 - 1.2 mg/dL   Alkaline Phosphatase 109 44 - 121 IU/L   AST 24 0 - 40 IU/L   ALT 28 0 - 32 IU/L  Hemoglobin A1c  Result Value Ref Range   Hgb A1c MFr Bld 5.6 4.8 - 5.6 %   Est. average glucose Bld gHb Est-mCnc 114 mg/dL  Insulin, random  Result Value Ref Range   INSULIN 30.2 (H) 2.6 - 24.9 uIU/mL  Lipid panel  Result Value Ref Range   Cholesterol, Total 218 (H) 100 - 199 mg/dL   Triglycerides 188 (H) 0 - 149 mg/dL   HDL 41 >39 mg/dL   VLDL Cholesterol Cal 34 5 - 40 mg/dL   LDL Chol Calc (NIH) 143 (H) 0 - 99 mg/dL   Chol/HDL Ratio 5.3 (H) 0.0 - 4.4 ratio  VITAMIN D 25 Hydroxy (Vit-D Deficiency, Fractures)  Result Value Ref Range   Vit D, 25-Hydroxy 42.7 30.0 - 100.0 ng/mL        Current Outpatient Medications:    albuterol (VENTOLIN HFA) 108 (90 Base) MCG/ACT inhaler, Inhale 2 puffs into the lungs every 6 (six) hours as needed for wheezing., Disp: , Rfl:    busPIRone (BUSPAR) 10 MG tablet, Take 1 tablet (10 mg total) by mouth 3 (three) times daily., Disp: 270 tablet, Rfl: 3   butalbital-acetaminophen-caffeine (FIORICET) 50-325-40 MG tablet, Take 1 tablet by  mouth every 6 (six) hours as needed for headache., Disp: 30 tablet, Rfl: 0   cephALEXin (KEFLEX) 500 MG capsule, Take 1 capsule (500 mg total) by mouth 2 (two) times daily for 5 days., Disp: 10 capsule, Rfl: 0   DULoxetine (CYMBALTA) 60 MG capsule, Take 1 capsule (60 mg total) by mouth 2 (two) times daily., Disp: 90 capsule, Rfl: 3   EPINEPHrine (EPIPEN) 0.3 mg/0.3 mL DEVI, Inject 0.3 mLs (0.3 mg total) into the muscle once., Disp: 1 Device, Rfl: 0   gabapentin (NEURONTIN) 300 MG capsule, Take 1 capsule (300 mg total) by mouth 3 (three) times daily., Disp: 270 capsule, Rfl: 3   hydrochlorothiazide (HYDRODIURIL) 25 MG tablet, Take 1 tablet (25 mg total) by mouth daily., Disp: 90 tablet, Rfl: 3   loratadine (CLARITIN) 10 MG tablet, Take 10 mg by mouth daily., Disp: , Rfl:    propranolol (INDERAL) 20 MG tablet, Take 1 tablet (20 mg total) by mouth 3 (three) times daily as needed (anxiety)., Disp: 90 tablet, Rfl: 3   tirzepatide (MOUNJARO) 5 MG/0.5ML Pen, Inject 5 mg into the skin once a week., Disp: 6 mL, Rfl: 1   traZODone (DESYREL) 100 MG tablet, Take 1 tablet (100 mg total) by mouth at bedtime., Disp: 90 tablet, Rfl: 3   Vitamin D, Ergocalciferol, (DRISDOL) 1.25 MG (50000 UNIT) CAPS capsule, Take 1 capsule (50,000 Units total) by mouth 2 (two) times a week., Disp: 24 capsule, Rfl: 0   Probiotic Product (PROBIOTIC BLEND PO), Probiotic Blend, Disp: , Rfl:     Assessment & Plan:  Body mass index is 50.55 kg/m. Is on mounjaro 5 mg q weekly for such   Sleep apnea Is on cpap per sleep med uses this everyday   HLD TG 188 last checked in sept , LDL at 143.  recheck FLP, check LFT's work on diet, SE of meds explained to pt. low  fat and high fiber diet explained to pt.   HTN : is on hctz for such and for lower ext edema.  Continue current meds.  Medication compliance emphasised. pt advised to keep Bp logs. Pt verbalised understanding of the same. Pt to have a low salt diet . Exercise to reach a  goal of at least 150 mins a week.  lifestyle modifications explained and pt understands importance of the above. Under good control on current regimen. Continue current regimen. Continue to monitor. Call with any concerns. Refills given. Labs drawn today.  Back pain :  To have surgery for ruptured ?  disc L3 - L4   Anxiety is on buspar and cymbalta - has a ho fibromyalgia.  Problem List Items Addressed This Visit   None Visit Diagnoses     Edema, unspecified type    -  Primary   Relevant Orders   Ambulatory referral to Cardiology   CBC with Differential/Platelet   Comprehensive metabolic panel   Lipid panel   Urinalysis, Routine w reflex microscopic   TSH   Bayer DCA Hb A1c Waived (STAT)        Orders Placed This Encounter  Procedures   CBC with Differential/Platelet   Comprehensive metabolic panel   Lipid panel   Urinalysis, Routine w reflex microscopic   TSH   Bayer DCA Hb A1c Waived (STAT)   Ambulatory referral to Cardiology     Meds ordered this encounter  Medications   cephALEXin (KEFLEX) 500 MG capsule    Sig: Take 1 capsule (500 mg total) by mouth 2 (two) times daily for 5 days.    Dispense:  10 capsule    Refill:  0     Follow up plan: No follow-ups on file.

## 2021-11-23 ENCOUNTER — Ambulatory Visit: Payer: BC Managed Care – PPO | Admitting: Cardiology

## 2021-11-23 VITALS — BP 130/90 | HR 87 | Ht 64.5 in | Wt 297.0 lb

## 2021-11-23 DIAGNOSIS — R609 Edema, unspecified: Secondary | ICD-10-CM

## 2021-11-23 DIAGNOSIS — J4520 Mild intermittent asthma, uncomplicated: Secondary | ICD-10-CM

## 2021-11-23 DIAGNOSIS — Z6841 Body Mass Index (BMI) 40.0 and over, adult: Secondary | ICD-10-CM

## 2021-11-23 DIAGNOSIS — Z9989 Dependence on other enabling machines and devices: Secondary | ICD-10-CM

## 2021-11-23 DIAGNOSIS — G4733 Obstructive sleep apnea (adult) (pediatric): Secondary | ICD-10-CM

## 2021-11-23 DIAGNOSIS — E782 Mixed hyperlipidemia: Secondary | ICD-10-CM | POA: Diagnosis not present

## 2021-11-23 MED ORDER — FUROSEMIDE 20 MG PO TABS: 20.0000 mg | ORAL_TABLET | ORAL | 0 refills | Status: DC | PRN

## 2021-11-23 NOTE — Patient Instructions (Signed)
Medication Instructions:  Your physician has recommended you make the following change in your medication:   START taking furosemide (Lasix) 20 mg as needed in place of your HCTZ when you have swelling  *If you need a refill on your cardiac medications before your next appointment, please call your pharmacy*   Lab Work:  None ordered  If you have labs (blood work) drawn today and your tests are completely normal, you will receive your results only by: MyChart Message (if you have MyChart) OR A paper copy in the mail If you have any lab test that is abnormal or we need to change your treatment, we will call you to review the results.   Testing/Procedures:  Echocardiogram - Your physician has requested that you have an echocardiogram next week or early the following week. Echocardiography is a painless test that uses sound waves to create images of your heart. It provides your doctor with information about the size and shape of your heart and how well your hearts chambers and valves are working. This procedure takes approximately one hour. There are no restrictions for this procedure.   Your provider has ordered a lower extremity venous reflux doppler. This will look at your blood flow in your legs. There is no preparation for this test.   Follow-Up: At [facility], you and your health needs are our priority.  As part of our continuing mission to provide you with exceptional heart care, we have created designated Provider Care Teams.  These Care Teams include your primary Cardiologist (physician) and Advanced Practice Providers (APPs -  Physician Assistants and Nurse Practitioners) who all work together to provide you with the care you need, when you need it.  We recommend signing up for the patient portal called "MyChart".  Sign up information is provided on this After Visit Summary.  MyChart is used to connect with patients for Virtual Visits (Telemedicine).  Patients are able to view  lab/test results, encounter notes, upcoming appointments, etc.  Non-urgent messages can be sent to your provider as well.   To learn more about what you can do with MyChart, go to [email or web address].    Your next appointment:   6 week(s)  The format for your next appointment:   In Person  Provider:   You may see Bryan Lemma, MD or one of the following Advanced Practice Providers on your designated Care Team:   Nicolasa Ducking, NP Eula Listen, PA-C Cadence Fransico Michael, PA-C :1}    Other Instructions N/A

## 2021-11-23 NOTE — Progress Notes (Signed)
Primary Care Provider: [name], MD Sleep medicine: [name] [facility] Cardiologist: None Electrophysiologist: None  Clinic Note: Chief Complaint  Patient presents with   NEW patient-BLLE edema    Preop clearance for back surgery   Pre-op Exam    Back surgery   ===================================  ASSESSMENT/PLAN   Problem List Items Addressed This Visit       Cardiology Problems   Mixed hyperlipidemia (Chronic)    LDL 143 with total cholesterol 218 controversial as 188 mm morbidly obese patient -> would like to at least for an LDL less than 100.  Once we have addressed the issue of her edema and cleared her for surgery, can consider the possibility of risk stratification with coronary calcium score since she does have a family history of CAD.      Relevant Medications   EPINEPHrine 0.3 mg/0.3 mL IJ SOAJ injection   furosemide (LASIX) 20 MG tablet     Other   Morbid obesity with BMI of 50.0-59.9, adult (HCC) (Chronic)    Super morbidly obese.  Clearly leading related to deconditioning contributing to her back pain.  I suspect this also is playing a part in edema and OSA.  Was recently started on GLP-1 agonist with intention for weight loss.       Relevant Orders   EKG 12-Lead   OSA on CPAP (Chronic)    She does sleep with CPAP machine, OSA with potentially OHS is on possible cause or source of her lower extremity edema.  Will check 2D echocardiogram to assess right ventricular pressures      Relevant Orders   EKG 12-Lead   Mild intermittent asthma without complication (Chronic)    Based on her description, it seems somewhat seasonal associated with allergies.  However it is quite likely that some of her exertional dyspnea is exacerbation of reactive airways disease.      Edema - Primary (Chronic)    This seems to be the most recent and pressing issue.  She denies any real symptoms of PND orthopnea and does not have classic exertional dyspnea  exacerbation that would suggest that this is CHF.  The fact that the swelling goes down at night is also not consistent with CHF.  Will check 2D echocardiogram to assess systolic and diastolic function as well as RV pressures. My suspicion is that her edema is related to venous insufficiency-we will also check lower extremity venous Dopplers to assess for venous reflux.      Relevant Medications   furosemide (LASIX) 20 MG tablet   Other Relevant Orders   EKG 12-Lead   ECHOCARDIOGRAM COMPLETE   VAS [place] LOWER EXTREMITY VENOUS REFLUX    ===================================  HPI:    [name] is a morbidly obese (BMI 50.5) 46 y.o. female with history of HTN, HLD, Chronic Back Pain/DJD, Anxiety and Fibromyalgia, along with OSA-CPAP who is being seen today for the evaluation of LOWER EXTREMITY EDEMA at the request of [name], [name], MD.  [name] was just seen yesterday by [name] to establish care.  She was started on Mounjaro [employer]) about a month ago and has lost 10 pounds already.  She was noted some lower extremity edema-indicates that HCTZ was being used for both HTN and edema.  Discussed low-salt diet..  Other major complaint was back pain-planning for surgery for L3-L4 ruptured disc.Marland Kitchen  Recent Hospitalizations: None  Reviewed  CV studies:    The following studies were reviewed today: (if available,  images/films reviewed: From Epic Chart or Care Everywhere) None:   Interval History:   [name] presents here today for evaluation of her lower leg swelling.  She has just gone back to taking HCTZ after a short stint of using Lasix.  She did not particularly taking Lasix.  She did also take the potassium.  It did however improve her leg swelling.  She describes having significant leg swelling with redness tenderness pain.  However did not appear to there is any evidence of cellulitis. She tells me that she has some days where the swelling is much worse than  others.  Today [name] is status usually is.  She says that she is on her feet a lot during the day but usually also sitting quite a bit.  It is near the end of the day and if the swelling becomes worse.  It does go down when she puts her feet up to sleep at night.  Unfortunately because of chronic back pain she is not able to elevate her feet as much as he would like to the day.  She does have sleep apnea and uses CPAP, therefore he has not really assessed whether or not she has any orthopnea.  Sometimes is difficult for her to lie down flat simply because of her back.  With CPAP on she does not have any PND.  She does have some exertional dyspnea but is not sure if this is related to her back limiting her or deconditioning because of her back pain.  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  She denies any chest pain with exertion, just gets very short of breath and fatigue.  She is open to have back surgery by Dr. [name].  She is hoping that she can have clearance for the surgery, they are concerned about the edema and pain possible heart failure.  She does have positional vertigo with maybe a little bit of orthostasis. She may have a few skipped beats here and there but denies any rapid irregular heartbeats palpitations. She has lost a little bit of weight since starting the GLP-1 agonist.  [name] lipids poor.  She thinks that this was started as much for prediabetes as weight but is happy with weight loss.   CV Review of Symptoms (Summary) Cardiovascular ROS: positive for - dyspnea on exertion, edema, shortness of breath, and would probably get orthopneic without CPAP.  Very rare palpitations; orthostatic dizziness and vertigo-positional negative for - chest pain, irregular heartbeat, paroxysmal nocturnal dyspnea, rapid  heart rate, or syncope/near syncope or TIA/LCx  REVIEWED OF SYSTEMS   Review of Systems  Constitutional:  Positive for malaise/fatigue (Somewhat deconditioned) and weight loss (Started to lose some weight on Mounjaro).  HENT:  Negative for congestion.   Respiratory:  Positive for shortness of breath (Has baseline underlying asthma with allergic rhinitis triggering the asthma.Marland Kitchen). Negative for cough and wheezing.        Per HPI; her asthma/allergies are not active right now.  Cardiovascular:        Per HPI  Gastrointestinal:  Negative for blood in stool and melena.  Genitourinary:  Negative for hematuria.  Musculoskeletal:  Positive for back pain and joint pain (Her knees and hips bother her some but mostly her back.). Negative for myalgias.  Neurological:  Positive  for dizziness (Vertigo) and weakness (Sometimes her legs feel weak because of back pain).  Psychiatric/Behavioral:  Negative for memory loss. The patient is nervous/anxious and has insomnia (Has a hard time getting good sleep because of her back.).    I have reviewed and (if needed) personally updated the patient's problem list, medications, allergies, past medical and surgical history, social and family history.   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    Generalized headaches    GERD (gastroesophageal reflux disease)    H/O laparoscopic adjustable gastric banding 07/29/2013   10/17/10    History of removal of laparoscopic gastric banding device 02/03/2018   Irregular menses    Joint pain    Lactose intolerance    Lower extremity edema    Migraines    Muscle pain    Obesity (BMI 30-39.9)    Osteoarthritis    Other allergic rhinitis 05/24/2020   Ovarian retention cyst 11/13/2012   Sleep apnea    SOB (shortness of breath)    Stomach ulcer    Swallowing difficulty    Vision problems    Vitamin D deficiency     PAST SURGICAL HISTORY   Past Surgical History:  Procedure Laterality Date   ABDOMINAL HYSTERECTOMY     ACHILLES TENDON LENGTHENING  1989   ANKLE GANGLION CYST EXCISION  02/2012   left ankle   ANTERIOR LAT LUMBAR FUSION Right 05/11/2019   Procedure: ANTERIOR LATERAL LUMBAR INTERBODY FUSION, LATERAL INSTRUMENTATION, RIGHT LUMBAR TWO- LUMBAR THREE;  Surgeon: Newman Pies, MD;  Location: [place];  Service: Neurosurgery;  Laterality: Right;  ANTERIOR LATERAL LUMBAR INTERBODY FUSION, LATERAL INSTRUMENTATION, RIGHT LUMBAR TWO- LUMBAR THREE   BIOPSY  03/16/2019   Procedure: BIOPSY;  Surgeon: Daneil Dolin, MD;  [facility];  Service: Endoscopy;;  Gastric    COLONOSCOPY WITH PROPOFOL N/A 03/16/2019   Dr. Gala Romney: Normal terminal ileum, 15 cm.  Normal colon.  Next colonoscopy 10 years   DILATION AND CURETTAGE OF UTERUS  2003   ENTEROSCOPY N/A 04/02/2019   Dr. Gala Romney: bulbar erosions, jejunal ulcerations, attempted biopsy but scope fell backwards and lesions cannot be reidentified.  No evidence of ampullary lesion or mass.   ESOPHAGOGASTRODUODENOSCOPY (EGD) WITH PROPOFOL N/A 03/16/2019   Dr. Gala Romney: Large hiatal hernia, Cameron/antral erosions, duodenal erosions.  Gastric biopsy showed reactive gastropathy, mild chronic gastritis, negative for H. pylori   FEET SURGERY  1990   INSERTION OF BONE GRAFT IN FEET   GIVENS CAPSULE STUDY  N/A 03/16/2019   Procedure: GIVENS CAPSULE STUDY;  Surgeon: Daneil Dolin, MD;  [facility]: [facility];  Service: Endoscopy;  Laterality: N/A;   LAPAROSCOPIC GASTRIC BANDING  10/17/10   Dr Randall Hiss  [place]; [facility]   LAPAROSCOPIC REPAIR AND REMOVAL OF GASTRIC BAND  01/2018   LIPOMA EXCISION  02/2006   abdomen   SPINE SURGERY N/A    Phreesia 11/10/2020    Immunization History  Administered Date(s) Administered   Influenza Inj Mdck Quad Pf 09/23/2018, 07/26/2019   Influenza,inj,Quad PF,6+ Mos 07/15/2020   Influenza,inj,quad, With Preservative 08/29/2017   Influenza-Unspecified 09/12/2018   Moderna Sars-Covid-2 Vaccination 12/12/2019, 01/07/2020, 09/16/2020   Tdap 01/13/2018    MEDICATIONS/ALLERGIES   Current Meds  Medication Sig   albuterol (VENTOLIN HFA) 108 (90 Base) MCG/ACT inhaler Inhale 2 puffs into the lungs every 6 (six) hours as needed for wheezing.   busPIRone (BUSPAR) 10 MG tablet Take 1 tablet (10 mg total) by mouth 3 (three) times daily.   butalbital-acetaminophen-caffeine (FIORICET) 50-325-40 MG tablet Take 1 tablet by mouth every 6 (six) hours as needed for headache.   DULoxetine (CYMBALTA) 60 MG capsule Take 1 capsule (60 mg total) by mouth 2 (two) times daily.   EPINEPHrine 0.3 mg/0.3 mL IJ SOAJ injection Inject 0.3 mg into the muscle as needed for anaphylaxis.   furosemide (LASIX) 20 MG tablet Take 1 tablet (20 mg total) by mouth as needed for edema.   gabapentin (NEURONTIN) 300 MG capsule Take 1 capsule (300 mg total) by mouth 3 (three) times daily.   hydrochlorothiazide (HYDRODIURIL) 25 MG tablet Take 1 tablet (25 mg total) by mouth daily.   loratadine (CLARITIN) 10 MG tablet Take 10 mg by mouth daily.   Probiotic Product (PROBIOTIC BLEND PO) daily.   propranolol (INDERAL) 20 MG tablet Take 1 tablet (20 mg total) by mouth 3 (three) times daily as needed (anxiety).   tirzepatide [facility](MOUNJARO) 5 MG/0.5ML Pen Inject 5 mg into the skin once a week.   traZODone (DESYREL)  100 MG tablet Take 1 tablet (100 mg total) by mouth at bedtime.   Vitamin D, Ergocalciferol, (DRISDOL) 1.25 MG (50000 UNIT) CAPS capsule Take 1 capsule (50,000 Units total) by mouth 2 (two) times a week.    Allergies  Allergen Reactions   Cashew Nut Oil Anaphylaxis   Cauliflower [Brassica Oleracea] Anaphylaxis   Whey Anaphylaxis   Augmentin [Amoxicillin-Pot Clavulanate] Diarrhea and Nausea And Vomiting    Has patient had a PCN reaction causing immediate rash, facial/tongue/throat swelling, SOB or lightheadedness with hypotension: No Has patient had a PCN reaction causing severe rash involving mucus membranes or skin necrosis: No Has patient had a PCN reaction that required hospitalization: No Has patient had a PCN reaction occurring within the last 10 years: Unknown If all of the above answers are "NO", then may proceed with Cephalosporin use.    Beeswax Itching and Swelling   Honey Itching and Swelling   Orange Fruit [Citrus] Other (See Comments)    Migraines    Wheat Bran Diarrhea   Bee Pollen Swelling   Gluten Meal Other (See Comments)    Severe GI upset   Milk-Related Compounds    Other     epidural steroid injection - severe headaches, high fever   Codeine Rash   Tree Extract Itching    Tee tree oil    SOCIAL HISTORY/FAMILY HISTORY   Reviewed in Epic:   Social History   Tobacco Use   Smoking status: Never   Smokeless tobacco: Never  Vaping Use   Vaping Use: Never used  Substance Use Topics   Alcohol use: No   Drug use: No   Social History   Social History Narrative  Lives with husband married 41 years Dec 2021      Oldest is 60- son lives in [place]   12 daughter and 43 son year olds in home      Cat: Snickers       Enjoy: cooking some, puzzles, reading       Diet: eats all food groups outside allergies    Caffeine: 2 cans of diet soda daily and some tea   Water: 3 24 oz containers daily       Wears seat belt    Does not use phone while driving     Engineer, manufacturing systems -none          Family History  Problem Relation Age of Onset   Hypertension Mother    Hyperlipidemia Mother    Diabetes Mother        pre-diabetes   Allergic rhinitis Mother    Asthma Mother    Urticaria Mother    Hypertension Father    Hyperlipidemia Father    Heart disease Father    Diabetes Father    Cancer Father        skin   Allergic rhinitis Father    Asthma Father    Urticaria Father    Diabetes Sister    Hypertension Sister    Allergic rhinitis Sister    Asthma Sister    Urticaria Sister    Pleurisy Son    Migraines Son    Heart disease Maternal Grandmother        V. Fib arrest   Cancer Maternal Grandfather        lung and skin   Colon cancer Maternal Grandfather    Cancer Paternal Grandfather        small bowel cancer and skin   Colon polyps Other        aunt and uncle   Eczema Neg Hx     OBJCTIVE -PE, EKG, labs   Wt Readings from Last 3 Encounters:  11/23/21 297 lb (134.7 kg)  11/22/21 299 lb (135.6 kg)  11/01/21 (!) 304 lb (137.9 kg)    Physical Exam: BP 130/90 (BP Location: Left Arm, Patient Position: Sitting, Cuff Size: Large)    Pulse 87    Ht 5' 4.5" (1.638 m)    Wt 297 lb (134.7 kg)    LMP 11/07/2012    SpO2 97%    BMI 50.19 kg/m  Physical Exam Constitutional:      General: She is not in acute distress.    Appearance: She is not toxic-appearing.     Comments: Super morbidly obese; well-groomed.  HENT:     Head: Normocephalic and atraumatic.  Neck:     Vascular: No carotid bruit or JVD.  Cardiovascular:     Rate and Rhythm: Normal rate and regular rhythm. No extrasystoles are present.    Chest Wall: PMI is not displaced (Unable to palpate).     Pulses: Decreased pulses (Diminished due to body habitus).     Heart sounds: S1 normal and S2 normal. Heart sounds are distant. No murmur heard.   No friction rub. No gallop.  Pulmonary:     Effort: Pulmonary effort is normal. No respiratory  distress.     Breath sounds: Normal breath sounds. No wheezing, rhonchi or rales.  Musculoskeletal:        General: No swelling (trivial).     Cervical back: Normal range of motion and neck supple.  Neurological:  General: No focal deficit present.     Mental Status: She is alert and oriented to person, place, and time.  Psychiatric:        Mood and Affect: Mood normal.        Behavior: Behavior normal.        Thought Content: Thought content normal.        Judgment: Judgment normal.     Adult ECG Report  Rate: 87 ;  Rhythm: normal sinus rhythm and low voltage.  Nonspecific ST and T wave changes. ; Normal axis, intervals durations.  Narrative Interpretation: Borderline EKG.  Recent Labs: Reviewed Lab Results  Component Value Date   CHOL 218 (H) 07/26/2021   HDL 41 07/26/2021   LDLCALC 143 (H) 07/26/2021   TRIG 188 (H) 07/26/2021   CHOLHDL 5.3 (H) 07/26/2021   Lab Results  Component Value Date   CREATININE 1.00 07/26/2021   BUN 10 07/26/2021   NA 139 07/26/2021   K 3.6 07/26/2021   CL 97 07/26/2021   CO2 24 07/26/2021   CBC Latest Ref Rng & Units 07/26/2021 02/22/2021 09/12/2020  WBC 3.4 - 10.8 x10E3/uL 7.9 8.4 7.1  Hemoglobin 11.1 - 15.9 g/dL 14.6 13.5 14.2  Hematocrit 34.0 - 46.6 % 44.4 40.2 43.1  Platelets 150 - 450 x10E3/uL 318 280 296    Lab Results  Component Value Date   HGBA1C 5.6 07/26/2021   Lab Results  Component Value Date   TSH 1.750 05/13/2020    ==================================================  COVID-19 Education: The signs and symptoms of COVID-19 were discussed with the patient and how to seek care for testing (follow up with PCP or arrange E-visit).    I spent a total of 24 minutes with the patient spent in direct patient consultation.  Additional time spent with chart review  / charting (studies, outside notes, etc): 22 min Total Time: 46 min  Current medicines are reviewed at length with the patient today.  (+/- concerns) n/a  This  visit occurred during the SARS-CoV-2 public health emergency.  Safety protocols were in place, including screening questions prior to the visit, additional usage of staff PPE, and extensive cleaning of exam room while observing appropriate contact time as indicated for disinfecting solutions.  Notice: This dictation was prepared with Dragon dictation along with smart phrase technology. Any transcriptional errors that result from this process are unintentional and may not be corrected upon review.   Studies Ordered:  Orders Placed This Encounter  Procedures   EKG 12-Lead   ECHOCARDIOGRAM COMPLETE   VAS [place] LOWER EXTREMITY VENOUS REFLUX    Patient Instructions / Medication Changes & Studies & Tests Ordered   Patient Instructions  Medication Instructions:  Your physician has recommended you make the following change in your medication:   START taking furosemide (Lasix) 20 mg as needed in place of your HCTZ when you have swelling  *If you need a refill on your cardiac medications before your next appointment, please call your pharmacy*   Lab Work:  None ordered  If you have labs (blood work) drawn today and your tests are completely normal, you will receive your results only by: [place] (if you have MyChart) OR A paper copy in the mail If you have any lab test that is abnormal or we need to change your treatment, we will call you to review the results.   Testing/Procedures:  Echocardiogram - Your physician has requested that you have an echocardiogram next week or early the following week.  Echocardiography is a painless test that uses sound waves to create images of your heart. It provides your doctor with information about the size and shape of your heart and how well your hearts chambers and valves are working. This procedure takes approximately one hour. There are no restrictions for this procedure.   Your provider has ordered a lower extremity venous reflux doppler. This  will look at your blood flow in your legs. There is no preparation for this test.   Follow-Up: At [facility], you and your health needs are our priority.  As part of our continuing mission to provide you with exceptional heart care, we have created designated Provider Care Teams.  These Care Teams include your primary Cardiologist (physician) and Advanced Practice Providers (APPs -  Physician Assistants and Nurse Practitioners) who all work together to provide you with the care you need, when you need it.  We recommend signing up for the patient portal called "MyChart".  Sign up information is provided on this After Visit Summary.  MyChart is used to connect with patients for Virtual Visits (Telemedicine).  Patients are able to view lab/test results, encounter notes, upcoming appointments, etc.  Non-urgent messages can be sent to your provider as well.   To learn more about what you can do with MyChart, go to [email or web address].    Your next appointment:   6 week(s)  The format for your next appointment:   In Person  Provider:   You may see Glenetta Hew, MD or one of the following Advanced Practice Providers on your designated Care Team:   Murray Hodgkins, NP Christell Faith, PA-C Cadence Kathlen Mody, PA-C :1}    Other Instructions N/A    Glenetta Hew, M.D., M.S. Interventional Cardiologist   Pager # [phone] Phone # [phone] [address] [place], [place] [address]   Thank you for choosing [facility] [place]!!

## 2021-11-24 ENCOUNTER — Encounter: Payer: Self-pay | Admitting: Cardiology

## 2021-11-24 NOTE — Assessment & Plan Note (Signed)
Based on her description, it seems somewhat seasonal associated with allergies.  However it is quite likely that some of her exertional dyspnea is exacerbation of reactive airways disease.

## 2021-11-24 NOTE — Assessment & Plan Note (Signed)
This seems to be the most recent and pressing issue.  She denies any real symptoms of PND orthopnea and does not have classic exertional dyspnea exacerbation that would suggest that this is CHF.  The fact that the swelling goes down at night is also not consistent with CHF.   Will check 2D echocardiogram to assess systolic and diastolic function as well as RV pressures.  My suspicion is that her edema is related to venous insufficiency-we will also check lower extremity venous Dopplers to assess for venous reflux.

## 2021-11-24 NOTE — Assessment & Plan Note (Signed)
LDL 143 with total cholesterol 218 controversial as 188 mm morbidly obese patient -> would like to at least for an LDL less than 100.  Once we have addressed the issue of her edema and cleared her for surgery, can consider the possibility of risk stratification with coronary calcium score since she does have a family history of CAD.

## 2021-11-24 NOTE — Assessment & Plan Note (Signed)
Super morbidly obese.  Clearly leading related to deconditioning contributing to her back pain.  I suspect this also is playing a part in edema and OSA.  Was recently started on GLP-1 agonist with intention for weight loss.

## 2021-11-24 NOTE — Assessment & Plan Note (Signed)
She does sleep with CPAP machine, OSA with potentially OHS is on possible cause or source of her lower extremity edema.  Will check 2D echocardiogram to assess right ventricular pressures

## 2021-11-28 ENCOUNTER — Ambulatory Visit (INDEPENDENT_AMBULATORY_CARE_PROVIDER_SITE_OTHER): Payer: BC Managed Care – PPO | Admitting: Family Medicine

## 2021-11-28 ENCOUNTER — Other Ambulatory Visit: Payer: Self-pay

## 2021-11-28 ENCOUNTER — Encounter (INDEPENDENT_AMBULATORY_CARE_PROVIDER_SITE_OTHER): Payer: Self-pay | Admitting: Family Medicine

## 2021-11-28 VITALS — BP 118/78 | HR 88 | Temp 97.6°F | Ht 65.0 in | Wt 289.0 lb

## 2021-11-28 DIAGNOSIS — E669 Obesity, unspecified: Secondary | ICD-10-CM | POA: Diagnosis not present

## 2021-11-28 DIAGNOSIS — M5116 Intervertebral disc disorders with radiculopathy, lumbar region: Secondary | ICD-10-CM | POA: Diagnosis not present

## 2021-11-28 DIAGNOSIS — G4733 Obstructive sleep apnea (adult) (pediatric): Secondary | ICD-10-CM | POA: Diagnosis not present

## 2021-11-28 DIAGNOSIS — Z6841 Body Mass Index (BMI) 40.0 and over, adult: Secondary | ICD-10-CM

## 2021-11-28 DIAGNOSIS — R7303 Prediabetes: Secondary | ICD-10-CM

## 2021-11-28 DIAGNOSIS — Z9989 Dependence on other enabling machines and devices: Secondary | ICD-10-CM

## 2021-11-28 MED ORDER — MOUNJARO 7.5 MG/0.5ML ~~LOC~~ SOAJ: 7.5000 mg | SUBCUTANEOUS | 2 refills | Status: DC

## 2021-11-29 NOTE — Progress Notes (Signed)
Chief Complaint:   OBESITY [name] is here to discuss her progress with her obesity treatment plan along with follow-up of her obesity related diagnoses. See Medical Weight Management Flowsheet for complete bioelectrical impedance results.  Today's visit was #: 18 Starting weight: 306 lbs Starting date: 05/19/2020 Weight change since last visit: 15 lbs Total lbs lost to date: 17 lbs Total weight loss percentage to date: -5.56%  Nutrition Plan: Keeping a food journal and adhering to recommended goals of 1500 calories and 100 grams of protein daily for 75% of the time. Activity: Cardio/strength training for 20 minutes 1-2 times per week. Anti-obesity medications: Mounjaro 5 mg subcutaneously weekly. Reported side effects: None.  Interim History: [name] had an MRI on 11/10/2021 that showed: 1. At L3-4 there is a large central/left paracentral disc extrusion with mass effect on the left intraspinal L4 nerve root. Mild bilateral facet arthropathy. 2. Anterior lumbar interbody fusion at L2-3 without foraminal or central canal stenosis.  She is scheduled for anterior lateral lumbar interbody fusion with Dr. [name] on 12/13/2021.  She expects to be at home for 3-6 weeks.  She is scheduled for an echo this Thursday, 11/30/2021.  Assessment/Plan:   1. Prediabetes, with polyphagia At goal. Goal is HgbA1c < 5.7.  Medication: Mounjaro 5 mg subcutaneously weekly.    Plan:  Increase Mounjaro to 7.5 mg subcutaneously weekly. She will continue to focus on protein-rich, low simple carbohydrate foods. We reviewed the importance of hydration, regular exercise for stress reduction, and restorative sleep.   Lab Results  Component Value Date   HGBA1C 5.6 07/26/2021   Lab Results  Component Value Date   INSULIN 30.2 (H) 07/26/2021   INSULIN 40.6 (H) 09/12/2020   - Increase tirzepatide (MOUNJARO) 7.5 MG/0.5ML Pen; Inject 7.5 mg into the skin once a week.  Dispense: 2 mL; Refill: 2  2. Lumbar  disc prolapse with compression radiculopathy [name] is scheduled for anterior lateral lumbar interbody fusion with Dr. [name] on 12/13/2021. We will continue to monitor symptoms as they relate to her weight loss journey. This issue directly impacts care plan for optimization of BMI and metabolic health as it impacts the patient's ability to make lifestyle changes.  3. OSA on CPAP OSA is a cause of systemic hypertension and is associated with an increased incidence of stroke, heart failure, atrial fibrillation, and coronary heart disease. Severe OSA increases all-cause mortality and cardiovascular mortality.   Goal: Treatment of OSA via CPAP compliance and weight loss. Plasma ghrelin levels (appetite or "hunger hormone") are significantly higher in OSA patients than in BMI-matched controls, but decrease to levels similar to those of obese patients without OSA after CPAP treatment.  Weight loss improves OSA by several mechanisms, including reduction in fatty tissue in the throat (i.e. parapharyngeal fat) and the tongue. Loss of abdominal fat increases mediastinal traction on the upper airway making it less likely to collapse during sleep. Studies have also shown that compliance with CPAP treatment improves leptin (hunger inhibitory hormone) imbalance.  4. Obesity with current BMI of 48.1  Course: [name] is currently in the action stage of change. As such, her goal is to continue with weight loss efforts.   Nutrition goals: She has agreed to keeping a food journal and adhering to recommended goals of 1500 calories and 100 grams of protein.   Exercise goals:  As is.  Behavioral modification strategies: increasing lean protein intake, decreasing simple carbohydrates, increasing vegetables, and increasing water intake.  [name] has agreed to  follow-up with our clinic in 6 weeks. She was informed of the importance of frequent follow-up visits to maximize her success with intensive lifestyle  modifications for her multiple health conditions.   Objective:   Blood pressure 118/78, pulse 88, temperature 97.6 F (36.4 C), temperature source Oral, height 5\' 5"  (1.651 m), weight 289 lb (131.1 kg), last menstrual period 11/07/2012, SpO2 96 %. Body mass index is 48.09 kg/m.  General: Cooperative, alert, well developed, in no acute distress. HEENT: Conjunctivae and lids unremarkable. Cardiovascular: Regular rhythm.  Lungs: Normal work of breathing. Neurologic: No focal deficits.   Lab Results  Component Value Date   CREATININE 1.00 07/26/2021   BUN 10 07/26/2021   NA 139 07/26/2021   K 3.6 07/26/2021   CL 97 07/26/2021   CO2 24 07/26/2021   Lab Results  Component Value Date   ALT 28 07/26/2021   AST 24 07/26/2021   ALKPHOS 109 07/26/2021   BILITOT 0.4 07/26/2021   Lab Results  Component Value Date   HGBA1C 5.6 07/26/2021   HGBA1C 5.5 02/22/2021   HGBA1C 5.9 (H) 09/12/2020   HGBA1C 5.7 (H) 05/13/2020   HGBA1C 5.4 02/25/2013   Lab Results  Component Value Date   INSULIN 30.2 (H) 07/26/2021   INSULIN 40.6 (H) 09/12/2020   Lab Results  Component Value Date   TSH 1.750 05/13/2020   Lab Results  Component Value Date   CHOL 218 (H) 07/26/2021   HDL 41 07/26/2021   LDLCALC 143 (H) 07/26/2021   TRIG 188 (H) 07/26/2021   CHOLHDL 5.3 (H) 07/26/2021   Lab Results  Component Value Date   VD25OH 42.7 07/26/2021   VD25OH 51.5 02/22/2021   VD25OH 24.4 (L) 09/12/2020   Lab Results  Component Value Date   WBC 7.9 07/26/2021   HGB 14.6 07/26/2021   HCT 44.4 07/26/2021   MCV 94 07/26/2021   PLT 318 07/26/2021   Lab Results  Component Value Date   IRON 68 02/22/2021   TIBC 315 02/22/2021   FERRITIN 119 02/22/2021   Attestation Statements:   Reviewed by clinician on day of visit: allergies, medications, problem list, medical history, surgical history, family history, social history, and previous encounter notes.  I, 02/24/2021, CMA, am acting as  transcriptionist for Insurance claims handler, DO  I have reviewed the above documentation for accuracy and completeness, and I agree with the above. -  Helane Rima, DO, MS, FAAFP, DABOM - Family and Bariatric Medicine.

## 2021-11-30 ENCOUNTER — Other Ambulatory Visit: Payer: Self-pay

## 2021-11-30 ENCOUNTER — Ambulatory Visit (INDEPENDENT_AMBULATORY_CARE_PROVIDER_SITE_OTHER): Payer: BC Managed Care – PPO

## 2021-11-30 DIAGNOSIS — R609 Edema, unspecified: Secondary | ICD-10-CM

## 2021-11-30 LAB — ECHOCARDIOGRAM COMPLETE
AR max vel: 1.82 cm2
AV Area VTI: 2.05 cm2
AV Area mean vel: 1.97 cm2
AV Mean grad: 5 mmHg
AV Peak grad: 9.5 mmHg
Ao pk vel: 1.54 m/s
Area-P 1/2: 3.21 cm2
S' Lateral: 2.4 cm

## 2021-12-12 ENCOUNTER — Encounter (INDEPENDENT_AMBULATORY_CARE_PROVIDER_SITE_OTHER): Payer: Self-pay

## 2021-12-27 ENCOUNTER — Other Ambulatory Visit (INDEPENDENT_AMBULATORY_CARE_PROVIDER_SITE_OTHER): Payer: BC Managed Care – PPO

## 2021-12-27 ENCOUNTER — Other Ambulatory Visit: Payer: Self-pay

## 2021-12-27 DIAGNOSIS — R609 Edema, unspecified: Secondary | ICD-10-CM

## 2021-12-27 LAB — BAYER DCA HB A1C WAIVED: HB A1C (BAYER DCA - WAIVED): 5.3 % (ref 4.8–5.6)

## 2021-12-27 LAB — URINALYSIS, ROUTINE W REFLEX MICROSCOPIC
Bilirubin, UA: NEGATIVE
Glucose, UA: NEGATIVE
Ketones, UA: NEGATIVE
Leukocytes,UA: NEGATIVE
Nitrite, UA: NEGATIVE
RBC, UA: NEGATIVE
Specific Gravity, UA: >1.03 — ABNORMAL HIGH (ref 1.005–1.030)
Urobilinogen, Ur: 0.2 mg/dL (ref 0.2–1.0)
pH, UA: 5.5 (ref 5.0–7.5)

## 2021-12-28 LAB — CBC WITH DIFFERENTIAL/PLATELET
Basophils Absolute: 0.1 10*3/uL (ref 0.0–0.2)
Basos: 1 %
EOS (ABSOLUTE): 0.7 10*3/uL — ABNORMAL HIGH (ref 0.0–0.4)
Eos: 9 %
Hematocrit: 39.1 % (ref 34.0–46.6)
Hemoglobin: 12.8 g/dL (ref 11.1–15.9)
Immature Grans (Abs): 0 10*3/uL (ref 0.0–0.1)
Immature Granulocytes: 1 %
Lymphocytes Absolute: 2.2 10*3/uL (ref 0.7–3.1)
Lymphs: 30 %
MCH: 30.6 pg (ref 26.6–33.0)
MCHC: 32.7 g/dL (ref 31.5–35.7)
MCV: 94 fL (ref 79–97)
Monocytes Absolute: 0.5 10*3/uL (ref 0.1–0.9)
Monocytes: 6 %
Neutrophils Absolute: 4 10*3/uL (ref 1.4–7.0)
Neutrophils: 53 %
Platelets: 364 10*3/uL (ref 150–450)
RBC: 4.18 x10E6/uL (ref 3.77–5.28)
RDW: 13.7 % (ref 11.7–15.4)
WBC: 7.5 10*3/uL (ref 3.4–10.8)

## 2021-12-28 LAB — COMPREHENSIVE METABOLIC PANEL
ALT: 23 IU/L (ref 0–32)
AST: 20 IU/L (ref 0–40)
Albumin/Globulin Ratio: 1.3 (ref 1.2–2.2)
Albumin: 3.9 g/dL (ref 3.8–4.8)
Alkaline Phosphatase: 96 IU/L (ref 44–121)
BUN/Creatinine Ratio: 8 — ABNORMAL LOW (ref 9–23)
BUN: 8 mg/dL (ref 6–24)
Bilirubin Total: 0.2 mg/dL (ref 0.0–1.2)
CO2: 26 mmol/L (ref 20–29)
Calcium: 9.1 mg/dL (ref 8.7–10.2)
Chloride: 97 mmol/L (ref 96–106)
Creatinine, Ser: 1.02 mg/dL — ABNORMAL HIGH (ref 0.57–1.00)
Globulin, Total: 2.9 g/dL (ref 1.5–4.5)
Glucose: 88 mg/dL (ref 70–99)
Potassium: 4 mmol/L (ref 3.5–5.2)
Sodium: 125 mmol/L — ABNORMAL LOW (ref 134–144)
Total Protein: 6.8 g/dL (ref 6.0–8.5)
eGFR: 68 mL/min/{1.73_m2} (ref >59)

## 2021-12-28 LAB — LIPID PANEL
Chol/HDL Ratio: 5.4 ratio — ABNORMAL HIGH (ref 0.0–4.4)
Cholesterol, Total: 194 mg/dL (ref 100–199)
HDL: 36 mg/dL — ABNORMAL LOW (ref >39)
LDL Chol Calc (NIH): 123 mg/dL — ABNORMAL HIGH (ref 0–99)
Triglycerides: 197 mg/dL — ABNORMAL HIGH (ref 0–149)
VLDL Cholesterol Cal: 35 mg/dL (ref 5–40)

## 2021-12-28 LAB — TSH: TSH: 3.13 u[IU]/mL (ref 0.450–4.500)

## 2022-01-03 ENCOUNTER — Ambulatory Visit (HOSPITAL_COMMUNITY)
Admission: RE | Admit: 2022-01-03 | Discharge: 2022-01-03 | Disposition: A | Discharge status: Home / Self Care | Payer: BC Managed Care – PPO | Source: Ambulatory Visit | Attending: Cardiovascular Disease | Admitting: Cardiovascular Disease

## 2022-01-03 ENCOUNTER — Encounter: Payer: Self-pay | Admitting: Cardiovascular Disease

## 2022-01-03 ENCOUNTER — Encounter: Payer: Self-pay | Admitting: Internal Medicine

## 2022-01-03 ENCOUNTER — Other Ambulatory Visit: Payer: Self-pay | Admitting: Internal Medicine

## 2022-01-03 ENCOUNTER — Ambulatory Visit: Payer: BC Managed Care – PPO | Admitting: Internal Medicine

## 2022-01-03 ENCOUNTER — Other Ambulatory Visit: Payer: Self-pay

## 2022-01-03 ENCOUNTER — Other Ambulatory Visit: Payer: Self-pay | Admitting: Cardiology

## 2022-01-03 ENCOUNTER — Ambulatory Visit (INDEPENDENT_AMBULATORY_CARE_PROVIDER_SITE_OTHER): Payer: BC Managed Care – PPO | Admitting: Cardiovascular Disease

## 2022-01-03 VITALS — BP 108/74 | HR 74 | Temp 97.3°F | Wt 301.4 lb

## 2022-01-03 VITALS — BP 138/80 | HR 80

## 2022-01-03 DIAGNOSIS — Z9989 Dependence on other enabling machines and devices: Secondary | ICD-10-CM

## 2022-01-03 DIAGNOSIS — R609 Edema, unspecified: Secondary | ICD-10-CM | POA: Insufficient documentation

## 2022-01-03 DIAGNOSIS — E559 Vitamin D deficiency, unspecified: Secondary | ICD-10-CM

## 2022-01-03 DIAGNOSIS — Z5181 Encounter for therapeutic drug level monitoring: Secondary | ICD-10-CM | POA: Diagnosis not present

## 2022-01-03 DIAGNOSIS — R600 Localized edema: Secondary | ICD-10-CM | POA: Diagnosis not present

## 2022-01-03 DIAGNOSIS — G4733 Obstructive sleep apnea (adult) (pediatric): Secondary | ICD-10-CM

## 2022-01-03 DIAGNOSIS — Z7901 Long term (current) use of anticoagulants: Secondary | ICD-10-CM

## 2022-01-03 DIAGNOSIS — G43009 Migraine without aura, not intractable, without status migrainosus: Secondary | ICD-10-CM

## 2022-01-03 DIAGNOSIS — J4520 Mild intermittent asthma, uncomplicated: Secondary | ICD-10-CM

## 2022-01-03 DIAGNOSIS — M797 Fibromyalgia: Secondary | ICD-10-CM | POA: Diagnosis not present

## 2022-01-03 DIAGNOSIS — I82431 Acute embolism and thrombosis of right popliteal vein: Secondary | ICD-10-CM

## 2022-01-03 DIAGNOSIS — I82409 Acute embolism and thrombosis of unspecified deep veins of unspecified lower extremity: Secondary | ICD-10-CM | POA: Insufficient documentation

## 2022-01-03 DIAGNOSIS — E871 Hypo-osmolality and hyponatremia: Secondary | ICD-10-CM

## 2022-01-03 DIAGNOSIS — Z6841 Body Mass Index (BMI) 40.0 and over, adult: Secondary | ICD-10-CM

## 2022-01-03 LAB — URINALYSIS, ROUTINE W REFLEX MICROSCOPIC
Bilirubin, UA: NEGATIVE
Glucose, UA: NEGATIVE
Ketones, UA: NEGATIVE
Leukocytes,UA: NEGATIVE
Nitrite, UA: NEGATIVE
Protein,UA: NEGATIVE
RBC, UA: NEGATIVE
Specific Gravity, UA: 1.01 (ref 1.005–1.030)
Urobilinogen, Ur: 0.2 mg/dL (ref 0.2–1.0)
pH, UA: 6 (ref 5.0–7.5)

## 2022-01-03 MED ORDER — ATORVASTATIN CALCIUM 40 MG PO TABS: 40.0000 mg | ORAL_TABLET | Freq: Every day | ORAL | 3 refills | Status: DC

## 2022-01-03 MED ORDER — RIVAROXABAN (XARELTO) VTE STARTER PACK (15 & 20 MG): ORAL_TABLET | ORAL | 0 refills | Status: DC

## 2022-01-03 MED ORDER — RIVAROXABAN 20 MG PO TABS: 20.0000 mg | ORAL_TABLET | Freq: Every day | ORAL | 6 refills | Status: DC

## 2022-01-03 MED ORDER — FUROSEMIDE 20 MG PO TABS: 20.0000 mg | ORAL_TABLET | ORAL | 6 refills | Status: DC | PRN

## 2022-01-03 MED ORDER — VITAMIN D (ERGOCALCIFEROL) 1.25 MG (50000 UNIT) PO CAPS: 50000.0000 [IU] | ORAL_CAPSULE | ORAL | 0 refills | Status: DC

## 2022-01-03 MED ORDER — BUTALBITAL-APAP-CAFFEINE 50-325-40 MG PO TABS: 1.0000 | ORAL_TABLET | Freq: Four times a day (QID) | ORAL | 0 refills | Status: DC | PRN

## 2022-01-03 NOTE — Progress Notes (Signed)
Cardiology Office Note    Date:  01/03/2022   ID:  Pam, Nguyen 1976/03/21, MRN [id number]  PCP:  Pam Cousins, MD  Cardiologist: Dr. Glenetta Nguyen  DOD add-on for acute DVT   History of Present Illness:  [name] is a 46 y.o. female who is followed by Dr. Neomia Nguyen for primary care and recently saw Dr. Ellyn Nguyen on November 23, 2021 for preoperative clearance prior to undergoing back surgery.  [name] has a history of morbid obesity, obstructive sleep apnea on CPAP therapy, fibromyalgia, GERD, intermittent asthma, mixed hyperlipidemia, and recently had begun to notice some leg swelling.  She underwent back surgery approximately 3 weeks ago for ruptured disc by Dr. Newman Nguyen.  Subsequently, she has continued to notice progressive lower extremity edema.  She had been previously been given scription for HCTZ and when she saw Dr. Ellyn Nguyen he also gave her a prescription for furosemide to take as needed for additional swelling.  Dr. Ellyn Nguyen scheduled her to undergo a lower extremity venous insufficiency study which was done in our office today.  At approximately 5:30 PM the patient was undergoing her Doppler evaluation and was found to have an acute nonocclusive thrombus involving the right popliteal artery.  She did not have any DVT on the left.  As result, as DOD I was contacted and I am now seeing the patient in the office at the end of the day for initial evaluation.  She presently denies any chest pain.  She denies pleuritic symptoms.  She has history of some exertional dyspnea which is unchanged.   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   Generalized headaches    GERD (gastroesophageal reflux disease)    H/O laparoscopic adjustable gastric banding 07/29/2013   10/17/10    History of removal of laparoscopic gastric banding device 02/03/2018   Irregular menses    Joint pain    Lactose intolerance    Lower extremity edema    Migraines    Muscle pain    Obesity (BMI 30-39.9)    Osteoarthritis    Other allergic rhinitis 05/24/2020   Ovarian retention cyst 11/13/2012   Sleep apnea    SOB (shortness of breath)    Stomach ulcer    Swallowing difficulty    Vision problems    Vitamin D deficiency     Past Surgical History:  Procedure Laterality Date   ABDOMINAL HYSTERECTOMY     ACHILLES TENDON LENGTHENING  1989   ANKLE GANGLION CYST EXCISION  02/2012   left ankle   ANTERIOR LAT LUMBAR FUSION Right 05/11/2019   Procedure: ANTERIOR LATERAL LUMBAR INTERBODY FUSION, LATERAL INSTRUMENTATION, RIGHT LUMBAR TWO- LUMBAR THREE;  Surgeon: Pam Pies, MD;  Location: [facility];  Service: Neurosurgery;  Laterality: Right;  ANTERIOR LATERAL LUMBAR INTERBODY FUSION, LATERAL INSTRUMENTATION, RIGHT LUMBAR TWO- LUMBAR THREE   BIOPSY  03/16/2019   Procedure: BIOPSY;  Surgeon: Pam Dolin, MD;  [facility];  Service: Endoscopy;;  Gastric    COLONOSCOPY WITH PROPOFOL N/A 03/16/2019   Dr. Gala Nguyen: Normal terminal ileum, 15 cm.  Normal colon.  Next colonoscopy 10 years   DILATION AND CURETTAGE OF UTERUS  2003   ENTEROSCOPY N/A 04/02/2019   Dr. Gala Nguyen: bulbar erosions, jejunal ulcerations, attempted biopsy but scope fell backwards and lesions cannot be reidentified.  No evidence of ampullary lesion or mass.    ESOPHAGOGASTRODUODENOSCOPY (EGD) WITH PROPOFOL N/A 03/16/2019   Dr. Gala Nguyen: Large hiatal hernia, Cameron/antral erosions, duodenal erosions.  Gastric biopsy showed reactive gastropathy, mild chronic gastritis, negative for H. pylori   FEET SURGERY  1990   INSERTION OF BONE GRAFT IN FEET   GIVENS CAPSULE STUDY N/A 03/16/2019   Procedure: GIVENS CAPSULE STUDY;  Surgeon: Pam Dolin, MD;  Location: [facility];  Service: Endoscopy;  Laterality: N/A;   LAPAROSCOPIC GASTRIC BANDING  10/17/10   Dr [name]; [facility]   LAPAROSCOPIC REPAIR AND REMOVAL OF GASTRIC BAND  01/2018   LIPOMA EXCISION  02/2006   abdomen   SPINE SURGERY N/A    [employer]rees[employer] 11/10/2020    Current Medications: Outpatient Medications Prior to Visit  Medication Sig Dispense Refill   albuterol (VENTOLIN HFA) 108 (90 Base) MCG/ACT inhaler Inhale 2 puffs into the lungs every 6 (six) hours as needed for wheezing.     atorvastatin (LIPITOR) 40 MG tablet Take 1 tablet (40 mg total) by mouth daily. 90 tablet 3   busPIRone (BUSPAR) 10 MG tablet Take 1 tablet (10 mg total) by mouth 3 (three) times daily. 270 tablet 3   butalbital-acetaminophen-caffeine (FIORICET) 50-325-40 MG tablet Take 1 tablet by mouth every 6 (six) hours as needed for headache. 30 tablet 0   DULoxetine (CYMBALTA) 60 MG capsule Take 1 capsule (60 mg total) by mouth 2 (two) times daily. 90 capsule 3   EPINEPHrine 0.3 mg/0.3 mL IJ SOAJ injection Inject 0.3 mg into the muscle as needed for anaphylaxis.     gabapentin (NEURONTIN) 300 MG capsule Take 1 capsule (300 mg total) by mouth 3 (three) times daily. 270 capsule 3   hydrochlorothiazide (HYDRODIURIL) 25 MG tablet Take 1 tablet (25 mg total) by mouth daily. 90 tablet 3   loratadine (CLARITIN) 10 MG tablet Take 10 mg by mouth daily.     Probiotic Product (PROBIOTIC BLEND PO) daily.     propranolol (INDERAL) 20 MG tablet Take 1 tablet (20 mg total) by mouth 3 (three) times daily as needed (anxiety). 90 tablet 3    tirzepatide (MOUNJARO) 7.5 MG/0.5ML Pen Inject 7.5 mg into the skin once a week. 2 mL 2   traZODone (DESYREL) 100 MG tablet Take 1 tablet (100 mg total) by mouth at bedtime. 90 tablet 3   [START ON 01/04/2022] Vitamin D, Ergocalciferol, (DRISDOL) 1.25 MG (50000 UNIT) CAPS capsule Take 1 capsule (50,000 Units total) by mouth 2 (two) times a week. 24 capsule 0   furosemide (LASIX) 20 MG tablet Take 1 tablet (20 mg total) by mouth as needed for edema. 16 tablet 0   No facility-administered medications prior to visit.     Allergies:   Cashew nut oil, Cauliflower [brassica oleracea], Whey, Augmentin [amoxicillin-pot clavulanate], Beeswax, Honey, Orange fruit [citrus], Wheat bran, Bee pollen, Gluten meal, Milk-related compounds,  Other, Codeine, and Tree extract   Social History   Socioeconomic History   Marital status: Married    Spouse name: Saralyn Pilar   Number of children: 3   Years of education: Not on file   Highest education level: Not on file  Occupational History   Occupation: school counselor  Tobacco Use   Smoking status: Never   Smokeless tobacco: Never  Vaping Use   Vaping Use: Never used  Substance and Sexual Activity   Alcohol use: No   Drug use: No   Sexual activity: Yes  Other Topics Concern   Not on file  Social History Narrative   Lives with husband married 23 years Dec 2021      Oldest is 63- son lives in [place] daughter and 54 son year olds in home      Cat: Snickers       Enjoy: cooking some, puzzles, reading       Diet: eats all food groups outside allergies    Caffeine: 2 cans of diet soda daily and some tea   Water: 3 24 oz containers daily       Wears seat belt    Does not use phone while driving    Engineer, manufacturing systems -none          Social Determinants of Health   Financial Resource Strain: Not on file  Food Insecurity: Not on file  Transportation Needs: Not on file  Physical Activity: Not on file  Stress: Not  on file  Social Connections: Not on file     Family History:  The patient's family history includes Allergic rhinitis in her father, mother, and sister; Asthma in her father, mother, and sister; Cancer in her father, maternal grandfather, and paternal grandfather; Colon cancer in her maternal grandfather; Colon polyps in an other family member; Diabetes in her father, mother, and sister; Heart disease in her father and maternal grandmother; Hyperlipidemia in her father and mother; Hypertension in her father, mother, and sister; Migraines in her son; Pleurisy in her son; Urticaria in her father, mother, and sister.   ROS General: Negative; No fevers, chills, or night sweats; morbid obesity, remote gastric banding, recently treated with Mounjaro HEENT: Negative; No changes in vision or hearing, sinus congestion, difficulty swallowing Pulmonary: History of exercise-induced asthma.  No acute shortness of breath Cardiovascular: Negative; No chest pain, presyncope, syncope, palpitations Bilateral lower extremity edema GI: Negative; No nausea, vomiting, diarrhea, or abdominal pain GU: Negative; No dysuria, hematuria, or difficulty voiding Musculoskeletal: Status post neck surgery by Dr. Arnoldo Morale for ruptured disc Hematologic/Oncology: Negative; no easy bruising, bleeding Endocrine:  Neuro: Negative; no changes in balance, headaches Skin: Negative; No rashes or skin lesions Psychiatric: Negative; No behavioral problems, depression Sleep: Positive for OSA on CPAP Other comprehensive 14 point system review is negative.   PHYSICAL EXAM:   VS:  BP 138/80 (BP Location: Left Arm, Patient Position: Sitting, Cuff Size: Large)    Pulse 80    LMP 11/07/2012     Blood pressure by me was 138/80, pulse 86, O2 saturation 97%   Wt Readings from Last 3 Encounters:  01/03/22 (!) 301 lb 6.4 oz (136.7 kg)  11/28/21 289 lb (131.1 kg)  11/23/21 297 lb (134.7 kg)    General: Alert, oriented, no distress.  Super  morbid obesity with BMI 50.2 Skin: normal turgor, no rashes, warm and dry HEENT: Normocephalic, atraumatic. Pupils equal round and reactive  to light; sclera anicteric; extraocular muscles intact;  Nose without nasal septal hypertrophy Mouth/Parynx benign; Mallinpatti scale Neck: Thick neck; no JVD, no carotid bruits; normal carotid upstroke Lungs: clear to ausculatation and percussion; no wheezing or rales Chest wall: without tenderness to palpitation Heart: PMI not displaced, RRR, s1 s2 normal, 1/6 systolic murmur, no diastolic murmur, no rubs, gallops, thrills, or heaves Abdomen: Significant central adiposity;soft, nontender; no hepatosplenomehaly, BS+; abdominal aorta nontender and not dilated by palpation. Back: no CVA tenderness Pulses 2+ Musculoskeletal: full range of motion, normal strength, no joint deformities Extremities: 1+ lower extremity edema below the knees right greater than left.  Tenderness to the right calf with mild positive Bevelyn Buckles' sign on the right negative on the left Neurologic: grossly nonfocal; Cranial nerves grossly wnl Psychologic: Normal mood and affect   Studies/Labs Reviewed:   EKG:  EKG is not ordered today.    Recent Labs: BMP Latest Ref Rng & Units 12/27/2021 07/26/2021 02/22/2021  Glucose 70 - 99 mg/dL 88 98 80  BUN 6 - 24 mg/dL 8 10 5(L)  Creatinine 0.57 - 1.00 mg/dL 1.02(H) 1.00 0.84  BUN/Creat Ratio 9 - 23 8(L) 10 6(L)  Sodium 134 - 144 mmol/L 125(L) 139 139  Potassium 3.5 - 5.2 mmol/L 4.0 3.6 4.5  Chloride 96 - 106 mmol/L 97 97 98  CO2 20 - 29 mmol/L '26 24 24  ' Calcium 8.7 - 10.2 mg/dL 9.1 9.2 9.0     Hepatic Function Latest Ref Rng & Units 12/27/2021 07/26/2021 02/22/2021  Total Protein 6.0 - 8.5 g/dL 6.8 7.4 6.9  Albumin 3.8 - 4.8 g/dL 3.9 4.6 4.2  AST 0 - 40 IU/L '20 24 19  ' ALT 0 - 32 IU/L '23 28 24  ' Alk Phosphatase 44 - 121 IU/L 96 109 114  Total Bilirubin 0.0 - 1.2 mg/dL 0.2 0.4 0.3  Bilirubin, Direct 0.0 - 0.3 mg/dL - - -    CBC Latest  Ref Rng & Units 12/27/2021 07/26/2021 02/22/2021  WBC 3.4 - 10.8 x10E3/uL 7.5 7.9 8.4  Hemoglobin 11.1 - 15.9 g/dL 12.8 14.6 13.5  Hematocrit 34.0 - 46.6 % 39.1 44.4 40.2  Platelets 150 - 450 x10E3/uL 364 318 280   Lab Results  Component Value Date   MCV 94 12/27/2021   MCV 94 07/26/2021   MCV 90 02/22/2021   Lab Results  Component Value Date   TSH 3.130 12/27/2021   Lab Results  Component Value Date   HGBA1C 5.3 12/27/2021     BNP No results found for: BNP  ProBNP No results found for: PROBNP   Lipid Panel     Component Value Date/Time   CHOL 194 12/27/2021 0831   TRIG 197 (H) 12/27/2021 0831   HDL 36 (L) 12/27/2021 0831   CHOLHDL 5.4 (H) 12/27/2021 0831   CHOLHDL 5.3 CALC 12/20/2008 0858   VLDL 22 12/20/2008 0858   LDLCALC 123 (H) 12/27/2021 0831   LABVLDL 35 12/27/2021 0831     RADIOLOGY: VAS [place] LOWER EXTREMITY VENOUS (DVT)  Result Date: 01/03/2022  Lower Venous DVT Study Patient Name:  ASHIAH KARPOWICZ  Date of Exam:   01/03/2022 Medical Rec #: [id number]         Accession #:    [id number] Date of Birth: [birth year]         Patient Gender: F Patient Age:   78 years Exam Location:  [facility] Procedure:      VAS [place] LOWER EXTREMITY VENOUS (DVT) Referring Phys: DAVID HARDING --------------------------------------------------------------------------------  Indications: Patient presents today with complaints of bilateral lower extremity swelling and edema, right worse than left x 6 months. Recent back surgery about 3 weeks ago. Since surgery she has noted more swelling in the lower legs like a "tree trunk" involving the right leg. She denies any abnormal SOB.  Risk Factors: Recent immobility post back surgery. Surgery to the back on December 13, 2021. obesity. Comparison Study: NA Performing Technologist: Sharlett Iles RVT  Examination Guidelines: A complete evaluation includes B-mode imaging, spectral Doppler, color Doppler, and power Doppler as needed of all accessible portions  of each vessel. Bilateral testing is considered an integral part of a complete examination. Limited examinations for reoccurring indications may be performed as noted. The reflux portion of the exam is performed with the patient in reverse Trendelenburg.  +---------+---------------+---------+-----------+---------------+--------------+  RIGHT     Compressibility Phasicity Spontaneity Properties      Thrombus Aging  +---------+---------------+---------+-----------+---------------+--------------+  CFV       Full            Yes       Yes                                         +---------+---------------+---------+-----------+---------------+--------------+  SFJ       Full            Yes       Yes                                         +---------+---------------+---------+-----------+---------------+--------------+  FV Prox   Full            Yes       Yes                                         +---------+---------------+---------+-----------+---------------+--------------+  FV Mid    Full                                                                  +---------+---------------+---------+-----------+---------------+--------------+  FV Distal Full            Yes       Yes                                         +---------+---------------+---------+-----------+---------------+--------------+  PFV       Full            Yes       Yes                                         +---------+---------------+---------+-----------+---------------+--------------+  POP       Full            Yes       Yes  softly          Acute                                                            echogenic                       +---------+---------------+---------+-----------+---------------+--------------+  PTV       Full                                                                  +---------+---------------+---------+-----------+---------------+--------------+  PERO      not visualized                                                         +---------+---------------+---------+-----------+---------------+--------------+  Gastroc   Full                                                                  +---------+---------------+---------+-----------+---------------+--------------+  GSV       Full            Yes       Yes                                         +---------+---------------+---------+-----------+---------------+--------------+   +---------+---------------+---------+-----------+----------+--------------+  LEFT      Compressibility Phasicity Spontaneity Properties Thrombus Aging  +---------+---------------+---------+-----------+----------+--------------+  CFV       Full            Yes       Yes                                    +---------+---------------+---------+-----------+----------+--------------+  SFJ       Full            Yes       Yes                                    +---------+---------------+---------+-----------+----------+--------------+  FV Prox   Full            Yes       Yes                                    +---------+---------------+---------+-----------+----------+--------------+  FV Mid    Full                                                             +---------+---------------+---------+-----------+----------+--------------+  FV Distal Full            Yes       Yes                                    +---------+---------------+---------+-----------+----------+--------------+  PFV       Full            Yes       Yes                                    +---------+---------------+---------+-----------+----------+--------------+  POP       Full            Yes       Yes                                    +---------+---------------+---------+-----------+----------+--------------+  PTV       Full                                                             +---------+---------------+---------+-----------+----------+--------------+  PERO      not visualized                                                    +---------+---------------+---------+-----------+----------+--------------+  Gastroc   Full                                                             +---------+---------------+---------+-----------+----------+--------------+  GSV       Full            Yes       Yes                                    +---------+---------------+---------+-----------+----------+--------------+    Findings reported to Dr. Claiborne Billings at 5:40 pm.  Summary: RIGHT: - Findings consistent with acute deep vein thrombosis involving the right popliteal vein. - No cystic structure found in the popliteal fossa. - All other veins visualized appear fully compressible and demonstrate appropriate Doppler characteristics.  LEFT: - No evidence of common femoral vein obstruction. - No cystic structure found in the popliteal fossa.  *See table(s) above for measurements and observations. Electronically signed by Berniece Salines DO on 01/03/2022 at 6:10:06 PM.    Final      Additional studies/ records that were reviewed today include:   Records of Dr. Ellyn Nguyen were reviewed.  Lower extremity DVT study reviewed as noted above   ASSESSMENT:    1. Acute deep vein thrombosis (DVT) of popliteal vein of right lower extremity (HCC)   2. Bilateral lower extremity edema   3. Alteration in  anticoagulation   4. Fibromyalgia   5. Morbid obesity with BMI of 50.0-59.9, adult ([place])   6. OSA on CPAP   7. Mild intermittent asthma without complication     PLAN:  Ms. Penny Arrambide is a 46 year old female who has a history of morbid obesity, obstructive sleep apnea, OSA on CPAP, mild intermittent asthma, mixed hyperlipidemia, for several weeks had noticed progressive lower extremity edema.  She was seen by Dr. Ellyn Nguyen for preoperative clearance on November 23, 2021 and subsequently underwent successful back surgery for ruptured disc by Dr. Newman Nguyen approximately 3 weeks ago.  Subsequently, she has not been very active.  She has noticed lower extremity edema  progressing in has not been responsive to HCTZ.  She did take furosemide recently.  She is found today on lower extremity Doppler evaluation to have an acute right popliteal DVT.  As result she was seen by me.  On exam she does have posterior right calf tenderness and positive Homans' sign.  She is breathing comfortably she is not short of breath she denies chest pain or pleuritic symptoms.  I discussed with her treatment options for her acute DVT and have called in a prescription for Xarelto 15 mg twice daily for 21 days followed by 20 mg daily for acute DVT treatment.  She has a follow-up appointment later next week already to see Dr. Ellyn Nguyen and I have recommended she continue this.  I recommended that she not take nonsteroidal anti-inflammatory medication which she previously had been on due to potential bleed risk with her initiation of anticoagulation.  She was instructed that if she acutely became short of breath that she should seek additional medical attention due to risk for potential PE.   Medication Adjustments/Labs and Tests Ordered: Current medicines are reviewed at length with the patient today.  Concerns regarding medicines are outlined above.  Medication changes, Labs and Tests ordered today are listed in the Patient Instructions below. Patient Instructions  Medication Instructions:  START XARELTO-STARTER PACK  *If you need a refill on your cardiac medications before your next appointment, please call your pharmacy*  Lab Work:   Testing/Procedures:  NONE    NONE  Follow-Up: Your next appointment:  FOLLOW UP WITH   In Person with Pam Hew, MD AS SCHEDULED   At [facility], you and your health needs are our priority.  As part of our continuing mission to provide you with exceptional heart care, we have created designated Provider Care Teams.  These Care Teams include your primary Cardiologist (physician) and Advanced Practice Providers (APPs -  Physician Assistants and Nurse  Practitioners) who all work together to provide you with the care you need, when you need it.     Signed, Shelva Majestic, MD  01/03/2022 6:29 PM    [facility] [address], [place], [place], [place]  [address] Phone: [phone]

## 2022-01-03 NOTE — Patient Instructions (Signed)
Medication Instructions:  ?START [name] PACK ? ?*If you need a refill on your cardiac medications before your next appointment, please call your pharmacy* ? ?Lab Work:   Testing/Procedures:  ?NONE    NONE ? ?Follow-Up: ?Your next appointment:  FOLLOW UP WITH   In Person with Bryan Lemma, MD AS SCHEDULED ?  ?At [facility], you and your health needs are our priority.  As part of our continuing mission to provide you with exceptional heart care, we have created designated Provider Care Teams.  These Care Teams include your primary Cardiologist (physician) and Advanced Practice Providers (APPs -  Physician Assistants and Nurse Practitioners) who all work together to provide you with the care you need, when you need it. ? ? ?

## 2022-01-03 NOTE — Progress Notes (Signed)
BP 108/74    Pulse 74    Temp (!) 97.3 F (36.3 C)    Wt (!) 301 lb 6.4 oz (136.7 kg)    LMP 11/07/2012    SpO2 97%    BMI 50.16 kg/m    Subjective:    Patient ID: [name], female    DOB: November 16, 1975, 46 y.o.   MRN: [id number]  Chief Complaint  Patient presents with   Hypertension    Follow up     HPI: AYLISSA Nguyen is a 46 y.o. female  Recently had back surg x 2 weeks ago   Hypertension This is a chronic problem. The current episode started more than 1 month ago. The problem is controlled. Pertinent negatives include no anxiety, blurred vision, chest pain, headaches, malaise/fatigue, neck pain, orthopnea, palpitations, peripheral edema, PND, shortness of breath or sweats.  Hyperlipidemia This is a chronic problem. The current episode started more than 1 month ago. The problem is uncontrolled. Pertinent negatives include no chest pain or shortness of breath.   Chief Complaint  Patient presents with   Hypertension    Follow up     Relevant past medical, surgical, family and social history reviewed and updated as indicated. Interim medical history since our last visit reviewed. Allergies and medications reviewed and updated.  Review of Systems  Constitutional:  Negative for malaise/fatigue.  Eyes:  Negative for blurred vision.  Respiratory:  Negative for shortness of breath.   Cardiovascular:  Negative for chest pain, palpitations, orthopnea and PND.  Musculoskeletal:  Negative for neck pain.  Neurological:  Negative for headaches.   Per HPI unless specifically indicated above     Objective:    BP 108/74    Pulse 74    Temp (!) 97.3 F (36.3 C)    Wt (!) 301 lb 6.4 oz (136.7 kg)    LMP 11/07/2012    SpO2 97%    BMI 50.16 kg/m   Wt Readings from Last 3 Encounters:  01/03/22 (!) 301 lb 6.4 oz (136.7 kg)  11/28/21 289 lb (131.1 kg)  11/23/21 297 lb (134.7 kg)    Physical Exam Vitals and nursing note reviewed.  Constitutional:      General: She is not  in acute distress.    Appearance: Normal appearance. She is not ill-appearing or diaphoretic.  Eyes:     Conjunctiva/sclera: Conjunctivae normal.  Pulmonary:     Breath sounds: No rhonchi.  Abdominal:     General: Abdomen is flat. Bowel sounds are normal. There is no distension.     Palpations: Abdomen is soft. There is no mass.     Tenderness: There is no abdominal tenderness. There is no guarding.  Skin:    General: Skin is warm and dry.     Coloration: Skin is not jaundiced.     Findings: No erythema.  Neurological:     Mental Status: She is alert.    Results for orders placed or performed in visit on 12/27/21  Bayer DCA Hb A1c Waived (STAT)  Result Value Ref Range   HB A1C (BAYER DCA - WAIVED) 5.3 4.8 - 5.6 %  TSH  Result Value Ref Range   TSH 3.130 0.450 - 4.500 uIU/mL  Urinalysis, Routine w reflex microscopic  Result Value Ref Range   Specific Gravity, UA >1.030 (H) 1.005 - 1.030   pH, UA 5.5 5.0 - 7.5   Color, UA Yellow Yellow   Appearance Ur Cloudy (A) Clear   Leukocytes,UA  Negative Negative   Protein,UA Trace (A) Negative/Trace   Glucose, UA Negative Negative   Ketones, UA Negative Negative   RBC, UA Negative Negative   Bilirubin, UA Negative Negative   Urobilinogen, Ur 0.2 0.2 - 1.0 mg/dL   Nitrite, UA Negative Negative  Lipid panel  Result Value Ref Range   Cholesterol, Total 194 100 - 199 mg/dL   Triglycerides 197 (H) 0 - 149 mg/dL   HDL 36 (L) >39 mg/dL   VLDL Cholesterol Cal 35 5 - 40 mg/dL   LDL Chol Calc (NIH) 123 (H) 0 - 99 mg/dL   Chol/HDL Ratio 5.4 (H) 0.0 - 4.4 ratio  Comprehensive metabolic panel  Result Value Ref Range   Glucose 88 70 - 99 mg/dL   BUN 8 6 - 24 mg/dL   Creatinine, Ser 1.02 (H) 0.57 - 1.00 mg/dL   eGFR 68 >59 mL/min/1.73   BUN/Creatinine Ratio 8 (L) 9 - 23   Sodium 125 (L) 134 - 144 mmol/L   Potassium 4.0 3.5 - 5.2 mmol/L   Chloride 97 96 - 106 mmol/L   CO2 26 20 - 29 mmol/L   Calcium 9.1 8.7 - 10.2 mg/dL   Total Protein  6.8 6.0 - 8.5 g/dL   Albumin 3.9 3.8 - 4.8 g/dL   Globulin, Total 2.9 1.5 - 4.5 g/dL   Albumin/Globulin Ratio 1.3 1.2 - 2.2   Bilirubin Total 0.2 0.0 - 1.2 mg/dL   Alkaline Phosphatase 96 44 - 121 IU/L   AST 20 0 - 40 IU/L   ALT 23 0 - 32 IU/L  CBC with Differential/Platelet  Result Value Ref Range   WBC 7.5 3.4 - 10.8 x10E3/uL   RBC 4.18 3.77 - 5.28 x10E6/uL   Hemoglobin 12.8 11.1 - 15.9 g/dL   Hematocrit 39.1 34.0 - 46.6 %   MCV 94 79 - 97 fL   MCH 30.6 26.6 - 33.0 pg   MCHC 32.7 31.5 - 35.7 g/dL   RDW 13.7 11.7 - 15.4 %   Platelets 364 150 - 450 x10E3/uL   Neutrophils 53 Not Estab. %   Lymphs 30 Not Estab. %   Monocytes 6 Not Estab. %   Eos 9 Not Estab. %   Basos 1 Not Estab. %   Neutrophils Absolute 4.0 1.4 - 7.0 x10E3/uL   Lymphocytes Absolute 2.2 0.7 - 3.1 x10E3/uL   Monocytes Absolute 0.5 0.1 - 0.9 x10E3/uL   EOS (ABSOLUTE) 0.7 (H) 0.0 - 0.4 x10E3/uL   Basophils Absolute 0.1 0.0 - 0.2 x10E3/uL   Immature Granulocytes 1 Not Estab. %   Immature Grans (Abs) 0.0 0.0 - 0.1 x10E3/uL        Current Outpatient Medications:    albuterol (VENTOLIN HFA) 108 (90 Base) MCG/ACT inhaler, Inhale 2 puffs into the lungs every 6 (six) hours as needed for wheezing., Disp: , Rfl:    busPIRone (BUSPAR) 10 MG tablet, Take 1 tablet (10 mg total) by mouth 3 (three) times daily., Disp: 270 tablet, Rfl: 3   butalbital-acetaminophen-caffeine (FIORICET) 50-325-40 MG tablet, Take 1 tablet by mouth every 6 (six) hours as needed for headache., Disp: 30 tablet, Rfl: 0   DULoxetine (CYMBALTA) 60 MG capsule, Take 1 capsule (60 mg total) by mouth 2 (two) times daily., Disp: 90 capsule, Rfl: 3   EPINEPHrine 0.3 mg/0.3 mL IJ SOAJ injection, Inject 0.3 mg into the muscle as needed for anaphylaxis., Disp: , Rfl:    furosemide (LASIX) 20 MG tablet, Take 1 tablet (20 mg total)  by mouth as needed for edema., Disp: 16 tablet, Rfl: 0   gabapentin (NEURONTIN) 300 MG capsule, Take 1 capsule (300 mg total) by mouth  3 (three) times daily., Disp: 270 capsule, Rfl: 3   hydrochlorothiazide (HYDRODIURIL) 25 MG tablet, Take 1 tablet (25 mg total) by mouth daily., Disp: 90 tablet, Rfl: 3   loratadine (CLARITIN) 10 MG tablet, Take 10 mg by mouth daily., Disp: , Rfl:    Probiotic Product (PROBIOTIC BLEND PO), daily., Disp: , Rfl:    propranolol (INDERAL) 20 MG tablet, Take 1 tablet (20 mg total) by mouth 3 (three) times daily as needed (anxiety)., Disp: 90 tablet, Rfl: 3   tirzepatide (MOUNJARO) 7.5 MG/0.5ML Pen, Inject 7.5 mg into the skin once a week., Disp: 2 mL, Rfl: 2   traZODone (DESYREL) 100 MG tablet, Take 1 tablet (100 mg total) by mouth at bedtime., Disp: 90 tablet, Rfl: 3   Vitamin D, Ergocalciferol, (DRISDOL) 1.25 MG (50000 UNIT) CAPS capsule, Take 1 capsule (50,000 Units total) by mouth 2 (two) times a week., Disp: 24 capsule, Rfl: 0    Assessment & Plan:  Migraies : is on fiorcicet for such  Takes one and helps within 15 - 20 mins.   Obesity is on mounjaro - is trying to take it per obesity clinic.  Was supposed to e on 7.5 mg is on 5 mg q weekly now hasnt been walking sec to back surg  Sleep apnea Is on cpap per sleep med uses this everyday    HLD TG 188 last checked in sept , LDL at 143. -- down now to 120 Will start pt on lipitor. recheck FLP, check LFT's work on diet, SE of meds explained to pt. low fat and high fiber diet explained to pt.     HTN : is on hctz for such and for lower ext edema on lasix prn to have vas sug evauate for such next week Continue current meds.  Medication compliance emphasised. pt advised to keep Bp logs. Pt verbalised understanding of the same. Pt to have a low salt diet . Exercise to reach a goal of at least 150 mins a week.  lifestyle modifications explained and pt understands importance of the above. Under good control on current regimen. Continue current regimen. Continue to monitor. Call with any concerns. Refills given. Labs drawn today.   Back pain :   Sp surg  ruptured ?  disc L3 - L4    Anxiety is on buspar and cymbalta - has a ho fibromyalgia. Stable chronic.   Hyponatremia with Na of 125 will recheck  Pt just had surg  x 2weeks prior to such  Problem List Items Addressed This Visit       Cardiovascular and Mediastinum   Migraine without aura and without status migrainosus, not intractable     Other   Vitamin D deficiency     No orders of the defined types were placed in this encounter.    No orders of the defined types were placed in this encounter.    Follow up plan: No follow-ups on file.

## 2022-01-04 LAB — COMPREHENSIVE METABOLIC PANEL
ALT: 23 IU/L (ref 0–32)
AST: 22 IU/L (ref 0–40)
Albumin/Globulin Ratio: 1.6 (ref 1.2–2.2)
Albumin: 4.3 g/dL (ref 3.8–4.8)
Alkaline Phosphatase: 98 IU/L (ref 44–121)
BUN/Creatinine Ratio: 8 — ABNORMAL LOW (ref 9–23)
BUN: 12 mg/dL (ref 6–24)
Bilirubin Total: <0.2 mg/dL (ref 0.0–1.2)
CO2: 22 mmol/L (ref 20–29)
Calcium: 9 mg/dL (ref 8.7–10.2)
Chloride: 104 mmol/L (ref 96–106)
Creatinine, Ser: 1.47 mg/dL — ABNORMAL HIGH (ref 0.57–1.00)
Globulin, Total: 2.7 g/dL (ref 1.5–4.5)
Glucose: 96 mg/dL (ref 70–99)
Potassium: 4.7 mmol/L (ref 3.5–5.2)
Sodium: 141 mmol/L (ref 134–144)
Total Protein: 7 g/dL (ref 6.0–8.5)
eGFR: 44 mL/min/{1.73_m2} — ABNORMAL LOW (ref >59)

## 2022-01-04 NOTE — Telephone Encounter (Signed)
Requested medication (s) are due for refill today:   Yes  prescribed yesterday by [name] ? ?Requested medication (s) are on the active medication list:   Yes ? ?Future visit scheduled:   Yes ? ? ?Last ordered: 01/04/2022 #24, 0 refills ? ?Returned because it's a non delegated refill.    Also a 90 day supply is being requested.  ? ?Requested Prescriptions  ?Pending Prescriptions Disp Refills  ? Vitamin D, Ergocalciferol, (DRISDOL) 1.25 MG (50000 UNIT) CAPS capsule [Pharmacy Med Name: VITAMIN D2 50,000IU (ERGO) CAP RX] 25 capsule   ?  Sig: TAKE 1 CAPSULE BY MOUTH 2 TIMES A WEEK  ?  ? Endocrinology:  Vitamins - Vitamin D Supplementation 2 Failed - 01/03/2022  2:43 PM  ?  ?  Failed - Manual Review: Route requests for 50,000 IU strength to the provider  ?  ?  Passed - Ca in normal range and within 360 days  ?  Calcium  ?Date Value Ref Range Status  ?01/03/2022 9.0 8.7 - 10.2 mg/dL Final  ?  ?  ?  ?  Passed - Vitamin D in normal range and within 360 days  ?  Vit D, 25-Hydroxy  ?Date Value Ref Range Status  ?07/26/2021 42.7 30.0 - 100.0 ng/mL Final  ?  Comment:  ?  Vitamin D deficiency has been defined by the Institute of ?Medicine and an Endocrine Society practice guideline as a ?level of serum 25-OH vitamin D less than 20 ng/mL (1,2). ?The Endocrine Society went on to further define vitamin D ?insufficiency as a level between 21 and 29 ng/mL (2). ?1. IOM (Institute of Medicine). 2010. Dietary reference ?   intakes for calcium and D. [place]: The ?   [employer]. ?2. Holick MF, Binkley Olmito and Olmito, Bischoff-Ferrari HA, et al. ?   Evaluation, treatment, and prevention of vitamin D ?   deficiency: an Endocrine Society clinical practice ?   guideline. JCEM. 2011 Jul; 96(7):1911-30. ?  ?  ?  ?  ?  Passed - Valid encounter within last 12 months  ?  Recent Outpatient Visits   ? ?      ? Yesterday Hyponatremia  ? [facility] [name], [name], MD  ? 1 month ago Edema, unspecified type  ? [facility]  [name], [name], MD  ? ?  ?  ?Future Appointments   ? ?        ? In 1 week Marykay Lex, MD [facility]  ? In 6 months [name], [name], MD [facility]  ? ?  ? ?  ?  ?  ? ?

## 2022-01-09 ENCOUNTER — Encounter (INDEPENDENT_AMBULATORY_CARE_PROVIDER_SITE_OTHER): Payer: Self-pay | Admitting: Family Medicine

## 2022-01-09 ENCOUNTER — Ambulatory Visit (INDEPENDENT_AMBULATORY_CARE_PROVIDER_SITE_OTHER): Payer: BC Managed Care – PPO | Admitting: Family Medicine

## 2022-01-09 ENCOUNTER — Other Ambulatory Visit: Payer: Self-pay

## 2022-01-09 VITALS — BP 130/60 | HR 67 | Temp 97.7°F | Ht 65.0 in | Wt 295.0 lb

## 2022-01-09 DIAGNOSIS — I82431 Acute embolism and thrombosis of right popliteal vein: Secondary | ICD-10-CM

## 2022-01-09 DIAGNOSIS — R600 Localized edema: Secondary | ICD-10-CM

## 2022-01-09 DIAGNOSIS — Z6841 Body Mass Index (BMI) 40.0 and over, adult: Secondary | ICD-10-CM

## 2022-01-09 DIAGNOSIS — E669 Obesity, unspecified: Secondary | ICD-10-CM | POA: Diagnosis not present

## 2022-01-09 DIAGNOSIS — R7303 Prediabetes: Secondary | ICD-10-CM | POA: Diagnosis not present

## 2022-01-11 ENCOUNTER — Ambulatory Visit: Payer: BC Managed Care – PPO | Admitting: Cardiology

## 2022-01-11 ENCOUNTER — Other Ambulatory Visit: Payer: Self-pay

## 2022-01-11 ENCOUNTER — Encounter: Payer: Self-pay | Admitting: Cardiology

## 2022-01-11 VITALS — BP 120/80 | HR 88 | Ht 64.5 in | Wt 299.0 lb

## 2022-01-11 DIAGNOSIS — E782 Mixed hyperlipidemia: Secondary | ICD-10-CM

## 2022-01-11 DIAGNOSIS — I82431 Acute embolism and thrombosis of right popliteal vein: Secondary | ICD-10-CM | POA: Diagnosis not present

## 2022-01-11 DIAGNOSIS — Z6841 Body Mass Index (BMI) 40.0 and over, adult: Secondary | ICD-10-CM

## 2022-01-11 DIAGNOSIS — R600 Localized edema: Secondary | ICD-10-CM

## 2022-01-11 NOTE — Patient Instructions (Signed)
Medication Instructions:  ? ?Your physician recommends that you continue on your current medications as directed. Please refer to the Current Medication list given to you today.  ? ?*If you need a refill on your cardiac medications before your next appointment, please call your pharmacy* ? ? ?Lab Work: ?None ordered ? ?If you have labs (blood work) drawn today and your tests are completely normal, you will receive your results only by: ?MyChart Message (if you have MyChart) OR ?A paper copy in the mail ?If you have any lab test that is abnormal or we need to change your treatment, we will call you to review the results. ? ? ?Testing/Procedures: ? ?Your provider has ordered an ankle-brachial index vascular ultrasound to be completed in 3-4 months. This will be completed at our [facility] office. There is no preparation for this test.  ? ? ?Follow-Up: ?At [facility], you and your health needs are our priority.  As part of our continuing mission to provide you with exceptional heart care, we have created designated Provider Care Teams.  These Care Teams include your primary Cardiologist (physician) and Advanced Practice Providers (APPs -  Physician Assistants and Nurse Practitioners) who all work together to provide you with the care you need, when you need it. ? ?We recommend signing up for the patient portal called "MyChart".  Sign up information is provided on this After Visit Summary.  MyChart is used to connect with patients for Virtual Visits (Telemedicine).  Patients are able to view lab/test results, encounter notes, upcoming appointments, etc.  Non-urgent messages can be sent to your provider as well.   ?To learn more about what you can do with MyChart, go to [email or web address].   ? ?Your next appointment:   ?6 month(s) ? ?The format for your next appointment:   ?In Person ? ?Provider:   ?You may see Bryan Lemma, MD or one of the following Advanced Practice Providers on your designated Care Team:    ?Nicolasa Ducking, NP ?Eula Listen, PA-C ?Cadence Fransico Michael, PA-C ? ? ?Other Instructions ?N/A ?

## 2022-01-11 NOTE — Progress Notes (Signed)
? ? ?Primary Care Provider: [name], MD ?Cardiologist: Bryan Lemma, MD ?Electrophysiologist: None ? ?Clinic Note: ?Chief Complaint  ?Patient presents with  ? Other  ?  Follow up post Testing -- Meds reviewed verbally with patient.   ? ?=================================== ? ?ASSESSMENT/PLAN  ? ?Problem List Items Addressed This Visit   ? ?  ? Cardiology Problems  ? 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  Interestingly this was noted after her neck surgery.  It is quite possible that that the DVT occurred as result of being sedentary from the surgery.  Regardless, she has the DVT and is on Xarelto.  Will probably consider least 3 months and then reassess both for persistent DVT as well as for venous reflux. ? ? ?Continue to recommend foot elevation, and after 1 month of anticoagulation, with then also recommend support stockings.  (Medium weight) ? ?  ?  ? Mixed hyperlipidemia (Chronic)  ?  Based on recent labs, atorvastatin dose was recently increased to 40 mg.  Target LDL less than 100 with obesity and borderline hypertension. ? ?We discussed possibility of checking Coronary Calcium Score, however since she is already on significant dose of atorvastatin, she is fine with holding off for now. ? ?  ?  ?  ? Other  ? Morbid obesity with BMI of 50.0-59.9, adult (HCC) (Chronic)  ?  Stressed the importance of weight loss.  Likely playing a role in dyspnea with significant deconditioning, all contributing to back pain and reduced activity. ? ?Would probably consider the possibility of GLP-1 agonist and even consider the possibility of bariatric surgery. ? ?  ?  ? Bilateral lower extremity edema - Primary (Chronic)  ?  Unexpectedly, found to have DVT.  Unfortunately the rest of the study was not completed and therefore after she completes her 3 months of Xarelto, we will reassess venous Dopplers for signs of venous insufficiency and/or residual thrombus. ? ? ?Would  also recommend after 1 month of anticoagulation, using leg elevation and support stockings. ? ?  ?  ? Relevant Orders  ? VAS [place] ABI WITH/WO TBI  ? VAS [place] LOWER EXTREMITY VENOUS REFLUX  ? ? ?=================================== ? ?HPI:   ? ?[name] is a morbidly obese 46 y.o. female with a PMH notable for OSA-CPAP, fibromyalgia, GERD, intermittent asthma, hyperlipidemia and recent neck surgery who presents today for 20-month follow-up. ? ?[name] was seen on for initial consultation for preop evaluation for upcoming neck surgery on November 23, 2021.  She noted some mild lower extremity swelling, so we checked an echocardiogram and ordered lower extremity venous duplex Dopplers to evaluate for venous insufficiency.  ?=> Interestingly, the venous Dopplers were done after her neck surgery and revealed DVT noted below. ?Wrote for as needed Lasix to use in lieu of HCTZ. ?Plan was to consider coronary artery calcium scoring in follow-up. ? ?Recent Hospitalizations:  ?12/13/2021: Anterior lateral lumbar interbody fusion (Dr. [name]) ? ?She was seen for work in visit on 01/03/2022 by Dr. Tresa Endo at the [facility] after she was found to have a right lower extremity DVT.  Overall relatively asymptomatic.  1/6 SEM heard on exam.  Right leg swelling noted. ?Given prescription for Xarelto 15 mg twice daily for 21 days then reduce to 20 mg daily.  Told to avoid NSAIDs. ? ?Reviewed  CV studies:   ? ?The following studies were reviewed today: (if available, images/films reviewed: From Epic Chart or  Care Everywhere) ?TTE 11/30/2021: Normal LV size and function.  EF 60 to 65%.  Mild LVH.  GR 1 DD.  No MR.  Normal LA, RA size.  Normal RV size and function.  Unable to assess RVP.  Normal estimated RAP.  Normal valves. ?Lower Extremity Venous Dopplers 01/03/2022: Findings consistent with acute right popliteal vein DVT.  Normal left lower extremity. ? ?Interval History:  ? ?[name] returns here today actually doing  okay.  Her swelling seems to be better.  She has already started taking her Xarelto.  No PND or orthopnea.  No exertional dyspnea.  No sensation to suggest PE. ? ?CV Review of Symptoms (Summary) ?Cardiovascular ROS: no chest pain or dyspnea on exertion ?positive for - -right leg swelling improved ?negative for - irregular heartbeat, orthopnea, palpitations, paroxysmal nocturnal dyspnea, rapid heart rate, shortness of breath, or syncope/near syncope or TIA/amaurosis fugax, claudication ? ?REVIEWED OF SYSTEMS  ? ?Pertinent ROS: ?-> Exercise intolerance related to Fatigue deconditioning.   ?Has noted some weight loss GLP1agonist.   ?Only has shortness of breath allergies but not real exertional dyspnea. ?Back knee and hip pain. ?Mild vertigo ?Stable anxiety and difficulty sleeping. ? ?I have reviewed and (if needed) personally updated the patient's problem list, medications, allergies, past medical and surgical history, social and family history.  ? ?PAST MEDICAL HISTORY  ? ?Past Medical History:  ?Diagnosis Date  ? ADD (attention deficit disorder)   ? Allergies   ? Anemia   ? Arthritis   ? Asthma   ? EXERCISE INDUCED  ? B12 deficiency   ? Bilateral ovarian cysts 11/13/2012  ? Bursitis of hip   ? Chronic back pain   ? Depression   ? Family history of adverse reaction to anesthesia   ? patient states father had a reaction to anesthesia about 20 years ago where his face and throat swelled, had to be reintubated."  ? Family history of celiac disease 07/28/2018  ? Family history of rheumatoid arthritis 08/27/2018  ? Maternal uncle  ? Fibromyalgia   ? Flat foot 08/27/2018  ? Status post bilateral foot reconstruction surgery and Achilles tendon lengthening in childhood.  ? Generalized headaches   ? GERD (gastroesophageal reflux disease)   ? H/O laparoscopic adjustable gastric banding 07/29/2013  ? 10/17/10   ? History of removal of laparoscopic gastric banding device 02/03/2018  ? Irregular menses   ? Joint pain   ? Lactose  intolerance   ? Lower extremity edema   ? Migraines   ? Muscle pain   ? Obesity (BMI 30-39.9)   ? Osteoarthritis   ? Other allergic rhinitis 05/24/2020  ? Ovarian retention cyst 11/13/2012  ? Sleep apnea   ? SOB (shortness of breath)   ? Stomach ulcer   ? Swallowing difficulty   ? Vision problems   ? Vitamin D deficiency   ? ? ?PAST SURGICAL HISTORY  ? ?Past Surgical History:  ?Procedure Laterality Date  ? ABDOMINAL HYSTERECTOMY    ? ACHILLES TENDON LENGTHENING  1989  ? ANKLE GANGLION CYST EXCISION  02/2012  ? left ankle  ? ANTERIOR LAT LUMBAR FUSION Right 05/11/2019  ? Procedure: ANTERIOR LATERAL LUMBAR INTERBODY FUSION, LATERAL INSTRUMENTATION, RIGHT LUMBAR TWO- LUMBAR THREE;  Surgeon: Tressie StalkerJenkins, Jeffrey, MD;  [facility];  Service: Neurosurgery;  Laterality: Right;  ANTERIOR LATERAL LUMBAR INTERBODY FUSION, LATERAL INSTRUMENTATION, RIGHT LUMBAR TWO- LUMBAR THREE  ? BIOPSY  03/16/2019  ? Procedure: BIOPSY;  Surgeon: Corbin Adeourk, Robert M, MD;  Location:  [facility];  Service: Endoscopy;;  Gastric ?  ? COLONOSCOPY WITH PROPOFOL N/A 03/16/2019  ? Dr. Jena Gauss: Normal terminal ileum, 15 cm.  Normal colon.  Next colonoscopy 10 years  ? DILATION AND CURETTAGE OF UTERUS  2003  ? ENTEROSCOPY N/A 04/02/2019  ? Dr. Jena Gauss: bulbar erosions, jejunal ulcerations, attempted biopsy but scope fell backwards and lesions cannot be reidentified.  No evidence of ampullary lesion or mass.  ? ESOPHAGOGASTRODUODENOSCOPY (EGD) WITH PROPOFOL N/A 03/16/2019  ? Dr. Jena Gauss: Large hiatal hernia, Cameron/antral erosions, duodenal erosions.  Gastric biopsy showed reactive gastropathy, mild chronic gastritis, negative for H. pylori  ? FEET SURGERY  1990  ? INSERTION OF BONE GRAFT IN FEET  ? GIVENS CAPSULE STUDY N/A 03/16/2019  ? Procedure: GIVENS CAPSULE STUDY;  Surgeon: Corbin Ade, MD;  Location: [facility];  Service: Endoscopy;  Laterality: N/A;  ? LAPAROSCOPIC GASTRIC BANDING  10/17/2010  ? Dr Gaynelle Adu; [facility] Standard  ? LAPAROSCOPIC REPAIR  AND REMOVAL OF GASTRIC BAND  01/2018  ? LIPOMA EXCISION  02/2006  ? abdomen  ? SPINE SURGERY N/A   ? [employer] 11/10/2020  ? TRANSTHORACIC ECHOCARDIOGRAM  11/2021  ? Normal LV size and function.  EF 60 to 65%.

## 2022-01-11 NOTE — Progress Notes (Signed)
Chief Complaint:   OBESITY [name] is here to discuss her progress with her obesity treatment plan along with follow-up of her obesity related diagnoses. See Medical Weight Management Flowsheet for complete bioelectrical impedance results.  Today's visit was #: 19 Starting weight: 306 lbs Starting date: 05/19/2020 Weight change since last visit: +6 lbs Total lbs lost to date: 11 lbs Total weight loss percentage to date: -3.59%  Nutrition Plan: Keeping a food journal and adhering to recommended goals of 1500 calories and 100 grams of protein daily for 25% of the time. Activity: Increased walking. Anti-obesity medications: Mounjaro 7.5 mg subcutaneously weekly. Reported side effects: None.  Interim History: [name] has a new right lower extremity DVT.  She says that Lasix is helping her lower extremity edema.  She has decreased her gabapentin.  She is status post back surgery 3 weeks ago.  She says she feels much better.  Assessment/Plan:   1. Prediabetes, with polyphagia Not at goal. Goal is HgbA1c < 5.7.  Medication: Mounjaro 7.5 mg subcutaneously weekly.    Plan: Continue Mounjaro 7.5 mg subcutaneously weekly.  She will continue to focus on protein-rich, low simple carbohydrate foods. We reviewed the importance of hydration, regular exercise for stress reduction, and restorative sleep.   Lab Results  Component Value Date   HGBA1C 5.3 12/27/2021   Lab Results  Component Value Date   INSULIN 30.2 (H) 07/26/2021   INSULIN 40.6 (H) 09/12/2020   2. Acute deep vein thrombosis (DVT) of popliteal vein of right lower extremity (HCC) She is taking Xarelto 15 mg twice daily to start and will switch to 20 mg daily after 22 days. We will continue to monitor symptoms as they relate to her weight loss journey.  3. Localized edema, bilateral LE [name] is taking Lasix 20 mg daily for edema.  The current medical regimen is effective;  continue present plan and medications.We will  continue to monitor symptoms as they relate to her weight loss journey.  4. Obesity with current BMI of 49.1  Course: [name] is currently in the action stage of change. As such, her goal is to continue with weight loss efforts.   Nutrition goals: She has agreed to keeping a food journal and adhering to recommended goals of 1500 calories and 100 grams of protein.   Exercise goals:  As is.  Behavioral modification strategies: increasing lean protein intake, decreasing simple carbohydrates, and increasing vegetables.  [name] has agreed to follow-up with our clinic in 4 weeks. She was informed of the importance of frequent follow-up visits to maximize her success with intensive lifestyle modifications for her multiple health conditions.   Objective:   Blood pressure 130/60, pulse 67, temperature 97.7 F (36.5 C), temperature source Oral, height 5\' 5"  (1.651 m), weight 295 lb (133.8 kg), last menstrual period 11/07/2012, SpO2 98 %. Body mass index is 49.09 kg/m.  General: Cooperative, alert, well developed, in no acute distress. HEENT: Conjunctivae and lids unremarkable. Cardiovascular: Regular rhythm.  Lungs: Normal work of breathing. Neurologic: No focal deficits.   Lab Results  Component Value Date   CREATININE 1.47 (H) 01/03/2022   BUN 12 01/03/2022   NA 141 01/03/2022   K 4.7 01/03/2022   CL 104 01/03/2022   CO2 22 01/03/2022   Lab Results  Component Value Date   ALT 23 01/03/2022   AST 22 01/03/2022   ALKPHOS 98 01/03/2022   BILITOT <0.2 01/03/2022   Lab Results  Component Value Date   HGBA1C 5.3  12/27/2021   HGBA1C 5.6 07/26/2021   HGBA1C 5.5 02/22/2021   HGBA1C 5.9 (H) 09/12/2020   HGBA1C 5.7 (H) 05/13/2020   Lab Results  Component Value Date   INSULIN 30.2 (H) 07/26/2021   INSULIN 40.6 (H) 09/12/2020   Lab Results  Component Value Date   TSH 3.130 12/27/2021   Lab Results  Component Value Date   CHOL 194 12/27/2021   HDL 36 (L) 12/27/2021    LDLCALC 123 (H) 12/27/2021   TRIG 197 (H) 12/27/2021   CHOLHDL 5.4 (H) 12/27/2021   Lab Results  Component Value Date   VD25OH 42.7 07/26/2021   VD25OH 51.5 02/22/2021   VD25OH 24.4 (L) 09/12/2020   Lab Results  Component Value Date   WBC 7.5 12/27/2021   HGB 12.8 12/27/2021   HCT 39.1 12/27/2021   MCV 94 12/27/2021   PLT 364 12/27/2021   Lab Results  Component Value Date   IRON 68 02/22/2021   TIBC 315 02/22/2021   FERRITIN 119 02/22/2021   Attestation Statements:   Reviewed by clinician on day of visit: allergies, medications, problem list, medical history, surgical history, family history, social history, and previous encounter notes.  I, Insurance claims handler, CMA, am acting as transcriptionist for Helane Rima, DO  I have reviewed the above documentation for accuracy and completeness, and I agree with the above. -  Helane Rima, DO, MS, FAAFP, DABOM - Family and Bariatric Medicine.

## 2022-02-11 NOTE — Assessment & Plan Note (Signed)
Interestingly this was noted after her neck surgery.  It is quite possible that that the DVT occurred as result of being sedentary from the surgery.  Regardless, she has the DVT and is on Xarelto.  Will probably consider least 3 months and then reassess both for persistent DVT as well as for venous reflux. ? ? ?Continue to recommend foot elevation, and after 1 month of anticoagulation, with then also recommend support stockings.  (Medium weight) ?

## 2022-02-11 NOTE — Assessment & Plan Note (Signed)
Stressed the importance of weight loss.  Likely playing a role in dyspnea with significant deconditioning, all contributing to back pain and reduced activity. ? ?Would probably consider the possibility of GLP-1 agonist and even consider the possibility of bariatric surgery. ?

## 2022-02-11 NOTE — Assessment & Plan Note (Signed)
Unexpectedly, found to have DVT.  Unfortunately the rest of the study was not completed and therefore after she completes her 3 months of Xarelto, we will reassess venous Dopplers for signs of venous insufficiency and/or residual thrombus. ? ? ?Would also recommend after 1 month of anticoagulation, using leg elevation and support stockings. ?

## 2022-02-11 NOTE — Assessment & Plan Note (Signed)
Based on recent labs, atorvastatin dose was recently increased to 40 mg.  Target LDL less than 100 with obesity and borderline hypertension. ? ?We discussed possibility of checking Coronary Calcium Score, however since she is already on significant dose of atorvastatin, she is fine with holding off for now. ?

## 2022-02-22 ENCOUNTER — Encounter: Payer: Self-pay | Admitting: Cardiovascular Disease

## 2022-02-22 ENCOUNTER — Ambulatory Visit: Payer: BC Managed Care – PPO | Admitting: Cardiovascular Disease

## 2022-02-22 ENCOUNTER — Other Ambulatory Visit
Admission: RE | Admit: 2022-02-22 | Discharge: 2022-02-22 | Disposition: A | Discharge status: Home / Self Care | Payer: BC Managed Care – PPO | Source: Ambulatory Visit | Attending: Cardiovascular Disease | Admitting: Cardiovascular Disease

## 2022-02-22 VITALS — BP 118/84 | HR 76 | Ht 64.5 in | Wt 310.5 lb

## 2022-02-22 DIAGNOSIS — R7303 Prediabetes: Secondary | ICD-10-CM | POA: Diagnosis not present

## 2022-02-22 DIAGNOSIS — Z9989 Dependence on other enabling machines and devices: Secondary | ICD-10-CM

## 2022-02-22 DIAGNOSIS — G4733 Obstructive sleep apnea (adult) (pediatric): Secondary | ICD-10-CM

## 2022-02-22 DIAGNOSIS — M79604 Pain in right leg: Secondary | ICD-10-CM | POA: Diagnosis not present

## 2022-02-22 DIAGNOSIS — M7989 Other specified soft tissue disorders: Secondary | ICD-10-CM

## 2022-02-22 DIAGNOSIS — Z6841 Body Mass Index (BMI) 40.0 and over, adult: Secondary | ICD-10-CM

## 2022-02-22 DIAGNOSIS — I82431 Acute embolism and thrombosis of right popliteal vein: Secondary | ICD-10-CM

## 2022-02-22 DIAGNOSIS — E782 Mixed hyperlipidemia: Secondary | ICD-10-CM

## 2022-02-22 LAB — BASIC METABOLIC PANEL
Anion gap: 5 (ref 5–15)
BUN: 11 mg/dL (ref 6–20)
CO2: 26 mmol/L (ref 22–32)
Calcium: 9 mg/dL (ref 8.9–10.3)
Chloride: 107 mmol/L (ref 98–111)
Creatinine, Ser: 0.89 mg/dL (ref 0.44–1.00)
GFR, Estimated: >60 mL/min (ref >60)
Glucose, Bld: 116 mg/dL — ABNORMAL HIGH (ref 70–99)
Potassium: 3.7 mmol/L (ref 3.5–5.1)
Sodium: 138 mmol/L (ref 135–145)

## 2022-02-22 LAB — BRAIN NATRIURETIC PEPTIDE: B Natriuretic Peptide: 16.1 pg/mL (ref 0.0–100.0)

## 2022-02-22 NOTE — Progress Notes (Signed)
Cardiology Office Note ? ?Date:  02/22/2022  ? ?ID:  [name], DOB September 08, 1976, MRN [id number] ? ?PCP:  Loura PardonVigg, [name], MD  ? ?Chief Complaint  ?Patient presents with  ? Leg Swelling  ?  Right leg pain with and without walking, LE edema and shortness of breath with over exertion. Medications reviewed by the patient verblally.   ? ? ?HPI:  ?[name] is a 46 year old woman with past medical history of ?DVT after back surgery 01/03/22 ?Hyperlipidemia ?Morbid obesity ?Lower extremity edema ?Fibromyalgia ?Patient of Dr. Herbie BaltimoreHarding  ?Who presents to the clinic for right leg pain with and without walking ? ?seen for work in visit on 01/03/2022 [facility] after she was found to have a right lower extremity DVT. ? ?Echocardiogram performed November 30, 2021 ? 1. Left ventricular ejection fraction, by estimation, is 60 to 65%. The  ?left ventricle has normal function. The left ventricle has no regional  ?wall motion abnormalities. There is mild left ventricular hypertrophy.  ?Left ventricular diastolic parameters  ?are consistent with Grade I diastolic dysfunction (impaired relaxation).  ? 2. Right ventricular systolic function is normal. The right ventricular  ?size is normal. Tricuspid regurgitation signal is inadequate for assessing  ?PA pressure.  ? 3. The mitral valve is normal in structure. No evidence of mitral valve  ?regurgitation. No evidence of mitral stenosis.  ? 4. The aortic valve was not well visualized. Aortic valve regurgitation  ?is not visualized. No aortic stenosis is present.  ? 5. The inferior vena cava is normal in size with greater than 50%  ?respiratory variability, suggesting right atrial pressure of 3 mmHg.  ? ?In follow-up today she reports weight gain despite her best efforts at losing weight through healthy wellness clinic ?Continued lower extremity edema despite taking Lasix daily ?Recent pain and swelling right lower thigh and medial aspect of her thigh ?Tries to  keep her legs elevated, does not use compression hose or leg wraps ? ?EKG personally reviewed by myself on todays visit ?Normal sinus rhythm with rate 76 bpm no significant ST-T wave changes ? ? ?PMH:   has a past medical history of ADD (attention deficit disorder), Allergies, Anemia, Arthritis, Asthma, B12 deficiency, Bilateral ovarian cysts (11/13/2012), Bursitis of hip, Chronic back pain, Depression, Family history of adverse reaction to anesthesia, Family history of celiac disease (07/28/2018), Family history of rheumatoid arthritis (08/27/2018), Fibromyalgia, Flat foot (08/27/2018), Generalized headaches, GERD (gastroesophageal reflux disease), H/O laparoscopic adjustable gastric banding (07/29/2013), History of removal of laparoscopic gastric banding device (02/03/2018), Irregular menses, Joint pain, Lactose intolerance, Lower extremity edema, Migraines, Muscle pain, Obesity (BMI 30-39.9), Osteoarthritis, Other allergic rhinitis (05/24/2020), Ovarian retention cyst (11/13/2012), Sleep apnea, SOB (shortness of breath), Stomach ulcer, Swallowing difficulty, Vision problems, and Vitamin D deficiency. ? ?PSH:    ?Past Surgical History:  ?Procedure Laterality Date  ? ABDOMINAL HYSTERECTOMY    ? ACHILLES TENDON LENGTHENING  1989  ? ANKLE GANGLION CYST EXCISION  02/2012  ? left ankle  ? ANTERIOR LAT LUMBAR FUSION Right 05/11/2019  ? Procedure: ANTERIOR LATERAL LUMBAR INTERBODY FUSION, LATERAL INSTRUMENTATION, RIGHT LUMBAR TWO- LUMBAR THREE;  Surgeon: Tressie StalkerJenkins, Jeffrey, MD;  [facility];  Service: Neurosurgery;  Laterality: Right;  ANTERIOR LATERAL LUMBAR INTERBODY FUSION, LATERAL INSTRUMENTATION, RIGHT LUMBAR TWO- LUMBAR THREE  ? BIOPSY  03/16/2019  ? Procedure: BIOPSY;  Surgeon: Corbin Adeourk, Robert M, MD;  Location: [facility] SU[facility];  Service: Endoscopy;;  Gastric ?  ? COLONOSCOPY WITH PROPOFOL N/A 03/16/2019  ?  Dr. Jena Gauss: Normal terminal ileum, 15 cm.  Normal colon.  Next colonoscopy 10 years  ? DILATION AND CURETTAGE OF UTERUS   2003  ? ENTEROSCOPY N/A 04/02/2019  ? Dr. Jena Gauss: bulbar erosions, jejunal ulcerations, attempted biopsy but scope fell backwards and lesions cannot be reidentified.  No evidence of ampullary lesion or mass.  ? ESOPHAGOGASTRODUODENOSCOPY (EGD) WITH PROPOFOL N/A 03/16/2019  ? Dr. Jena Gauss: Large hiatal hernia, Cameron/antral erosions, duodenal erosions.  Gastric biopsy showed reactive gastropathy, mild chronic gastritis, negative for H. pylori  ? FEET SURGERY  1990  ? INSERTION OF BONE GRAFT IN FEET  ? GIVENS CAPSULE STUDY N/A 03/16/2019  ? Procedure: GIVENS CAPSULE STUDY;  Surgeon: Corbin Ade, MD;  Location: [facility];  Service: Endoscopy;  Laterality: N/A;  ? LAPAROSCOPIC GASTRIC BANDING  10/17/2010  ? Dr Gaynelle Adu; [facility] Standard  ? LAPAROSCOPIC REPAIR AND REMOVAL OF GASTRIC BAND  01/2018  ? LIPOMA EXCISION  02/2006  ? abdomen  ? SPINE SURGERY N/A   ? [employer] 11/10/2020  ? TRANSTHORACIC ECHOCARDIOGRAM  11/2021  ? Normal LV size and function.  EF 60 to 65%.  Mild LVH.  GR 1 DD.  No MR.  Normal LA, RA size.  Normal RV size and function.  Unable to assess RVP.  Normal estimated RAP.  Normal valves.  ? ? ?Current Outpatient Medications  ?Medication Sig Dispense Refill  ? albuterol (VENTOLIN HFA) 108 (90 Base) MCG/ACT inhaler Inhale 2 puffs into the lungs every 6 (six) hours as needed for wheezing.    ? atorvastatin (LIPITOR) 40 MG tablet Take 1 tablet (40 mg total) by mouth daily. 90 tablet 3  ? busPIRone (BUSPAR) 10 MG tablet Take 1 tablet (10 mg total) by mouth 3 (three) times daily. 270 tablet 3  ? butalbital-acetaminophen-caffeine (FIORICET) 50-325-40 MG tablet Take 1 tablet by mouth every 6 (six) hours as needed for headache. 30 tablet 0  ? DULoxetine (CYMBALTA) 60 MG capsule Take 1 capsule (60 mg total) by mouth 2 (two) times daily. 90 capsule 3  ? furosemide (LASIX) 20 MG tablet Take 20 mg by mouth daily.    ? gabapentin (NEURONTIN) 300 MG capsule Take 1 capsule (300 mg total) by mouth 3 (three) times  daily. 270 capsule 3  ? loratadine (CLARITIN) 10 MG tablet Take 10 mg by mouth daily.    ? Probiotic Product (PROBIOTIC BLEND PO) daily.    ? propranolol (INDERAL) 20 MG tablet Take 1 tablet (20 mg total) by mouth 3 (three) times daily as needed (anxiety). 90 tablet 3  ? rivaroxaban (XARELTO) 20 MG TABS tablet Take 1 tablet (20 mg total) by mouth daily with supper. FILL AFTER STARTER PACK 30 tablet 6  ? tirzepatide (MOUNJARO) 7.5 MG/0.5ML Pen Inject 7.5 mg into the skin once a week. 2 mL 2  ? traZODone (DESYREL) 100 MG tablet Take 1 tablet (100 mg total) by mouth at bedtime. 90 tablet 3  ? Vitamin D, Ergocalciferol, (DRISDOL) 1.25 MG (50000 UNIT) CAPS capsule Take 1 capsule (50,000 Units total) by mouth 2 (two) times a week. 24 capsule 0  ? EPINEPHrine 0.3 mg/0.3 mL IJ SOAJ injection Inject 0.3 mg into the muscle as needed for anaphylaxis. (Patient not taking: Reported on 02/22/2022)    ? ?No current facility-administered medications for this visit.  ? ? ? ?Allergies:   Cashew nut oil, Cauliflower [brassica oleracea], Whey, Augmentin [amoxicillin-pot clavulanate], Beeswax, Honey, Orange fruit [citrus], Wheat bran, Bee pollen, Gluten meal, Milk-related compounds, Other, Codeine,  and Tree extract  ? ?Social History:  The patient  reports that she has never smoked. She has never used smokeless tobacco. She reports that she does not drink alcohol and does not use drugs.  ? ?Family History:   family history includes Allergic rhinitis in her father, mother, and sister; Asthma in her father, mother, and sister; Cancer in her father, maternal grandfather, and paternal grandfather; Colon cancer in her maternal grandfather; Colon polyps in an other family member; Diabetes in her father, mother, and sister; Heart disease in her father and maternal grandmother; Hyperlipidemia in her father and mother; Hypertension in her father, mother, and sister; Migraines in her son; Pleurisy in her son; Urticaria in her father, mother, and  sister.  ? ? ?Review of Systems: ?Review of Systems  ?Constitutional: Negative.   ?HENT: Negative.    ?Respiratory: Negative.    ?Cardiovascular: Negative.   ?Gastrointestinal: Negative.   ?Musculoskeleta

## 2022-02-22 NOTE — Patient Instructions (Addendum)
Medication Instructions:  ?No changes ? ?If you need a refill on your cardiac medications before your next appointment, please call your pharmacy.  ? ? ?Lab work: ?No new labs needed ? ? ?Testing/Procedures: ?- Your physician recommends that you have lab work today: BMP, BNP ? ? ?Follow-Up: ?At [facility], you and your health needs are our priority.  As part of our continuing mission to provide you with exceptional heart care, we have created designated Provider Care Teams.  These Care Teams include your primary Cardiologist (physician) and Advanced Practice Providers (APPs -  Physician Assistants and Nurse Practitioners) who all work together to provide you with the care you need, when you need it. ? ?You will need a follow up appointment in July with Dr. Herbie Baltimore (just after you lower venous ultrasound) ? ?Providers on your designated Care Team:   ?Nicolasa Ducking, NP ?Eula Listen, PA-C ?Cadence Fransico Michael, PA-C ? ?COVID-19 Vaccine Information can be found at: [email or web address] For questions related to vaccine distribution or appointments, please email [email or web address] or call [phone].  ? ?

## 2022-03-24 ENCOUNTER — Other Ambulatory Visit: Payer: Self-pay | Admitting: Internal Medicine

## 2022-03-24 DIAGNOSIS — E559 Vitamin D deficiency, unspecified: Secondary | ICD-10-CM

## 2022-03-27 NOTE — Telephone Encounter (Signed)
Requested medication (s) are due for refill today: yes  Requested medication (s) are on the active medication list: yes  Last refill:  01/04/22  Future visit scheduled: no  Notes to clinic:  Route requests for 50,000 IU strength to the provider.     Requested Prescriptions  Pending Prescriptions Disp Refills   Vitamin D, Ergocalciferol, (DRISDOL) 1.25 MG (50000 UNIT) CAPS capsule [Pharmacy Med Name: VITAMIN D2 50,000IU (ERGO) CAP RX] 24 capsule 0    Sig: TAKE 1 CAPSULE BY MOUTH 2 TIMES A WEEK     Endocrinology:  Vitamins - Vitamin D Supplementation 2 Failed - 03/24/2022  6:00 PM      Failed - Manual Review: Route requests for 50,000 IU strength to the provider      Passed - Ca in normal range and within 360 days    Calcium  Date Value Ref Range Status  02/22/2022 9.0 8.9 - 10.3 mg/dL Final         Passed - Vitamin D in normal range and within 360 days    Vit D, 25-Hydroxy  Date Value Ref Range Status  07/26/2021 42.7 30.0 - 100.0 ng/mL Final    Comment:    Vitamin D deficiency has been defined by the Institute of Medicine and an Endocrine Society practice guideline as a level of serum 25-OH vitamin D less than 20 ng/mL (1,2). The Endocrine Society went on to further define vitamin D insufficiency as a level between 21 and 29 ng/mL (2). 1. IOM (Institute of Medicine). 2010. Dietary reference    intakes for calcium and D. [place]: The    [employer]. 2. Holick MF, Binkley [place], Bischoff-Ferrari HA, et al.    Evaluation, treatment, and prevention of vitamin D    deficiency: an Endocrine Society clinical practice    guideline. JCEM. 2011 Jul; 96(7):1911-30.          Passed - Valid encounter within last 12 months    Recent Outpatient Visits           2 months ago Hyponatremia   [facility] [name], [name], MD   4 months ago Edema, unspecified type   [facility] [name], MD       Future Appointments             In 1  month Herbie Baltimore, Piedad Climes, MD [facility]   In 3 months [name], [name], MD [facility], [facility]   In 3 months Herbie Baltimore, Piedad Climes, MD [facility], [facility]

## 2022-04-27 ENCOUNTER — Ambulatory Visit (HOSPITAL_COMMUNITY)
Admission: RE | Admit: 2022-04-27 | Discharge: 2022-04-27 | Disposition: A | Discharge status: Home / Self Care | Payer: BC Managed Care – PPO | Source: Ambulatory Visit | Attending: Cardiovascular Disease | Admitting: Cardiovascular Disease

## 2022-04-27 DIAGNOSIS — R600 Localized edema: Secondary | ICD-10-CM | POA: Diagnosis not present

## 2022-04-30 ENCOUNTER — Other Ambulatory Visit: Payer: Self-pay | Admitting: Cardiology

## 2022-04-30 DIAGNOSIS — I739 Peripheral vascular disease, unspecified: Secondary | ICD-10-CM

## 2022-05-03 ENCOUNTER — Encounter: Payer: Self-pay | Admitting: Cardiology

## 2022-05-03 ENCOUNTER — Ambulatory Visit (INDEPENDENT_AMBULATORY_CARE_PROVIDER_SITE_OTHER): Payer: BC Managed Care – PPO | Admitting: Cardiology

## 2022-05-03 VITALS — BP 118/85 | HR 75 | Ht 64.0 in | Wt 306.2 lb

## 2022-05-03 DIAGNOSIS — I82431 Acute embolism and thrombosis of right popliteal vein: Secondary | ICD-10-CM

## 2022-05-03 DIAGNOSIS — E782 Mixed hyperlipidemia: Secondary | ICD-10-CM

## 2022-05-03 DIAGNOSIS — R600 Localized edema: Secondary | ICD-10-CM

## 2022-05-03 MED ORDER — ASPIRIN 81 MG PO TBEC: 81.0000 mg | DELAYED_RELEASE_TABLET | Freq: Every day | ORAL | 3 refills | Status: DC

## 2022-05-03 NOTE — Patient Instructions (Signed)
Medication Instructions:  Your physician has recommended you make the following change in your medication:   STOP taking Xarelto after your current bottle is empty  START taking Aspirin 81 mg enteric coated. You can get this over the counter   *If you need a refill on your cardiac medications before your next appointment, please call your pharmacy*   Lab Work: None ordered  If you have labs (blood work) drawn today and your tests are completely normal, you will receive your results only by: MyChart Message (if you have MyChart) OR A paper copy in the mail If you have any lab test that is abnormal or we need to change your treatment, we will call you to review the results.   Testing/Procedures: None ordered   Follow-Up: At [facility], you and your health needs are our priority.  As part of our continuing mission to provide you with exceptional heart care, we have created designated Provider Care Teams.  These Care Teams include your primary Cardiologist (physician) and Advanced Practice Providers (APPs -  Physician Assistants and Nurse Practitioners) who all work together to provide you with the care you need, when you need it.  We recommend signing up for the patient portal called "MyChart".  Sign up information is provided on this After Visit Summary.  MyChart is used to connect with patients for Virtual Visits (Telemedicine).  Patients are able to view lab/test results, encounter notes, upcoming appointments, etc.  Non-urgent messages can be sent to your provider as well.   To learn more about what you can do with MyChart, go to [email or web address].    Your next appointment:   1 year(s)  The format for your next appointment:   In Person  Provider:   You may see Bryan Lemma, MD or one of the following Advanced Practice Providers on your designated Care Team:   Nicolasa Ducking, NP Eula Listen, PA-C Cadence Fransico Michael, [place]   Other Instructions N/A  Important  Information About Sugar

## 2022-05-03 NOTE — Progress Notes (Signed)
Primary Care Provider: Charlynne Cousins, MD Cardiologist: Glenetta Hew, MD Electrophysiologist: None  Clinic Note: Chief Complaint  Patient presents with   Follow-up    Following up after testing. Patient states that she has some swelling. Meds reviewed with patient.   DVT    Follow-up Dr. Wilburn Mylar resolved DVT.  Still has bilateral swelling but only left GSV reflux otherwise no significant reflux noted.   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  Has completed 3 months of the DOAC.  When she completes her current bottle of Xarelto, she will stop and then start on low-dose 81 mg aspirin-she does have some reflux with likely stasis.  The aspirin will be for prophylaxis.  Okay to hold aspirin 5 to 7 days preop for surgical procedure.  Continue foot elevation and support stockings.      Relevant Medications   aspirin EC 81 MG tablet   Mixed hyperlipidemia (Chronic)    Her last LDL was 123.  Based on her obesity as a risk factor, we talked about potential screening with Coronary Calcium Score or just simply treating with atorvastatin.  At this point think since she is already on atorvastatin and restarting aspirin, we will really gain that much more in the way of any pertinent information about taking Coronary Calcium Score as we would need would not change therapy that much. Can wait till mid 22s to risk stratify depending on what her lipids look at that time.      Relevant Medications   aspirin EC 81 MG tablet     Other   Bilateral lower extremity edema (Chronic)    DVT was relatively expected.  I think it was probably triggered by her postop state but cannot be sure.  As such after we complete her treatment with Xarelto, I think is reasonable to start her on aspirin 81 mg for  prophylaxis.  Continue support stockings, continue foot elevation, continue dorsiflexion/plantarflexion foot/ankle exercises.      ===================================  HPI:    [name] is a morbidly obese 46 y.o. female with a PMH notable for OSA-CPAP, fibromyalgia, GERD, intermittent asthma, hyperlipidemia and recent neck surgery who presents today for 39-month follow-up.  [name] was seen on for initial consultation for preop evaluation for upcoming neck surgery on November 23, 2021.  She noted some mild lower extremity swelling, so we checked an echocardiogram and ordered lower extremity venous duplex Dopplers to evaluate for venous insufficiency.  => Interestingly, the venous Dopplers were done after her neck surgery and revealed DVT noted below. (Seen by Dr. Claiborne Billings as DOD for DVT on 01/03/22- started Xarelto Rx Pack -50 mg twice daily for 21 days then 20 mg daily.)  I then saw her back on the 16th 2023 after testing.  Was doing well.  Swelling better.  Tolerating Xarelto.  Right swelling improved.  Some fatigue due to deconditioning.  Some weight loss on GLP-1 agonist but not that much.  Still limited walking by back and hip pain. Wrote for as needed Lasix to use in lieu of HCTZ. Recommended for elevation and then wear compression stockings at 1 month of anticoagulation. Follow-up venous Dopplers ordered to reassess DVT at 3 months Plan was to consider coronary artery calcium scoring in follow-up.  Recent Hospitalizations:  None  She was seen by Dr. Mariah Milling on 02/22/2022 in my stead because of emergency procedure.  Not much done.  No change.  Reviewed  CV studies:    The following studies were reviewed today: (if available, images/films reviewed: From Epic Chart or Care Everywhere) LE Venous Duplex 04/27/2022: No evidence of deep vein or superficial vein thrombosis on 8 on either lower extremity bilaterally.  No evidence of deep venous insufficiency bilaterally.  No  evidence of superficial venous reflux in the R GSV, and R Pop V DVT resolved.  L GSV did have venous reflux in the thigh only.    TTE 11/30/2021: Normal LV size and function.  EF 60 to 65%.  Mild LVH.  GR 1 DD.  No MR.  Normal LA, RA size.  Normal RV size and function.  Unable to assess RVP.  Normal estimated RAP.  Normal valves. Lower Extremity Venous Dopplers 01/03/2022: Findings consistent with acute right popliteal vein DVT.  Normal left lower extremity.  Interval History:   [name] returns here today overall doing better.  Her swelling has improved but really did not notice much benefit with the Lasix.  Is more actually wearing the support stockings and with foot elevation.  Despite having the edema she denies any PND orthopnea.  She is deconditioned and therefore does have exertional dyspnea but no worse than baseline.  No sudden onset chest discomfort or dyspnea.  No resting exertional chest pain or pressure. She is now wearing bilateral support stockings and really notices a difference if she does not put them on.  She says that the elevation [place] stockings have done much more than Lasix which makes sense.  No rapid irregular heartbeats palpitations-has been taking her per PRN propranolol just but every day for anxiety, and in doing so she has had less of the palpitations.  She does have some mild vertigo and dizziness symptoms but no syncope/near syncope or TIA/amaurosis fugax.  No claudication.   REVIEWED OF SYSTEMS   Pertinent ROS: Still notes exercise intolerance due to deconditioning and fatigue-but notes that hip and knee pain limit walking more so than anything. Some weight loss on GLP-1 agonist, not as much as she would like. Still not sleeping all that well but doing better with improved edema. Still has vertigo Anxiety better with taking the as needed propranolol in the morning.  I have reviewed and (if needed) personally updated the patient's problem list,  medications, allergies, past medical and surgical history, social and family history.   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   Generalized headaches    GERD (gastroesophageal reflux disease)    H/O laparoscopic adjustable gastric banding 07/29/2013   10/17/10  History of removal of laparoscopic gastric banding device 02/03/2018   Irregular menses    Joint pain    Lactose intolerance    Lower extremity edema    Migraines    Muscle pain    Obesity (BMI 30-39.9)    Osteoarthritis    Other allergic rhinitis 05/24/2020   Ovarian retention cyst 11/13/2012   Sleep apnea    SOB (shortness of breath)    Stomach ulcer    Swallowing difficulty    Vision problems    Vitamin D deficiency     PAST SURGICAL HISTORY   Past Surgical History:  Procedure Laterality Date   ABDOMINAL HYSTERECTOMY     ACHILLES TENDON LENGTHENING  1989   ANKLE GANGLION CYST EXCISION  02/2012   left ankle   ANTERIOR LAT LUMBAR FUSION Right 05/11/2019   Procedure: ANTERIOR LATERAL LUMBAR INTERBODY FUSION, LATERAL INSTRUMENTATION, RIGHT LUMBAR TWO- LUMBAR THREE;  Surgeon: Newman Pies, MD;  Location: [facility];  Service: Neurosurgery;  Laterality: Right;  ANTERIOR LATERAL LUMBAR INTERBODY FUSION, LATERAL INSTRUMENTATION, RIGHT LUMBAR TWO- LUMBAR THREE    BIOPSY  03/16/2019   Procedure: BIOPSY;  Surgeon: Daneil Dolin, MD;  [facility];  Service: Endoscopy;;  Gastric    COLONOSCOPY WITH PROPOFOL N/A 03/16/2019   Dr. Gala Romney: Normal terminal ileum, 15 cm.  Normal colon.  Next colonoscopy 10 years   [place] OF UTERUS  2003   ENTEROSCOPY N/A 04/02/2019   Dr. Gala Romney: bulbar erosions, jejunal ulcerations, attempted biopsy but scope fell backwards and lesions cannot be reidentified.  No evidence of ampullary lesion or mass.   ESOPHAGOGASTRODUODENOSCOPY (EGD) WITH PROPOFOL N/A 03/16/2019   Dr. Gala Romney: Large hiatal hernia, [place]/antral erosions, duodenal erosions.  Gastric biopsy showed reactive gastropathy, mild chronic gastritis, negative for H. pylori   FEET SURGERY  1990   INSERTION OF BONE GRAFT IN FEET   GIVENS CAPSULE STUDY N/A 03/16/2019   Procedure: GIVENS CAPSULE STUDY;  Surgeon: Daneil Dolin, MD;  [facility];  Service: Endoscopy;  Laterality: N/A;   LAPAROSCOPIC GASTRIC BANDING  10/17/2010   Dr Greer Pickerel; [facility]   LAPAROSCOPIC REPAIR AND REMOVAL OF GASTRIC BAND  01/2018   LIPOMA EXCISION  02/2006   abdomen   SPINE SURGERY N/A    [employer] 11/10/2020   TRANSTHORACIC ECHOCARDIOGRAM  11/2021   Normal LV size and function.  EF 60 to 65%.  Mild LVH.  GR 1 DD.  No MR.  Normal LA, RA size.  Normal RV size and function.  Unable to assess RVP.  Normal estimated RAP.  Normal valves.    Immunization History  Administered Date(s) Administered   Influenza Inj Mdck Quad Pf 09/23/2018, 07/26/2019   Influenza,inj,Quad PF,6+ Mos 07/15/2020   Influenza,inj,quad, With Preservative 08/29/2017   Influenza-Unspecified 09/12/2018   Moderna Sars-Covid-2 Vaccination 12/12/2019, 01/07/2020, 09/16/2020   Tdap 01/13/2018    MEDICATIONS/ALLERGIES   Current Meds  Medication Sig   albuterol (VENTOLIN HFA) 108 (90 Base) MCG/ACT inhaler Inhale 2 puffs into the lungs every 6 (six) hours as needed for  wheezing.   aspirin EC 81 MG tablet Take 1 tablet (81 mg total) by mouth daily. Swallow whole.   atorvastatin (LIPITOR) 40 MG tablet Take 1 tablet (40 mg total) by mouth daily.   busPIRone (BUSPAR) 10 MG tablet Take 1 tablet (10 mg total) by mouth 3 (three) times daily.   butalbital-acetaminophen-caffeine (FIORICET) 50-325-40 MG tablet Take 1 tablet by mouth every 6 (six) hours as  needed for headache.   DULoxetine (CYMBALTA) 60 MG capsule Take 1 capsule (60 mg total) by mouth 2 (two) times daily.   EPINEPHrine 0.3 mg/0.3 mL IJ SOAJ injection Inject 0.3 mg into the muscle as needed for anaphylaxis.   furosemide (LASIX) 20 MG tablet Take 20 mg by mouth daily.   gabapentin (NEURONTIN) 300 MG capsule Take 1 capsule (300 mg total) by mouth 3 (three) times daily. (Patient taking differently: Take 300 mg by mouth 2 (two) times daily.)   loratadine (CLARITIN) 10 MG tablet Take 10 mg by mouth daily.   Probiotic Product (PROBIOTIC BLEND PO) daily.   propranolol (INDERAL) 20 MG tablet Take 1 tablet (20 mg total) by mouth 3 (three) times daily as needed (anxiety).   tirzepatide (MOUNJARO) 7.5 MG/0.5ML Pen Inject 7.5 mg into the skin once a week.   traZODone (DESYREL) 100 MG tablet Take 1 tablet (100 mg total) by mouth at bedtime.   Vitamin D, Ergocalciferol, (DRISDOL) 1.25 MG (50000 UNIT) CAPS capsule TAKE 1 CAPSULE BY MOUTH 2 TIMES A WEEK   [DISCONTINUED] rivaroxaban (XARELTO) 20 MG TABS tablet Take 1 tablet (20 mg total) by mouth daily with supper. FILL AFTER STARTER PACK    Allergies  Allergen Reactions   Cashew Nut Oil Anaphylaxis   Cauliflower [Brassica Oleracea] Anaphylaxis   Whey Anaphylaxis   Augmentin [Amoxicillin-Pot Clavulanate] Diarrhea and Nausea And Vomiting    Has patient had a PCN reaction causing immediate rash, facial/tongue/throat swelling, SOB or lightheadedness with hypotension: No Has patient had a PCN reaction causing severe rash involving mucus membranes or skin necrosis: No Has  patient had a PCN reaction that required hospitalization: No Has patient had a PCN reaction occurring within the last 10 years: Unknown If all of the above answers are "NO", then may proceed with Cephalosporin use.    Beeswax Itching and Swelling   Honey Itching and Swelling   Orange Fruit [Citrus] Other (See Comments)    Migraines    Orange Oil Other (See Comments)   Wheat Bran Diarrhea   Bee Pollen Swelling   Gluten Meal Other (See Comments)    Severe GI upset   Milk-Related Compounds    Other     epidural steroid injection - severe headaches, high fever   Codeine Rash   Tree Extract Itching    Tee tree oil    SOCIAL HISTORY/FAMILY HISTORY   Reviewed in Epic:  Pertinent findings:  Social History   Tobacco Use   Smoking status: Never   Smokeless tobacco: Never  Vaping Use   Vaping Use: Never used  Substance Use Topics   Alcohol use: No   Drug use: No   Social History   Social History Narrative   Lives with husband married 18 years Dec 2021      Oldest is 25- son lives in [place]   12 daughter and 17 son year olds in home      Cat: Snickers       Enjoy: cooking some, puzzles, reading       Diet: eats all food groups outside allergies    Caffeine: 2 cans of diet soda daily and some tea   Water: 3 24 oz containers daily       Wears seat belt    Does not use phone while driving    Designer, multimedia -none           OBJCTIVE -PE, EKG, labs  Wt Readings from Last 3 Encounters:  05/03/22 (!) 306 lb 3.2 oz (138.9 kg)  02/22/22 (!) 310 lb 8 oz (140.8 kg)  01/11/22 299 lb (135.6 kg)  Unfortunately, her weight is gone up not down  Physical Exam: BP 118/85 (BP Location: Left Arm, Patient Position: Sitting, Cuff Size: Normal) Comment (BP Location): forearm  Pulse 75   Ht  (1.626 m)   Wt (!) 306 lb 3.2 oz (138.9 kg)   LMP 11/07/2012   SpO2 93%   BMI 52.56 kg/m  Physical Exam Vitals reviewed.  Constitutional:       General: She is not in acute distress.    Appearance: She is not toxic-appearing.     Comments: Super morbidly obese.  Otherwise well-groomed.  HENT:     Head: Normocephalic and atraumatic.  Neck:     Vascular: No carotid bruit.  Cardiovascular:     Rate and Rhythm: Normal rate and regular rhythm. No extrasystoles are present.    Chest Wall: PMI is not displaced (Distant).     Pulses: Intact distal pulses.     Heart sounds: S1 normal and S2 normal. Heart sounds are distant. No murmur heard.    No friction rub. No gallop.  Pulmonary:     Effort: Pulmonary effort is normal.     Breath sounds: Normal breath sounds. No wheezing, rhonchi or rales.  Chest:     Chest wall: No tenderness.  Musculoskeletal:        General: Swelling present.     Cervical back: Normal range of motion and neck supple.     Right lower leg: Edema (Nonpitting swelling noted, but improved.) present.     Left lower leg: Edema (Trivial) present.  Skin:    General: Skin is warm and dry.     Comments: No erythema of right leg.  Neurological:     General: No focal deficit present.     Mental Status: She is alert and oriented to person, place, and time.     Gait: Gait normal.  Psychiatric:        Mood and Affect: Mood normal.        Behavior: Behavior normal.        Thought Content: Thought content normal.        Judgment: Judgment normal.   -> She is wearing matching pink support stockings   Adult ECG Report Not checked  Recent Labs: Reviewed.  Atorvastatin dose just increased to 40 mg Lab Results  Component Value Date   CHOL 194 12/27/2021   HDL 36 (L) 12/27/2021   LDLCALC 123 (H) 12/27/2021   TRIG 197 (H) 12/27/2021   CHOLHDL 5.4 (H) 12/27/2021   Lab Results  Component Value Date   CREATININE 0.89 02/22/2022   BUN 11 02/22/2022   NA 138 02/22/2022   K 3.7 02/22/2022   CL 107 02/22/2022   CO2 26 02/22/2022      Latest Ref Rng & Units 12/27/2021    8:31 AM 07/26/2021    7:45 AM 02/22/2021     4:25 PM  CBC  WBC 3.4 - 10.8 x10E3/uL 7.5  7.9  8.4   Hemoglobin 11.1 - 15.9 g/dL 08.6  57.8  46.9   Hematocrit 34.0 - 46.6 % 39.1  44.4  40.2   Platelets 150 - 450 x10E3/uL 364  318  280     Lab Results  Component Value Date   HGBA1C 5.3 12/27/2021   Lab Results  Component Value Date  TSH 3.130 12/27/2021    ==================================================  COVID-19 Education: The signs and symptoms of COVID-19 were discussed with the patient and how to seek care for testing (follow up with PCP or arrange E-visit).    I spent a total of 23 minutes with the patient spent in direct patient consultation.  Additional time spent with chart review  / charting (studies, outside notes, etc): 14 min Total Time: 37 min  Current medicines are reviewed at length with the patient today.  (+/- concerns) none  This visit occurred during the SARS-CoV-2 public health emergency.  Safety protocols were in place, including screening questions prior to the visit, additional usage of staff PPE, and extensive cleaning of exam room while observing appropriate contact time as indicated for disinfecting solutions.  Notice: This dictation was prepared with Dragon dictation along with smart phrase technology. Any transcriptional errors that result from this process are unintentional and may not be corrected upon review.  Studies Ordered:  No orders of the defined types were placed in this encounter.  Meds ordered this encounter  Medications   aspirin EC 81 MG tablet    Sig: Take 1 tablet (81 mg total) by mouth daily. Swallow whole.    Dispense:  90 tablet    Refill:  3    Patient Instructions / Medication Changes & Studies & Tests Ordered   Patient Instructions  Medication Instructions:  Your physician has recommended you make the following change in your medication:   STOP taking Xarelto after your current bottle is empty  START taking Aspirin 81 mg enteric coated. You can get this over  the counter   *If you need a refill on your cardiac medications before your next appointment, please call your pharmacy*   Lab Work: None ordered  If you have labs (blood work) drawn today and your tests are completely normal, you will receive your results only by: [place] (if you have MyChart) OR A paper copy in the mail If you have any lab test that is abnormal or we need to change your treatment, we will call you to review the results.   Testing/Procedures: None ordered   Follow-Up: At [facility], you and your health needs are our priority.  As part of our continuing mission to provide you with exceptional heart care, we have created designated Provider Care Teams.  These Care Teams include your primary Cardiologist (physician) and Advanced Practice Providers (APPs -  Physician Assistants and Nurse Practitioners) who all work together to provide you with the care you need, when you need it.  We recommend signing up for the patient portal called "MyChart".  Sign up information is provided on this After Visit Summary.  MyChart is used to connect with patients for Virtual Visits (Telemedicine).  Patients are able to view lab/test results, encounter notes, upcoming appointments, etc.  Non-urgent messages can be sent to your provider as well.   To learn more about what you can do with MyChart, go to [email or web address].    Your next appointment:   1 year(s)  The format for your next appointment:   In Person  Provider:   You may see Glenetta Hew, MD or one of the following Advanced Practice Providers on your designated Care Team:   Murray Hodgkins, NP Christell Faith, PA-C Cadence Kathlen Mody, [place]   Other Instructions N/A  Important Information About Sugar           Glenetta Hew, M.D., M.S. Interventional Cardiologist   Pager # [phone]  Phone # [phone] [address] [place], [place] [address]   Thank you for choosing [facility]  [place]!!

## 2022-05-05 ENCOUNTER — Encounter: Payer: Self-pay | Admitting: Cardiology

## 2022-05-05 NOTE — Assessment & Plan Note (Signed)
Thrombus resolved.  Has completed 3 months of the DOAC.  When she completes her current bottle of Xarelto, she will stop and then start on low-dose 81 mg aspirin-she does have some reflux with likely stasis.  The aspirin will be for prophylaxis.  Okay to hold aspirin 5 to 7 days preop for surgical procedure.  Continue foot elevation and support stockings.

## 2022-05-05 NOTE — Assessment & Plan Note (Signed)
Her last LDL was 123.  Based on her obesity as a risk factor, we talked about potential screening with Coronary Calcium Score or just simply treating with atorvastatin.  At this point think since she is already on atorvastatin and restarting aspirin, we will really gain that much more in the way of any pertinent information about taking Coronary Calcium Score as we would need would not change therapy that much. Can wait till mid 57s to risk stratify depending on what her lipids look at that time.

## 2022-05-05 NOTE — Assessment & Plan Note (Signed)
DVT was relatively expected.  I think it was probably triggered by her postop state but cannot be sure.  As such after we complete her treatment with Xarelto, I think is reasonable to start her on aspirin 81 mg for prophylaxis.  Continue support stockings, continue foot elevation, continue dorsiflexion/plantarflexion foot/ankle exercises.

## 2022-05-07 ENCOUNTER — Telehealth: Payer: Self-pay | Admitting: Internal Medicine

## 2022-05-07 NOTE — Telephone Encounter (Signed)
Patient called and left a message that she wanted to repair her hernia and would like an appointment.  We don't repair hernias and would need to refer her to a surgeon.  Please advise.

## 2022-05-07 NOTE — Telephone Encounter (Signed)
This pt will need an appt to be seen with [place] first so we can have the documentation to refer her to see a surgeon. She seen Minerva Areola (no longer here) so Verlon Au also seen but whomever you can get her in with.

## 2022-05-07 NOTE — Telephone Encounter (Signed)
noted 

## 2022-05-09 NOTE — Progress Notes (Signed)
Primary Care Physician:  [name], MD  Primary GI: Dr. Jena Gauss  Patient Location: Home   Provider Location: [facility] office   Reason for Visit: RLQ abdominal pain, hiatal hernia    Persons present on the virtual encounter, with roles: Ermalinda Memos, PA-C (Provider), [name] (patient)   Total time (minutes) spent on medical discussion: 30 minutes  Virtual Visit via video note Due to COVID-19, visit is conducted virtually and was requested by patient.   I connected with [name] on 05/11/22 at  8:30 AM EDT by video and verified that I am speaking with the correct person using two identifiers.   I discussed the limitations, risks, security and privacy concerns of performing an evaluation and management service by video and the availability of in person appointments. I also discussed with the patient that there may be a patient responsible charge related to this service. The patient expressed understanding and agreed to proceed.  Chief Complaint  Patient presents with   Hernia    Patient being seen today to discuss the need for a hernia repair. She says she has some pain in the RLQ especially after an bowel movement. Denies any other current gi issues.     History of Present Illness: 46 y.o. female presenting today for further evaluation of RLQ abdominal pain and has questions about hiatal hernia.    Today she  reports that 2 years ago or so, started having occasional "stitch" in her low abdomen without any specific trigger.  Thought it may be related to an ovarian cyst as she has had these before.  Symptoms would come on once every month or so and did not last very long.;  However, for the last couple of months, she has had increased frequency of right lower quadrant abdominal pain, occurring at least 3-4 times a week, typically starting during or just after a bowel movement that is very intense in severity, causing her to double over.  The intense pain will last about 5  minutes, but she then has a constant, lingering, dull pain along with tenderness in her right lower quadrant that can last days at a time.  Sometimes, when she has the intense pain, if she puts some pressure on her right lower abdomen, it can ease it off a bit, but she does continue to have tenderness when palpating the area.  Sometimes she notices increased pain when she bends or twists a certain way when trying to wipe.  She has not been having any association with meals until yesterday.  She ate 1 chip with salsa and started having pain.  She denies any constipation or diarrhea.  Bowels move every other day and are soft, formed, incomplete.  She does not usually require any straining.  She did have a single episode of rectal bleeding last week when she had to strain, but states she did not drink as much water that day and passed a bit of a larger stool.  Blood was on toilet tissue and a very small amount on the end of the stool.  Denies any associated rectal pain.  Reports she did have a hemorrhoid years ago with similar symptoms.  She is not interested in a colonoscopy at this time.  Appendix in situ.  No abnormal vaginal bleeding or urinary symptoms. Doesn't have menstrual cycle any more s/p hysterectomy.    No NSAIDs. Hasn't started aspirin yet.  She has been on Xarelto due to DVT back in March after back  surgery, but today is her last day of Xarelto as she had negative follow-up ultrasound.  Also asking about her hiatal hernia and wonders if she is supposed to have surgery on this.  She denies any reflux, dysphagia, upper abdominal pain.     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  Band had been originally placed in 2011. She underwent EGD, colonoscopy, and capsule endoscopy.  She was found to have  large hiatal hernia, couple of Sheria Lang lesions, couple of antral erosions, bulbar erosions.  Mild chronic gastritis on biopsy, no H. pylori.  Colonoscopy was normal with recommendations for 10-year repeat.  Capsule endoscopy with some blood noted, but no associated lesion, thought to be secondary to upper GI suctioning/prior biopsy, possible small nonbleeding small bowel ulcer.  Follow-up small bowel enteroscopy with 3 areas of ulceration in proximal jejunum, multiple erosions in posterior bulb and second portion of the duodenum.  Attempted biopsy of additional lesions; however, scope pulled back and was unable to reidentify the ulcers demonstrated.  She had been following with hematology, but ultimately ferritin and hemoglobin normalized, last saw hematology in November 2020 as she was no longer needing iron and opted to follow-up with PCP for routine monitoring of her hemoglobin.  Most recent hemoglobin 12.8 on 12/27/2021.   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   Generalized headaches    GERD (gastroesophageal reflux disease)    H/O laparoscopic adjustable gastric banding 07/29/2013   10/17/10    History of removal of laparoscopic gastric banding device 02/03/2018   Irregular menses    Joint pain    Lactose intolerance    Lower extremity edema     Migraines    Muscle pain    Obesity (BMI 30-39.9)    Osteoarthritis    Other allergic rhinitis 05/24/2020   Ovarian retention cyst 11/13/2012   Sleep apnea    SOB (shortness of breath)    Stomach ulcer    Swallowing difficulty    Vision problems    Vitamin D deficiency      Past Surgical History:  Procedure Laterality Date   ABDOMINAL HYSTERECTOMY     ACHILLES TENDON LENGTHENING  1989   ANKLE GANGLION CYST EXCISION  02/2012   left ankle   ANTERIOR LAT LUMBAR FUSION Right 05/11/2019   Procedure: ANTERIOR LATERAL LUMBAR INTERBODY FUSION, LATERAL INSTRUMENTATION, RIGHT LUMBAR TWO- LUMBAR THREE;  Surgeon: Tressie Stalker, MD;  Location: [facility] OR;  Service: Neurosurgery;  Laterality: Right;  ANTERIOR LATERAL LUMBAR INTERBODY FUSION, LATERAL INSTRUMENTATION, RIGHT LUMBAR TWO- LUMBAR  THREE   BIOPSY  03/16/2019   Procedure: BIOPSY;  Surgeon: Corbin Ade, MD;  Location: [facility] SUITE;  Service: Endoscopy;;  Gastric    COLONOSCOPY WITH PROPOFOL N/A 03/16/2019   Dr. Jena Gauss: Normal terminal ileum, 15 cm.  Normal colon.  Next colonoscopy 10 years   DILATION AND CURETTAGE OF UTERUS  2003   ENTEROSCOPY N/A 04/02/2019   Dr. Jena Gauss: bulbar erosions, jejunal ulcerations, attempted biopsy but scope fell backwards and lesions cannot be reidentified.  No evidence of ampullary lesion or mass.   ESOPHAGOGASTRODUODENOSCOPY (EGD) WITH PROPOFOL N/A 03/16/2019   Dr. Jena Gauss: Large hiatal hernia, Cameron/antral erosions, duodenal erosions.  Gastric biopsy showed reactive gastropathy, mild chronic gastritis, negative for H. pylori   FEET SURGERY  1990   INSERTION OF BONE GRAFT IN FEET   GIVENS CAPSULE STUDY N/A 03/16/2019   Procedure: GIVENS CAPSULE STUDY;  Surgeon: Corbin Ade, MD;  Location: [facility];  Service: Endoscopy;  Laterality: N/A;   LAPAROSCOPIC GASTRIC BANDING  10/17/2010   Dr Gaynelle Adu; [facility]   LAPAROSCOPIC REPAIR AND REMOVAL OF GASTRIC BAND  01/2018   LIPOMA EXCISION  02/2006    abdomen   SPINE SURGERY N/A    [employer]rees[employer] 11/10/2020   TRANSTHORACIC ECHOCARDIOGRAM  11/2021   Normal LV size and function.  EF 60 to 65%.  Mild LVH.  GR 1 DD.  No MR.  Normal LA, RA size.  Normal RV size and function.  Unable to assess RVP.  Normal estimated RAP.  Normal valves.     Current Meds  Medication Sig   albuterol (VENTOLIN HFA) 108 (90 Base) MCG/ACT inhaler Inhale 2 puffs into the lungs every 6 (six) hours as needed for wheezing.   aspirin EC 81 MG tablet Take 1 tablet (81 mg total) by mouth daily. Swallow whole.   atorvastatin (LIPITOR) 40 MG tablet Take 1 tablet (40 mg total) by mouth daily.   busPIRone (BUSPAR) 10 MG tablet Take 1 tablet (10 mg total) by mouth 3 (three) times daily.   butalbital-acetaminophen-caffeine (FIORICET) 50-325-40 MG tablet Take 1 tablet by mouth every 6 (six) hours as needed for headache.   DULoxetine (CYMBALTA) 60 MG capsule Take 1 capsule (60 mg total) by mouth 2 (two) times daily.   EPINEPHrine 0.3 mg/0.3 mL IJ SOAJ injection Inject 0.3 mg into the muscle as needed for anaphylaxis.   furosemide (LASIX) 20 MG tablet Take 20 mg by mouth daily.   gabapentin (NEURONTIN) 300 MG capsule Take 1 capsule (300 mg total) by mouth 3 (three) times daily. (Patient taking differently: Take 300 mg by mouth 2 (two) times daily.)   loratadine (CLARITIN) 10 MG tablet Take 10 mg by mouth daily.   Probiotic Product (PROBIOTIC BLEND PO) daily.   propranolol (INDERAL) 20 MG tablet Take 1 tablet (20 mg total) by mouth 3 (three) times daily as needed (anxiety).   tirzepatide (MOUNJARO) 7.5 MG/0.5ML Pen Inject 7.5 mg into the skin once a week.   traZODone (DESYREL) 100 MG tablet Take 1 tablet (100 mg total) by mouth at bedtime.   Vitamin D, Ergocalciferol, (DRISDOL) 1.25 MG (50000 UNIT) CAPS capsule TAKE 1 CAPSULE BY MOUTH 2 TIMES A WEEK     Family History  Problem Relation Age of Onset   Hypertension Mother    Hyperlipidemia Mother    Diabetes Mother         pre-diabetes   Allergic rhinitis Mother    Asthma Mother    Urticaria Mother  Hypertension Father    Hyperlipidemia Father    Heart disease Father    Diabetes Father    Cancer Father        skin   Allergic rhinitis Father    Asthma Father    Urticaria Father    Diabetes Sister    Hypertension Sister    Allergic rhinitis Sister    Asthma Sister    Urticaria Sister    Pleurisy Son    Migraines Son    Heart disease Maternal Grandmother        V. Fib arrest   Cancer Maternal Grandfather        lung and skin   Colon cancer Maternal Grandfather    Cancer Paternal Grandfather        small bowel cancer and skin   Colon polyps Other        aunt and uncle   Eczema Neg Hx     Social History   Socioeconomic History   Marital status: Married    Spouse name: Luisa Hartatrick   Number of children: 3   Years of education: Not on file   Highest education level: Not on file  Occupational History   Occupation: school counselor  Tobacco Use   Smoking status: Never   Smokeless tobacco: Never  Vaping Use   Vaping Use: Never used  Substance and Sexual Activity   Alcohol use: No   Drug use: No   Sexual activity: Yes  Other Topics Concern   Not on file  Social History Narrative   Lives with husband married 18 years Dec 2021      Oldest is 4223- son lives in [place]   12 daughter and 3617 son year olds in home      Cat: Snickers       Enjoy: cooking some, puzzles, reading       Diet: eats all food groups outside allergies    Caffeine: 2 cans of diet soda daily and some tea   Water: 3 24 oz containers daily       Wears seat belt    Does not use phone while driving    Designer, multimediamoke detectors    Fire extinguishers   Weapons -none          Social Determinants of Health   Financial Resource Strain: Not on file  Food Insecurity: Not on file  Transportation Needs: Not on file  Physical Activity: Not on file  Stress: Not on file  Social Connections: Not on file       Review of  Systems: Gen: Denies fever, chills, cold or flulike symptoms, presyncope, syncope. CV: Denies chest pain, palpitations. Resp: Denies dyspnea, cough.  GI: see HPI Derm: Denies rash. Psych: Denies depression, anxiety.  Heme: See HPI  Observations/Objective: No distress. Alert and oriented. Pleasant. Well nourished. Normal mood and affect. Patient reports tenderness when palpating her RLQ today. Unable to perform complete physical exam due to video encounter.    Assessment:  46 year old female with history of fibromyalgia, bilateral ovarian cyst, obesity, lap band in 2011 but developed complication of slippage and herniation of entire gastric pouch above the band causing obstructive symptoms in 2019 s/p gastric band removal, IDA found at the time of gastric band complication s/p complete GI evaluation revealing Sheria Langameron lesions/antral, bulbar erosions/3 areas of ulceration in proximal jejunum, and required IV iron for short time, but hemoglobin and iron normalized in 2020.  She is presenting today with chief complaint of right lower quadrant abdominal  pain and also asking about known hiatal hernia.  RLQ abdominal pain: Couple year history of intermittent, fairly infrequent, mild RLQ abdominal discomfort.  However, symptoms have been worsening over the last couple of months with increasing frequency and severity, now occurring at least 3-4 times a week starting during or after a bowel movement.  Reports intense pain doubling her over for about 5 minutes, but then constant, lingering pain for a couple of days.  No association with meals until yesterday.  Denies nausea, vomiting, fever, constipation, diarrhea, abnormal vaginal discharge, or urinary symptoms.  Reports single episode of rectal bleeding last week when she straining to pass a large stool, but no recurrent symptoms, and stools are soft, formed, and complete. Reports tenderness to palpation of her right lower quadrant today.  Appendix in situ.   Known right adnexal cyst.  Somewhat difficult to assess over video visit today, but with increasing frequency and intensity of pain, I feel patient needs a CT for further evaluation with differentials including developing appendicitis, complication related to adnexal cyst, and less likely diverticulitis. Last colonoscopy was in 2020 with normal exam.    Rectal bleeding: Single episode of low-volume rectal bleeding last week with bright red blood on toilet tissue and on the very end of her stool in the setting of straining to pass a larger bowel movement.  No recurrent symptoms and reports typically she has no trouble with constipation.  Last colonoscopy in 2020 with normal exam.  Discussed repeating a colonoscopy due to rectal bleeding, but patient prefers to monitor for now.  She has any recurrent bleeding, we will need to consider early interval colonoscopy.  Suspect the single episode was likely from benign anorectal source.  Hiatal hernia: Known history of large hiatal hernia.  Currently asymptomatic.  Noted couple of Sheria Lang lesions in 2020 on EGD, but she denies BRBPR, melena, heartburn, upper abdominal pain, dysphagia, and hemoglobin is within normal limits.  Patient was asking if she needed surgery.  Advised that she can hold off on surgery if she would like unless she were to develop complications, and prefers to go this route.    Plan: CT A/P with contrast ASAP May use tylenol as needed.  Advised to proceed to the ED if she were to develop nausea, vomiting, severe persistent pain, fever. Further recommendations to follow CT.      I discussed the assessment and treatment plan with the patient. The patient was provided an opportunity to ask questions and all were answered. The patient agreed with the plan and demonstrated an understanding of the instructions.   The patient was advised to call back or seek an in-person evaluation if the symptoms worsen or if the condition fails to improve  as anticipated.  I provided 30 minutes of video-face-to-face time during this encounter.  Ermalinda Memos, PA-C [facility]  05/11/2022

## 2022-05-10 ENCOUNTER — Encounter (HOSPITAL_COMMUNITY): Payer: BC Managed Care – PPO

## 2022-05-11 ENCOUNTER — Telehealth (INDEPENDENT_AMBULATORY_CARE_PROVIDER_SITE_OTHER): Payer: BC Managed Care – PPO | Admitting: Gastroenterology

## 2022-05-11 ENCOUNTER — Encounter: Payer: Self-pay | Admitting: Gastroenterology

## 2022-05-11 ENCOUNTER — Ambulatory Visit (HOSPITAL_COMMUNITY)
Admission: RE | Admit: 2022-05-11 | Discharge: 2022-05-11 | Disposition: A | Discharge status: Home / Self Care | Payer: BC Managed Care – PPO | Source: Ambulatory Visit | Attending: Gastroenterology | Admitting: Gastroenterology

## 2022-05-11 ENCOUNTER — Telehealth: Payer: Self-pay | Admitting: Gastroenterology

## 2022-05-11 VITALS — Ht 64.5 in | Wt 304.0 lb

## 2022-05-11 DIAGNOSIS — K449 Diaphragmatic hernia without obstruction or gangrene: Secondary | ICD-10-CM | POA: Diagnosis not present

## 2022-05-11 DIAGNOSIS — K625 Hemorrhage of anus and rectum: Secondary | ICD-10-CM | POA: Diagnosis not present

## 2022-05-11 DIAGNOSIS — R1031 Right lower quadrant pain: Secondary | ICD-10-CM | POA: Diagnosis not present

## 2022-05-11 MED ORDER — IOHEXOL 300 MG/ML  SOLN
100.0000 mL | Freq: Once | INTRAMUSCULAR | Status: AC | PRN
  Administered 2022-05-11: 100 mL via INTRAVENOUS

## 2022-05-11 MED ORDER — IOHEXOL 9 MG/ML PO SOLN
ORAL | Status: AC
  Filled 2022-05-11: qty 1000

## 2022-05-11 NOTE — Telephone Encounter (Signed)
PA approved via CARELON. Order ID: [id number]       Approval Valid Through: 05/11/2022 - 06/09/2022  Called CS and patient can head over now. Called pt and she will do. Nothing further needed

## 2022-05-11 NOTE — Telephone Encounter (Signed)
[name], patient needs CT A/P with contrast ASAP to evaluate RLQ abdominal pain. I have placed the order. Please arrange.

## 2022-05-11 NOTE — Patient Instructions (Signed)
We will arrange for you to have a CT scan of your abdomen to further evaluate the pain you are experiencing in your right lower side.   You may use tylenol as needed for now.   If you develop nausea, vomiting, severe persistent pain, fever, you need to proceed to the emergency room.   We will have further recommendations to follow.   It was nice meeting you today!   Ermalinda Memos, PA-C [facility]

## 2022-05-30 ENCOUNTER — Other Ambulatory Visit: Payer: Self-pay | Admitting: Gastroenterology

## 2022-05-30 ENCOUNTER — Encounter: Payer: Self-pay | Admitting: *Deleted

## 2022-05-30 DIAGNOSIS — R1031 Right lower quadrant pain: Secondary | ICD-10-CM

## 2022-05-30 MED ORDER — NA SULFATE-K SULFATE-MG SULF 17.5-3.13-1.6 GM/177ML PO SOLN: ORAL | 0 refills | Status: DC

## 2022-06-01 ENCOUNTER — Ambulatory Visit (HOSPITAL_COMMUNITY)
Admission: RE | Admit: 2022-06-01 | Discharge: 2022-06-01 | Disposition: A | Discharge status: Home / Self Care | Payer: BC Managed Care – PPO | Source: Ambulatory Visit | Attending: Gastroenterology | Admitting: Gastroenterology

## 2022-06-01 DIAGNOSIS — R1031 Right lower quadrant pain: Secondary | ICD-10-CM | POA: Insufficient documentation

## 2022-06-06 ENCOUNTER — Encounter (INDEPENDENT_AMBULATORY_CARE_PROVIDER_SITE_OTHER): Payer: Self-pay

## 2022-06-14 ENCOUNTER — Encounter: Payer: Self-pay | Admitting: Internal Medicine

## 2022-06-19 ENCOUNTER — Other Ambulatory Visit: Payer: Self-pay

## 2022-06-19 DIAGNOSIS — E559 Vitamin D deficiency, unspecified: Secondary | ICD-10-CM

## 2022-06-19 NOTE — Telephone Encounter (Signed)
Previous [name] patient. Patient last seen in March and has an appointment in September.

## 2022-06-20 NOTE — Telephone Encounter (Signed)
We;ll check labs when she comes in and see if she needs the big dose.

## 2022-06-25 NOTE — Patient Instructions (Signed)
[name]  06/25/2022     @PREFPERIOPPHARMACY @   Your procedure is scheduled on  06/28/2022.   Report to [facility] at  0900  A.M.   Call this number if you have problems the morning of surgery:  [phone]   Remember:  Follow the diet and prep instructions given to you by the office.      Use your inhaler before you come and bring your rescue inhaler with you.     Take these medicines the morning of surgery with A SIP OF WATER        buspar, fioricet(if needed), cymbalta, neurontin, inderal.     Do not wear jewelry, make-up or nail polish.  Do not wear lotions, powders, or perfumes, or deodorant.  Do not shave 48 hours prior to surgery.  Men may shave face and neck.  Do not bring valuables to the hospital.  [facility] is not responsible for any belongings or valuables.  Contacts, dentures or bridgework may not be worn into surgery.  Leave your suitcase in the car.  After surgery it may be brought to your room.  For patients admitted to the hospital, discharge time will be determined by your treatment team.  Patients discharged the day of surgery will not be allowed to drive home and must have someone with them for 24 hours.    Special instructions:   DO NOT smoke tobacco or vape for 24 hours before your procedure.  Please read over the following fact sheets that you were given. Anesthesia Post-op Instructions and Care and Recovery After Surgery      Upper Endoscopy, Adult, Care After After the procedure, it is common to have a sore throat. It is also common to have: Mild stomach pain or discomfort. Bloating. Nausea. Follow these instructions at home: The instructions below may help you care for yourself at home. Your health care provider may give you more instructions. If you have questions, ask your health care provider. If you were given a sedative during the procedure, it can affect you for several hours. Do not drive or operate machinery  until your health care provider says that it is safe. If you will be going home right after the procedure, plan to have a responsible adult: Take you home from the hospital or clinic. You will not be allowed to drive. Care for you for the time you are told. Follow instructions from your health care provider about what you may eat and drink. Return to your normal activities as told by your health care provider. Ask your health care provider what activities are safe for you. Take over-the-counter and prescription medicines only as told by your health care provider. Contact a health care provider if you: Have a sore throat that lasts longer than one day. Have trouble swallowing. Have a fever. Get help right away if you: Vomit blood or your vomit looks like coffee grounds. Have bloody, black, or tarry stools. Have a very bad sore throat or you cannot swallow. Have difficulty breathing or very bad pain in your chest or abdomen. These symptoms may be an emergency. Get help right away. Call 911. Do not wait to see if the symptoms will go away. Do not drive yourself to the hospital. Summary After the procedure, it is common to have a sore throat, mild stomach discomfort, bloating, and nausea. If you were given a sedative during the procedure, it can affect you for several  hours. Do not drive until your health care provider says that it is safe. Follow instructions from your health care provider about what you may eat and drink. Return to your normal activities as told by your health care provider. This information is not intended to replace advice given to you by your health care provider. Make sure you discuss any questions you have with your health care provider. Document Revised: 01/24/2022 Document Reviewed: 01/24/2022 Elsevier Patient Education  2023 [employer] Colonoscopy, Adult, Care After The following information offers guidance on how to care for yourself after your procedure. Your  health care provider may also give you more specific instructions. If you have problems or questions, contact your health care provider. What can I expect after the procedure? After the procedure, it is common to have: A small amount of blood in your stool for 24 hours after the procedure. Some gas. Mild cramping or bloating of your abdomen. Follow these instructions at home: Eating and drinking  Drink enough fluid to keep your urine pale yellow. Follow instructions from your health care provider about eating or drinking restrictions. Resume your normal diet as told by your health care provider. Avoid heavy or fried foods that are hard to digest. Activity Rest as told by your health care provider. Avoid sitting for a long time without moving. Get up to take short walks every 1-2 hours. This is important to improve blood flow and breathing. Ask for help if you feel weak or unsteady. Return to your normal activities as told by your health care provider. Ask your health care provider what activities are safe for you. Managing cramping and bloating  Try walking around when you have cramps or feel bloated. If directed, apply heat to your abdomen as told by your health care provider. Use the heat source that your health care provider recommends, such as a moist heat pack or a heating pad. Place a towel between your skin and the heat source. Leave the heat on for 20-30 minutes. Remove the heat if your skin turns bright red. This is especially important if you are unable to feel pain, heat, or cold. You have a greater risk of getting burned. General instructions If you were given a sedative during the procedure, it can affect you for several hours. Do not drive or operate machinery until your health care provider says that it is safe. For the first 24 hours after the procedure: Do not sign important documents. Do not drink alcohol. Do your regular daily activities at a slower pace than  normal. Eat soft foods that are easy to digest. Take over-the-counter and prescription medicines only as told by your health care provider. Keep all follow-up visits. This is important. Contact a health care provider if: You have blood in your stool 2-3 days after the procedure. Get help right away if: You have more than a small spotting of blood in your stool. You have large blood clots in your stool. You have swelling of your abdomen. You have nausea or vomiting. You have a fever. You have increasing pain in your abdomen that is not relieved with medicine. These symptoms may be an emergency. Get help right away. Call 911. Do not wait to see if the symptoms will go away. Do not drive yourself to the hospital. Summary After the procedure, it is common to have a small amount of blood in your stool. You may also have mild cramping and bloating of your abdomen. If you were given a  sedative during the procedure, it can affect you for several hours. Do not drive or operate machinery until your health care provider says that it is safe. Get help right away if you have a lot of blood in your stool, nausea or vomiting, a fever, or increased pain in your abdomen. This information is not intended to replace advice given to you by your health care provider. Make sure you discuss any questions you have with your health care provider. Document Revised: 06/07/2021 Document Reviewed: 06/07/2021 Elsevier Patient Education  2023 [employer]. Monitored Anesthesia Care, Care After This sheet gives you information about how to care for yourself after your procedure. Your health care provider may also give you more specific instructions. If you have problems or questions, contact your health care provider. What can I expect after the procedure? After the procedure, it is common to have: Tiredness. Forgetfulness about what happened after the procedure. Impaired judgment for important decisions. Nausea or  vomiting. Some difficulty with balance. Follow these instructions at home: For the time period you were told by your health care provider:     Rest as needed. Do not participate in activities where you could fall or become injured. Do not drive or use machinery. Do not drink alcohol. Do not take sleeping pills or medicines that cause drowsiness. Do not make important decisions or sign legal documents. Do not take care of children on your own. Eating and drinking Follow the diet that is recommended by your health care provider. Drink enough fluid to keep your urine pale yellow. If you vomit: Drink water, juice, or soup when you can drink without vomiting. Make sure you have little or no nausea before eating solid foods. General instructions Have a responsible adult stay with you for the time you are told. It is important to have someone help care for you until you are awake and alert. Take over-the-counter and prescription medicines only as told by your health care provider. If you have sleep apnea, surgery and certain medicines can increase your risk for breathing problems. Follow instructions from your health care provider about wearing your sleep device: Anytime you are sleeping, including during daytime naps. While taking prescription pain medicines, sleeping medicines, or medicines that make you drowsy. Avoid smoking. Keep all follow-up visits as told by your health care provider. This is important. Contact a health care provider if: You keep feeling nauseous or you keep vomiting. You feel light-headed. You are still sleepy or having trouble with balance after 24 hours. You develop a rash. You have a fever. You have redness or swelling around the IV site. Get help right away if: You have trouble breathing. You have new-onset confusion at home. Summary For several hours after your procedure, you may feel tired. You may also be forgetful and have poor judgment. Have a  responsible adult stay with you for the time you are told. It is important to have someone help care for you until you are awake and alert. Rest as told. Do not drive or operate machinery. Do not drink alcohol or take sleeping pills. Get help right away if you have trouble breathing, or if you suddenly become confused. This information is not intended to replace advice given to you by your health care provider. Make sure you discuss any questions you have with your health care provider. Document Revised: 09/19/2021 Document Reviewed: 09/17/2019 Elsevier Patient Education  2023 [employer].

## 2022-06-26 ENCOUNTER — Encounter (HOSPITAL_COMMUNITY)
Admission: RE | Admit: 2022-06-26 | Discharge: 2022-06-26 | Disposition: A | Discharge status: Home / Self Care | Payer: BC Managed Care – PPO | Source: Ambulatory Visit | Attending: Internal Medicine | Admitting: Internal Medicine

## 2022-06-26 ENCOUNTER — Other Ambulatory Visit: Payer: Self-pay

## 2022-06-26 ENCOUNTER — Encounter (HOSPITAL_COMMUNITY): Payer: Self-pay

## 2022-06-26 VITALS — BP 126/57 | HR 62 | Temp 97.8°F | Resp 18 | Ht 64.5 in | Wt 304.0 lb

## 2022-06-26 DIAGNOSIS — K219 Gastro-esophageal reflux disease without esophagitis: Secondary | ICD-10-CM | POA: Diagnosis not present

## 2022-06-26 DIAGNOSIS — F418 Other specified anxiety disorders: Secondary | ICD-10-CM | POA: Diagnosis not present

## 2022-06-26 DIAGNOSIS — K641 Second degree hemorrhoids: Secondary | ICD-10-CM | POA: Diagnosis not present

## 2022-06-26 DIAGNOSIS — G4730 Sleep apnea, unspecified: Secondary | ICD-10-CM | POA: Diagnosis not present

## 2022-06-26 DIAGNOSIS — D508 Other iron deficiency anemias: Secondary | ICD-10-CM

## 2022-06-26 DIAGNOSIS — R7303 Prediabetes: Secondary | ICD-10-CM | POA: Insufficient documentation

## 2022-06-26 DIAGNOSIS — J45909 Unspecified asthma, uncomplicated: Secondary | ICD-10-CM | POA: Diagnosis not present

## 2022-06-26 DIAGNOSIS — K644 Residual hemorrhoidal skin tags: Secondary | ICD-10-CM | POA: Diagnosis not present

## 2022-06-26 DIAGNOSIS — Z6841 Body Mass Index (BMI) 40.0 and over, adult: Secondary | ICD-10-CM | POA: Diagnosis not present

## 2022-06-26 DIAGNOSIS — Z01812 Encounter for preprocedural laboratory examination: Secondary | ICD-10-CM | POA: Insufficient documentation

## 2022-06-26 DIAGNOSIS — K449 Diaphragmatic hernia without obstruction or gangrene: Secondary | ICD-10-CM | POA: Diagnosis not present

## 2022-06-26 DIAGNOSIS — K921 Melena: Secondary | ICD-10-CM | POA: Diagnosis not present

## 2022-06-26 DIAGNOSIS — D509 Iron deficiency anemia, unspecified: Secondary | ICD-10-CM | POA: Diagnosis not present

## 2022-06-26 LAB — CBC WITH DIFFERENTIAL/PLATELET
Abs Immature Granulocytes: 0.04 10*3/uL (ref 0.00–0.07)
Basophils Absolute: 0.1 10*3/uL (ref 0.0–0.1)
Basophils Relative: 1 %
Eosinophils Absolute: 0.6 10*3/uL — ABNORMAL HIGH (ref 0.0–0.5)
Eosinophils Relative: 8 %
HCT: 40.1 % (ref 36.0–46.0)
Hemoglobin: 13.6 g/dL (ref 12.0–15.0)
Immature Granulocytes: 1 %
Lymphocytes Relative: 30 %
Lymphs Abs: 2.4 10*3/uL (ref 0.7–4.0)
MCH: 31.3 pg (ref 26.0–34.0)
MCHC: 33.9 g/dL (ref 30.0–36.0)
MCV: 92.4 fL (ref 80.0–100.0)
Monocytes Absolute: 0.6 10*3/uL (ref 0.1–1.0)
Monocytes Relative: 8 %
Neutro Abs: 4.2 10*3/uL (ref 1.7–7.7)
Neutrophils Relative %: 52 %
Platelets: 294 10*3/uL (ref 150–400)
RBC: 4.34 MIL/uL (ref 3.87–5.11)
RDW: 14.1 % (ref 11.5–15.5)
WBC: 7.9 10*3/uL (ref 4.0–10.5)
nRBC: 0 % (ref 0.0–0.2)

## 2022-06-26 LAB — BASIC METABOLIC PANEL
Anion gap: 10 (ref 5–15)
BUN: 10 mg/dL (ref 6–20)
CO2: 24 mmol/L (ref 22–32)
Calcium: 8.9 mg/dL (ref 8.9–10.3)
Chloride: 103 mmol/L (ref 98–111)
Creatinine, Ser: 1.03 mg/dL — ABNORMAL HIGH (ref 0.44–1.00)
GFR, Estimated: >60 mL/min (ref >60)
Glucose, Bld: 97 mg/dL (ref 70–99)
Potassium: 3.8 mmol/L (ref 3.5–5.1)
Sodium: 137 mmol/L (ref 135–145)

## 2022-06-28 ENCOUNTER — Ambulatory Visit (HOSPITAL_COMMUNITY)
Admission: RE | Admit: 2022-06-28 | Discharge: 2022-06-28 | Disposition: A | Discharge status: Home / Self Care | Payer: BC Managed Care – PPO | Attending: Internal Medicine | Admitting: Internal Medicine

## 2022-06-28 ENCOUNTER — Encounter (HOSPITAL_COMMUNITY): Payer: Self-pay | Admitting: Internal Medicine

## 2022-06-28 ENCOUNTER — Ambulatory Visit (HOSPITAL_COMMUNITY): Payer: BC Managed Care – PPO | Admitting: Certified Registered Nurse Anesthetist

## 2022-06-28 ENCOUNTER — Encounter (HOSPITAL_COMMUNITY)
Admission: RE | Disposition: A | Discharge status: Home / Self Care | Payer: Self-pay | Source: Home / Self Care | Attending: Internal Medicine

## 2022-06-28 DIAGNOSIS — K644 Residual hemorrhoidal skin tags: Secondary | ICD-10-CM | POA: Insufficient documentation

## 2022-06-28 DIAGNOSIS — K219 Gastro-esophageal reflux disease without esophagitis: Secondary | ICD-10-CM | POA: Insufficient documentation

## 2022-06-28 DIAGNOSIS — G4730 Sleep apnea, unspecified: Secondary | ICD-10-CM | POA: Insufficient documentation

## 2022-06-28 DIAGNOSIS — Z6841 Body Mass Index (BMI) 40.0 and over, adult: Secondary | ICD-10-CM | POA: Insufficient documentation

## 2022-06-28 DIAGNOSIS — K921 Melena: Secondary | ICD-10-CM | POA: Diagnosis not present

## 2022-06-28 DIAGNOSIS — K641 Second degree hemorrhoids: Secondary | ICD-10-CM

## 2022-06-28 DIAGNOSIS — K449 Diaphragmatic hernia without obstruction or gangrene: Secondary | ICD-10-CM | POA: Insufficient documentation

## 2022-06-28 DIAGNOSIS — F418 Other specified anxiety disorders: Secondary | ICD-10-CM | POA: Insufficient documentation

## 2022-06-28 DIAGNOSIS — J45909 Unspecified asthma, uncomplicated: Secondary | ICD-10-CM | POA: Insufficient documentation

## 2022-06-28 DIAGNOSIS — D509 Iron deficiency anemia, unspecified: Secondary | ICD-10-CM | POA: Insufficient documentation

## 2022-06-28 HISTORY — PX: COLONOSCOPY WITH PROPOFOL: SHX5780

## 2022-06-28 HISTORY — PX: ESOPHAGOGASTRODUODENOSCOPY (EGD) WITH PROPOFOL: SHX5813

## 2022-06-28 HISTORY — PX: BIOPSY: SHX5522

## 2022-06-28 SURGERY — COLONOSCOPY WITH PROPOFOL
Anesthesia: General

## 2022-06-28 MED ORDER — PROPOFOL 500 MG/50ML IV EMUL
INTRAVENOUS | Status: DC | PRN
  Administered 2022-06-28: 200 ug/kg/min via INTRAVENOUS

## 2022-06-28 MED ORDER — PROPOFOL 10 MG/ML IV BOLUS
INTRAVENOUS | Status: DC | PRN
  Administered 2022-06-28: 20 mg via INTRAVENOUS
  Administered 2022-06-28: 80 mg via INTRAVENOUS

## 2022-06-28 MED ORDER — LACTATED RINGERS IV SOLN: INTRAVENOUS | Status: DC

## 2022-06-28 MED ORDER — LIDOCAINE HCL (CARDIAC) PF 100 MG/5ML IV SOSY
PREFILLED_SYRINGE | INTRAVENOUS | Status: DC | PRN
  Administered 2022-06-28: 50 mg via INTRAVENOUS

## 2022-06-28 NOTE — Op Note (Addendum)
[facility] Patient Name: [name] Procedure Date: 06/28/2022 9:43 AM MRN: [id number] Date of Birth: [birth year] Attending MD: Norvel Richards , MD CSN: [id number] Age: 46 Admit Type: Outpatient Procedure:                Upper GI endoscopy Indications:              Abnormal stomach on CT Providers:                Norvel Richards, MD, Crystal Page, Rosina Lowenstein, RN Referring MD:              Medicines:                Propofol per Anesthesia Complications:            No immediate complications. Estimated Blood Loss:     Estimated blood loss was minimal. Procedure:                Pre-Anesthesia Assessment:                           - Prior to the procedure, a History and Physical                            was performed, and patient medications and                            allergies were reviewed. The patient's tolerance of                            previous anesthesia was also reviewed. The risks                            and benefits of the procedure and the sedation                            options and risks were discussed with the patient.                            All questions were answered, and informed consent                            was obtained. Prior Anticoagulants: The patient has                            taken no previous anticoagulant or antiplatelet                            agents. ASA Grade Assessment: III - A patient with                            severe systemic disease. After reviewing the risks  and benefits, the patient was deemed in                            satisfactory condition to undergo the procedure.                           After obtaining informed consent, the endoscope was                            passed under direct vision. Throughout the                            procedure, the patient's blood pressure, pulse, and                            oxygen saturations  were monitored continuously. The                            [id number]-[id number] ([id number]) scope was introduced through the                            mouth, and advanced to the third part of duodenum.                            The upper GI endoscopy was accomplished without                            difficulty. The patient tolerated the procedure                            well. The upper GI endoscopy was accomplished                            without difficulty. Scope In: 10:16:09 AM Scope Out: 10:21:06 AM Total Procedure Duration: 0 hours 4 minutes 57 seconds  Findings:      The examined esophagus was normal. Large hiatal hernia perhaps a third       of the stomach above the diaphragm. Couple of erosions overlying gastric       mucosa straddling the diaphragmatic hiatus. No ulcer or infiltrating       process seen remainder of gastric mucosa. Normal patent pylorus.       Examination of bulb second and third portion of duodenum revealed no       abnormalities.      Biopsies of the second third portion of duodenum taken for histologic       study Impression:               - Normal esophagus. Large hiatal hernia with mild                            Sheria Lang lesions (may explain history of iron                            deficiency anemia)  Normal duodenum?status post biopsy Moderate Sedation:      Moderate (conscious) sedation was personally administered by an       anesthesia professional. The following parameters were monitored: oxygen       saturation, heart rate, blood pressure, respiratory rate, EKG, adequacy       of pulmonary ventilation, and response to care. Recommendation:           - Patient has a contact number available for                            emergencies. The signs and symptoms of potential                            delayed complications were discussed with the                            patient. Return to normal activities tomorrow.                             Written discharge instructions were provided to the                            patient. Follow-up on pathology. See colonoscopy                            report. Procedure Code(s):        --- Professional ---                           [phone], Esophagogastroduodenoscopy, flexible,                            transoral; diagnostic, including collection of                            specimen(s) by brushing or washing, when performed                            (separate procedure) Diagnosis Code(s):        --- Professional ---                           K92.1, Melena (includes Hematochezia) CPT copyright 2019 American Medical Association. All rights reserved. The codes documented in this report are preliminary and upon coder review may  be revised to meet current compliance requirements. [name]. Caisley Baxendale, MD [name], MD 06/28/2022 10:40:49 AM This report has been signed electronically. Number of Addenda: 0

## 2022-06-28 NOTE — Op Note (Signed)
[facility] Patient Name: [name] Procedure Date: 06/28/2022 9:44 AM MRN: [id number] Date of Birth: 1976-04-10 Attending MD: [name] , MD CSN: [id number] Age: 46 Admit Type: Outpatient Procedure:                Colonoscopy Indications:              Hematochezia Providers:                [name], MD, Crystal Page, Edrick Kins, RN Referring MD:              Medicines:                Propofol per Anesthesia Complications:            No immediate complications. Estimated Blood Loss:     Estimated blood loss: none. Procedure:                Pre-Anesthesia Assessment:                           - Prior to the procedure, a History and Physical                            was performed, and patient medications and                            allergies were reviewed. The patient's tolerance of                            previous anesthesia was also reviewed. The risks                            and benefits of the procedure and the sedation                            options and risks were discussed with the patient.                            All questions were answered, and informed consent                            was obtained. Prior Anticoagulants: The patient has                            taken no previous anticoagulant or antiplatelet                            agents. ASA Grade Assessment: III - A patient with                            severe systemic disease. After reviewing the risks                            and benefits,  the patient was deemed in                            satisfactory condition to undergo the procedure.                           After obtaining informed consent, the colonoscope                            was passed under direct vision. Throughout the                            procedure, the patient's blood pressure, pulse, and                            oxygen saturations were monitored  continuously. The                            [id number]) scope was introduced through the                            anus and advanced to the the cecum, identified by                            appendiceal orifice and ileocecal valve. The                            colonoscopy was performed without difficulty. The                            patient tolerated the procedure well. The quality                            of the bowel preparation was adequate. Scope In: 10:26:34 AM Scope Out: 10:36:40 AM Scope Withdrawal Time: 0 hours 6 minutes 17 seconds  Total Procedure Duration: 0 hours 10 minutes 6 seconds  Findings:      The perianal and digital rectal examinations were normal. Elongated and       redundant colon. Colonic mucosa however, appeared normal. Acute       angulation of the ileocecal valve precluded intubation of the terminal       ileum.      Non-bleeding external and internal hemorrhoids were found during       retroflexion. The hemorrhoids were mild, medium-sized and Grade II       (internal hemorrhoids that prolapse but reduce spontaneously).      The exam was otherwise without abnormality on direct and retroflexion       views. Impression:               - Non-bleeding external and internal hemorrhoids.                           - The examination was otherwise normal on direct  and retroflexion views.                           - No specimens collected. Moderate Sedation:      Moderate (conscious) sedation was personally administered by an       anesthesia professional. The following parameters were monitored: oxygen       saturation, heart rate, blood pressure, respiratory rate, EKG, adequacy       of pulmonary ventilation, and response to care. Recommendation:           - Patient has a contact number available for                            emergencies. The signs and symptoms of potential                            delayed complications  were discussed with the                            patient. Return to normal activities tomorrow.                            Written discharge instructions were provided to the                            patient.                           - Advance diet as tolerated.                           - Continue present medications.                           - Repeat colonoscopy in 10 years for screening                            purposes.                           - Return to GI office in 12 weeks. Trial of Linzess                            290 1 capsule daily x3 weeks. Office visit in 3                            months. See EGD report. Procedure Code(s):        --- Professional ---                           [phone], Colonoscopy, flexible; diagnostic, including                            collection of specimen(s) by brushing or washing,  when performed (separate procedure) Diagnosis Code(s):        --- Professional ---                           K64.1, Second degree hemorrhoids                           K92.1, Melena (includes Hematochezia) CPT copyright 2019 American Medical Association. All rights reserved. The codes documented in this report are preliminary and upon coder review may  be revised to meet current compliance requirements. [name]. Kami Kube, MD [name], MD 06/28/2022 11:15:37 AM This report has been signed electronically. Number of Addenda: 0

## 2022-06-28 NOTE — H&P (Signed)
@LOGO @   Primary Care Physician:  , MD Primary Gastroenterologist:  Dr. [name]  Pre-Procedure History & Physical: HPI:  [name] is a 46 y.o. female here for further evaluation of right-sided abdominal pain thickening of gastric folds on CT.  Intermittent rectal bleeding.  No dysphagia.  EGD and colonoscopy today.  After evacuating, patient plain films revealed a large amount of stool remaining in her colon.  Her cecum is high riding. Billable hernia containing only fat large hiatal hernia with a proximal third of her stomach in her chest.  History of failed gastric band.  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   Generalized headaches    GERD (gastroesophageal reflux disease)    H/O laparoscopic adjustable gastric banding 07/29/2013   10/17/10    History of removal of laparoscopic gastric banding device 02/03/2018   Irregular menses    Joint pain    Lactose intolerance    Lower extremity edema    Migraines    Muscle pain    Obesity (BMI 30-39.9)    Osteoarthritis    Other allergic rhinitis 05/24/2020   Ovarian retention cyst 11/13/2012   Sleep apnea    SOB (shortness of breath)    Stomach ulcer    Swallowing difficulty    Vision problems    Vitamin D deficiency     Past Surgical History:  Procedure Laterality Date    ABDOMINAL HYSTERECTOMY     ACHILLES TENDON LENGTHENING Bilateral 1989   ANKLE GANGLION CYST EXCISION  02/2012   left ankle   ANTERIOR LAT LUMBAR FUSION Right 05/11/2019   Procedure: ANTERIOR LATERAL LUMBAR INTERBODY FUSION, LATERAL INSTRUMENTATION, RIGHT LUMBAR TWO- LUMBAR THREE;  Surgeon: 05/13/2019, MD;  Location: [facility] OR;  Service: Neurosurgery;  Laterality: Right;  ANTERIOR LATERAL LUMBAR INTERBODY FUSION, LATERAL INSTRUMENTATION, RIGHT LUMBAR TWO- LUMBAR THREE   BIOPSY  03/16/2019   Procedure: BIOPSY;  Surgeon: 03/18/2019, MD;  Location: [facility];  Service: Endoscopy;;  Gastric    COLONOSCOPY WITH PROPOFOL N/A 03/16/2019   Dr. 03/18/2019: Normal terminal ileum, 15 cm.  Normal colon.  Next colonoscopy 10 years   DILATION AND CURETTAGE OF UTERUS  2003   ENTEROSCOPY N/A 04/02/2019   Dr. 06/02/2019: bulbar erosions, jejunal ulcerations, attempted biopsy but scope fell backwards and lesions cannot be reidentified.  No evidence of ampullary lesion or mass.   ESOPHAGOGASTRODUODENOSCOPY (EGD) WITH PROPOFOL N/A 03/16/2019   Dr. 03/18/2019: Large hiatal hernia, Cameron/antral erosions, duodenal erosions.  Gastric biopsy showed reactive gastropathy, mild chronic gastritis, negative for H. pylori   FEET SURGERY Bilateral 1990   INSERTION OF BONE GRAFT IN FEET   GIVENS CAPSULE STUDY N/A 03/16/2019   Procedure: GIVENS CAPSULE STUDY;  Surgeon: 03/18/2019,  [name], MD;  Location: [facility];  Service: Endoscopy;  Laterality: N/A;   LAPAROSCOPIC GASTRIC BANDING  10/17/2010   Dr Gaynelle Adu; [facility]   LAPAROSCOPIC REPAIR AND REMOVAL OF GASTRIC BAND  01/2018   LIPOMA EXCISION  02/2006   abdomen   LUMBAR LAMINECTOMY     SPINE SURGERY N/A    [employer] 11/10/2020   TRANSTHORACIC ECHOCARDIOGRAM  11/2021   Normal LV size and function.  EF 60 to 65%.  Mild LVH.  GR 1 DD.  No MR.  Normal LA, RA size.  Normal RV size and function.  Unable to assess RVP.  Normal estimated RAP.  Normal valves.    Prior to  Admission medications   Medication Sig Start Date End Date Taking? Authorizing Provider  albuterol (VENTOLIN HFA) 108 (90 Base) MCG/ACT inhaler Inhale 2 puffs into the lungs every 6 (six) hours as needed for wheezing.   Yes [provider]  aspirin EC 81 MG tablet Take 1 tablet (81 mg total) by mouth daily. Swallow whole. Patient taking differently: Take 81 mg by mouth every other day. Swallow whole. 05/03/22  Yes Marykay Lex, MD  atorvastatin (LIPITOR) 40 MG tablet Take 1 tablet (40 mg total) by mouth daily. Patient taking differently: Take 40 mg by mouth every evening. 01/03/22  Yes [name], [name], MD  busPIRone (BUSPAR) 10 MG tablet Take 1 tablet (10 mg total) by mouth 3 (three) times daily. Patient taking differently: Take 10 mg by mouth 3 (three) times daily as needed (anxiety). 07/26/21  Yes Helane Rima, DO  butalbital-acetaminophen-caffeine (FIORICET) 50-325-40 MG tablet Take 1 tablet by mouth every 6 (six) hours as needed for headache. 01/03/22  Yes [name], [name], MD  DULoxetine (CYMBALTA) 60 MG capsule Take 1 capsule (60 mg total) by mouth 2 (two) times daily. 09/11/21  Yes Helane Rima, DO  EPINEPHrine 0.3 mg/0.3 mL IJ SOAJ injection Inject 0.3 mg into the muscle as needed for anaphylaxis.   Yes [provider]  furosemide (LASIX) 20 MG tablet Take 20 mg by mouth daily.   Yes [provider]  gabapentin (NEURONTIN) 300 MG capsule Take 1 capsule (300 mg total) by mouth 3 (three) times daily. Patient taking differently: Take 300 mg by mouth 2 (two) times daily. 07/26/21  Yes Helane Rima, DO  loratadine (CLARITIN) 10 MG tablet Take 10 mg by mouth daily.   Yes [provider]  Na Sulfate-K Sulfate-Mg Sulf 17.5-3.13-1.6 GM/177ML SOLN As directed 05/30/22  Yes Rosaire Cueto, [name], MD  OVER THE COUNTER MEDICATION Take 100 mg by mouth 2 (two) times daily as needed (pain). CBD   Yes [provider]  Probiotic Product (PROBIOTIC BLEND PO) Take 1 capsule by  mouth daily.   Yes [provider]  propranolol (INDERAL) 20 MG tablet Take 1 tablet (20 mg total) by mouth 3 (three) times daily as needed (anxiety). 07/26/21  Yes Helane Rima, DO  traZODone (DESYREL) 100 MG tablet Take 1 tablet (100 mg total) by mouth at bedtime. 07/26/21  Yes Helane Rima, DO  Vitamin D, Ergocalciferol, (DRISDOL) 1.25 MG (50000 UNIT) CAPS capsule TAKE 1 CAPSULE BY MOUTH 2 TIMES A WEEK 03/27/22  Yes [name], [name], MD  tirzepatide [facility]) 7.5 MG/0.5ML Pen Inject 7.5 mg into the skin once a week. Patient not taking: Reported on 06/19/2022 11/28/21   Helane Rima, DO    Allergies as of 05/30/2022 - Review Complete 05/11/2022  Allergen Reaction Noted   Cashew nut oil Anaphylaxis 12/16/2017  Cauliflower [brassica oleracea] Anaphylaxis 12/16/2017   Whey Anaphylaxis 10/21/2016   Augmentin [amoxicillin-pot clavulanate] Diarrhea and Nausea And Vomiting 12/10/2011   Beeswax Itching and Swelling 11/22/2021   Honey Itching and Swelling 11/22/2021   Orange fruit [citrus] Other (See Comments) 12/16/2017   Orange oil Other (See Comments) 05/03/2022   Wheat bran Diarrhea 11/10/2020   Bee pollen Swelling 11/22/2021   Gluten meal Other (See Comments) 05/06/2019   Milk-related compounds  05/28/2013   Other  01/28/2018   Codeine Rash    Tree extract Itching 05/28/2013    Family History  Problem Relation Age of Onset   Hypertension Mother    Hyperlipidemia Mother    Diabetes Mother        pre-diabetes   Allergic rhinitis Mother    Asthma Mother    Urticaria Mother    Hypertension Father    Hyperlipidemia Father    Heart disease Father    Diabetes Father    Cancer Father        skin   Allergic rhinitis Father    Asthma Father    Urticaria Father    Diabetes Sister    Hypertension Sister    Allergic rhinitis Sister    Asthma Sister    Urticaria Sister    Pleurisy Son    Migraines Son    Heart disease Maternal Grandmother        V. Fib arrest   Cancer  Maternal Grandfather        lung and skin   Colon cancer Maternal Grandfather    Cancer Paternal Grandfather        small bowel cancer and skin   Colon polyps Other        aunt and uncle   Eczema Neg Hx     Social History   Socioeconomic History   Marital status: Married    Spouse name: Saralyn Pilar   Number of children: 3   Years of education: Not on file   Highest education level: Not on file  Occupational History   Occupation: school counselor  Tobacco Use   Smoking status: Never   Smokeless tobacco: Never  Vaping Use   Vaping Use: Never used  Substance and Sexual Activity   Alcohol use: No   Drug use: No   Sexual activity: Yes  Other Topics Concern   Not on file  Social History Narrative   Lives with husband married 43 years Dec 2021      Oldest is 76- son lives in [place]   12 daughter and 72 son year olds in home      Cat: Snickers       Enjoy: cooking some, puzzles, reading       Diet: eats all food groups outside allergies    Caffeine: 2 cans of diet soda daily and some tea   Water: 3 24 oz containers daily       Wears seat belt    Does not use phone while driving    Engineer, manufacturing systems -none          Social Determinants of Health   Financial Resource Strain: Not on file  Food Insecurity: Not on file  Transportation Needs: Not on file  Physical Activity: Not on file  Stress: Not on file  Social Connections: Not on file  Intimate Partner Violence: Not on file    Review of Systems: See HPI, otherwise negative ROS  Physical Exam: BP Marland Kitchen)  135/53   Pulse 61   Temp 97.7 F (36.5 C) (Oral)   Resp 18   LMP 11/07/2012   SpO2 96%  General:   Alert,  Well-developed, well-nourished, pleasant and cooperative in NAD Neck:  Supple; no masses or thyromegaly. No significant cervical adenopathy. Lungs:  Clear throughout to auscultation.   No wheezes, crackles, or rhonchi. No acute distress. Heart:  Regular rate and rhythm; no  murmurs, clicks, rubs,  or gallops. Abdomen: Non-distended, normal bowel sounds.  Soft and nontender without appreciable mass or hepatosplenomegaly.  Pulses:  Normal pulses noted. Extremities:  Without clubbing or edema.  Impression/Plan: 46 year old lady with right-sided abdominal pain constipation.  Intermittent rectal bleeding.  Thickening of gastric folds on CT.  Poorly controlled constipation.  No dysphagia.  I have offered the patient both a diagnostic EGD and a colonoscopy today. The risks, benefits, limitations, imponderables and alternatives regarding both EGD and colonoscopy have been reviewed with the patient. Questions have been answered. All parties agreeable.       Notice: This dictation was prepared with Dragon dictation along with smaller phrase technology. Any transcriptional errors that result from this process are unintentional and may not be corrected upon review.

## 2022-06-28 NOTE — Anesthesia Preprocedure Evaluation (Signed)
Anesthesia Evaluation  Patient identified by MRN, date of birth, ID band Patient awake    Reviewed: Allergy & Precautions, H&P , NPO status , Patient's Chart, lab work & pertinent test results, reviewed documented beta blocker date and time   Airway Mallampati: II  TM Distance: >3 FB Neck ROM: full    Dental no notable dental hx.    Pulmonary asthma , sleep apnea ,    Pulmonary exam normal breath sounds clear to auscultation       Cardiovascular Exercise Tolerance: Good negative cardio ROS   Rhythm:regular Rate:Normal     Neuro/Psych  Headaches, PSYCHIATRIC DISORDERS Anxiety Depression  Neuromuscular disease    GI/Hepatic Neg liver ROS, hiatal hernia, PUD, GERD  Medicated,  Endo/Other  Morbid obesity  Renal/GU negative Renal ROS  negative genitourinary   Musculoskeletal   Abdominal   Peds  Hematology  (+) Blood dyscrasia, anemia ,   Anesthesia Other Findings   Reproductive/Obstetrics negative OB ROS                             Anesthesia Physical Anesthesia Plan  ASA: 3  Anesthesia Plan: General   Post-op Pain Management:    Induction:   PONV Risk Score and Plan: Propofol infusion  Airway Management Planned:   Additional Equipment:   Intra-op Plan:   Post-operative Plan:   Informed Consent: I have reviewed the patients History and Physical, chart, labs and discussed the procedure including the risks, benefits and alternatives for the proposed anesthesia with the patient or authorized representative who has indicated his/her understanding and acceptance.     Dental Advisory Given  Plan Discussed with: CRNA  Anesthesia Plan Comments:         Anesthesia Quick Evaluation

## 2022-06-28 NOTE — Transfer of Care (Signed)
Immediate Anesthesia Transfer of Care Note  Patient: [name]  Procedure(s) Performed: COLONOSCOPY WITH PROPOFOL ESOPHAGOGASTRODUODENOSCOPY (EGD) WITH PROPOFOL BIOPSY  Patient Location: Short Stay  Anesthesia Type:General  Level of Consciousness: awake  Airway & Oxygen Therapy: Patient Spontanous Breathing  Post-op Assessment: Report given to RN and Post -op Vital signs reviewed and stable  Post vital signs: Reviewed and stable  Last Vitals:  Vitals Value Taken Time  BP 105/79 06/28/22 1039  Temp 36.7 C 06/28/22 1039  Pulse 71 06/28/22 1039  Resp 18 06/28/22 1039  SpO2 92 % 06/28/22 1039    Last Pain:  Vitals:   06/28/22 1039  TempSrc: Oral  PainSc:       Patients Stated Pain Goal: 6 (06/28/22 0845)  Complications: No notable events documented.

## 2022-06-28 NOTE — Discharge Instructions (Signed)
, Colonoscopy Discharge Instructions  Read the instructions outlined below and refer to this sheet in the next few weeks. These discharge instructions provide you with general information on caring for yourself after you leave the hospital. Your doctor may also give you specific instructions. While your treatment has been planned according to the most current medical practices available, unavoidable complications occasionally occur. If you have any problems or questions after discharge, call Dr. Jena Gauss at [phone]. ACTIVITY You may resume your regular activity, but move at a slower pace for the next 24 hours.  Take frequent rest periods for the next 24 hours.  Walking will help get rid of the air and reduce the bloated feeling in your belly (abdomen).  No driving for 24 hours (because of the medicine (anesthesia) used during the test).   Do not sign any important legal documents or operate any machinery for 24 hours (because of the anesthesia used during the test).  NUTRITION Drink plenty of fluids.  You may resume your normal diet as instructed by your doctor.  Begin with a light meal and progress to your normal diet. Heavy or fried foods are harder to digest and may make you feel sick to your stomach (nauseated).  Avoid alcoholic beverages for 24 hours or as instructed.  MEDICATIONS You may resume your normal medications unless your doctor tells you otherwise.  WHAT YOU CAN EXPECT TODAY Some feelings of bloating in the abdomen.  Passage of more gas than usual.  Spotting of blood in your stool or on the toilet paper.  IF YOU HAD POLYPS REMOVED DURING THE COLONOSCOPY: No aspirin products for 7 days or as instructed.  No alcohol for 7 days or as instructed.  Eat a soft diet for the next 24 hours.  FINDING OUT THE RESULTS OF YOUR TEST Not all test results are available during your visit. If your test results are not back during the visit, make an appointment with your caregiver to find out  the results. Do not assume everything is normal if you have not heard from your caregiver or the medical facility. It is important for you to follow up on all of your test results.  SEEK IMMEDIATE MEDICAL ATTENTION IF: You have more than a spotting of blood in your stool.  Your belly is swollen (abdominal distention).  You are nauseated or vomiting.  You have a temperature over 101.  You have abdominal pain or discomfort that is severe or gets worse throughout the day.    EGD Discharge instructions Please read the instructions outlined below and refer to this sheet in the next few weeks. These discharge instructions provide you with general information on caring for yourself after you leave the hospital. Your doctor may also give you specific instructions. While your treatment has been planned according to the most current medical practices available, unavoidable complications occasionally occur. If you have any problems or questions after discharge, please call your doctor. ACTIVITY You may resume your regular activity but move at a slower pace for the next 24 hours.  Take frequent rest periods for the next 24 hours.  Walking will help expel (get rid of) the air and reduce the bloated feeling in your abdomen.  No driving for 24 hours (because of the anesthesia (medicine) used during the test).  You may shower.  Do not sign any important legal documents or operate any machinery for 24 hours (because of the anesthesia used during the test).  NUTRITION Drink plenty of fluids.  You may resume your normal diet.  Begin with a light meal and progress to your normal diet.  Avoid alcoholic beverages for 24 hours or as instructed by your caregiver.  MEDICATIONS You may resume your normal medications unless your caregiver tells you otherwise.  WHAT YOU CAN EXPECT TODAY You may experience abdominal discomfort such as a feeling of fullness or "gas" pains.  FOLLOW-UP Your doctor will discuss the  results of your test with you.  SEEK IMMEDIATE MEDICAL ATTENTION IF ANY OF THE FOLLOWING OCCUR: Excessive nausea (feeling sick to your stomach) and/or vomiting.  Severe abdominal pain and distention (swelling).  Trouble swallowing.  Temperature over 101 F (37.8 C).  Rectal bleeding or vomiting of blood.    Abnormal CT was a result of your large hiatal hernia.  I took samples of your small intestine  You have a long colon only internal hemorrhoids found.  Need better management of constipation.  Stop by my office on your way home to pick up 3 weeks worth of [id number] gelcap take 1 daily for constipation.  It is gluten-free.  Call the office in 3 weeks and let us know how you are doing overall.  Office visit with [place] in 3 months  Further recommendations to follow pending review of pathology report  At patient request, I called Tamicka Shimon at [phone] -reviewed findings and recommendations

## 2022-06-29 LAB — SURGICAL PATHOLOGY

## 2022-06-29 NOTE — Anesthesia Postprocedure Evaluation (Signed)
Anesthesia Post Note  Patient: LANAH STEINES  Procedure(s) Performed: COLONOSCOPY WITH PROPOFOL ESOPHAGOGASTRODUODENOSCOPY (EGD) WITH PROPOFOL BIOPSY  Patient location during evaluation: Phase II Anesthesia Type: General Level of consciousness: awake Pain management: pain level controlled Vital Signs Assessment: post-procedure vital signs reviewed and stable Respiratory status: spontaneous breathing and respiratory function stable Cardiovascular status: blood pressure returned to baseline and stable Postop Assessment: no headache and no apparent nausea or vomiting Anesthetic complications: no Comments: Late entry   No notable events documented.   Last Vitals:  Vitals:   06/28/22 0845 06/28/22 1039  BP: (!) 135/53 105/79  Pulse: 61 71  Resp: 18 18  Temp: 36.5 C 36.7 C  SpO2: 96% 92%    Last Pain:  Vitals:   06/28/22 1039  TempSrc: Oral  PainSc:                  Windell Norfolk

## 2022-07-03 ENCOUNTER — Encounter: Payer: Self-pay | Admitting: Internal Medicine

## 2022-07-05 ENCOUNTER — Encounter (HOSPITAL_COMMUNITY): Payer: Self-pay | Admitting: Internal Medicine

## 2022-07-06 ENCOUNTER — Ambulatory Visit: Payer: BC Managed Care – PPO | Admitting: Internal Medicine

## 2022-07-09 ENCOUNTER — Ambulatory Visit (INDEPENDENT_AMBULATORY_CARE_PROVIDER_SITE_OTHER): Payer: BC Managed Care – PPO | Admitting: Family Medicine

## 2022-07-09 ENCOUNTER — Encounter: Payer: Self-pay | Admitting: Physician Assistant

## 2022-07-09 ENCOUNTER — Ambulatory Visit: Payer: Self-pay

## 2022-07-09 ENCOUNTER — Ambulatory Visit (INDEPENDENT_AMBULATORY_CARE_PROVIDER_SITE_OTHER): Payer: BC Managed Care – PPO | Admitting: Physician Assistant

## 2022-07-09 ENCOUNTER — Ambulatory Visit (INDEPENDENT_AMBULATORY_CARE_PROVIDER_SITE_OTHER): Payer: BC Managed Care – PPO

## 2022-07-09 VITALS — BP 120/68 | HR 69 | Temp 97.8°F | Ht 64.49 in | Wt 314.8 lb

## 2022-07-09 VITALS — BP 120/68 | HR 69 | Ht 64.0 in | Wt 314.0 lb

## 2022-07-09 DIAGNOSIS — F339 Major depressive disorder, recurrent, unspecified: Secondary | ICD-10-CM | POA: Diagnosis not present

## 2022-07-09 DIAGNOSIS — M25562 Pain in left knee: Secondary | ICD-10-CM | POA: Diagnosis not present

## 2022-07-09 DIAGNOSIS — M25551 Pain in right hip: Secondary | ICD-10-CM | POA: Diagnosis not present

## 2022-07-09 DIAGNOSIS — M25552 Pain in left hip: Secondary | ICD-10-CM

## 2022-07-09 DIAGNOSIS — F32A Depression, unspecified: Secondary | ICD-10-CM

## 2022-07-09 DIAGNOSIS — G8929 Other chronic pain: Secondary | ICD-10-CM | POA: Diagnosis not present

## 2022-07-09 DIAGNOSIS — F419 Anxiety disorder, unspecified: Secondary | ICD-10-CM | POA: Diagnosis not present

## 2022-07-09 DIAGNOSIS — F418 Other specified anxiety disorders: Secondary | ICD-10-CM

## 2022-07-09 DIAGNOSIS — M25561 Pain in right knee: Secondary | ICD-10-CM

## 2022-07-09 MED ORDER — DULOXETINE HCL 30 MG PO CPEP: 30.0000 mg | ORAL_CAPSULE | Freq: Every day | ORAL | 0 refills | Status: DC

## 2022-07-09 NOTE — Progress Notes (Unsigned)
I, Philbert Riser, LAT, ATC acting as a scribe for [name], MD.  [name] is a 46 y.o. female who presents to [facility] at [facility] today for bilat knee and bilat hip pain, R>L. Pt was last seen by Dr. Denyse Amass on 03/08/21 and completed the Gelsyn series in her L knee and the R knee and 01/25/21. Today, pt reports both knees had been feeling until the last few months. Pt had back surgery Feb 2023 and then suffered a blood clot. Pt locates her pain to the lateral aspect of the hip, esp the R hip. Pt locates knee pain deep within the knee joints bilaterally.   Dx imaging: 11/10/21 L-spine XR 10/19/20 R hip & R knee XR 08/25/19 R & L knee XR 05/11/19 L-spine XR 01/02/19 L-spine CT 08/27/18 R & L knee XR  Pertinent review of systems: No fevers or chills  Relevant historical information: Provoked DVT following a lumbar surgery.  Morbid obesity.   Exam:  BP 120/68   Pulse 69   Ht 5\' 4"  (1.626 m)   Wt (!) 314 lb (142.4 kg)   LMP 11/07/2012   SpO2 95%   BMI 53.90 kg/m  General: Well Developed, well nourished, and in no acute distress.   MSK: Right hip: Normal-appearing Normal motion. Tender palpation greater trochanter. Hip abduction strength diminished 4/5.  Left hip normal-appearing normal motion tender palpation greater trochanter reduced hip abduction strength.  Bilateral knees mild effusion normal motion with crepitation.     Lab and Radiology Results  X-ray images bilateral knees obtained today personally and independently interpreted.  Right knee: Moderate medial compartment DJD.  Mild patellofemoral DJD.  No acute fractures.  Left knee: Moderate medial compartment DJD.  Mild patellofemoral DJD.  No acute fractures.  Await formal radiology review     Assessment and Plan: 46 y.o. female with right hip pain and bilateral knee pain. The knee pain is due to an exacerbation of underlying chronic DJD.  Her weight gain and deconditioning  following surgery is likely a significant factor here.  Plan to refer back to physical therapy which has worked in the past.  Additionally we will proceed to hyaluronic acid injections which also has helped in the past quite a bit.  We need to work on authorization now for hyaluronic acid injections which we should be able to get soon.  Lateral hip pain due to trochanteric bursitis.  General weakness and deconditioning is the main factor.  Physical therapy should be quite helpful.   PDMP not reviewed this encounter. Orders Placed This Encounter  Procedures   DG Knee AP/LAT W/Sunrise Right    Standing Status:   Future    Number of Occurrences:   1    Standing Expiration Date:   07/10/2023    Order Specific Question:   Reason for Exam (SYMPTOM  OR DIAGNOSIS REQUIRED)    Answer:   eval knee pain    Order Specific Question:   Is patient pregnant?    Answer:   No    Order Specific Question:   Preferred imaging location?    Answer:   09/09/2023   DG Knee AP/LAT W/Sunrise Left    Standing Status:   Future    Number of Occurrences:   1    Standing Expiration Date:   07/10/2023    Order Specific Question:   Reason for Exam (SYMPTOM  OR DIAGNOSIS REQUIRED)    Answer:   eval knee  pain    Order Specific Question:   Is patient pregnant?    Answer:   No    Order Specific Question:   Preferred imaging location?    Answer:   [name]   Ambulatory referral to Physical Therapy    Referral Priority:   Routine    Referral Type:   Physical Medicine    Referral Reason:   Specialty Services Required    Requested Specialty:   Physical Therapy    Number of Visits Requested:   1   No orders of the defined types were placed in this encounter.    Discussed warning signs or symptoms. Please see discharge instructions. Patient expresses understanding.   The above documentation has been reviewed and is accurate and complete [name], M.D.

## 2022-07-09 NOTE — Patient Instructions (Addendum)
Thank you for coming in today.   Please get an Xray today before you leave   Plan for gel shots.   I've referred you to Physical Therapy.  Let us know if you don't hear from them in one week.

## 2022-07-09 NOTE — Assessment & Plan Note (Signed)
See depression and situational anxiety A&P

## 2022-07-09 NOTE — Patient Instructions (Signed)
Please stop the Buspar and only use Propanolol for your anxiety Please start to taper your Cymbalta by doing the following: 60 mg once per day for 7 days then 30 mg once per day for 7 days until your follow up apt.

## 2022-07-09 NOTE — Assessment & Plan Note (Signed)
Chronic, ongoing with recent exacerbations Reports Propanolol seems to assist more with anxiety flares over Buspar  Will dc Buspar today to help reduce medication load Can continue with Propanolol but would prefer to replace this eventually - maybe hydroxyzine? Discussed goal of reducing need for PRN anxiolytics to less than once per day and she is amenable to this. She is currently taking Propanolol once per day and then using additional doses as needed.  She is going to taper Cymbalta and we will start cross-titration with new SNRI in 2 weeks- likely Venlafaxine  Follow up in 2 weeks

## 2022-07-09 NOTE — Assessment & Plan Note (Signed)
Chronic, historic condition  Reports recent exacerbations to depression and anxiety - see HPI  Reports she is taking Cymbalta 60 mg PO QD regularly- sometimes takes a second dose  Recommend changing regimen at this time - Will start taper of Cymbalta to 60 mg PO QD for 7 days then 30mg  PO QD for 7 days Patient is amenable to trying a different medication- will consider Venlafaxine given reduced weight gain and potential benefit for fibromyalgia as replacement Recommend she start looking for therapists to assist with depression and anxiety- will make referral as needed  Follow up in 2 weeks as taper is wrapping up to discuss switch to Venlafaxine and leads on therapy

## 2022-07-09 NOTE — Progress Notes (Signed)
Established Patient Office Visit  Name: [name]   MRN: [id number]    DOB: 1976-10-17   Date:07-29-22  Today's Provider: Jacquelin Hawking, MHS, PA-C Introduced myself to the patient as a PA-C and provided education on APPs in clinical practice.         Subjective  Chief Complaint  Chief Complaint  Patient presents with   Hypertension   Migraine   Anxiety   Depression    HPI  She is going to sports med for knee pain and swelling later today She has had Cortisol shots bilaterally and reports her sports med doc may recommend PT for assistance   Discussed potential wheat allergy- she has been found negative for Celiac but still feels there might be a sensitivity    ANXIETY/STRESS Feels like her emotions can spiral up and out of control when she has anxiety and triggers Taking Duloxetine 60 mg PO QAM- sometimes takes it at lunch time but is not consistent  Reports she overeats when upset  Has been trying to find a therapist/ counselor  She has sister and son with bipolar disorder- she is concerned for this in terms of her own behaviors - denies typical mania symptoms Reports concerns with back pain and leg pain which prevents exercise  Reports concerns with weight and reduced   She has been on Wellbutrin but states this stopped working  She is taking Propanolol once per day regularly and PRN    Duration:exacerbated Anxious mood: yes  Excessive worrying: yes Irritability: yes  Sweating: no Nausea: no Palpitations:no Hyperventilation: no Panic attacks: yes Agoraphobia: yes  Obscessions/compulsions: no Depressed mood: yes    07/29/22   11:26 AM 01/03/2022    1:51 PM 11/22/2021    1:49 PM 11/11/2020    8:05 AM 11/04/2020    8:53 AM  Depression screen PHQ 2/9  Decreased Interest 1 0 0 0 0  Down, Depressed, Hopeless 1 0 0 0 0  PHQ - 2 Score 2 0 0 0 0  Altered sleeping Tired, decreased energy 0  Change in appetite 0  Feeling  bad or failure about yourself  1 0 0 0 1  Trouble concentrating 1 0 1 1 0  Moving slowly or fidgety/restless 0 1 0 0 0  Suicidal thoughts 0 0 0 0 0  PHQ-9 Score Difficult doing work/chores Somewhat difficult   Not difficult at all Somewhat difficult   Anhedonia: yes Weight changes: yes Insomnia: yes hard to fall asleep  Hypersomnia: no Fatigue/loss of energy: yes Feelings of worthlessness: yes- concerns for physical health and impact on ADLs have created this concern Feelings of guilt: yes Impaired concentration/indecisiveness: no Suicidal ideations: no  Crying spells: yes Recent Stressors/Life Changes: yes   Relationship problems: yes- reports intimacy decrease and spousal frustrations.    Family stress: yes  son has just left for college and daughter is starting new grade   Financial stress: yes    Job stress: yes Reports feeling overwhelmed with new job expectations. Feels stretched between responsibilities    Recent death/loss: yes church and neighbor recently passed away     2022-07-29   11:27 AM 01/03/2022    1:52 PM 11/22/2021    1:50 PM 11/04/2020    8:54 AM  GAD 7 : Generalized Anxiety Score  Nervous, Anxious, on Edge 1 0  0 2  Control/stop worrying 1 0 0 3  Worry too much - different things 1 0 0 3  Trouble relaxing 1 2 1 2   Restless 0 0 0 0  Easily annoyed or irritable 0 1 1 3   Afraid - awful might happen 1 0 0 3  Total GAD 7 Score 5 3 2 16   Anxiety Difficulty Somewhat difficult Not difficult at all Not difficult at all Very difficult       Patient Active Problem List   Diagnosis Date Noted   Hiatal hernia 05/11/2022   RLQ abdominal pain 05/11/2022   Deep vein thrombosis (DVT) of popliteal vein of right lower extremity (HCC) 01/11/2022   Hyponatremia 01/03/2022   Bilateral lower extremity edema 11/22/2021   Chronic pain of right knee 01/11/2021   Primary osteoarthritis of right knee 01/11/2021   Vitamin D deficiency 10/06/2020   Prediabetes  10/06/2020   Anxiety, with emotional eating 10/06/2020   Mixed hyperlipidemia 10/06/2020   Situational anxiety 09/08/2020   Migraine without aura and without status migrainosus, not intractable 09/08/2020   Mild intermittent asthma without complication 05/24/2020   OSA on CPAP 07/09/2019   Degenerative scoliosis in adult patient 05/11/2019   Anxiety and depression 04/08/2019   Fibromyalgia 04/08/2019   Primary osteoarthritis of both knees 09/17/2018   Primary osteoarthritis of both hands 09/17/2018   DDD (degenerative disc disease), lumbar 08/27/2018   Multiple food allergies 07/28/2018   Iron deficiency anemia 02/07/2018   Prolapsed lumbar disc 12/27/2017   Major depression, recurrent, chronic (HCC) 01/22/2011   Morbid obesity with BMI of 50.0-59.9, adult (HCC) 02/13/2010    Past Surgical History:  Procedure Laterality Date   ABDOMINAL HYSTERECTOMY     ACHILLES TENDON LENGTHENING Bilateral 1989   ANKLE GANGLION CYST EXCISION  02/2012   left ankle   ANTERIOR LAT LUMBAR FUSION Right 05/11/2019   Procedure: ANTERIOR LATERAL LUMBAR INTERBODY FUSION, LATERAL INSTRUMENTATION, RIGHT LUMBAR TWO- LUMBAR THREE;  Surgeon: 02/15/2010, MD;  Location: [facility] OR;  Service: Neurosurgery;  Laterality: Right;  ANTERIOR LATERAL LUMBAR INTERBODY FUSION, LATERAL INSTRUMENTATION, RIGHT LUMBAR TWO- LUMBAR THREE   BIOPSY  03/16/2019   Procedure: BIOPSY;  Surgeon: Tressie Stalker, MD;  Location: [facility];  Service: Endoscopy;;  Gastric    BIOPSY  06/28/2022   Procedure: BIOPSY;  Surgeon: 03/18/2019, MD;  Location: [facility];  Service: Endoscopy;;   COLONOSCOPY WITH PROPOFOL N/A 03/16/2019   Dr. 06/30/2022: Normal terminal ileum, 15 cm.  Normal colon.  Next colonoscopy 10 years   COLONOSCOPY WITH PROPOFOL N/A 06/28/2022   Procedure: COLONOSCOPY WITH PROPOFOL;  Surgeon: 03/18/2019, MD;  Location: [facility];  Service: Endoscopy;  Laterality: N/A;  W/ ILEOSCOPY, 11:00am, pt knows to arrive  at 8:15   DILATION AND CURETTAGE OF UTERUS  2003   ENTEROSCOPY N/A 04/02/2019   Dr. Corbin Ade: bulbar erosions, jejunal ulcerations, attempted biopsy but scope fell backwards and lesions cannot be reidentified.  No evidence of ampullary lesion or mass.   ESOPHAGOGASTRODUODENOSCOPY (EGD) WITH PROPOFOL N/A 03/16/2019   Dr. 06/02/2019: Large hiatal hernia, Cameron/antral erosions, duodenal erosions.  Gastric biopsy showed reactive gastropathy, mild chronic gastritis, negative for H. pylori   ESOPHAGOGASTRODUODENOSCOPY (EGD) WITH PROPOFOL N/A 06/28/2022   Procedure: ESOPHAGOGASTRODUODENOSCOPY (EGD) WITH PROPOFOL;  Surgeon: 03/18/2019, MD;  [facility];  Service: Endoscopy;  Laterality: N/A;   FEET SURGERY Bilateral 1990   INSERTION OF BONE GRAFT IN FEET   GIVENS CAPSULE  STUDY N/A 03/16/2019   Procedure: GIVENS CAPSULE STUDY;  Surgeon: Corbin Ade, MD;  Location: [facility];  Service: Endoscopy;  Laterality: N/A;   LAPAROSCOPIC GASTRIC BANDING  10/17/2010   Dr Gaynelle Adu; [facility]   LAPAROSCOPIC REPAIR AND REMOVAL OF GASTRIC BAND  01/2018   LIPOMA EXCISION  02/2006   abdomen   LUMBAR LAMINECTOMY     SPINE SURGERY N/A    [employer]reesia 11/10/2020   TRANSTHORACIC ECHOCARDIOGRAM  11/2021   Normal LV size and function.  EF 60 to 65%.  Mild LVH.  GR 1 DD.  No MR.  Normal LA, RA size.  Normal RV size and function.  Unable to assess RVP.  Normal estimated RAP.  Normal valves.    Family History  Problem Relation Age of Onset   Hypertension Mother    Hyperlipidemia Mother    Diabetes Mother        pre-diabetes   Allergic rhinitis Mother    Asthma Mother    Urticaria Mother    Hypertension Father    Hyperlipidemia Father    Heart disease Father    Diabetes Father    Cancer Father        skin   Allergic rhinitis Father    Asthma Father    Urticaria Father    Diabetes Sister    Hypertension Sister    Allergic rhinitis Sister    Asthma Sister    Urticaria Sister    Pleurisy  Son    Migraines Son    Heart disease Maternal Grandmother        V. Fib arrest   Cancer Maternal Grandfather        lung and skin   Colon cancer Maternal Grandfather    Cancer Paternal Grandfather        small bowel cancer and skin   Colon polyps Other        aunt and uncle   Eczema Neg Hx     Social History   Tobacco Use   Smoking status: Never   Smokeless tobacco: Never  Substance Use Topics   Alcohol use: No     Current Outpatient Medications:    albuterol (VENTOLIN HFA) 108 (90 Base) MCG/ACT inhaler, Inhale 2 puffs into the lungs every 6 (six) hours as needed for wheezing., Disp: , Rfl:    aspirin EC 81 MG tablet, Take 1 tablet (81 mg total) by mouth daily. Swallow whole. (Patient taking differently: Take 81 mg by mouth every other day. Swallow whole.), Disp: 90 tablet, Rfl: 3   atorvastatin (LIPITOR) 40 MG tablet, Take 1 tablet (40 mg total) by mouth daily. (Patient taking differently: Take 40 mg by mouth every evening.), Disp: 90 tablet, Rfl: 3   butalbital-acetaminophen-caffeine (FIORICET) 50-325-40 MG tablet, Take 1 tablet by mouth every 6 (six) hours as needed for headache., Disp: 30 tablet, Rfl: 0   DULoxetine (CYMBALTA) 30 MG capsule, Take 1 capsule (30 mg total) by mouth daily. Take Cymbalta 60 mg once per day for one week. Then start 30 mg for one week prior to follow up., Disp: 10 capsule, Rfl: 0   EPINEPHrine 0.3 mg/0.3 mL IJ SOAJ injection, Inject 0.3 mg into the muscle as needed for anaphylaxis., Disp: , Rfl:    furosemide (LASIX) 20 MG tablet, Take 20 mg by mouth daily., Disp: , Rfl:    gabapentin (NEURONTIN) 300 MG capsule, Take 1 capsule (300 mg total) by mouth 3 (three) times daily. (Patient taking differently: Take 300  mg by mouth 2 (two) times daily.), Disp: 270 capsule, Rfl: 3   loratadine (CLARITIN) 10 MG tablet, Take 10 mg by mouth daily., Disp: , Rfl:    OVER THE COUNTER MEDICATION, Take 100 mg by mouth 2 (two) times daily as needed (pain). CBD, Disp: ,  Rfl:    Probiotic Product (PROBIOTIC BLEND PO), Take 1 capsule by mouth daily., Disp: , Rfl:    propranolol (INDERAL) 20 MG tablet, Take 1 tablet (20 mg total) by mouth 3 (three) times daily as needed (anxiety)., Disp: 90 tablet, Rfl: 3   traZODone (DESYREL) 100 MG tablet, Take 1 tablet (100 mg total) by mouth at bedtime., Disp: 90 tablet, Rfl: 3   Vitamin D, Ergocalciferol, (DRISDOL) 1.25 MG (50000 UNIT) CAPS capsule, TAKE 1 CAPSULE BY MOUTH 2 TIMES A WEEK, Disp: 24 capsule, Rfl: 0  Allergies  Allergen Reactions   Cashew Nut Oil Anaphylaxis   Cauliflower [Brassica Oleracea] Anaphylaxis   Whey Anaphylaxis   Augmentin [Amoxicillin-Pot Clavulanate] Diarrhea and Nausea And Vomiting    Has patient had a PCN reaction causing immediate rash, facial/tongue/throat swelling, SOB or lightheadedness with hypotension: No Has patient had a PCN reaction causing severe rash involving mucus membranes or skin necrosis: No Has patient had a PCN reaction that required hospitalization: No Has patient had a PCN reaction occurring within the last 10 years: Unknown If all of the above answers are "NO", then may proceed with Cephalosporin use.    Beeswax Itching and Swelling   Honey Itching and Swelling   Orange Fruit [Citrus] Other (See Comments)    ALL CITRUS FRUITS Migraines    Orange Oil Other (See Comments)   Wheat Bran Diarrhea   Bee Pollen Swelling   Gluten Meal Other (See Comments)    Severe GI upset   Milk-Related Compounds    Other     epidural steroid injection - severe headaches, high fever   Codeine Rash   Tree Extract Itching    Tee tree oil    I personally reviewed active problem list, medication list, allergies, notes from last encounter, lab results with the patient/caregiver today.   Review of Systems  Constitutional:  Positive for malaise/fatigue. Negative for weight loss.  Cardiovascular:  Positive for leg swelling.  Musculoskeletal:  Positive for joint pain.   Psychiatric/Behavioral:  Positive for depression. Negative for suicidal ideas. The patient is nervous/anxious and has insomnia.       Objective  Vitals:   07/09/22 1038 07/09/22 1048  BP: (!) 146/79 120/68  Pulse: 91 69  Temp: 97.8 F (36.6 C)   TempSrc: Oral   SpO2: 95%   Weight: (!) 314 lb 12.8 oz (142.8 kg)   Height: 5' 4.49" (1.638 m)     Body mass index is 53.22 kg/m.  Physical Exam Vitals reviewed.  Constitutional:      General: She is awake.     Appearance: Normal appearance. She is well-developed and well-groomed. She is morbidly obese.  Pulmonary:     Effort: Pulmonary effort is normal.  Neurological:     Mental Status: She is alert.  Psychiatric:        Attention and Perception: Attention and perception normal.        Mood and Affect: Mood is depressed. Affect is tearful.        Speech: Speech normal.        Behavior: Behavior normal. Behavior is cooperative.        Thought Content: Thought content normal.  Cognition and Memory: Cognition and memory normal.        Judgment: Judgment normal.      Recent Results (from the past 2160 hour(s))  CBC with Differential/Platelet     Status: Abnormal   Collection Time: 06/26/22  3:10 PM  Result Value Ref Range   WBC 7.9 4.0 - 10.5 K/uL   RBC 4.34 3.87 - 5.11 MIL/uL   Hemoglobin 13.6 12.0 - 15.0 g/dL   HCT 10.6 26.9 - 48.5 %   MCV 92.4 80.0 - 100.0 fL   MCH 31.3 26.0 - 34.0 pg   MCHC 33.9 30.0 - 36.0 g/dL   RDW 46.2 70.3 - 50.0 %   Platelets 294 150 - 400 K/uL   nRBC 0.0 0.0 - 0.2 %   Neutrophils Relative % 52 %   Neutro Abs 4.2 1.7 - 7.7 K/uL   Lymphocytes Relative 30 %   Lymphs Abs 2.4 0.7 - 4.0 K/uL   Monocytes Relative 8 %   Monocytes Absolute 0.6 0.1 - 1.0 K/uL   Eosinophils Relative 8 %   Eosinophils Absolute 0.6 (H) 0.0 - 0.5 K/uL   Basophils Relative 1 %   Basophils Absolute 0.1 0.0 - 0.1 K/uL   Immature Granulocytes 1 %   Abs Immature Granulocytes 0.04 0.00 - 0.07 K/uL    Comment:  Performed at [facility], [address], [place], [place] [address]  Basic metabolic panel     Status: Abnormal   Collection Time: 06/26/22  3:10 PM  Result Value Ref Range   Sodium 137 135 - 145 mmol/L   Potassium 3.8 3.5 - 5.1 mmol/L   Chloride 103 98 - 111 mmol/L   CO2 24 22 - 32 mmol/L   Glucose, Bld 97 70 - 99 mg/dL    Comment: Glucose reference range applies only to samples taken after fasting for at least 8 hours.   BUN 10 6 - 20 mg/dL   Creatinine, Ser 2.99 (H) 0.44 - 1.00 mg/dL   Calcium 8.9 8.9 - 37.1 mg/dL   GFR, Estimated >69 >67 mL/min    Comment: (NOTE) Calculated using the CKD-EPI Creatinine Equation (2021)    Anion gap 10 5 - 15    Comment: Performed at [facility], [address], [place], [place] [address]  Surgical pathology     Status: None   Collection Time: 06/28/22 10:19 AM  Result Value Ref Range   SURGICAL PATHOLOGY      SURGICAL PATHOLOGY CASE: [id number] PATIENT: Lamia Labree Surgical Pathology Report     Clinical History: RLQ abd pain, abnormal CT stomach, duodenum jejunum     FINAL MICROSCOPIC DIAGNOSIS:  A. DUODENUM, BIOPSY: - Duodenal mucosa with normal villous architecture. - No villous atrophy or increased intraepithelial lymphocytes.    GROSS DESCRIPTION:  Received in formalin are tan, soft tissue fragments that are submitted in toto. Number: 4 size: Range from 0.2 to 0.3 cm blocks: 1 Lovey Newcomer 06/28/2022)    Final Diagnosis performed by Jimmy Picket, MD.   Electronically signed 06/29/2022 Technical component performed at [facility], [address], [place], [place] [address].  Professional component performed at [employer], [address], [place], [place] [address].  Immunohistochemistry Technical component (if applicable) was performed at [facility]. [address], [place], [place].   IMMUNOHISTOCHEMISTRY DISCLAIMER (if applicable): Some of  these immunohistochemical stains may have been developed and the performance characteristics determine by [facility] Some  may not have been cleared or approved by the U.S. Food and Drug Administration. The FDA has determined that such clearance or approval is not necessary. This test is used for clinical purposes. It should not be regarded as investigational or for research. This laboratory is certified under the Clinical Laboratory Improvement Amendments of 1988 (CLIA-88) as qualified to perform high complexity clinical laboratory testing.  The controls stained appropriately.      PHQ2/9:    07/09/2022   11:26 AM 01/03/2022    1:51 PM 11/22/2021    1:49 PM 11/11/2020    8:05 AM 11/04/2020    8:53 AM  Depression screen PHQ 2/9  Decreased Interest 1 0 0 0 0  Down, Depressed, Hopeless 1 0 0 0 0  PHQ - 2 Score 2 0 0 0 0  Altered sleeping Tired, decreased energy 0  Change in appetite 0  Feeling bad or failure about yourself  1 0 0 0 1  Trouble concentrating 1 0 1 1 0  Moving slowly or fidgety/restless 0 1 0 0 0  Suicidal thoughts 0 0 0 0 0  PHQ-9 Score Difficult doing work/chores Somewhat difficult   Not difficult at all Somewhat difficult      Fall Risk:    07/09/2022   10:46 AM 11/22/2021    1:49 PM 11/11/2020    8:04 AM 09/08/2020    3:07 PM 05/13/2020    3:57 PM  Fall Risk   Falls in the past year? 1 0 0 0 0  Number falls in past yr: 0 0 0 0   Injury with Fall? 0 0 0 0   Risk for fall due to : No Fall Risks No Fall Risks No Fall Risks No Fall Risks   Follow up Falls evaluation completed Falls evaluation completed Falls evaluation completed Falls evaluation completed       Functional Status Survey:      Assessment & Plan  Problem List Items Addressed This Visit       Other   Major depression, recurrent, chronic (HCC) - Primary    Chronic, historic condition  Reports recent exacerbations to depression and  anxiety - see HPI  Reports she is taking Cymbalta 60 mg PO QD regularly- sometimes takes a second dose  Recommend changing regimen at this time - Will start taper of Cymbalta to 60 mg PO QD for 7 days then  PO QD for 7 days Patient is amenable to trying a different medication- will consider Venlafaxine given reduced weight gain and potential benefit for fibromyalgia as replacement Recommend she start looking for therapists to assist with depression and anxiety- will make referral as needed  Follow up in 2 weeks as taper is wrapping up to discuss switch to Venlafaxine and leads on therapy        Relevant Medications   DULoxetine (CYMBALTA) 30 MG capsule   Anxiety and depression    See depression and situational anxiety A&P      Relevant Medications   DULoxetine (CYMBALTA) 30 MG capsule   Situational anxiety    Chronic, ongoing with recent exacerbations Reports Propanolol seems to assist more with anxiety flares over Buspar  Will dc Buspar today to help reduce medication load Can continue with Propanolol but would prefer to replace this eventually - maybe hydroxyzine? Discussed goal of reducing need for PRN anxiolytics to less than once  per day and she is amenable to this. She is currently taking Propanolol once per day and then using additional doses as needed.  She is going to taper Cymbalta and we will start cross-titration with new SNRI in 2 weeks- likely Venlafaxine  Follow up in 2 weeks      Relevant Medications   DULoxetine (CYMBALTA) 30 MG capsule     Return in about 2 weeks (around 07/23/2022) for Depression, Anxiety -virtual okay.   I, Danijah Noh E Insiya Oshea, PA-C, have reviewed all documentation for this visit. The documentation on 07/09/22 for the exam, diagnosis, procedures, and orders are all accurate and complete.   Jacquelin Hawking, MHS, PA-C [facility]

## 2022-07-11 NOTE — Progress Notes (Signed)
Left knee x-ray shows medium arthritis changes.

## 2022-07-11 NOTE — Progress Notes (Signed)
Right knee x-ray shows medium arthritis changes.

## 2022-07-19 ENCOUNTER — Ambulatory Visit: Payer: BC Managed Care – PPO | Admitting: Cardiology

## 2022-07-24 ENCOUNTER — Ambulatory Visit: Payer: Self-pay

## 2022-07-24 ENCOUNTER — Telehealth (INDEPENDENT_AMBULATORY_CARE_PROVIDER_SITE_OTHER): Payer: BC Managed Care – PPO | Admitting: Physician Assistant

## 2022-07-24 ENCOUNTER — Ambulatory Visit (INDEPENDENT_AMBULATORY_CARE_PROVIDER_SITE_OTHER): Payer: BC Managed Care – PPO | Admitting: Family Medicine

## 2022-07-24 ENCOUNTER — Encounter: Payer: Self-pay | Admitting: Physician Assistant

## 2022-07-24 VITALS — BP 120/68 | HR 70 | Ht 64.0 in | Wt 316.0 lb

## 2022-07-24 DIAGNOSIS — F339 Major depressive disorder, recurrent, unspecified: Secondary | ICD-10-CM | POA: Diagnosis not present

## 2022-07-24 DIAGNOSIS — M25562 Pain in left knee: Secondary | ICD-10-CM | POA: Diagnosis not present

## 2022-07-24 DIAGNOSIS — F418 Other specified anxiety disorders: Secondary | ICD-10-CM | POA: Diagnosis not present

## 2022-07-24 DIAGNOSIS — M25561 Pain in right knee: Secondary | ICD-10-CM | POA: Diagnosis not present

## 2022-07-24 DIAGNOSIS — M1712 Unilateral primary osteoarthritis, left knee: Secondary | ICD-10-CM | POA: Diagnosis not present

## 2022-07-24 DIAGNOSIS — G8929 Other chronic pain: Secondary | ICD-10-CM | POA: Diagnosis not present

## 2022-07-24 DIAGNOSIS — F419 Anxiety disorder, unspecified: Secondary | ICD-10-CM

## 2022-07-24 MED ORDER — SODIUM HYALURONATE (VISCOSUP) 16.8 MG/2ML IX SOSY
33.6000 mg | PREFILLED_SYRINGE | Freq: Once | INTRA_ARTICULAR | Status: AC
  Administered 2022-07-24: 33.6 mg via INTRA_ARTICULAR

## 2022-07-24 MED ORDER — VENLAFAXINE HCL ER 37.5 MG PO CP24: 37.5000 mg | ORAL_CAPSULE | Freq: Every day | ORAL | 1 refills | Status: DC

## 2022-07-24 NOTE — Assessment & Plan Note (Signed)
Chronic, historic condition Reports mild improvement in mood since she has been tapering Cymbalta and stopping Buspar Will initiate Venlafaxine 37.5 mg PO QD as Cymbalta taper finishes Discussed potential withdrawal side effects during transition Will also place referral to Psychology to start therapy/ counseling services Follow up in 4-6 weeks to discuss response to new medication and effects on mood

## 2022-07-24 NOTE — Assessment & Plan Note (Signed)
Chronic, historic condition Reports mild improvements in mood since she started tapering Cymbalta  Will finish taper and start Venlafaxine 37.5 mg PO QD to assist with depression and anxiety Referral placed for psychology services to augment medication management Recommend she continue Propanolol for now- will try to replace with another agent after we get controller medication settled and stable Follow up in 4-6 weeks to assess response to Venlafaxine

## 2022-07-24 NOTE — Progress Notes (Unsigned)
I, [name], LAT, ATC acting as a scribe for Pam Graham, MD.  [name] is a 46 y.o. female who presents to [facility] at [facility] today for cont'd bilat knee pain and bilat Gelsyn injections. Pt was last seen by Dr. Denyse Nguyen on 07/09/22 and she was referred back to [facility]. Today, pt reports she had a visit w/ PT yesterday. Pt wants to pursue bilat Gelsyn injections.   Dx imaging: 07/09/22 R & L knee XR 11/10/21 L-spine XR 10/19/20 R hip & R knee XR 08/25/19 R & L knee XR 05/11/19 L-spine XR 01/02/19 L-spine CT 08/27/18 R & L knee XR  Pertinent review of systems: No fevers or chills  Relevant historical information: Migraine.  History of DVT.   Exam:  BP 120/68   Pulse 70   Ht 5\' 4"  (1.626 m)   Wt (!) 316 lb (143.3 kg)   LMP 11/07/2012   SpO2 98%   BMI 54.24 kg/m  General: Well Developed, well nourished, and in no acute distress.   MSK: Right knee moderate effusion decreased range of motion. Left knee mild effusion decreased range of motion.  Mild antalgic gait.    Lab and Radiology Results  Gelsyn injection bilateral knees 1/3 Procedure: Real-time Ultrasound Guided Injection of right knee superior lateral patellar space Device: Philips Affiniti 50G Images permanently stored and available for review in PACS Verbal informed consent obtained.  Discussed risks and benefits of procedure. Warned about infection, bleeding, damage to structures among others. Patient expresses understanding and agreement Time-out conducted.   Noted no overlying erythema, induration, or other signs of local infection.   Skin prepped in a sterile fashion.   Local anesthesia: Topical Ethyl chloride.   With sterile technique and under real time ultrasound guidance: Gelsyn 2 mL injected into knee joint. Fluid seen entering the joint capsule.   Completed without difficulty   Spinal needle used Advised to call if fevers/chills, erythema, induration, drainage, or  persistent bleeding.   Images permanently stored and available for review in the ultrasound unit.  Impression: Technically successful ultrasound guided injection.    Procedure: Real-time Ultrasound Guided Injection of left knee superior lateral patellar space Device: Philips Affiniti 50G Images permanently stored and available for review in PACS Verbal informed consent obtained.  Discussed risks and benefits of procedure. Warned about infection, bleeding, damage to structures among others. Patient expresses understanding and agreement Time-out conducted.   Noted no overlying erythema, induration, or other signs of local infection.   Skin prepped in a sterile fashion.   Local anesthesia: Topical Ethyl chloride.   With sterile technique and under real time ultrasound guidance: Gelsyn 2 mL injected into knee joint. Fluid seen entering the joint capsule.   Completed without difficulty   Spinal needle used Advised to call if fevers/chills, erythema, induration, drainage, or persistent bleeding.   Images permanently stored and available for review in the ultrasound unit.  Impression: Technically successful ultrasound guided injection.    Lot number: 01/05/[id number] for both injections     Assessment and Plan: 46 y.o. female with bilateral knee pain due to DJD.  This is a chronic issue with an acute exacerbation.  She last had hyaluronic acid injections May 2022.  Plan for repeat injection starting today.  Recheck in 1 week for gel shot #2 of 3.   PDMP not reviewed this encounter. Orders Placed This Encounter  Procedures   June 2022 LIMITED JOINT SPACE STRUCTURES LOW BILAT(NO LINKED CHARGES)  Order Specific Question:   Reason for Exam (SYMPTOM  OR DIAGNOSIS REQUIRED)    Answer:   bilateral knee pain    Order Specific Question:   Preferred imaging location?    Answer:   [facility]   Meds ordered this encounter  Medications   sodium hyaluronate (viscosup) (GELSYN-3)  intra-articular injection 33.6 mg     Discussed warning signs or symptoms. Please see discharge instructions. Patient expresses understanding.   The above documentation has been reviewed and is accurate and complete [name], M.D.

## 2022-07-24 NOTE — Progress Notes (Signed)
Virtual Visit via Video Note  I connected with [name] on 07/24/22 at 11:20 AM EDT by a video enabled telemedicine application and verified that I am speaking with the correct person using two identifiers.  Today's Provider: Talitha Givens, MHS, PA-C Introduced myself to the patient as a PA-C and provided education on APPs in clinical practice.   Location: Patient: At work Provider: At home, [place], [place]    I discussed the limitations of evaluation and management by telemedicine and the availability of in person appointments. The patient expressed understanding and agreed to proceed.   Chief Complaint  Patient presents with   Depression    History of Present Illness:  States she is surprisingly feeling better as she has been reducing the Cymbalta.  She has stopped the buspar as well She has not found a therapist yet- open to referral today  DEPRESSION Mood status: better Satisfied with current treatment?:  appears to have improvement in mood  Symptom severity: moderate  Duration of current treatment : chronic Side effects: no Medication compliance: excellent compliance Psychotherapy/counseling:  would like referral today    Previous psychiatric medications: cymbalta Depressed mood: yes Anxious mood: no Anhedonia: yes Significant weight loss or gain: yes Insomnia: yes hard to fall asleep also has sleep apnea  Fatigue: yes Feelings of worthlessness or guilt: yes some guilt over weight  Impaired concentration/indecisiveness: no  Suicidal ideations: no Hopelessness: no Crying spells: yes seems to happen more with being overwhelmed  Reports job stress from having to go to several different schools than she is used to - has still felt better with lowered dose of Cymbalta      07/24/2022   11:05 AM 07/09/2022   11:26 AM 01/03/2022    1:51 PM 11/22/2021    1:49 PM 11/11/2020    8:05 AM  Depression screen PHQ 2/9  Decreased Interest 1 1 0 0 0  Down, Depressed,  Hopeless 1 1 0 0 0  PHQ - 2 Score 2 2 0 0 0  Altered sleeping 2 3 1 1 1   Tired, decreased energy 2 2 2 1 2   Change in appetite 3 3 1 1 1   Feeling bad or failure about yourself  1 1 0 0 0  Trouble concentrating 1 1 0 1 1  Moving slowly or fidgety/restless 0 0 1 0 0  Suicidal thoughts 0 0 0 0 0  PHQ-9 Score 11 12 5 4 5   Difficult doing work/chores Somewhat difficult Somewhat difficult   Not difficult at all      Observations/Objective:  Due to the nature of the virtual visit, physical exam and observations are limited. Able to obtain the following observations:  Alert, oriented, Appears comfortable, in no acute distress.  No scleral injection, no appreciated hoarseness, tachypnea, wheeze or strider. Able to maintain conversation without visible strain.  No cough appreciated during visit.    Assessment and Plan:  Problem List Items Addressed This Visit       Other   Major depression, recurrent, chronic (HCC)    Chronic, historic condition Reports mild improvement in mood since she has been tapering Cymbalta and stopping Buspar Will initiate Venlafaxine 37.5 mg PO QD as Cymbalta taper finishes Discussed potential withdrawal side effects during transition Will also place referral to Psychology to start therapy/ counseling services Follow up in 4-6 weeks to discuss response to new medication and effects on mood        Relevant Medications   venlafaxine XR [facility]  XR) 37.5 MG 24 hr capsule   Anxiety and depression - Primary   Relevant Medications   venlafaxine XR (EFFEXOR XR) 37.5 MG 24 hr capsule   Other Relevant Orders   Ambulatory referral to Psychology   Situational anxiety    Chronic, historic condition Reports mild improvements in mood since she started tapering Cymbalta  Will finish taper and start Venlafaxine 37.5 mg PO QD to assist with depression and anxiety Referral placed for psychology services to augment medication management Recommend she continue  Propanolol for now- will try to replace with another agent after we get controller medication settled and stable Follow up in 4-6 weeks to assess response to Venlafaxine       Relevant Medications   venlafaxine XR (EFFEXOR XR) 37.5 MG 24 hr capsule    Follow Up Instructions:    I discussed the assessment and treatment plan with the patient. The patient was provided an opportunity to ask questions and all were answered. The patient agreed with the plan and demonstrated an understanding of the instructions.   The patient was advised to call back or seek an in-person evaluation if the symptoms worsen or if the condition fails to improve as anticipated.  I provided 17 minutes of non-face-to-face time during this encounter.   Return in about 6 weeks (around 09/04/2022) for Depression, anxiety (virtual okay) .   I, Reneisha Stilley E Darely Becknell, PA-C, have reviewed all documentation for this visit. The documentation on 07/24/22 for the exam, diagnosis, procedures, and orders are all accurate and complete.   Talitha Givens, MHS, PA-C [facility]

## 2022-07-24 NOTE — Patient Instructions (Signed)
Thank you for coming in today.   You received an injection today. Seek immediate medical attention if the joint becomes red, extremely painful, or is oozing fluid.   Schedule you 2nd Gelsyn injection for next week and the 3rd for the week following that.

## 2022-08-02 ENCOUNTER — Ambulatory Visit: Payer: Self-pay

## 2022-08-02 ENCOUNTER — Ambulatory Visit (INDEPENDENT_AMBULATORY_CARE_PROVIDER_SITE_OTHER): Payer: BC Managed Care – PPO | Admitting: Family Medicine

## 2022-08-02 DIAGNOSIS — G8929 Other chronic pain: Secondary | ICD-10-CM | POA: Diagnosis not present

## 2022-08-02 DIAGNOSIS — M25561 Pain in right knee: Secondary | ICD-10-CM | POA: Diagnosis not present

## 2022-08-02 DIAGNOSIS — M1712 Unilateral primary osteoarthritis, left knee: Secondary | ICD-10-CM | POA: Diagnosis not present

## 2022-08-02 DIAGNOSIS — M25562 Pain in left knee: Secondary | ICD-10-CM

## 2022-08-02 MED ORDER — SODIUM HYALURONATE (VISCOSUP) 16.8 MG/2ML IX SOSY
33.6000 mg | PREFILLED_SYRINGE | Freq: Once | INTRA_ARTICULAR | Status: AC
  Administered 2022-08-02: 33.6 mg via INTRA_ARTICULAR

## 2022-08-02 NOTE — Patient Instructions (Signed)
Thank you for coming in today.   You received an injection today. Seek immediate medical attention if the joint becomes red, extremely painful, or is oozing fluid.   We will see you next week for the 3rd Gelsyn injections   

## 2022-08-02 NOTE — Progress Notes (Signed)
[name] presents to clinic today for Gelsyn injection bilateral knees 2/3  Procedure: Real-time Ultrasound Guided Injection of left knee superior lateral patellar space Device: Philips Affiniti 50G Images permanently stored and available for review in PACS Verbal informed consent obtained.  Discussed risks and benefits of procedure. Warned about infection, bleeding, damage to structures among others. Patient expresses understanding and agreement Time-out conducted.   Noted no overlying erythema, induration, or other signs of local infection.   Skin prepped in a sterile fashion.   Local anesthesia: Topical Ethyl chloride.   With sterile technique and under real time ultrasound guidance: Gelsyn 2 mL injected into knee joint. Fluid seen entering the joint capsule.   Completed without difficulty   Spinal needle used Advised to call if fevers/chills, erythema, induration, drainage, or persistent bleeding.   Images permanently stored and available for review in the ultrasound unit.  Impression: Technically successful ultrasound guided injection.    Procedure: Real-time Ultrasound Guided Injection of right knee superior lateral patellar space Device: Philips Affiniti 50G Images permanently stored and available for review in PACS Verbal informed consent obtained.  Discussed risks and benefits of procedure. Warned about infection, bleeding, damage to structures among others. Patient expresses understanding and agreement Time-out conducted.   Noted no overlying erythema, induration, or other signs of local infection.   Skin prepped in a sterile fashion.   Local anesthesia: Topical Ethyl chloride.   With sterile technique and under real time ultrasound guidance: Gelsyn 2 mL injected into knee joint. Fluid seen entering the joint capsule.   Completed without difficulty   Spinal needle used Advised to call if fevers/chills, erythema, induration, drainage, or persistent bleeding.   Images  permanently stored and available for review in the ultrasound unit.  Impression: Technically successful ultrasound guided injection.  Lot number: [id number] for both injections.  Return in 1 week for Gelsyn injection bilateral knees 3/3

## 2022-08-03 ENCOUNTER — Other Ambulatory Visit: Payer: Self-pay | Admitting: Physician Assistant

## 2022-08-03 ENCOUNTER — Encounter: Payer: Self-pay | Admitting: Physician Assistant

## 2022-08-03 DIAGNOSIS — G4709 Other insomnia: Secondary | ICD-10-CM

## 2022-08-03 MED ORDER — TRAZODONE HCL 100 MG PO TABS: 100.0000 mg | ORAL_TABLET | Freq: Every day | ORAL | 0 refills | Status: DC

## 2022-08-08 ENCOUNTER — Ambulatory Visit (INDEPENDENT_AMBULATORY_CARE_PROVIDER_SITE_OTHER): Payer: BC Managed Care – PPO | Admitting: Family Medicine

## 2022-08-08 ENCOUNTER — Ambulatory Visit (INDEPENDENT_AMBULATORY_CARE_PROVIDER_SITE_OTHER): Payer: BC Managed Care – PPO | Admitting: Professional

## 2022-08-08 ENCOUNTER — Encounter: Payer: Self-pay | Admitting: Professional

## 2022-08-08 ENCOUNTER — Ambulatory Visit: Payer: Self-pay

## 2022-08-08 DIAGNOSIS — M25562 Pain in left knee: Secondary | ICD-10-CM | POA: Diagnosis not present

## 2022-08-08 DIAGNOSIS — F339 Major depressive disorder, recurrent, unspecified: Secondary | ICD-10-CM | POA: Diagnosis not present

## 2022-08-08 DIAGNOSIS — G8929 Other chronic pain: Secondary | ICD-10-CM

## 2022-08-08 DIAGNOSIS — M1712 Unilateral primary osteoarthritis, left knee: Secondary | ICD-10-CM

## 2022-08-08 DIAGNOSIS — M25561 Pain in right knee: Secondary | ICD-10-CM | POA: Diagnosis not present

## 2022-08-08 MED ORDER — CELECOXIB 100 MG PO CAPS: 100.0000 mg | ORAL_CAPSULE | Freq: Two times a day (BID) | ORAL | 1 refills | Status: DC | PRN

## 2022-08-08 MED ORDER — SODIUM HYALURONATE (VISCOSUP) 16.8 MG/2ML IX SOSY
33.6000 mg | PREFILLED_SYRINGE | Freq: Once | INTRA_ARTICULAR | Status: AC
  Administered 2022-08-08: 33.6 mg via INTRA_ARTICULAR
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
## 2022-08-08 NOTE — Patient Instructions (Addendum)
Thank you for coming in today.   You received an injection today. Seek immediate medical attention if the joint becomes red, extremely painful, or is oozing fluid.   Check back as needed  Use celebrex as needed.   Use compression sleeves on your knees for the bad days.   Enjoy the beach.

## 2022-08-08 NOTE — Progress Notes (Signed)
° ° ° ° ° ° ° ° ° ° ° ° ° ° °  Letesha Klecker, LCMHC °

## 2022-08-09 NOTE — Progress Notes (Signed)
[name] presents to clinic today for Gelsyn injection bilateral knees 3/3  Procedure: Real-time Ultrasound Guided Injection of right knee superior lateral patellar space Device: Philips Affiniti 50G Images permanently stored and available for review in PACS Verbal informed consent obtained.  Discussed risks and benefits of procedure. Warned about infection, bleeding, damage to structures among others. Patient expresses understanding and agreement Time-out conducted.   Noted no overlying erythema, induration, or other signs of local infection.   Skin prepped in a sterile fashion.   Local anesthesia: Topical Ethyl chloride.   With sterile technique and under real time ultrasound guidance: Gelsyn 2 mL injected into knee joint. Fluid seen entering the joint capsule.   Completed without difficulty   Spinal needle used Advised to call if fevers/chills, erythema, induration, drainage, or persistent bleeding.   Images permanently stored and available for review in the ultrasound unit.  Impression: Technically successful ultrasound guided injection.   Procedure: Real-time Ultrasound Guided Injection of left knee superior lateral patellar space Device: Philips Affiniti 50G Images permanently stored and available for review in PACS Verbal informed consent obtained.  Discussed risks and benefits of procedure. Warned about infection, bleeding, damage to structures among others. Patient expresses understanding and agreement Time-out conducted.   Noted no overlying erythema, induration, or other signs of local infection.   Skin prepped in a sterile fashion.   Local anesthesia: Topical Ethyl chloride.   With sterile technique and under real time ultrasound guidance: Gelsyn 2 mL injected into knee joint. Fluid seen entering the joint capsule.   Completed without difficulty   Spinal needle usedAdvised to call if fevers/chills, erythema, induration, drainage, or persistent bleeding.   Images permanently  stored and available for review in the ultrasound unit.  Impression: Technically successful ultrasound guided injection.   Lot number: [id number] bilateral injections Return as needed. Celebrex also prescribed to use intermittently

## 2022-08-29 NOTE — Progress Notes (Unsigned)
Established Patient Office Visit  Name: [name]   MRN: [id number]    DOB: 1976/02/18   Date:08/29/2022  Today's Provider: Jacquelin HawkingErin Tallin Hart, MHS, PA-C Introduced myself to the patient as a PA-C and provided education on APPs in clinical practice.         Subjective  Chief Complaint  No chief complaint on file.   HPI   Patient Active Problem List   Diagnosis Date Noted   Hiatal hernia 05/11/2022   RLQ abdominal pain 05/11/2022   Deep vein thrombosis (DVT) of popliteal vein of right lower extremity (HCC) 01/11/2022   Hyponatremia 01/03/2022   Bilateral lower extremity edema 11/22/2021   Chronic pain of right knee 01/11/2021   Primary osteoarthritis of right knee 01/11/2021   Vitamin D deficiency 10/06/2020   Prediabetes 10/06/2020   Anxiety, with emotional eating 10/06/2020   Mixed hyperlipidemia 10/06/2020   Situational anxiety 09/08/2020   Migraine without aura and without status migrainosus, not intractable 09/08/2020   Mild intermittent asthma without complication 05/24/2020   OSA on CPAP 07/09/2019   Degenerative scoliosis in adult patient 05/11/2019   Anxiety and depression 04/08/2019   Fibromyalgia 04/08/2019   Primary osteoarthritis of both knees 09/17/2018   Primary osteoarthritis of both hands 09/17/2018   DDD (degenerative disc disease), lumbar 08/27/2018   Multiple food allergies 07/28/2018   Iron deficiency anemia 02/07/2018   Prolapsed lumbar disc 12/27/2017   Major depression, recurrent, chronic (HCC) 01/22/2011   Morbid obesity with BMI of 50.0-59.9, adult (HCC) 02/13/2010    Past Surgical History:  Procedure Laterality Date   ABDOMINAL HYSTERECTOMY     ACHILLES TENDON LENGTHENING Bilateral 1989   ANKLE GANGLION CYST EXCISION  02/2012   left ankle   ANTERIOR LAT LUMBAR FUSION Right 05/11/2019   Procedure: ANTERIOR LATERAL LUMBAR INTERBODY FUSION, LATERAL INSTRUMENTATION, RIGHT LUMBAR TWO- LUMBAR THREE;  Surgeon: Tressie StalkerJenkins, Jeffrey, MD;   [facility];  Service: Neurosurgery;  Laterality: Right;  ANTERIOR LATERAL LUMBAR INTERBODY FUSION, LATERAL INSTRUMENTATION, RIGHT LUMBAR TWO- LUMBAR THREE   BIOPSY  03/16/2019   Procedure: BIOPSY;  Surgeon: Corbin Adeourk, Robert M, MD;  [facility];  Service: Endoscopy;;  Gastric    BIOPSY  06/28/2022   Procedure: BIOPSY;  Surgeon: Corbin Adeourk, Robert M, MD;  [facility]: [facility];  Service: Endoscopy;;   COLONOSCOPY WITH PROPOFOL N/A 03/16/2019   Dr. Jena Gaussourk: Normal terminal ileum, 15 cm.  Normal colon.  Next colonoscopy 10 years   COLONOSCOPY WITH PROPOFOL N/A 06/28/2022   Procedure: COLONOSCOPY WITH PROPOFOL;  Surgeon: Corbin Adeourk, Robert M, MD;  [facility];  Service: Endoscopy;  Laterality: N/A;  W/ ILEOSCOPY, 11:00am, pt knows to arrive at 8:15   DILATION AND CURETTAGE OF UTERUS  2003   ENTEROSCOPY N/A 04/02/2019   Dr. Jena Gaussourk: bulbar erosions, jejunal ulcerations, attempted biopsy but scope fell backwards and lesions cannot be reidentified.  No evidence of ampullary lesion or mass.   ESOPHAGOGASTRODUODENOSCOPY (EGD) WITH PROPOFOL N/A 03/16/2019   Dr. Jena Gaussourk: Large hiatal hernia, Cameron/antral erosions, duodenal erosions.  Gastric biopsy showed reactive gastropathy, mild chronic gastritis, negative for H. pylori   ESOPHAGOGASTRODUODENOSCOPY (EGD) WITH PROPOFOL N/A 06/28/2022   Procedure: ESOPHAGOGASTRODUODENOSCOPY (EGD) WITH PROPOFOL;  Surgeon: Corbin Adeourk, Robert M, MD;  [facility];  Service: Endoscopy;  Laterality: N/A;   FEET SURGERY Bilateral 1990   INSERTION OF BONE GRAFT IN FEET   GIVENS CAPSULE STUDY N/A 03/16/2019   Procedure: GIVENS CAPSULE  STUDY;  Surgeon: Corbin Ade, MD;  [facility];  Service: Endoscopy;  Laterality: N/A;   LAPAROSCOPIC GASTRIC BANDING  10/17/2010   Dr Gaynelle Adu; [facility]   LAPAROSCOPIC REPAIR AND REMOVAL OF GASTRIC BAND  01/2018   LIPOMA EXCISION  02/2006   abdomen   LUMBAR LAMINECTOMY     SPINE SURGERY N/A    Phreesia  11/10/2020   TRANSTHORACIC ECHOCARDIOGRAM  11/2021   Normal LV size and function.  EF 60 to 65%.  Mild LVH.  GR 1 DD.  No MR.  Normal LA, RA size.  Normal RV size and function.  Unable to assess RVP.  Normal estimated RAP.  Normal valves.    Family History  Problem Relation Age of Onset   Hypertension Mother    Hyperlipidemia Mother    Diabetes Mother        pre-diabetes   Allergic rhinitis Mother    Asthma Mother    Urticaria Mother    Hypertension Father    Hyperlipidemia Father    Heart disease Father    Diabetes Father    Cancer Father        skin   Allergic rhinitis Father    Asthma Father    Urticaria Father    Diabetes Sister    Hypertension Sister    Allergic rhinitis Sister    Asthma Sister    Urticaria Sister    Pleurisy Son    Migraines Son    Heart disease Maternal Grandmother        V. Fib arrest   Cancer Maternal Grandfather        lung and skin   Colon cancer Maternal Grandfather    Cancer Paternal Grandfather        small bowel cancer and skin   Colon polyps Other        aunt and uncle   Eczema Neg Hx     Social History   Tobacco Use   Smoking status: Never   Smokeless tobacco: Never  Substance Use Topics   Alcohol use: No     Current Outpatient Medications:    albuterol (VENTOLIN HFA) 108 (90 Base) MCG/ACT inhaler, Inhale 2 puffs into the lungs every 6 (six) hours as needed for wheezing., Disp: , Rfl:    aspirin EC 81 MG tablet, Take 1 tablet (81 mg total) by mouth daily. Swallow whole. (Patient taking differently: Take 81 mg by mouth every other day. Swallow whole.), Disp: 90 tablet, Rfl: 3   atorvastatin (LIPITOR) 40 MG tablet, Take 1 tablet (40 mg total) by mouth daily. (Patient taking differently: Take 40 mg by mouth every evening.), Disp: 90 tablet, Rfl: 3   butalbital-acetaminophen-caffeine (FIORICET) 50-325-40 MG tablet, Take 1 tablet by mouth every 6 (six) hours as needed for headache., Disp: 30 tablet, Rfl: 0   celecoxib (CELEBREX)  100 MG capsule, Take 1 capsule (100 mg total) by mouth 2 (two) times daily as needed for moderate pain., Disp: 60 capsule, Rfl: 1   EPINEPHrine 0.3 mg/0.3 mL IJ SOAJ injection, Inject 0.3 mg into the muscle as needed for anaphylaxis., Disp: , Rfl:    furosemide (LASIX) 20 MG tablet, Take 20 mg by mouth daily., Disp: , Rfl:    gabapentin (NEURONTIN) 300 MG capsule, Take 1 capsule (300 mg total) by mouth 3 (three) times daily. (Patient taking differently: Take 300 mg by mouth 2 (two) times daily.), Disp: 270 capsule, Rfl: 3   loratadine (CLARITIN) 10 MG tablet, Take  10 mg by mouth daily., Disp: , Rfl:    OVER THE COUNTER MEDICATION, Take 100 mg by mouth 2 (two) times daily as needed (pain). CBD, Disp: , Rfl:    Probiotic Product (PROBIOTIC BLEND PO), Take 1 capsule by mouth daily., Disp: , Rfl:    propranolol (INDERAL) 20 MG tablet, Take 1 tablet (20 mg total) by mouth 3 (three) times daily as needed (anxiety)., Disp: 90 tablet, Rfl: 3   traZODone (DESYREL) 100 MG tablet, Take 1 tablet (100 mg total) by mouth at bedtime., Disp: 90 tablet, Rfl: 0   venlafaxine XR (EFFEXOR XR) 37.5 MG 24 hr capsule, Take 1 capsule (37.5 mg total) by mouth daily with breakfast., Disp: 30 capsule, Rfl: 1   Vitamin D, Ergocalciferol, (DRISDOL) 1.25 MG (50000 UNIT) CAPS capsule, TAKE 1 CAPSULE BY MOUTH 2 TIMES A WEEK, Disp: 24 capsule, Rfl: 0  Allergies  Allergen Reactions   Cashew Nut Oil Anaphylaxis   Cauliflower [Brassica Oleracea] Anaphylaxis   Whey Anaphylaxis   Augmentin [Amoxicillin-Pot Clavulanate] Diarrhea and Nausea And Vomiting    Has patient had a PCN reaction causing immediate rash, facial/tongue/throat swelling, SOB or lightheadedness with hypotension: No Has patient had a PCN reaction causing severe rash involving mucus membranes or skin necrosis: No Has patient had a PCN reaction that required hospitalization: No Has patient had a PCN reaction occurring within the last 10 years: Unknown If all of the  above answers are "NO", then may proceed with Cephalosporin use.    Beeswax Itching and Swelling   Honey Itching and Swelling   Orange Fruit [Citrus] Other (See Comments)    ALL CITRUS FRUITS Migraines    Orange Oil Other (See Comments)   Wheat Bran Diarrhea   Bee Pollen Swelling   Gluten Meal Other (See Comments)    Severe GI upset   Milk-Related Compounds    Other     epidural steroid injection - severe headaches, high fever   Codeine Rash   Tree Extract Itching    Tee tree oil    I personally reviewed {Reviewed:14835} with the patient/caregiver today.   ROS    Objective  There were no vitals filed for this visit.  There is no height or weight on file to calculate BMI.  Physical Exam   Recent Results (from the past 2160 hour(s))  CBC with Differential/Platelet     Status: Abnormal   Collection Time: 06/26/22  3:10 PM  Result Value Ref Range   WBC 7.9 4.0 - 10.5 K/uL   RBC 4.34 3.87 - 5.11 MIL/uL   Hemoglobin 13.6 12.0 - 15.0 g/dL   HCT 44.0 34.7 - 42.5 %   MCV 92.4 80.0 - 100.0 fL   MCH 31.3 26.0 - 34.0 pg   MCHC 33.9 30.0 - 36.0 g/dL   RDW 95.6 38.7 - 56.4 %   Platelets 294 150 - 400 K/uL   nRBC 0.0 0.0 - 0.2 %   Neutrophils Relative % 52 %   Neutro Abs 4.2 1.7 - 7.7 K/uL   Lymphocytes Relative 30 %   Lymphs Abs 2.4 0.7 - 4.0 K/uL   Monocytes Relative 8 %   Monocytes Absolute 0.6 0.1 - 1.0 K/uL   Eosinophils Relative 8 %   Eosinophils Absolute 0.6 (H) 0.0 - 0.5 K/uL   Basophils Relative 1 %   Basophils Absolute 0.1 0.0 - 0.1 K/uL   Immature Granulocytes 1 %   Abs Immature Granulocytes 0.04 0.00 - 0.07 K/uL  Comment: Performed at [facility], [address] [place], [place] [address]  Basic metabolic panel     Status: Abnormal   Collection Time: 06/26/22  3:10 PM  Result Value Ref Range   Sodium 137 135 - 145 mmol/L   Potassium 3.8 3.5 - 5.1 mmol/L   Chloride 103 98 - 111 mmol/L   CO2 24 22 - 32 mmol/L   Glucose, Bld 97 70 - 99 mg/dL     Comment: Glucose reference range applies only to samples taken after fasting for at least 8 hours.   BUN 10 6 - 20 mg/dL   Creatinine, Ser 6.76 (H) 0.44 - 1.00 mg/dL   Calcium 8.9 8.9 - 19.5 mg/dL   GFR, Estimated >09 >32 mL/min    Comment: (NOTE) Calculated using the CKD-EPI Creatinine Equation (2021)    Anion gap 10 5 - 15    Comment: Performed at [facility], [address] [place], [place] [address]  Surgical pathology     Status: None   Collection Time: 06/28/22 10:19 AM  Result Value Ref Range   SURGICAL PATHOLOGY      SURGICAL PATHOLOGY CASE: [phone] PATIENT: Nicoletta Holsinger Surgical Pathology Report     Clinical History: RLQ abd pain, abnormal CT stomach, duodenum jejunum     FINAL MICROSCOPIC DIAGNOSIS:  A. DUODENUM, BIOPSY: - Duodenal mucosa with normal villous architecture. - No villous atrophy or increased intraepithelial lymphocytes.    GROSS DESCRIPTION:  Received in formalin are tan, soft tissue fragments that are submitted in toto. Number: 4 size: Range from 0.2 to 0.3 cm blocks: 1 Lovey Newcomer 06/28/2022)    Final Diagnosis performed by Jimmy Picket, MD.   Electronically signed 06/29/2022 Technical component performed at [facility], [address] [place], [place] [address].  Professional component performed at [employer], [address] [place], [place] [address].  Immunohistochemistry Technical component (if applicable) was performed at [facility]. [address], [place], [place].   IMMUNOHISTOCHEMISTRY DISCLAIMER (if applicable): Some of these immunohistochemical stains may have been developed and the performance characteristics determine by [facility]. Some may not have been cleared or approved by the U.S. Food and Drug Administration. The FDA has determined that such clearance or approval is not necessary. This test is used for clinical purposes. It  should not be regarded as investigational or for research. This laboratory is certified under the Clinical Laboratory Improvement Amendments of 1988 (CLIA-88) as qualified to perform high complexity clinical laboratory testing.  The controls stained appropriately.      PHQ2/9:    07/24/2022   11:05 AM 07/09/2022   11:26 AM 01/03/2022    1:51 PM 11/22/2021    1:49 PM 11/11/2020    8:05 AM  Depression screen PHQ 2/9  Decreased Interest 1 1 0 0 0  Down, Depressed, Hopeless 1 1 0 0 0  PHQ - 2 Score 2 2 0 0 0  Altered sleeping 2 3 1 1 1   Tired, decreased energy 2 2 2 1 2   Change in appetite 3 3 1 1 1   Feeling bad or failure about yourself  1 1 0 0 0  Trouble concentrating 1 1 0 1 1  Moving slowly or fidgety/restless 0 0 1 0 0  Suicidal thoughts 0 0 0 0 0  PHQ-9 Score 11 12 5 4 5   Difficult doing work/chores Somewhat difficult Somewhat difficult   Not difficult at all  Fall Risk:    07/24/2022   11:04 AM 07/09/2022   10:46 AM 11/22/2021    1:49 PM 11/11/2020    8:04 AM 09/08/2020    3:07 PM  Fall Risk   Falls in the past year? 0 1 0 0 0  Number falls in past yr: 0 0 0 0 0  Injury with Fall? 0 0 0 0 0  Risk for fall due to : No Fall Risks No Fall Risks No Fall Risks No Fall Risks No Fall Risks  Follow up Falls evaluation completed Falls evaluation completed Falls evaluation completed Falls evaluation completed Falls evaluation completed      Functional Status Survey:      Assessment & Plan

## 2022-08-30 ENCOUNTER — Encounter: Payer: Self-pay | Admitting: Physician Assistant

## 2022-08-30 ENCOUNTER — Other Ambulatory Visit: Payer: Self-pay | Admitting: Physician Assistant

## 2022-08-30 ENCOUNTER — Telehealth (INDEPENDENT_AMBULATORY_CARE_PROVIDER_SITE_OTHER): Payer: BC Managed Care – PPO | Admitting: Physician Assistant

## 2022-08-30 VITALS — Wt 308.0 lb

## 2022-08-30 DIAGNOSIS — F339 Major depressive disorder, recurrent, unspecified: Secondary | ICD-10-CM

## 2022-08-30 DIAGNOSIS — F418 Other specified anxiety disorders: Secondary | ICD-10-CM

## 2022-08-30 DIAGNOSIS — R600 Localized edema: Secondary | ICD-10-CM

## 2022-08-30 MED ORDER — HYDROCHLOROTHIAZIDE 25 MG PO TABS: 25.0000 mg | ORAL_TABLET | Freq: Every day | ORAL | 1 refills | Status: DC

## 2022-08-30 NOTE — Progress Notes (Signed)
Virtual Visit via Video Note  I connected with [name] on 08/30/22 at  9:00 AM EDT by a video enabled telemedicine application and verified that I am speaking with the correct person using two identifiers.  Today's Provider: Talitha Givens, MHS, PA-C Introduced myself to the patient as a PA-C and provided education on APPs in clinical practice.    Location: Patient: at work Provider: [place], [place]    I discussed the limitations of evaluation and management by telemedicine and the availability of in person appointments. The patient expressed understanding and agreed to proceed.  Chief Complaint  Patient presents with   Depression    Pt states she stopped the Effexor due to having intense suicidal thoughts. States she saw her weight doctor since then and she was prescribed Lamotrigine and 10 tablets of Prozac. States she finished the Prozac this morning as it was a taper from coming off of the Effexor and on to the Lamotrigine.    Anxiety    History of Present Illness:    Reports she is much better today than the previous time we discussed her depression and anxiety She states she feels much better after the changes that were made   She reports she was having increased SI while taking Effexor She has stopped taking Effexor and her Healthy Weight provider  (Dr. Juleen China) has started her on Prozac every other day to taper She is now on Lamotrigine - thinks its 25 mg per day  She has follow up with healthy weight in Dec -Discussed release for health weight charts to assist with coordination of care  She has met with a therapist from behavioral health and has a second apt coming up   She reports she is also having swelling in her ankles  She has been using Lasix and compression stockings Lasix was prescribed by Cardiology - tried HCTZ 25 mg but this was not effective so went to Lasix   She reports PT for knees is going much better  She reports her  Fibromyalgia feels better- seems to be using a lotion with CBD for relief.       08/30/2022    9:09 AM 07/24/2022   11:05 AM 07/09/2022   11:26 AM 01/03/2022    1:51 PM 11/22/2021    1:49 PM  Depression screen PHQ 2/9  Decreased Interest _0 0 0  Down, Depressed, Hopeless 0 1 1 0 0  PHQ - 2 Score _1 0 0  Altered sleeping _2 Tired, decreased energy _3 Change in appetite _4 Feeling bad or failure about yourself  _5 0 0  Trouble concentrating _6 0 1  Moving slowly or fidgety/restless 0 0 0 1 0  Suicidal thoughts 0 0 0 0 0  PHQ-9 Score _7 Difficult doing work/chores Somewhat difficult Somewhat difficult Somewhat difficult        08/30/2022    9:10 AM 07/24/2022   11:06 AM 07/09/2022   11:27 AM 01/03/2022    1:52 PM  GAD 7 : Generalized Anxiety Score  Nervous, Anxious, on Edge _8 0  Control/stop worrying 1 0 1 0  Worry too much - different things _9 0  Trouble relaxing 0 _10 Restless 1 0 0 0  Easily annoyed or irritable 1 0 0  1  Afraid - awful might happen 0 0 1 0  Total GAD 7 Score _0 Anxiety Difficulty Somewhat difficult Somewhat difficult Somewhat difficult Not difficult at all       Observations/Objective:   Due to the nature of the virtual visit, physical exam and observations are limited. Able to obtain the following observations:   Alert, oriented, x3  Appears comfortable, in no acute distress.  No scleral injection, no appreciated hoarseness, tachypnea, wheeze or strider. Able to maintain conversation without visible strain.     Assessment and Plan:   Problem List Items Addressed This Visit       Other   Bilateral lower extremity edema (Chronic)    Chronic, ongoing concern Reports she was previously on HCTZ 25 mg PO QD for swelling after her DVT but was then placed on Furosemide 20 mg PO QD States Furosemide feels like it is draining and drying her out  She has been using compression stockings  and has noticed improvement in the swelling and would like to try returning to the HCTZ 25 mg PO QD to see if this will help manage swelling Will dc Furosemide and replace with HCTZ 25 mg PO QD and if symptoms are still well controlled we can consider reducing to 12.5 mg  Recommend she continue to use compression stockings and ambulate as much as possible to help with circulation.  Follow up in about 2 months to assess progress or sooner if concerns arise.       Relevant Medications   hydrochlorothiazide (HYDRODIURIL) 25 MG tablet   Major depression, recurrent, chronic (HCC) - Primary    Chronic, historic condition Reports she has stopped taking Venlafaxine due to SE- had suicidal ideations so this has been added to her allergy list She is now taking Lamotrigine 25 mg PO QD from her Weight management provider- she is amenable to [place] managing this as she reports significant improvement in mood since making switch Recommend we continue this regimen since she is having such a positive result       Situational anxiety    Chronic, historic condition  Reports improvement in mood symptoms after change to Lamotrigine 25 mg PO QD  Recommend we continue this regimen and will titrate dose as needed to control symptoms Follow up in about 2 months to assess progress or sooner if concerns arise      Follow Up Instructions:    I discussed the assessment and treatment plan with the patient. The patient was provided an opportunity to ask questions and all were answered. The patient agreed with the plan and demonstrated an understanding of the instructions.   The patient was advised to call back or seek an in-person evaluation if the symptoms worsen or if the condition fails to improve as anticipated.  I provided 22 minutes of non-face-to-face time during this encounter.  Return in about 10 weeks (around 11/08/2022) for anxiety, depression .   I, Lacara Dunsworth E Emila Steinhauser, PA-C, have reviewed all documentation  for this visit. The documentation on 08/30/22 for the exam, diagnosis, procedures, and orders are all accurate and complete.   Talitha Givens, MHS, PA-C [facility]

## 2022-08-30 NOTE — Assessment & Plan Note (Addendum)
Chronic, ongoing concern Reports she was previously on HCTZ 25 mg PO QD for swelling after her DVT but was then placed on Furosemide 20 mg PO QD States Furosemide feels like it is draining and drying her out  She has been using compression stockings and has noticed improvement in the swelling and would like to try returning to the HCTZ 25 mg PO QD to see if this will help manage swelling Will dc Furosemide and replace with HCTZ 25 mg PO QD and if symptoms are still well controlled we can consider reducing to 12.5 mg  Recommend she continue to use compression stockings and ambulate as much as possible to help with circulation.  Follow up in about 2 months to assess progress or sooner if concerns arise.

## 2022-08-30 NOTE — Assessment & Plan Note (Signed)
Chronic, historic condition  Reports improvement in mood symptoms after change to Lamotrigine 25 mg PO QD  Recommend we continue this regimen and will titrate dose as needed to control symptoms Follow up in about 2 months to assess progress or sooner if concerns arise

## 2022-08-30 NOTE — Assessment & Plan Note (Signed)
Chronic, historic condition Reports she has stopped taking Venlafaxine due to SE- had suicidal ideations so this has been added to her allergy list She is now taking Lamotrigine 25 mg PO QD from her Weight management provider- she is amenable to [place] managing this as she reports significant improvement in mood since making switch Recommend we continue this regimen since she is having such a positive result

## 2022-08-30 NOTE — Telephone Encounter (Signed)
Unable to refill per protocol, last refill by  provider 08/30/22 for 30 and 1 RF. E-Prescribing Status: Receipt confirmed by pharmacy (08/30/2022  9:41 AM EDT). Will refuse duplicate request.  Requested Prescriptions  Pending Prescriptions Disp Refills   hydrochlorothiazide (HYDRODIURIL) 25 MG tablet [Pharmacy Med Name: HYDROCHLOROTHIAZIDE 25MG TABLETS] 90 tablet     Sig: TAKE 1 TABLET(25 MG) BY MOUTH DAILY     Cardiovascular: Diuretics - Thiazide Failed - 08/30/2022  1:36 PM      Failed - Cr in normal range and within 180 days    Creat  Date Value Ref Range Status  08/27/2018 0.86 0.50 - 1.10 mg/dL Final   Creatinine, Ser  Date Value Ref Range Status  06/26/2022 1.03 (H) 0.44 - 1.00 mg/dL Final         Passed - K in normal range and within 180 days    Potassium  Date Value Ref Range Status  06/26/2022 3.8 3.5 - 5.1 mmol/L Final         Passed - Na in normal range and within 180 days    Sodium  Date Value Ref Range Status  06/26/2022 137 135 - 145 mmol/L Final  01/03/2022 141 134 - 144 mmol/L Final         Passed - Last BP in normal range    BP Readings from Last 1 Encounters:  07/24/22 120/68         Passed - Valid encounter within last 6 months    Recent Outpatient Visits           Today Major depression, recurrent, chronic ([place])   [facility] Mecum, Erin E, PA-C   1 month ago Anxiety and depression   [facility] Mecum, Erin E, PA-C   1 month ago Major depression, recurrent, chronic ([place])   [place], Erin E, PA-C   7 months ago Hyponatremia   [place] [name], [name], MD   9 months ago Edema, unspecified type   [place], MD       Future Appointments             In 2 months Mecum, Dani Gobble, PA-C [employer]

## 2022-08-31 ENCOUNTER — Ambulatory Visit: Payer: BC Managed Care – PPO | Admitting: Professional

## 2022-09-03 ENCOUNTER — Encounter: Payer: Self-pay | Admitting: Professional

## 2022-09-03 ENCOUNTER — Ambulatory Visit (INDEPENDENT_AMBULATORY_CARE_PROVIDER_SITE_OTHER): Payer: BC Managed Care – PPO | Admitting: Professional

## 2022-09-03 DIAGNOSIS — F419 Anxiety disorder, unspecified: Secondary | ICD-10-CM

## 2022-09-03 DIAGNOSIS — F339 Major depressive disorder, recurrent, unspecified: Secondary | ICD-10-CM | POA: Diagnosis not present

## 2022-09-03 NOTE — Progress Notes (Signed)
[facility] Counselor/Therapist Progress Note  Patient ID: [name], MRN: [id number],    Date: 09/03/2022  Time Spent: 58 minutes 1-159pm  Treatment Type: Individual Therapy  Risk Assessment: Danger to Self:  No Self-injurious Behavior: No Danger to Others: No  Subjective: This session was held via video teletherapy. The patient consented to video teletherapy and was located at her home during this session. She is aware it is the responsibility of the patient to secure confidentiality on her end of the session. The provider was in a private home office for the duration of this session.    The patient arrived late for her video appointment.   Issues addressed: 1-mood -much better since MD changed her medication -Effexor discontinued, used Prozac to help until new medication started -prescribed Lamictal 25mg  two tablets per day 08/08/2022 -she is never to take Effexor again per pt's MD    09/03/2022    1:08 PM 08/30/2022    9:09 AM  Depression screen PHQ 2/9  Decreased Interest 1 1  Down, Depressed, Hopeless 0 0  PHQ - 2 Score 1 1  Altered sleeping 2 2  Tired, decreased energy 1 2  Change in appetite 3 3  Feeling bad or failure about yourself  0 1  Trouble concentrating 1 1  Moving slowly or fidgety/restless 0 0  Suicidal thoughts 0 0  PHQ-9 Score 8 10  Difficult doing work/chores Somewhat difficult Somewhat difficult    2-treatment planning -pt and Clinician completed development of treatment plan -pt fully participated and agrees with her treatment plan as written  Treatment Plan Problems Addressed  Balancing Work and Family/Multiple Roles, Body Image Disturbance/Eating Disorders, Depression, Low Self-Esteem/Lack of Assertiveness Goals 1. Address and eliminate maladaptive thought processes that lead to anxious responses. Objective Verbalize anxiety symptoms as well as when, where, and how frequently they occur; describe attempts to resolve  anxiety. Target Date: 2023-09-03 Frequency: Biweekly  Progress: 0 Modality: individual  Related Interventions Explore with the client her current understanding of the external and internal factors that contribute to her anxiety (e.g., gender roles, social relationships/lack of support, socioeconomic status, trauma, brain chemistry, and hormones). Objective Identify precipitating events, thoughts, feelings, and reactions that are believed to contribute to anxiety. Target Date: 2023-09-03 Frequency: Biweekly  Progress: 0 Modality: individual  Related Interventions Educate the client on the relaxing, calming, and balancing benefits of practices such as yoga, meditation, and prayer; provide suggestions on how to get started (e.g., book, video, local gym class). Aid the client in devising a healthy diet and eating schedule, a pattern of adequate sleep and relaxation, as well as regular cardiovascular exercise. Objective Implement self-care strategies that serve to augment the positive effects of other interventions. Target Date: 2023-09-03 Frequency: Biweekly  Progress: 0 Modality: individual  Related Interventions Elicit the client's specific patterns of thought that contribute to anxious states; aid client in understanding the connection between the identified maladaptive thoughts and anxious feelings. Objective Acknowledge underlying irrational or illogical thought patterns that contribute to anxiety. Target Date: 2023-09-03 Frequency: Biweekly  Progress: 0 Modality: individual  Related Interventions Aid the client in seeing the connection between a focus on helping others or improving society and a reduction in anxiety (i.e., dwelling on maladaptive thoughts). Explore with the client how gender roles have been received and internalized, and in turn how they have contributed to her current anxiety. Objective Self-correct maladaptive thought patterns preemptively in order to lessen or  eliminate anxiety at least 90% of the time;  increase positive self-talk. Target Date: 2023-09-03 Frequency: Biweekly  Progress: 0 Modality: individual  Related Interventions Collaborate with the client to determine what kind of changes need to be made, internally and externally, for calmness and satisfaction to exist within the arenas of work, home, and school. Formulate with the client at least three possible solutions to resisting expectations that are limiting and anxiety producing. Provide the client with examples from the past and present of highly achieved women; encourage the client to articulate positive role models in her own life (e.g., female family members she admires, mentors). 2. Change the definition of self to encompass positive attributes beyond body weight, size, and shape. 3. Develop a realistic perspective regarding demands and obligations of multiple roles and their completion. Objective Learn and implement problem-solving and conflict-resolution skills. Target Date: 2023-09-03 Frequency: Biweekly  Progress: 0 Modality: individual  Related Interventions Educate the client about, and encourage her to attend to, healthy eating, sleeping, and exercise. Facilitate the client's development of a social support network to assist with multiple roles and responsibilities; develop a list of active support behaviors (e.g., babysitting, help with cleaning). Refer and encourage the client to join relevant support groups (e.g., single mothers, overextended women, time management). Assign the client to conduct family meetings regularly (e.g., possibly biweekly or monthly) with children (ages 22 and up) and partner to delegate household responsibilities. Objective Learn and implement stress management and relaxation techniques to reduce fatigue, anxiety, and depressive symptoms. Target Date: 2023-09-03 Frequency: Biweekly  Progress: 0 Modality: individual  Related Interventions Explore,  along with the client, strategies for making her life more manageable and less stressful (e.g., waking up before the children to exercise or have a cup of tea, pack the children's lunches and backpacks the night before). Objective Describe role and responsibilities associated with work and family and related thoughts, feelings, and behaviors. Target Date: 2023-09-03 Frequency: Biweekly  Progress: 0 Modality: individual  Related Interventions Assist the client in identifying the causes and consequences of at least two internal and two external expectations associated with balancing work and family roles. Explore with the client her multiple roles and responsibilities associated with work and family; clarify related thoughts and feelings. Explore with the client her expectations regarding the balance between work and family, and discuss how this relates to internalized gender role stereotypes. Objective Implement assertiveness skills and limit setting. Target Date: 2023-09-03 Frequency: Biweekly  Progress: 0 Modality: individual  Related Interventions Use behavioral techniques (i.e., education, modeling, role-playing, corrective feedback, positive reinforcement) to teach communication skills, including assertive communication, offering positive feedback, active listening, making positive requests of others for behavior change, and giving negative feedback in an honest and respectful manner. Objective Identify at least five practical ways to manage stress associated with multiple demands. Target Date: 2023-09-03 Frequency: Biweekly  Progress: 0 Modality: individual  Objective Commit to a healthy eating, sleeping, and exercise routine. Target Date: 2023-09-03 Frequency: Biweekly  Progress: 0 Modality: individual  Objective Clarify values and priorities. Target Date: 2023-09-03 Frequency: Biweekly  Progress: 0 Modality: individual  Related Interventions Assign the client to read about  progressive muscle relaxation and other calming strategies in relevant books or treatment manuals (e.g., Progressive Relaxation Training by Twana First; Mastery of Your Anxiety and Optometrist by Lucita Ferrara). Objective Identify and replace internalized expectations and standards regarding multiple roles that are potentially harmful. Target Date: 2023-09-03 Frequency: Biweekly  Progress: 0 Modality: individual  Related Interventions Help the client to explore and clarify  her own values and priorities; encourage her to reduce the psychological importance placed on one or more roles to reduce the level of conflict. Use modeling and/or role-playing to train the client in assertiveness; if indicated, refer her to an assertiveness training class/group for further instruction (see Low Self-Esteem/Lack of Assertiveness chapter in this Planner). Teach the client relaxation skills (e.g., progressive muscle relaxation, guided imagery, slow diaphragmatic breathing) and how to discriminate better between relaxation and tension; teach the client how to apply these skills to her daily life (e.g., Progressive Relaxation Training by Gwynneth Aliment and Dani Gobble; Treating GAD: Evidence-Based Strategies, Tools, and Techniques by Rygh and Amparo Bristol). Objective Communicate needs with partner regarding multiple role obligations. Target Date: 2023-09-03 Frequency: Biweekly  Progress: 0 Modality: individual  Related Interventions Help the client to develop a realistic schedule that outlines the responsibilities of her partner and family members; help the client enlist the commitment of all individuals to the schedule. Objective Generate a list of self-care activities and make a commitment to regularly participate in such activities. Target Date: 2023-09-03 Frequency: Biweekly  Progress: 0 Modality: individual  4. Develop a support system to assist with multiple roles and  responsibilities. 5. Develop an awareness of internalized unrealistic and narrow standards of women's beauty and learn to challenge such beliefs. 6. Develop effective coping strategies to deal with interpersonal stressors and emotional issues. 7. Develop healthy cognitive mechanisms to facilitate positive attitudes and beliefs about self within the context of one's environment to mitigate depressive symptoms. 8. Develop time management strategies to reduce role overload and role conflicts. 9. Eliminate depressive and anxiety symptoms associated with trying to balance multiple roles. 10. Enhance ability to handle effectively life stressors. 11. Identify and increase intrapersonal, interpersonal, and physical resources to foster positive coping strategies. 12. Improve depressed mood to maximize effective social, occupational, and physical functioning. Objective Articulate signs and symptoms of depression in current life experiences. Target Date: 2023-09-03 Frequency: Biweekly  Progress: 0 Modality: individual  Related Interventions Evaluate historical influences on current depressive symptoms, including previous episodes of depression, treatment history, and family history. Encourage the client to describe current and childhood experiences associated with key relationships (e.g., family-of-origin, peer and school relationships, dating relationships, female role models, extended family relationships), roles (e.g., partner, caretaker, worker, Ship broker), and cultural experiences (e.g., gender roles) that may contribute to depression. Encourage the client to share her feelings of depression to gain an insight into precipitating events and implications of symptoms; normalize her feelings of depression. Objective Identify and replace cognitive self-talk that supports depression. Target Date: 2023-09-03 Frequency: Biweekly  Progress: 0 Modality: individual  Related Interventions Reinforce the client's  positive, reality-based cognitive messages that enhance self-confidence and increase adaptive action (see "Positive Self-Talk" in the Adult Psychotherapy Homework Planner, 2nd ed. by Bryn Gulling). Do "behavioral experiments" in which depressive automatic thoughts are treated as hypotheses/predictions, reality-based alternative hypotheses/ predictions are generated, and both are tested against the client's past, present, and/or future experiences. Assign the client to keep a daily journal of automatic thoughts associated with depressive feelings (e.g., "Negative Thoughts Trigger Negative Feelings" in the Adult Psychotherapy Homework Planner, 2nd ed. by Bryn Gulling; "Daily Record of Dysfunctional Thoughts" in Cognitive Therapy of Depression by Lupita Shutter and Warren Lacy); process the journal material to challenge depressive thinking patterns and replace them with reality-based thoughts. Encourage the client to discuss cognitive distortions, including automatic thoughts (e.g., negative view of self, future, experience) and negative schemas (e.g., core beliefs about self and others based on earlier childhood experiences);  assess frequency of negative self-statements associated with depression. Objective Increase the frequency of engaging in pleasant activities. Target Date: 2023-09-03 Frequency: Biweekly  Progress: 0 Modality: individual  Related Interventions Assign the client reading materials regarding overcoming depression for women (e.g., Women and Depression: A Practical Self-Help Guide by Allyne Gee; Feeling Good by Lawerance Bach; Women & Depression by Collier Bullock; Silencing the Self: Women and Depression by Ree Kida). Monitor and encourage the client to attend to personal grooming and healthy sleeping and eating patterns; reinforce improvement. Objective Implement conflict resolution skills to alleviate interpersonal sources of depressed mood. Target Date: 2023-09-03 Frequency: Biweekly  Progress: 0 Modality: individual   Objective Articulate self-care methods to prevent and/or manage depression in the future; create a list of 10 coping methods for future use. Target Date: 2023-09-03 Frequency: Biweekly  Progress: 0 Modality: individual  Objective Develop healthy sleeping, eating, and grooming habits. Target Date: 2023-09-03 Frequency: Biweekly  Progress: 0 Modality: individual  Related Interventions Teach the client stress management techniques (e.g., progressive muscle relaxation, deep breathing, guided imagery, spirituality). Provide assertiveness training, role-playing with the client different ways of coping with and addressing situational stress; refer her to structured assertiveness training classes if necessary. Assist the client to reevaluate priorities shaping her daily life, encouraging her to set limits on the scope of everyday activities (e.g., caretaking, employment, division of household labor, schoolwork) to minimize stress in a way that facilitates personal empowerment. Objective Increase the level of physical exercise. Target Date: 2023-09-03 Frequency: Biweekly  Progress: 0 Modality: individual  13. Improve self-esteem and develop a positive self-image as capable and competent. 14. Increase ability to express needs and desires openly and honestly. 15. Increase assertiveness skills and ability to advocate for self. 16. Increase involvement in activities that foster confidence and a sense of accomplishment. 17. Increase openness to experiences and opportunities associated with risk-taking. 18. Increase overall sense of well-being via reduction/elimination of anxiety. 19. Maintain a balance between the multiple demands of motherhood, work, and other life roles and responsibilities. 20. Recognize the role of precipitating factors such as social oppression, gender roles, prior trauma, and learned behavior in the perpetuation of anxiety. 21. Reduce self-disparaging remarks and negative  self-talk. Objective Acknowledge self-disparaging statements and recognize the tendency to engage in such statements. Target Date: 2023-09-03 Frequency: Biweekly  Progress: 0 Modality: individual  Related Interventions Ask the client to complete and process an exercise in the book Ten Days to Self-Esteem! Lawerance Bach). Objective Decrease the frequency of making self-disparaging remarks. Target Date: 2023-09-03 Frequency: Biweekly  Progress: 0 Modality: individual  Related Interventions Ask the client to make a list including positive qualities about herself and accomplishments; verbally reinforce positive self-statements. Encourage the client to make at least one positive statement about herself during the therapy session. Assist the client in developing a list of positive affirmations to be read three times a day or to be put on a mirror at home. Objective Verbalize an understanding of the differences between passive, assertive, and aggressive behavior. Target Date: 2023-09-03 Frequency: Biweekly  Progress: 0 Modality: individual  Related Interventions Encourage participation in physical activities that foster confidence and well-being (e.g., yoga, meditation, martial arts). Assist the client in identifying intrapersonal and interpersonal obstacles to self-esteem (e.g., self-deprecating comments, perfectionism, unhealthy relationships). Objective Identify three anxiety-related fears associated with being assertive. Target Date: 2023-09-03 Frequency: Biweekly  Progress: 0 Modality: individual  Related Interventions Explore with the client the messages she received growing up regarding appropriate behavior for girls and women and the degree to which  she internalized these messages. Encourage the client to keep a journal in which she writes down her positive experiences and tracks her progress in building her self-esteem. Assist the client with identifying gender role messages that are  empowering and those that are limiting to her self-worth and assertiveness; facilitate her identifying and developing her own values. Objective Identify three roadblocks to improved self-esteem. Target Date: 2023-09-03 Frequency: Biweekly  Progress: 0 Modality: individual  Related Interventions Encourage the client to practice both in and out of the session the assertive behaviors that she was exposed to via the model. Utilize role-play and in-session rehearsal to teach and assess the client's assertiveness skills; reinforce progress. Objective Practice saying "no" to at least one person, declining to comply with a request/favor or identify and assert rights, needs, and wants. Target Date: 2023-09-03 Frequency: Biweekly  Progress: 0 Modality: individual  Objective Identify at least three positive personal attributes. Target Date: 2023-09-03 Frequency: Biweekly  Progress: 0 Modality: individual  Related Interventions Explore areas of competency with the client and encourage her to engage in related activities (e.g., dance, singing, arts, volunteer work, joining a book club). Explore the client's fears of being assertive (e.g., fear of rejection, fear of ineffectiveness, fear of ridicule). Assign the client to read Your Perfect Right: Assertiveness and Equality in Your Life and Relationships by Carnella GuadalajaraAlberti and Emmons; process the content to clarify the distinction between passivity, assertiveness, and aggression. 22. Resolve issues involving low self-esteem or low self-efficacy that contribute to anxiety. 23. Resolve the core conflict that is the source of anxiety. 24. Terminate the pattern of binge eating and consume more healthy choices leading to weight loss, decreased physical limitations, and improved self-esteem. Objective Admit to a persistent preoccupation and dissatisfaction with body image/size. Target Date: 2023-09-03 Frequency: Biweekly  Progress: 0 Modality: individual  Related  Interventions Assign the client to keep a journal of mirror checking (or avoiding looking in the mirror), body measuring, and/or efforts to camouflage her body. Objective Describe eating patterns, including frequency, amounts, and types of food consumed or hoarded. Target Date: 2023-09-03 Frequency: Biweekly  Progress: 0 Modality: individual  Related Interventions Document the client's eating pattern, including the antecedents and consequences of the eating behavior. Objective Eat at regular intervals, consuming foods and beverage that will lead to progressively weight loss. Target Date: 2023-09-03 Frequency: Biweekly  Progress: 0 Modality: individual  Related Interventions Assist the client in identifying cues that lead to unhealthy eating by assigning her to write a daily journal of eating behavior, thoughts, and feelings. Assist the client in developing more adaptive strategies and behaviors (e.g., relaxation, read a book, call a friend) to cope with uncomfortable emotional states. Objective Identify and change distorted self-talk messages associated with eating behavior. Target Date: 2023-09-03 Frequency: Biweekly  Progress: 0 Modality: individual  Related Interventions Assist the client in preparing to cope with future depressive or anxious symptoms and monitoring feelings in order to prevent relapses (e.g., prevent relapse by learning to recognize, respect, and respond to internal bodily cues such as feeling tired, anxious) and recognizing situations that trigger urges to binge, starve, or otherwise abuse food and plan other activities (e.g., engage in a favorite hobby such as listening to music, reading). Challenge the client's basis for construing herself in terms of shape so the concept of self goes beyond body weight; help her define herself in terms of relationships with family, friends, and spirituality, and in terms of accomplishments, traits, and abilities. Assist the client in  developing internal standards  of approval and reduce the need for external approval by brainstorming her positive attributes and qualities (e.g., personality, values, behavior). Objective Implement three new healthy and appropriate strategies to cope with stress, depression, and anxiety rather than using unhealthy eating patterns. Target Date: 2023-09-03 Frequency: Biweekly  Progress: 0 Modality: individual  Related Interventions Encourage the client to seek out situations, jobs, and people that affirm her self-worth and avoid situations or people that are harmful or demeaning; assist her in identifying affirming versus demeaning people and circumstances. Objective Verbalize a definition of self that includes qualities beyond body weight, size, and shape. Target Date: 2023-09-03 Frequency: Biweekly  Progress: 0 Modality: individual    Diagnosis:Major depression, recurrent, chronic (HCC)  Anxiety, with emotional eating  Plan:  -meet bi-weekly -pt is to familiarize herself with her treatment plan prior to next session -pt is to begin noticing the difference between physical hunger signals and emotional hunger -next session will be on Monday, September 17, 2022 at 1pm

## 2022-09-03 NOTE — Progress Notes (Signed)
° ° ° ° ° ° ° ° ° ° ° ° ° ° °  Starlee Corralejo, LCMHC °

## 2022-09-10 ENCOUNTER — Ambulatory Visit: Payer: BC Managed Care – PPO | Admitting: Professional

## 2022-09-17 ENCOUNTER — Encounter: Payer: Self-pay | Admitting: Professional

## 2022-09-17 ENCOUNTER — Ambulatory Visit (INDEPENDENT_AMBULATORY_CARE_PROVIDER_SITE_OTHER): Payer: BC Managed Care – PPO | Admitting: Professional

## 2022-09-17 DIAGNOSIS — F419 Anxiety disorder, unspecified: Secondary | ICD-10-CM

## 2022-09-17 DIAGNOSIS — F339 Major depressive disorder, recurrent, unspecified: Secondary | ICD-10-CM | POA: Diagnosis not present

## 2022-09-17 NOTE — Progress Notes (Signed)
[facility] Counselor/Therapist Progress Note  Patient ID: [name], MRN: [id number],    Date: 09/17/2022  Time Spent: 50 minutes 102-152pm  Treatment Type: Individual Therapy  Risk Assessment: Danger to Self:  No Self-injurious Behavior: No Danger to Others: No  Subjective: This session was held via video teletherapy. The patient consented to video teletherapy and was located at her home during this session. She is aware it is the responsibility of the patient to secure confidentiality on her end of the session. The provider was in a private home office for the duration of this session.    The patient arrived on time for her video appointment.   Issues addressed: 1-homework-completed -pt is to familiarize herself with her treatment plan prior to next session -pt is to begin noticing the difference between physical hunger signals and emotional hunger 2-nutrition -reduced inflammation in joints -has been more sensitive  -emotional eating has reduced -has been using gum and the chewing has helped -has cut out excess snacking -is drinking less soda -has lost ten pounds  3-relationship with parents -pt has internal battle with herself -her mother is very negative though she thinks she is an optimist 4-mood a-reports not feeling depressed around holidays -family all lives within 20 minutes except for one son who is within an hour b-some stressors impact her mood especially in dealing with my mom -it's like walking on egg shells c-how to combat negative thinking -helpful and healthy vs hurtful and unhealthy  Treatment Plan Problems Addressed  Balancing Work and Family/Multiple Roles, Body Image Disturbance/Eating Disorders, Depression, Low Self-Esteem/Lack of Assertiveness Goals 1. Address and eliminate maladaptive thought processes that lead to anxious responses. Objective Verbalize anxiety symptoms as well as when, where, and how frequently they  occur; describe attempts to resolve anxiety. Target Date: 2023-09-03 Frequency: Biweekly  Progress: 0 Modality: individual  Related Interventions Explore with the client her current understanding of the external and internal factors that contribute to her anxiety (e.g., gender roles, social relationships/lack of support, socioeconomic status, trauma, brain chemistry, and hormones). Objective Identify precipitating events, thoughts, feelings, and reactions that are believed to contribute to anxiety. Target Date: 2023-09-03 Frequency: Biweekly  Progress: 0 Modality: individual  Related Interventions Educate the client on the relaxing, calming, and balancing benefits of practices such as yoga, meditation, and prayer; provide suggestions on how to get started (e.g., book, video, local gym class). Aid the client in devising a healthy diet and eating schedule, a pattern of adequate sleep and relaxation, as well as regular cardiovascular exercise. Objective Implement self-care strategies that serve to augment the positive effects of other interventions. Target Date: 2023-09-03 Frequency: Biweekly  Progress: 0 Modality: individual  Related Interventions Elicit the client's specific patterns of thought that contribute to anxious states; aid client in understanding the connection between the identified maladaptive thoughts and anxious feelings. Objective Acknowledge underlying irrational or illogical thought patterns that contribute to anxiety. Target Date: 2023-09-03 Frequency: Biweekly  Progress: 0 Modality: individual  Related Interventions Aid the client in seeing the connection between a focus on helping others or improving society and a reduction in anxiety (i.e., dwelling on maladaptive thoughts). Explore with the client how gender roles have been received and internalized, and in turn how they have contributed to her current anxiety. Objective Self-correct maladaptive thought patterns  preemptively in order to lessen or eliminate anxiety at least 90% of the time; increase positive self-talk. Target Date: 2023-09-03 Frequency: Biweekly  Progress: 0 Modality: individual  Related Interventions Collaborate  with the client to determine what kind of changes need to be made, internally and externally, for calmness and satisfaction to exist within the arenas of work, home, and school. Formulate with the client at least three possible solutions to resisting expectations that are limiting and anxiety producing. Provide the client with examples from the past and present of highly achieved women; encourage the client to articulate positive role models in her own life (e.g., female family members she admires, mentors). 2. Change the definition of self to encompass positive attributes beyond body weight, size, and shape. 3. Develop a realistic perspective regarding demands and obligations of multiple roles and their completion. Objective Learn and implement problem-solving and conflict-resolution skills. Target Date: 2023-09-03 Frequency: Biweekly  Progress: 0 Modality: individual  Related Interventions Educate the client about, and encourage her to attend to, healthy eating, sleeping, and exercise. Facilitate the client's development of a social support network to assist with multiple roles and responsibilities; develop a list of active support behaviors (e.g., babysitting, help with cleaning). Refer and encourage the client to join relevant support groups (e.g., single mothers, overextended women, time management). Assign the client to conduct family meetings regularly (e.g., possibly biweekly or monthly) with children (ages 806 and up) and partner to delegate household responsibilities. Objective Learn and implement stress management and relaxation techniques to reduce fatigue, anxiety, and depressive symptoms. Target Date: 2023-09-03 Frequency: Biweekly  Progress: 0 Modality: individual   Related Interventions Explore, along with the client, strategies for making her life more manageable and less stressful (e.g., waking up before the children to exercise or have a cup of tea, pack the children's lunches and backpacks the night before). Objective Describe role and responsibilities associated with work and family and related thoughts, feelings, and behaviors. Target Date: 2023-09-03 Frequency: Biweekly  Progress: 0 Modality: individual  Related Interventions Assist the client in identifying the causes and consequences of at least two internal and two external expectations associated with balancing work and family roles. Explore with the client her multiple roles and responsibilities associated with work and family; clarify related thoughts and feelings. Explore with the client her expectations regarding the balance between work and family, and discuss how this relates to internalized gender role stereotypes. Objective Implement assertiveness skills and limit setting. Target Date: 2023-09-03 Frequency: Biweekly  Progress: 0 Modality: individual  Related Interventions Use behavioral techniques (i.e., education, modeling, role-playing, corrective feedback, positive reinforcement) to teach communication skills, including assertive communication, offering positive feedback, active listening, making positive requests of others for behavior change, and giving negative feedback in an honest and respectful manner. Objective Identify at least five practical ways to manage stress associated with multiple demands. Target Date: 2023-09-03 Frequency: Biweekly  Progress: 0 Modality: individual  Objective Commit to a healthy eating, sleeping, and exercise routine. Target Date: 2023-09-03 Frequency: Biweekly  Progress: 0 Modality: individual  Objective Clarify values and priorities. Target Date: 2023-09-03 Frequency: Biweekly  Progress: 0 Modality: individual  Related Interventions Assign  the client to read about progressive muscle relaxation and other calming strategies in relevant books or treatment manuals (e.g., Progressive Relaxation Training by Twana FirstBernstein and Borkovec; Mastery of Your Anxiety and OptometristWorry-Client Guide by Lucita FerraraZinbarg, Craske, Barlow, and O'Leary). Objective Identify and replace internalized expectations and standards regarding multiple roles that are potentially harmful. Target Date: 2023-09-03 Frequency: Biweekly  Progress: 0 Modality: individual  Related Interventions Help the client to explore and clarify her own values and priorities; encourage her to reduce the psychological importance placed on one or more  roles to reduce the level of conflict. Use modeling and/or role-playing to train the client in assertiveness; if indicated, refer her to an assertiveness training class/group for further instruction (see Low Self-Esteem/Lack of Assertiveness chapter in this Planner). Teach the client relaxation skills (e.g., progressive muscle relaxation, guided imagery, slow diaphragmatic breathing) and how to discriminate better between relaxation and tension; teach the client how to apply these skills to her daily life (e.g., Progressive Relaxation Training by Robb Matar and Alen Blew; Treating GAD: Evidence-Based Strategies, Tools, and Techniques by Rygh and Ida Rogue). Objective Communicate needs with partner regarding multiple role obligations. Target Date: 2023-09-03 Frequency: Biweekly  Progress: 0 Modality: individual  Related Interventions Help the client to develop a realistic schedule that outlines the responsibilities of her partner and family members; help the client enlist the commitment of all individuals to the schedule. Objective Generate a list of self-care activities and make a commitment to regularly participate in such activities. Target Date: 2023-09-03 Frequency: Biweekly  Progress: 0 Modality: individual  4. Develop a support system to assist with  multiple roles and responsibilities. 5. Develop an awareness of internalized unrealistic and narrow standards of women's beauty and learn to challenge such beliefs. 6. Develop effective coping strategies to deal with interpersonal stressors and emotional issues. 7. Develop healthy cognitive mechanisms to facilitate positive attitudes and beliefs about self within the context of one's environment to mitigate depressive symptoms. 8. Develop time management strategies to reduce role overload and role conflicts. 9. Eliminate depressive and anxiety symptoms associated with trying to balance multiple roles. 10. Enhance ability to handle effectively life stressors. 11. Identify and increase intrapersonal, interpersonal, and physical resources to foster positive coping strategies. 12. Improve depressed mood to maximize effective social, occupational, and physical functioning. Objective Articulate signs and symptoms of depression in current life experiences. Target Date: 2023-09-03 Frequency: Biweekly  Progress: 0 Modality: individual  Related Interventions Evaluate historical influences on current depressive symptoms, including previous episodes of depression, treatment history, and family history. Encourage the client to describe current and childhood experiences associated with key relationships (e.g., family-of-origin, peer and school relationships, dating relationships, female role models, extended family relationships), roles (e.g., partner, caretaker, worker, Consulting civil engineer), and cultural experiences (e.g., gender roles) that may contribute to depression. Encourage the client to share her feelings of depression to gain an insight into precipitating events and implications of symptoms; normalize her feelings of depression. Objective Identify and replace cognitive self-talk that supports depression. Target Date: 2023-09-03 Frequency: Biweekly  Progress: 0 Modality: individual  Related  Interventions Reinforce the client's positive, reality-based cognitive messages that enhance self-confidence and increase adaptive action (see "Positive Self-Talk" in the Adult Psychotherapy Homework Planner, 2nd ed. by Stephannie Li). Do "behavioral experiments" in which depressive automatic thoughts are treated as hypotheses/predictions, reality-based alternative hypotheses/ predictions are generated, and both are tested against the client's past, present, and/or future experiences. Assign the client to keep a daily journal of automatic thoughts associated with depressive feelings (e.g., "Negative Thoughts Trigger Negative Feelings" in the Adult Psychotherapy Homework Planner, 2nd ed. by Stephannie Li; "Daily Record of Dysfunctional Thoughts" in Cognitive Therapy of Depression by Ashby Dawes and Shea Evans); process the journal material to challenge depressive thinking patterns and replace them with reality-based thoughts. Encourage the client to discuss cognitive distortions, including automatic thoughts (e.g., negative view of self, future, experience) and negative schemas (e.g., core beliefs about self and others based on earlier childhood experiences); assess frequency of negative self-statements associated with depression. Objective Increase the frequency of engaging in pleasant activities.  Target Date: 2023-09-03 Frequency: Biweekly  Progress: 0 Modality: individual  Related Interventions Assign the client reading materials regarding overcoming depression for women (e.g., Women and Depression: A Practical Self-Help Guide by Allyne Gee; Feeling Good by Lawerance Bach; Women & Depression by Collier Bullock; Silencing the Self: Women and Depression by Ree Kida). Monitor and encourage the client to attend to personal grooming and healthy sleeping and eating patterns; reinforce improvement. Objective Implement conflict resolution skills to alleviate interpersonal sources of depressed mood. Target Date: 2023-09-03 Frequency:  Biweekly  Progress: 0 Modality: individual  Objective Articulate self-care methods to prevent and/or manage depression in the future; create a list of 10 coping methods for future use. Target Date: 2023-09-03 Frequency: Biweekly  Progress: 0 Modality: individual  Objective Develop healthy sleeping, eating, and grooming habits. Target Date: 2023-09-03 Frequency: Biweekly  Progress: 0 Modality: individual  Related Interventions Teach the client stress management techniques (e.g., progressive muscle relaxation, deep breathing, guided imagery, spirituality). Provide assertiveness training, role-playing with the client different ways of coping with and addressing situational stress; refer her to structured assertiveness training classes if necessary. Assist the client to reevaluate priorities shaping her daily life, encouraging her to set limits on the scope of everyday activities (e.g., caretaking, employment, division of household labor, schoolwork) to minimize stress in a way that facilitates personal empowerment. Objective Increase the level of physical exercise. Target Date: 2023-09-03 Frequency: Biweekly  Progress: 0 Modality: individual  13. Improve self-esteem and develop a positive self-image as capable and competent. 14. Increase ability to express needs and desires openly and honestly. 15. Increase assertiveness skills and ability to advocate for self. 16. Increase involvement in activities that foster confidence and a sense of accomplishment. 17. Increase openness to experiences and opportunities associated with risk-taking. 18. Increase overall sense of well-being via reduction/elimination of anxiety. 19. Maintain a balance between the multiple demands of motherhood, work, and other life roles and responsibilities. 20. Recognize the role of precipitating factors such as social oppression, gender roles, prior trauma, and learned behavior in the perpetuation of anxiety. 21. Reduce  self-disparaging remarks and negative self-talk. Objective Acknowledge self-disparaging statements and recognize the tendency to engage in such statements. Target Date: 2023-09-03 Frequency: Biweekly  Progress: 0 Modality: individual  Related Interventions Ask the client to complete and process an exercise in the book Ten Days to Self-Esteem! Lawerance Bach). Objective Decrease the frequency of making self-disparaging remarks. Target Date: 2023-09-03 Frequency: Biweekly  Progress: 0 Modality: individual  Related Interventions Ask the client to make a list including positive qualities about herself and accomplishments; verbally reinforce positive self-statements. Encourage the client to make at least one positive statement about herself during the therapy session. Assist the client in developing a list of positive affirmations to be read three times a day or to be put on a mirror at home. Objective Verbalize an understanding of the differences between passive, assertive, and aggressive behavior. Target Date: 2023-09-03 Frequency: Biweekly  Progress: 0 Modality: individual  Related Interventions Encourage participation in physical activities that foster confidence and well-being (e.g., yoga, meditation, martial arts). Assist the client in identifying intrapersonal and interpersonal obstacles to self-esteem (e.g., self-deprecating comments, perfectionism, unhealthy relationships). Objective Identify three anxiety-related fears associated with being assertive. Target Date: 2023-09-03 Frequency: Biweekly  Progress: 0 Modality: individual  Related Interventions Explore with the client the messages she received growing up regarding appropriate behavior for girls and women and the degree to which she internalized these messages. Encourage the client to keep a journal in which she writes down her  positive experiences and tracks her progress in building her self-esteem. Assist the client with identifying  gender role messages that are empowering and those that are limiting to her self-worth and assertiveness; facilitate her identifying and developing her own values. Objective Identify three roadblocks to improved self-esteem. Target Date: 2023-09-03 Frequency: Biweekly  Progress: 0 Modality: individual  Related Interventions Encourage the client to practice both in and out of the session the assertive behaviors that she was exposed to via the model. Utilize role-play and in-session rehearsal to teach and assess the client's assertiveness skills; reinforce progress. Objective Practice saying "no" to at least one person, declining to comply with a request/favor or identify and assert rights, needs, and wants. Target Date: 2023-09-03 Frequency: Biweekly  Progress: 0 Modality: individual  Objective Identify at least three positive personal attributes. Target Date: 2023-09-03 Frequency: Biweekly  Progress: 0 Modality: individual  Related Interventions Explore areas of competency with the client and encourage her to engage in related activities (e.g., dance, singing, arts, volunteer work, joining a book club). Explore the client's fears of being assertive (e.g., fear of rejection, fear of ineffectiveness, fear of ridicule). Assign the client to read Your Perfect Right: Assertiveness and Equality in Your Life and Relationships by Carnella Guadalajara; process the content to clarify the distinction between passivity, assertiveness, and aggression. 22. Resolve issues involving low self-esteem or low self-efficacy that contribute to anxiety. 23. Resolve the core conflict that is the source of anxiety. 24. Terminate the pattern of binge eating and consume more healthy choices leading to weight loss, decreased physical limitations, and improved self-esteem. Objective Admit to a persistent preoccupation and dissatisfaction with body image/size. Target Date: 2023-09-03 Frequency: Biweekly  Progress: 0  Modality: individual  Related Interventions Assign the client to keep a journal of mirror checking (or avoiding looking in the mirror), body measuring, and/or efforts to camouflage her body. Objective Describe eating patterns, including frequency, amounts, and types of food consumed or hoarded. Target Date: 2023-09-03 Frequency: Biweekly  Progress: 0 Modality: individual  Related Interventions Document the client's eating pattern, including the antecedents and consequences of the eating behavior. Objective Eat at regular intervals, consuming foods and beverage that will lead to progressively weight loss. Target Date: 2023-09-03 Frequency: Biweekly  Progress: 0 Modality: individual  Related Interventions Assist the client in identifying cues that lead to unhealthy eating by assigning her to write a daily journal of eating behavior, thoughts, and feelings. Assist the client in developing more adaptive strategies and behaviors (e.g., relaxation, read a book, call a friend) to cope with uncomfortable emotional states. Objective Identify and change distorted self-talk messages associated with eating behavior. Target Date: 2023-09-03 Frequency: Biweekly  Progress: 0 Modality: individual  Related Interventions Assist the client in preparing to cope with future depressive or anxious symptoms and monitoring feelings in order to prevent relapses (e.g., prevent relapse by learning to recognize, respect, and respond to internal bodily cues such as feeling tired, anxious) and recognizing situations that trigger urges to binge, starve, or otherwise abuse food and plan other activities (e.g., engage in a favorite hobby such as listening to music, reading). Challenge the client's basis for construing herself in terms of shape so the concept of self goes beyond body weight; help her define herself in terms of relationships with family, friends, and spirituality, and in terms of accomplishments, traits, and  abilities. Assist the client in developing internal standards of approval and reduce the need for external approval by brainstorming her positive attributes and qualities (e.g.,  personality, values, behavior). Objective Implement three new healthy and appropriate strategies to cope with stress, depression, and anxiety rather than using unhealthy eating patterns. Target Date: 2023-09-03 Frequency: Biweekly  Progress: 0 Modality: individual  Related Interventions Encourage the client to seek out situations, jobs, and people that affirm her self-worth and avoid situations or people that are harmful or demeaning; assist her in identifying affirming versus demeaning people and circumstances. Objective Verbalize a definition of self that includes qualities beyond body weight, size, and shape. Target Date: 2023-09-03 Frequency: Biweekly  Progress: 0 Modality: individual    Diagnosis:Major depression, recurrent, chronic (HCC)  Anxiety, with emotional eating  Plan:  -continue working on improving nutrition choices  -begin noticing when you are having negative thoughts and try to replace with healthy and helpful ones -next session will be on Monday, December 62, 2023 at 1pm

## 2022-09-25 ENCOUNTER — Ambulatory Visit: Payer: BC Managed Care – PPO | Admitting: Professional

## 2022-10-01 ENCOUNTER — Ambulatory Visit: Payer: BC Managed Care – PPO | Admitting: Professional

## 2022-10-03 ENCOUNTER — Ambulatory Visit (INDEPENDENT_AMBULATORY_CARE_PROVIDER_SITE_OTHER): Payer: BC Managed Care – PPO | Admitting: Professional

## 2022-10-03 ENCOUNTER — Encounter: Payer: Self-pay | Admitting: Professional

## 2022-10-03 DIAGNOSIS — F339 Major depressive disorder, recurrent, unspecified: Secondary | ICD-10-CM

## 2022-10-03 DIAGNOSIS — F419 Anxiety disorder, unspecified: Secondary | ICD-10-CM | POA: Diagnosis not present

## 2022-10-03 NOTE — Progress Notes (Signed)
[facility] Counselor/Therapist Progress Note  Patient ID: [name], MRN: [id number],    Date: 10/03/2022  Time Spent: 60 minutes 1201-101pm  Treatment Type: Individual Therapy  Risk Assessment: Danger to Self:  No Self-injurious Behavior: No Danger to Others: No  Subjective: This session was held via video teletherapy. The patient consented to video teletherapy and was located in her car during this session. She is aware it is the responsibility of the patient to secure confidentiality on her end of the session. The provider was in a private home office for the duration of this session.    The patient arrived on time for her video appointment.   Issues addressed: 1-homework-completed, pt did not spiral in her thinking and behaviors -continue working on improving nutrition choices -begin noticing when you are having negative thoughts and try to replace with healthy and helpful ones 2-physical a-pt has been doing PT and she is doing better -she is able to walk up steps -she is now able to stand in the kitchen and cook -she is walking distance -her balance has improved -feels exhausted 3-non-scale victories -walking up steps, walking in general, standing -has slimmed down on hips and noticed when standing her hips did not touch the arms -able to talk and walk -was parking in handicapped spots at work but now does not require 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 Address and eliminate maladaptive thought processes that lead to  anxious responses. Objective Verbalize anxiety symptoms as well as when, where, and how frequently they occur; describe attempts to resolve anxiety. Target Date: 2023-09-03 Frequency: Biweekly  Progress: 0 Modality: individual  Related Interventions Explore with the client her current understanding of the external and internal factors that contribute to her anxiety (e.g., gender roles, social relationships/lack of support, socioeconomic status, trauma, brain chemistry, and hormones). Objective Identify precipitating events, thoughts, feelings, and reactions that are believed to contribute to anxiety. Target Date: 2023-09-03 Frequency: Biweekly  Progress: 0 Modality: individual  Related Interventions Educate the client on the relaxing, calming, and balancing benefits of practices such as yoga, meditation, and prayer; provide suggestions on how to get started (e.g., book, video, local gym class). Aid the client in devising a healthy diet and eating schedule, a pattern of adequate sleep and relaxation, as well as regular cardiovascular exercise. Objective Implement self-care strategies that serve to augment the positive effects of other interventions. Target Date: 2023-09-03 Frequency: Biweekly  Progress: 0 Modality: individual  Related Interventions Elicit the client's specific patterns of thought that contribute to anxious states; aid client in understanding the connection between the identified maladaptive thoughts and anxious feelings. Objective Acknowledge underlying irrational or illogical thought patterns that contribute to anxiety. Target Date: 2023-09-03 Frequency: Biweekly  Progress: 0 Modality: individual  Related Interventions Aid the client in seeing the connection between a focus on helping others or improving society and a reduction in anxiety (i.e., dwelling on maladaptive thoughts). Explore with the client how gender roles have been received and internalized, and in turn how  they have contributed to her current anxiety. Objective Self-correct maladaptive thought patterns preemptively in order to lessen  or eliminate anxiety at least 90% of the time; increase positive self-talk. Target Date: 2023-09-03 Frequency: Biweekly  Progress: 0 Modality: individual  Related Interventions Collaborate with the client to determine what kind of changes need to be made, internally and externally, for calmness and satisfaction to exist within the arenas of work, home, and school. Formulate with the client at least three possible solutions to resisting expectations that are limiting and anxiety producing. Provide the client with examples from the past and present of highly achieved women; encourage the client to articulate positive role models in her own life (e.g., female family members she admires, mentors). 2. Change the definition of self to encompass positive attributes beyond body weight, size, and shape. 3. Develop a realistic perspective regarding demands and obligations of multiple roles and their completion. Objective Learn and implement problem-solving and conflict-resolution skills. Target Date: 2023-09-03 Frequency: Biweekly  Progress: 0 Modality: individual  Related Interventions Educate the client about, and encourage her to attend to, healthy eating, sleeping, and exercise. Facilitate the client's development of a social support network to assist with multiple roles and responsibilities; develop a list of active support behaviors (e.g., babysitting, help with cleaning). Refer and encourage the client to join relevant support groups (e.g., single mothers, overextended women, time management). Assign the client to conduct family meetings regularly (e.g., possibly biweekly or monthly) with children (ages 52 and up) and partner to delegate household responsibilities. Objective Learn and implement stress management and relaxation techniques to reduce fatigue, anxiety, and  depressive symptoms. Target Date: 2023-09-03 Frequency: Biweekly  Progress: 0 Modality: individual  Related Interventions Explore, along with the client, strategies for making her life more manageable and less stressful (e.g., waking up before the children to exercise or have a cup of tea, pack the children's lunches and backpacks the night before). Objective Describe role and responsibilities associated with work and family and related thoughts, feelings, and behaviors. Target Date: 2023-09-03 Frequency: Biweekly  Progress: 0 Modality: individual  Related Interventions Assist the client in identifying the causes and consequences of at least two internal and two external expectations associated with balancing work and family roles. Explore with the client her multiple roles and responsibilities associated with work and family; clarify related thoughts and feelings. Explore with the client her expectations regarding the balance between work and family, and discuss how this relates to internalized gender role stereotypes. Objective Implement assertiveness skills and limit setting. Target Date: 2023-09-03 Frequency: Biweekly  Progress: 0 Modality: individual  Related Interventions Use behavioral techniques (i.e., education, modeling, role-playing, corrective feedback, positive reinforcement) to teach communication skills, including assertive communication, offering positive feedback, active listening, making positive requests of others for behavior change, and giving negative feedback in an honest and respectful manner. Objective Identify at least five practical ways to manage stress associated with multiple demands. Target Date: 2023-09-03 Frequency: Biweekly  Progress: 0 Modality: individual  Objective Commit to a healthy eating, sleeping, and exercise routine. Target Date: 2023-09-03 Frequency: Biweekly  Progress: 0 Modality: individual  Objective Clarify values and priorities. Target  Date: 2023-09-03 Frequency: Biweekly  Progress: 0 Modality: individual  Related Interventions Assign the client to read about progressive muscle relaxation and other calming strategies in relevant books or treatment manuals (e.g., Progressive Relaxation Training by Twana First; Mastery of Your Anxiety and Optometrist by Lucita Ferrara). Objective Identify and replace internalized expectations and standards regarding multiple roles that are potentially harmful. Target Date: 2023-09-03 Frequency: Biweekly  Progress: 0 Modality: individual  Related Interventions Help the client to explore and clarify her own values and priorities; encourage her to reduce the psychological importance placed on one or more roles to reduce the level of conflict. Use modeling and/or role-playing to train the client in assertiveness; if indicated, refer her to an assertiveness training class/group for further instruction (see Low Self-Esteem/Lack of Assertiveness chapter in this Planner). Teach the client relaxation skills (e.g., progressive muscle relaxation, guided imagery, slow diaphragmatic breathing) and how to discriminate better between relaxation and tension; teach the client how to apply these skills to her daily life (e.g., Progressive Relaxation Training by Robb Matar and Alen Blew; Treating GAD: Evidence-Based Strategies, Tools, and Techniques by Rygh and Ida Rogue). Objective Communicate needs with partner regarding multiple role obligations. Target Date: 2023-09-03 Frequency: Biweekly  Progress: 0 Modality: individual  Related Interventions Help the client to develop a realistic schedule that outlines the responsibilities of her partner and family members; help the client enlist the commitment of all individuals to the schedule. Objective Generate a list of self-care activities and make a commitment to regularly participate in such activities. Target Date: 2023-09-03  Frequency: Biweekly  Progress: 0 Modality: individual  4. Develop a support system to assist with multiple roles and responsibilities. 5. Develop an awareness of internalized unrealistic and narrow standards of women's beauty and learn to challenge such beliefs. 6. Develop effective coping strategies to deal with interpersonal stressors and emotional issues. 7. Develop healthy cognitive mechanisms to facilitate positive attitudes and beliefs about self within the context of one's environment to mitigate depressive symptoms. 8. Develop time management strategies to reduce role overload and role conflicts. 9. Eliminate depressive and anxiety symptoms associated with trying to balance multiple roles. 10. Enhance ability to handle effectively life stressors. 11. Identify and increase intrapersonal, interpersonal, and physical resources to foster positive coping strategies. 12. Improve depressed mood to maximize effective social, occupational, and physical functioning. Objective Articulate signs and symptoms of depression in current life experiences. Target Date: 2023-09-03 Frequency: Biweekly  Progress: 0 Modality: individual  Related Interventions Evaluate historical influences on current depressive symptoms, including previous episodes of depression, treatment history, and family history. Encourage the client to describe current and childhood experiences associated with key relationships (e.g., family-of-origin, peer and school relationships, dating relationships, female role models, extended family relationships), roles (e.g., partner, caretaker, worker, Consulting civil engineer), and cultural experiences (e.g., gender roles) that may contribute to depression. Encourage the client to share her feelings of depression to gain an insight into precipitating events and implications of symptoms; normalize her feelings of depression. Objective Identify and replace cognitive self-talk that supports depression. Target  Date: 2023-09-03 Frequency: Biweekly  Progress: 0 Modality: individual  Related Interventions Reinforce the client's positive, reality-based cognitive messages that enhance self-confidence and increase adaptive action (see "Positive Self-Talk" in the Adult Psychotherapy Homework Planner, 2nd ed. by Stephannie Li). Do "behavioral experiments" in which depressive automatic thoughts are treated as hypotheses/predictions, reality-based alternative hypotheses/ predictions are generated, and both are tested against the client's past, present, and/or future experiences. Assign the client to keep a daily journal of automatic thoughts associated with depressive feelings (e.g., "Negative Thoughts Trigger Negative Feelings" in the Adult Psychotherapy Homework Planner, 2nd ed. by Stephannie Li; "Daily Record of Dysfunctional Thoughts" in Cognitive Therapy of Depression by Ashby Dawes and Shea Evans); process the journal material to challenge depressive thinking patterns and replace them with reality-based thoughts. Encourage the client to discuss cognitive distortions, including automatic thoughts (e.g., negative view of self, future, experience) and negative schemas (e.g., core beliefs  about self and others based on earlier childhood experiences); assess frequency of negative self-statements associated with depression. Objective Increase the frequency of engaging in pleasant activities. Target Date: 2023-09-03 Frequency: Biweekly  Progress: 0 Modality: individual  Related Interventions Assign the client reading materials regarding overcoming depression for women (e.g., Women and Depression: A Practical Self-Help Guide by Baird Cancer; Feeling Good by Quay Burow; Women & Depression by Aubery Lapping; Silencing the Self: Women and Depression by Barnabas Lister). Monitor and encourage the client to attend to personal grooming and healthy sleeping and eating patterns; reinforce improvement. Objective Implement conflict resolution skills to alleviate  interpersonal sources of depressed mood. Target Date: 2023-09-03 Frequency: Biweekly  Progress: 0 Modality: individual  Objective Articulate self-care methods to prevent and/or manage depression in the future; create a list of 10 coping methods for future use. Target Date: 2023-09-03 Frequency: Biweekly  Progress: 0 Modality: individual  Objective Develop healthy sleeping, eating, and grooming habits. Target Date: 2023-09-03 Frequency: Biweekly  Progress: 0 Modality: individual  Related Interventions Teach the client stress management techniques (e.g., progressive muscle relaxation, deep breathing, guided imagery, spirituality). Provide assertiveness training, role-playing with the client different ways of coping with and addressing situational stress; refer her to structured assertiveness training classes if necessary. Assist the client to reevaluate priorities shaping her daily life, encouraging her to set limits on the scope of everyday activities (e.g., caretaking, employment, division of household labor, schoolwork) to minimize stress in a way that facilitates personal empowerment. Objective Increase the level of physical exercise. Target Date: 2023-09-03 Frequency: Biweekly  Progress: 0 Modality: individual  13. Improve self-esteem and develop a positive self-image as capable and competent. 14. Increase ability to express needs and desires openly and honestly. 15. Increase assertiveness skills and ability to advocate for self. 16. Increase involvement in activities that foster confidence and a sense of accomplishment. 17. Increase openness to experiences and opportunities associated with risk-taking. 18. Increase overall sense of well-being via reduction/elimination of anxiety. 74. Maintain a balance between the multiple demands of motherhood, work, and other life roles and responsibilities. 20. Recognize the role of precipitating factors such as social oppression, gender roles, prior  trauma, and learned behavior in the perpetuation of anxiety. 21. Reduce self-disparaging remarks and negative self-talk. Objective Acknowledge self-disparaging statements and recognize the tendency to engage in such statements. Target Date: 2023-09-03 Frequency: Biweekly  Progress: 0 Modality: individual  Related Interventions Ask the client to complete and process an exercise in the book Ten Days to Self-Esteem! Quay Burow). Objective Decrease the frequency of making self-disparaging remarks. Target Date: 2023-09-03 Frequency: Biweekly  Progress: 0 Modality: individual  Related Interventions Ask the client to make a list including positive qualities about herself and accomplishments; verbally reinforce positive self-statements. Encourage the client to make at least one positive statement about herself during the therapy session. Assist the client in developing a list of positive affirmations to be read three times a day or to be put on a mirror at home. Objective Verbalize an understanding of the differences between passive, assertive, and aggressive behavior. Target Date: 2023-09-03 Frequency: Biweekly  Progress: 0 Modality: individual  Related Interventions Encourage participation in physical activities that foster confidence and well-being (e.g., yoga, meditation, martial arts). Assist the client in identifying intrapersonal and interpersonal obstacles to self-esteem (e.g., self-deprecating comments, perfectionism, unhealthy relationships). Objective Identify three anxiety-related fears associated with being assertive. Target Date: 2023-09-03 Frequency: Biweekly  Progress: 0 Modality: individual  Related Interventions Explore with the client the messages she received growing up regarding appropriate behavior  for girls and women and the degree to which she internalized these messages. Encourage the client to keep a journal in which she writes down her positive experiences and tracks her  progress in building her self-esteem. Assist the client with identifying gender role messages that are empowering and those that are limiting to her self-worth and assertiveness; facilitate her identifying and developing her own values. Objective Identify three roadblocks to improved self-esteem. Target Date: 2023-09-03 Frequency: Biweekly  Progress: 0 Modality: individual  Related Interventions Encourage the client to practice both in and out of the session the assertive behaviors that she was exposed to via the model. Utilize role-play and in-session rehearsal to teach and assess the client's assertiveness skills; reinforce progress. Objective Practice saying "no" to at least one person, declining to comply with a request/favor or identify and assert rights, needs, and wants. Target Date: 2023-09-03 Frequency: Biweekly  Progress: 0 Modality: individual  Objective Identify at least three positive personal attributes. Target Date: 2023-09-03 Frequency: Biweekly  Progress: 0 Modality: individual  Related Interventions Explore areas of competency with the client and encourage her to engage in related activities (e.g., dance, singing, arts, volunteer work, joining a book club). Explore the client's fears of being assertive (e.g., fear of rejection, fear of ineffectiveness, fear of ridicule). Assign the client to read Your Perfect Right: Assertiveness and Equality in Your Life and Relationships by Neldon Newport; process the content to clarify the distinction between passivity, assertiveness, and aggression. 22. Resolve issues involving low self-esteem or low self-efficacy that contribute to anxiety. 23. Resolve the core conflict that is the source of anxiety. 24. Terminate the pattern of binge eating and consume more healthy choices leading to weight loss, decreased physical limitations, and improved self-esteem. Objective Admit to a persistent preoccupation and dissatisfaction with body  image/size. Target Date: 2023-09-03 Frequency: Biweekly  Progress: 0 Modality: individual  Related Interventions Assign the client to keep a journal of mirror checking (or avoiding looking in the mirror), body measuring, and/or efforts to camouflage her body. Objective Describe eating patterns, including frequency, amounts, and types of food consumed or hoarded. Target Date: 2023-09-03 Frequency: Biweekly  Progress: 0 Modality: individual  Related Interventions Document the client's eating pattern, including the antecedents and consequences of the eating behavior. Objective Eat at regular intervals, consuming foods and beverage that will lead to progressively weight loss. Target Date: 2023-09-03 Frequency: Biweekly  Progress: 0 Modality: individual  Related Interventions Assist the client in identifying cues that lead to unhealthy eating by assigning her to write a daily journal of eating behavior, thoughts, and feelings. Assist the client in developing more adaptive strategies and behaviors (e.g., relaxation, read a book, call a friend) to cope with uncomfortable emotional states. Objective Identify and change distorted self-talk messages associated with eating behavior. Target Date: 2023-09-03 Frequency: Biweekly  Progress: 0 Modality: individual  Related Interventions Assist the client in preparing to cope with future depressive or anxious symptoms and monitoring feelings in order to prevent relapses (e.g., prevent relapse by learning to recognize, respect, and respond to internal bodily cues such as feeling tired, anxious) and recognizing situations that trigger urges to binge, starve, or otherwise abuse food and plan other activities (e.g., engage in a favorite hobby such as listening to music, reading). Challenge the client's basis for construing herself in terms of shape so the concept of self goes beyond body weight; help her define herself in terms of relationships with family,  friends, and spirituality, and in terms of accomplishments, traits,  and abilities. Assist the client in developing internal standards of approval and reduce the need for external approval by brainstorming her positive attributes and qualities (e.g., personality, values, behavior). Objective Implement three new healthy and appropriate strategies to cope with stress, depression, and anxiety rather than using unhealthy eating patterns. Target Date: 2023-09-03 Frequency: Biweekly  Progress: 0 Modality: individual  Related Interventions Encourage the client to seek out situations, jobs, and people that affirm her self-worth and avoid situations or people that are harmful or demeaning; assist her in identifying affirming versus demeaning people and circumstances. Objective Verbalize a definition of self that includes qualities beyond body weight, size, and shape. Target Date: 2023-09-03 Frequency: Biweekly  Progress: 0 Modality: individual    Diagnosis:Major depression, recurrent, chronic (HCC)  Anxiety, with emotional eating  Plan:  -begin using HAALT-B -consult MD at [facility] about how to successfully withdraw from caffeine w/o headaches -continue working on catching negative thoughts -next session will be on Monday, October 15, 2022 at 1pm

## 2022-10-08 ENCOUNTER — Ambulatory Visit: Payer: BC Managed Care – PPO | Admitting: Professional

## 2022-10-12 ENCOUNTER — Other Ambulatory Visit: Payer: Self-pay | Admitting: Physician Assistant

## 2022-10-12 DIAGNOSIS — F32A Depression, unspecified: Secondary | ICD-10-CM

## 2022-10-12 DIAGNOSIS — R600 Localized edema: Secondary | ICD-10-CM

## 2022-10-12 NOTE — Telephone Encounter (Signed)
Requested medication (s) are due for refill today: yes  Requested medication (s) are on the active medication list: yes  Last refill:  08/30/22 #30 1 refills  Future visit scheduled: no   Notes to clinic:   requesting 90 day supply. No refills remain. Do you want to refill for #90 1 refill?     Requested Prescriptions  Pending Prescriptions Disp Refills   hydrochlorothiazide (HYDRODIURIL) 25 MG tablet [Pharmacy Med Name: HYDROCHLOROTHIAZIDE 25MG TABLETS] 90 tablet     Sig: TAKE 1 TABLET(25 MG) BY MOUTH DAILY     Cardiovascular: Diuretics - Thiazide Failed - 10/12/2022 10:54 AM      Failed - Cr in normal range and within 180 days    Creat  Date Value Ref Range Status  08/27/2018 0.86 0.50 - 1.10 mg/dL Final   Creatinine, Ser  Date Value Ref Range Status  06/26/2022 1.03 (H) 0.44 - 1.00 mg/dL Final         Passed - K in normal range and within 180 days    Potassium  Date Value Ref Range Status  06/26/2022 3.8 3.5 - 5.1 mmol/L Final         Passed - Na in normal range and within 180 days    Sodium  Date Value Ref Range Status  06/26/2022 137 135 - 145 mmol/L Final  01/03/2022 141 134 - 144 mmol/L Final         Passed - Last BP in normal range    BP Readings from Last 1 Encounters:  07/24/22 120/68         Passed - Valid encounter within last 6 months    Recent Outpatient Visits           1 month ago Major depression, recurrent, chronic (HCC)   [facility] Mecum, Erin E, PA-C   2 months ago Anxiety and depression   [facility] Mecum, Erin E, PA-C   3 months ago Major depression, recurrent, chronic (HCC)   [facility] Mecum, Erin E, PA-C   9 months ago Hyponatremia   [facility] [name], [name], MD   10 months ago Edema, unspecified type   [facility] [name], [name], MD              Refused Prescriptions Disp Refills   venlafaxine XR (EFFEXOR-XR) 37.5 MG 24 hr capsule [Pharmacy Med Name:  VENLAFAXINE ER 37.5MG  CAPSULES] 30 capsule 1    Sig: TAKE 1 CAPSULE(37.5 MG) BY MOUTH DAILY WITH BREAKFAST     Psychiatry: Antidepressants - SNRI - desvenlafaxine & venlafaxine Failed - 10/12/2022 10:54 AM      Failed - Cr in normal range and within 360 days    Creat  Date Value Ref Range Status  08/27/2018 0.86 0.50 - 1.10 mg/dL Final   Creatinine, Ser  Date Value Ref Range Status  06/26/2022 1.03 (H) 0.44 - 1.00 mg/dL Final         Failed - Lipid Panel in normal range within the last 12 months    Cholesterol, Total  Date Value Ref Range Status  12/27/2021 194 100 - 199 mg/dL Final   LDL Chol Calc (NIH)  Date Value Ref Range Status  12/27/2021 123 (H) 0 - 99 mg/dL Final   HDL  Date Value Ref Range Status  12/27/2021 36 (L) >39 mg/dL Final   Triglycerides  Date Value Ref Range Status  12/27/2021 197 (H) 0 - 149 mg/dL Final  Passed - Completed PHQ-2 or PHQ-9 in the last 360 days      Passed - Last BP in normal range    BP Readings from Last 1 Encounters:  07/24/22 120/68         Passed - Valid encounter within last 6 months    Recent Outpatient Visits           1 month ago Major depression, recurrent, chronic (HCC)   [facility] Mecum, Erin E, PA-C   2 months ago Anxiety and depression   [facility] Mecum, Erin E, PA-C   3 months ago Major depression, recurrent, chronic (HCC)   [facility] Mecum, Erin E, PA-C   9 months ago Hyponatremia   [facility] [name], [name], MD   10 months ago Edema, unspecified type   [facility] [name], MD

## 2022-10-12 NOTE — Telephone Encounter (Signed)
Requested by interface surescripts. Medication discontinued 08/30/22.  Requested Prescriptions  Pending Prescriptions Disp Refills   hydrochlorothiazide (HYDRODIURIL) 25 MG tablet [Pharmacy Med Name: HYDROCHLOROTHIAZIDE 25MG TABLETS] 90 tablet     Sig: TAKE 1 TABLET(25 MG) BY MOUTH DAILY     Cardiovascular: Diuretics - Thiazide Failed - 10/12/2022 10:54 AM      Failed - Cr in normal range and within 180 days    Creat  Date Value Ref Range Status  08/27/2018 0.86 0.50 - 1.10 mg/dL Final   Creatinine, Ser  Date Value Ref Range Status  06/26/2022 1.03 (H) 0.44 - 1.00 mg/dL Final         Passed - K in normal range and within 180 days    Potassium  Date Value Ref Range Status  06/26/2022 3.8 3.5 - 5.1 mmol/L Final         Passed - Na in normal range and within 180 days    Sodium  Date Value Ref Range Status  06/26/2022 137 135 - 145 mmol/L Final  01/03/2022 141 134 - 144 mmol/L Final         Passed - Last BP in normal range    BP Readings from Last 1 Encounters:  07/24/22 120/68         Passed - Valid encounter within last 6 months    Recent Outpatient Visits           1 month ago Major depression, recurrent, chronic (HCC)   [facility] Mecum, Erin E, PA-C   2 months ago Anxiety and depression   [facility] Mecum, Erin E, PA-C   3 months ago Major depression, recurrent, chronic (HCC)   [facility] Mecum, Erin E, PA-C   9 months ago Hyponatremia   [facility] [name], [name], MD   10 months ago Edema, unspecified type   [facility] [name], [name], MD              Refused Prescriptions Disp Refills   venlafaxine XR (EFFEXOR-XR) 37.5 MG 24 hr capsule [Pharmacy Med Name: VENLAFAXINE ER 37.5MG  CAPSULES] 30 capsule 1    Sig: TAKE 1 CAPSULE(37.5 MG) BY MOUTH DAILY WITH BREAKFAST     Psychiatry: Antidepressants - SNRI - desvenlafaxine & venlafaxine Failed - 10/12/2022 10:54 AM      Failed - Cr in normal range  and within 360 days    Creat  Date Value Ref Range Status  08/27/2018 0.86 0.50 - 1.10 mg/dL Final   Creatinine, Ser  Date Value Ref Range Status  06/26/2022 1.03 (H) 0.44 - 1.00 mg/dL Final         Failed - Lipid Panel in normal range within the last 12 months    Cholesterol, Total  Date Value Ref Range Status  12/27/2021 194 100 - 199 mg/dL Final   LDL Chol Calc (NIH)  Date Value Ref Range Status  12/27/2021 123 (H) 0 - 99 mg/dL Final   HDL  Date Value Ref Range Status  12/27/2021 36 (L) >39 mg/dL Final   Triglycerides  Date Value Ref Range Status  12/27/2021 197 (H) 0 - 149 mg/dL Final         Passed - Completed PHQ-2 or PHQ-9 in the last 360 days      Passed - Last BP in normal range    BP Readings from Last 1 Encounters:  07/24/22 120/68         Passed - Valid encounter within last 6  months    Recent Outpatient Visits           1 month ago Major depression, recurrent, chronic (HCC)   [facility] Mecum, Erin E, PA-C   2 months ago Anxiety and depression   [facility] Mecum, Erin E, PA-C   3 months ago Major depression, recurrent, chronic (HCC)   [facility] Mecum, Erin E, PA-C   9 months ago Hyponatremia   [facility] [name], [name], MD   10 months ago Edema, unspecified type   [facility] [name], MD

## 2022-10-15 ENCOUNTER — Encounter: Payer: Self-pay | Admitting: Professional

## 2022-10-15 ENCOUNTER — Ambulatory Visit (INDEPENDENT_AMBULATORY_CARE_PROVIDER_SITE_OTHER): Payer: BC Managed Care – PPO | Admitting: Professional

## 2022-10-15 DIAGNOSIS — F419 Anxiety disorder, unspecified: Secondary | ICD-10-CM | POA: Diagnosis not present

## 2022-10-15 DIAGNOSIS — F339 Major depressive disorder, recurrent, unspecified: Secondary | ICD-10-CM | POA: Diagnosis not present

## 2022-10-15 NOTE — Progress Notes (Signed)
[facility] Counselor/Therapist Progress Note  Patient ID: LAURIANN THATCHER, MRN: [id number],    Date: 10/15/2022  Time Spent: 43 minutes [id number]  Treatment Type: Individual Therapy  Risk Assessment: Danger to Self:  No Self-injurious Behavior: No Danger to Others: No  Subjective: This session was held via video teletherapy. The patient consented to video teletherapy and was located in the counselor's suite during this session. She is aware it is the responsibility of the patient to secure confidentiality on her end of the session. The provider was in a private home office for the duration of this session.    The patient arrived on time for her video appointment.   Issues addressed: 1-homework-completed, pt did not spiral in her thinking and behaviors -begin using HAALT-B -consult MD at [facility] about how to successfully withdraw from caffeine w/o headaches   -use Excedrin Migraine -continue working on catching negative thoughts   -for the most part pt feels it has gone well about her weight issues   -she is anxious about her [place] 2-HAALT-B -has done well -realized that she was doing this already but is helpful to allow her to stop in the moment   Treatment Plan Problems Addressed  Balancing Work and Family/Multiple Roles, Body Image Disturbance/Eating Disorders, Depression, Low Self-Esteem/Lack of Assertiveness Goals 1. Address and eliminate maladaptive thought processes that lead to anxious responses. Objective Verbalize anxiety symptoms as well as when, where, and how frequently they occur; describe attempts to resolve anxiety. Target Date: 2023-09-03 Frequency: Biweekly  Progress: 50 Modality: individual  Related Interventions Explore with the client her current understanding of the external and internal factors that contribute to her anxiety (e.g., gender roles, social relationships/lack of support, socioeconomic  status, trauma, brain chemistry, and hormones). Objective Identify precipitating events, thoughts, feelings, and reactions that are believed to contribute to anxiety. Target Date: 2023-09-03 Frequency: Biweekly  Progress: 85 Modality: individual  Related Interventions Educate the client on the relaxing, calming, and balancing benefits of practices such as yoga, meditation, and prayer; provide suggestions on how to get started (e.g., book, video, local gym class). Aid the client in devising a healthy diet and eating schedule, a pattern of adequate sleep and relaxation, as well as regular cardiovascular exercise. Objective Implement self-care strategies that serve to augment the positive effects of other interventions. Target Date: 2023-09-03 Frequency: Biweekly  Progress: 50 Modality: individual  Related Interventions Elicit the client's specific patterns of thought that contribute to anxious states; aid client in understanding the connection between the identified maladaptive thoughts and anxious feelings. Objective Acknowledge underlying irrational or illogical thought patterns that contribute to anxiety. Target Date: 2023-09-03 Frequency: Biweekly  Progress: 75 Modality: individual  Related Interventions Aid the client in seeing the connection between a focus on helping others or improving society and a reduction in anxiety (i.e., dwelling on maladaptive thoughts). Explore with the client how gender roles have been received and internalized, and in turn how they have contributed to her current anxiety. Objective Self-correct maladaptive thought patterns preemptively in order to lessen or eliminate anxiety at least 90% of the time; increase positive self-talk. Target Date: 2023-09-03 Frequency: Biweekly  Progress: 55 Modality: individual  Related Interventions Collaborate with the client to determine what kind of changes need to be made, internally and externally, for calmness and  satisfaction to exist within the arenas of work, home, and school. Formulate with the client at least three possible solutions to resisting expectations that are limiting and anxiety  producing. Provide the client with examples from the past and present of highly achieved women; encourage the client to articulate positive role models in her own life (e.g., female family members she admires, mentors). 2. Change the definition of self to encompass positive attributes beyond body weight, size, and shape. 3. Develop a realistic perspective regarding demands and obligations of multiple roles and their completion. Objective Learn and implement problem-solving and conflict-resolution skills. Target Date: 2023-09-03 Frequency: Biweekly  Progress: 90 Modality: individual  Related Interventions Educate the client about, and encourage her to attend to, healthy eating, sleeping, and exercise. Facilitate the client's development of a social support network to assist with multiple roles and responsibilities; develop a list of active support behaviors (e.g., babysitting, help with cleaning). Refer and encourage the client to join relevant support groups (e.g., single mothers, overextended women, time management). Assign the client to conduct family meetings regularly (e.g., possibly biweekly or monthly) with children (ages 27 and up) and partner to delegate household responsibilities. Objective Learn and implement stress management and relaxation techniques to reduce fatigue, anxiety, and depressive symptoms. Target Date: 2023-09-03 Frequency: Biweekly  Progress: 50 Modality: individual  Related Interventions Explore, along with the client, strategies for making her life more manageable and less stressful (e.g., waking up before the children to exercise or have a cup of tea, pack the children's lunches and backpacks the night before). Objective Describe role and responsibilities associated with work and family  and related thoughts, feelings, and behaviors. Target Date: 2023-09-03 Frequency: Biweekly  Progress: 50 Modality: individual  Related Interventions Assist the client in identifying the causes and consequences of at least two internal and two external expectations associated with balancing work and family roles. Explore with the client her multiple roles and responsibilities associated with work and family; clarify related thoughts and feelings. Explore with the client her expectations regarding the balance between work and family, and discuss how this relates to internalized gender role stereotypes. Objective Implement assertiveness skills and limit setting. Target Date: 2023-09-03 Frequency: Biweekly  Progress: 80 Modality: individual  Related Interventions Use behavioral techniques (i.e., education, modeling, role-playing, corrective feedback, positive reinforcement) to teach communication skills, including assertive communication, offering positive feedback, active listening, making positive requests of others for behavior change, and giving negative feedback in an honest and respectful manner. Objective Identify at least five practical ways to manage stress associated with multiple demands. Target Date: 2023-09-03 Frequency: Biweekly  Progress: 75 Modality: individual  Objective Commit to a healthy eating, sleeping, and exercise routine. Target Date: 2023-09-03 Frequency: Biweekly  Progress: 60 Modality: individual  Objective Clarify values and priorities. Target Date: 2023-09-03 Frequency: Biweekly  Progress: 70 Modality: individual  Related Interventions Assign the client to read about progressive muscle relaxation and other calming strategies in relevant books or treatment manuals (e.g., Progressive Relaxation Training by Leroy Kennedy; Mastery of Your Anxiety and Conservator, museum/gallery by Birdie Hopes). Objective Identify and replace internalized  expectations and standards regarding multiple roles that are potentially harmful. Target Date: 2023-09-03 Frequency: Biweekly  Progress: 30 Modality: individual  Related Interventions Help the client to explore and clarify her own values and priorities; encourage her to reduce the psychological importance placed on one or more roles to reduce the level of conflict. Use modeling and/or role-playing to train the client in assertiveness; if indicated, refer her to an assertiveness training class/group for further instruction (see Low Self-Esteem/Lack of Assertiveness chapter in this Planner). Teach the client relaxation skills (e.g., progressive muscle relaxation,  guided imagery, slow diaphragmatic breathing) and how to discriminate better between relaxation and tension; teach the client how to apply these skills to her daily life (e.g., Progressive Relaxation Training by Twana First; Treating GAD: Evidence-Based Strategies, Tools, and Techniques by Rygh and Ida Rogue). Objective Communicate needs with partner regarding multiple role obligations. Target Date: 2023-09-03 Frequency: Biweekly  Progress: 20 Modality: individual  Related Interventions Help the client to develop a realistic schedule that outlines the responsibilities of her partner and family members; help the client enlist the commitment of all individuals to the schedule. Objective Generate a list of self-care activities and make a commitment to regularly participate in such activities. Target Date: 2023-09-03 Frequency: Biweekly  Progress: 65 Modality: individual  4. Develop a support system to assist with multiple roles and responsibilities. 5. Develop an awareness of internalized unrealistic and narrow standards of women's beauty and learn to challenge such beliefs. 6. Develop effective coping strategies to deal with interpersonal stressors and emotional issues. 7. Develop healthy cognitive mechanisms to facilitate  positive attitudes and beliefs about self within the context of one's environment to mitigate depressive symptoms. 8. Develop time management strategies to reduce role overload and role conflicts. 9. Eliminate depressive and anxiety symptoms associated with trying to balance multiple roles. 10. Enhance ability to handle effectively life stressors. 11. Identify and increase intrapersonal, interpersonal, and physical resources to foster positive coping strategies. 12. Improve depressed mood to maximize effective social, occupational, and physical functioning. Objective Articulate signs and symptoms of depression in current life experiences. Target Date: 2023-09-03 Frequency: Biweekly  Progress: 75 Modality: individual  Related Interventions Evaluate historical influences on current depressive symptoms, including previous episodes of depression, treatment history, and family history. Encourage the client to describe current and childhood experiences associated with key relationships (e.g., family-of-origin, peer and school relationships, dating relationships, female role models, extended family relationships), roles (e.g., partner, caretaker, worker, Consulting civil engineer), and cultural experiences (e.g., gender roles) that may contribute to depression. Encourage the client to share her feelings of depression to gain an insight into precipitating events and implications of symptoms; normalize her feelings of depression. Objective Identify and replace cognitive self-talk that supports depression. Target Date: 2023-09-03 Frequency: Biweekly  Progress: 60 Modality: individual  Related Interventions Reinforce the client's positive, reality-based cognitive messages that enhance self-confidence and increase adaptive action (see "Positive Self-Talk" in the Adult Psychotherapy Homework Planner, 2nd ed. by Stephannie Li). Do "behavioral experiments" in which depressive automatic thoughts are treated as hypotheses/predictions,  reality-based alternative hypotheses/ predictions are generated, and both are tested against the client's past, present, and/or future experiences. Assign the client to keep a daily journal of automatic thoughts associated with depressive feelings (e.g., "Negative Thoughts Trigger Negative Feelings" in the Adult Psychotherapy Homework Planner, 2nd ed. by Stephannie Li; "Daily Record of Dysfunctional Thoughts" in Cognitive Therapy of Depression by Ashby Dawes and Shea Evans); process the journal material to challenge depressive thinking patterns and replace them with reality-based thoughts. Encourage the client to discuss cognitive distortions, including automatic thoughts (e.g., negative view of self, future, experience) and negative schemas (e.g., core beliefs about self and others based on earlier childhood experiences); assess frequency of negative self-statements associated with depression. Objective Increase the frequency of engaging in pleasant activities. Target Date: 2023-09-03 Frequency: Biweekly  Progress: 25 Modality: individual  Related Interventions Assign the client reading materials regarding overcoming depression for women (e.g., Women and Depression: A Practical Self-Help Guide by Allyne Gee; Feeling Good by Lawerance Bach; Women & Depression by Collier Bullock; Silencing the Self: Women and  Depression by Barnabas Lister). Monitor and encourage the client to attend to personal grooming and healthy sleeping and eating patterns; reinforce improvement. Objective Implement conflict resolution skills to alleviate interpersonal sources of depressed mood. Target Date: 2023-09-03 Frequency: Biweekly  Progress: 50 Modality: individual  Objective Articulate self-care methods to prevent and/or manage depression in the future; create a list of 10 coping methods for future use. Target Date: 2023-09-03 Frequency: Biweekly  Progress: 50 Modality: individual  Objective Develop healthy sleeping, eating, and grooming  habits. Target Date: 2023-09-03 Frequency: Biweekly  Progress: 50 Modality: individual  Related Interventions Teach the client stress management techniques (e.g., progressive muscle relaxation, deep breathing, guided imagery, spirituality). Provide assertiveness training, role-playing with the client different ways of coping with and addressing situational stress; refer her to structured assertiveness training classes if necessary. Assist the client to reevaluate priorities shaping her daily life, encouraging her to set limits on the scope of everyday activities (e.g., caretaking, employment, division of household labor, schoolwork) to minimize stress in a way that facilitates personal empowerment. Objective Increase the level of physical exercise. Target Date: 2023-09-03 Frequency: Biweekly  Progress: 30 Modality: individual  13. Improve self-esteem and develop a positive self-image as capable and competent. 14. Increase ability to express needs and desires openly and honestly. 15. Increase assertiveness skills and ability to advocate for self. 16. Increase involvement in activities that foster confidence and a sense of accomplishment. 17. Increase openness to experiences and opportunities associated with risk-taking. 18. Increase overall sense of well-being via reduction/elimination of anxiety. 89. Maintain a balance between the multiple demands of motherhood, work, and other life roles and responsibilities. 20. Recognize the role of precipitating factors such as social oppression, gender roles, prior trauma, and learned behavior in the perpetuation of anxiety. 21. Reduce self-disparaging remarks and negative self-talk. Objective Acknowledge self-disparaging statements and recognize the tendency to engage in such statements. Target Date: 2023-09-03 Frequency: Biweekly  Progress: 50 Modality: individual  Related Interventions Ask the client to complete and process an exercise in the book  Ten Days to Self-Esteem! Quay Burow). Objective Decrease the frequency of making self-disparaging remarks. Target Date: 2023-09-03 Frequency: Biweekly  Progress: 70 Modality: individual  Related Interventions Ask the client to make a list including positive qualities about herself and accomplishments; verbally reinforce positive self-statements. Encourage the client to make at least one positive statement about herself during the therapy session. Assist the client in developing a list of positive affirmations to be read three times a day or to be put on a mirror at home. Objective Verbalize an understanding of the differences between passive, assertive, and aggressive behavior. Target Date: 2023-09-03 Frequency: Biweekly  Progress: 60 Modality: individual  Related Interventions Encourage participation in physical activities that foster confidence and well-being (e.g., yoga, meditation, martial arts). Assist the client in identifying intrapersonal and interpersonal obstacles to self-esteem (e.g., self-deprecating comments, perfectionism, unhealthy relationships). Objective Identify three anxiety-related fears associated with being assertive. Target Date: 2023-09-03 Frequency: Biweekly  Progress: 60 Modality: individual  Related Interventions Explore with the client the messages she received growing up regarding appropriate behavior for girls and women and the degree to which she internalized these messages. Encourage the client to keep a journal in which she writes down her positive experiences and tracks her progress in building her self-esteem. Assist the client with identifying gender role messages that are empowering and those that are limiting to her self-worth and assertiveness; facilitate her identifying and developing her own values. Objective Identify three roadblocks to improved self-esteem. Target  Date: 2023-09-03 Frequency: Biweekly  Progress: 60 Modality: individual  Related  Interventions Encourage the client to practice both in and out of the session the assertive behaviors that she was exposed to via the model. Utilize role-play and in-session rehearsal to teach and assess the client's assertiveness skills; reinforce progress. Objective Practice saying "no" to at least one person, declining to comply with a request/favor or identify and assert rights, needs, and wants. Target Date: 2023-09-03 Frequency: Biweekly  Progress: 90 Modality: individual  Objective Identify at least three positive personal attributes. Target Date: 2023-09-03 Frequency: Biweekly  Progress: 70 Modality: individual  Related Interventions Explore areas of competency with the client and encourage her to engage in related activities (e.g., dance, singing, arts, volunteer work, joining a book club). Explore the client's fears of being assertive (e.g., fear of rejection, fear of ineffectiveness, fear of ridicule). Assign the client to read Your Perfect Right: Assertiveness and Equality in Your Life and Relationships by Carnella Guadalajara; process the content to clarify the distinction between passivity, assertiveness, and aggression. 22. Resolve issues involving low self-esteem or low self-efficacy that contribute to anxiety. 23. Resolve the core conflict that is the source of anxiety. 24. Terminate the pattern of binge eating and consume more healthy choices leading to weight loss, decreased physical limitations, and improved self-esteem. Objective Admit to a persistent preoccupation and dissatisfaction with body image/size. Target Date: 2023-09-03 Frequency: Biweekly  Progress: 100 Modality: individual  Related Interventions Assign the client to keep a journal of mirror checking (or avoiding looking in the mirror), body measuring, and/or efforts to camouflage her body. Objective Describe eating patterns, including frequency, amounts, and types of food consumed or hoarded. Target Date:  2023-09-03 Frequency: Biweekly  Progress: 50 Modality: individual  Related Interventions Document the client's eating pattern, including the antecedents and consequences of the eating behavior. Objective Eat at regular intervals, consuming foods and beverage that will lead to progressively weight loss. Target Date: 2023-09-03 Frequency: Biweekly  Progress: 30 Modality: individual  Related Interventions Assist the client in identifying cues that lead to unhealthy eating by assigning her to write a daily journal of eating behavior, thoughts, and feelings. Assist the client in developing more adaptive strategies and behaviors (e.g., relaxation, read a book, call a friend) to cope with uncomfortable emotional states. Objective Identify and change distorted self-talk messages associated with eating behavior. Target Date: 2023-09-03 Frequency: Biweekly  Progress: 30 Modality: individual  Related Interventions Assist the client in preparing to cope with future depressive or anxious symptoms and monitoring feelings in order to prevent relapses (e.g., prevent relapse by learning to recognize, respect, and respond to internal bodily cues such as feeling tired, anxious) and recognizing situations that trigger urges to binge, starve, or otherwise abuse food and plan other activities (e.g., engage in a favorite hobby such as listening to music, reading). Challenge the client's basis for construing herself in terms of shape so the concept of self goes beyond body weight; help her define herself in terms of relationships with family, friends, and spirituality, and in terms of accomplishments, traits, and abilities. Assist the client in developing internal standards of approval and reduce the need for external approval by brainstorming her positive attributes and qualities (e.g., personality, values, behavior). Objective Implement three new healthy and appropriate strategies to cope with stress, depression, and  anxiety rather than using unhealthy eating patterns. Target Date: 2023-09-03 Frequency: Biweekly  Progress: 50 Modality: individual  Related Interventions Encourage the client to seek out situations, jobs, and people  that affirm her self-worth and avoid situations or people that are harmful or demeaning; assist her in identifying affirming versus demeaning people and circumstances. Objective Verbalize a definition of self that includes qualities beyond body weight, size, and shape. Target Date: 2023-09-03 Frequency: Biweekly  Progress: 30 Modality: individual    Diagnosis:Major depression, recurrent, chronic (HCC)  Anxiety, with emotional eating  Plan:  -pt opted not to reschedule due to cost and beginning new year and new copays/deductibles. -pt informed how to access my services in the future as needed. -pt provided some handouts to assist her in addressing increased coping skills and values self-exploration.

## 2022-10-18 ENCOUNTER — Other Ambulatory Visit (HOSPITAL_COMMUNITY): Payer: Self-pay

## 2022-10-18 ENCOUNTER — Other Ambulatory Visit: Payer: Self-pay | Admitting: Physician Assistant

## 2022-10-18 ENCOUNTER — Encounter (HOSPITAL_COMMUNITY): Payer: Self-pay | Admitting: Hematology

## 2022-10-18 ENCOUNTER — Other Ambulatory Visit: Payer: Self-pay

## 2022-10-18 DIAGNOSIS — G4709 Other insomnia: Secondary | ICD-10-CM

## 2022-10-18 MED ORDER — TRAZODONE HCL 100 MG PO TABS
100.0000 mg | ORAL_TABLET | Freq: Every day | ORAL | 0 refills | Status: DC
  Filled 2022-10-18: qty 90, 90d supply, fill #0

## 2022-10-18 MED ORDER — ALBUTEROL SULFATE HFA 108 (90 BASE) MCG/ACT IN AERS
2.0000 | INHALATION_SPRAY | Freq: Four times a day (QID) | RESPIRATORY_TRACT | 0 refills | Status: DC | PRN
  Filled 2022-10-18: qty 13.4, fill #0

## 2022-10-18 NOTE — Telephone Encounter (Signed)
From: [name] To: Office of [name], MD Sent: 10/18/2022 2:11 PM EST Subject: Medication Renewal Request  Refills have been requested for the following medications:   albuterol (VENTOLIN HFA) 108 (90 Base) MCG/ACT inhaler  Patient Comment: I have covid and it may be helpful for me to have an inhaler. My last one is running low  Preferred pharmacy: [facility] Delivery method: Mail   Medication renewals requested in this message routed separately:   traZODone (DESYREL) 100 MG tablet [[name]]  Patient Comment: I will finish with my 90 day prescription in early January

## 2022-10-25 ENCOUNTER — Encounter: Payer: Self-pay | Admitting: Internal Medicine

## 2022-10-30 ENCOUNTER — Telehealth: Payer: BC Managed Care – PPO | Admitting: Physician Assistant

## 2022-11-01 ENCOUNTER — Ambulatory Visit: Payer: BC Managed Care – PPO | Admitting: Professional

## 2022-11-22 ENCOUNTER — Other Ambulatory Visit: Payer: Self-pay

## 2023-01-05 ENCOUNTER — Other Ambulatory Visit: Payer: Self-pay | Admitting: Physician Assistant

## 2023-01-05 DIAGNOSIS — R600 Localized edema: Secondary | ICD-10-CM

## 2023-01-07 NOTE — Telephone Encounter (Signed)
Patient is overdue for an appointment. Please call to schedule and then route to provider for refill.  

## 2023-01-07 NOTE — Telephone Encounter (Signed)
Requested medication (s) are due for refill today: yes  Requested medication (s) are on the active medication list: yes  Last refill:  no  Future visit scheduled: yes  Notes to clinic:  overdue lab work   Requested Prescriptions  Pending Prescriptions Disp Refills   hydrochlorothiazide (HYDRODIURIL) 25 MG tablet [Pharmacy Med Name: HYDROCHLOROTHIAZIDE '25MG'$ TABLETS] 30 tablet 1    Sig: TAKE 1 TABLET(25 MG) BY MOUTH DAILY     Cardiovascular: Diuretics - Thiazide Failed - 01/05/2023 10:54 AM      Failed - Cr in normal range and within 180 days    Creat  Date Value Ref Range Status  08/27/2018 0.86 0.50 - 1.10 mg/dL Final   Creatinine, Ser  Date Value Ref Range Status  06/26/2022 1.03 (H) 0.44 - 1.00 mg/dL Final         Failed - K in normal range and within 180 days    Potassium  Date Value Ref Range Status  06/26/2022 3.8 3.5 - 5.1 mmol/L Final         Failed - Na in normal range and within 180 days    Sodium  Date Value Ref Range Status  06/26/2022 137 135 - 145 mmol/L Final  01/03/2022 141 134 - 144 mmol/L Final         Passed - Last BP in normal range    BP Readings from Last 1 Encounters:  07/24/22 120/68         Passed - Valid encounter within last 6 months    Recent Outpatient Visits           4 months ago Major depression, recurrent, chronic ([place])   [place] [facility] Mecum, Dani Gobble, PA-C   5 months ago Anxiety and depression   [place] [facility] Mecum, Erin E, PA-C   6 months ago Major depression, recurrent, chronic ([place])   [place], Dani Gobble, PA-C   1 year ago Hyponatremia   [place] [facility] [name], [name], MD   1 year ago Edema, unspecified type   [place] [facility] Charlynne Cousins, MD

## 2023-01-08 NOTE — Telephone Encounter (Signed)
Called the pt to get her scheduled and she was driving and stated that she will give the office a call back to get scheduled.

## 2023-01-11 ENCOUNTER — Encounter: Payer: Self-pay | Admitting: Nurse Practitioner

## 2023-01-11 ENCOUNTER — Other Ambulatory Visit: Payer: Self-pay | Admitting: Nurse Practitioner

## 2023-01-11 ENCOUNTER — Telehealth (INDEPENDENT_AMBULATORY_CARE_PROVIDER_SITE_OTHER): Payer: BC Managed Care – PPO | Admitting: Nurse Practitioner

## 2023-01-11 DIAGNOSIS — F339 Major depressive disorder, recurrent, unspecified: Secondary | ICD-10-CM

## 2023-01-11 DIAGNOSIS — R600 Localized edema: Secondary | ICD-10-CM | POA: Diagnosis not present

## 2023-01-11 DIAGNOSIS — G43009 Migraine without aura, not intractable, without status migrainosus: Secondary | ICD-10-CM

## 2023-01-11 DIAGNOSIS — G4709 Other insomnia: Secondary | ICD-10-CM | POA: Diagnosis not present

## 2023-01-11 DIAGNOSIS — M25561 Pain in right knee: Secondary | ICD-10-CM

## 2023-01-11 DIAGNOSIS — Z6841 Body Mass Index (BMI) 40.0 and over, adult: Secondary | ICD-10-CM

## 2023-01-11 DIAGNOSIS — G8929 Other chronic pain: Secondary | ICD-10-CM

## 2023-01-11 MED ORDER — ALBUTEROL SULFATE HFA 108 (90 BASE) MCG/ACT IN AERS: 2.0000 | INHALATION_SPRAY | Freq: Four times a day (QID) | RESPIRATORY_TRACT | 0 refills | Status: AC | PRN

## 2023-01-11 MED ORDER — TRAZODONE HCL 100 MG PO TABS: 100.0000 mg | ORAL_TABLET | Freq: Every day | ORAL | 0 refills | Status: DC

## 2023-01-11 MED ORDER — ATORVASTATIN CALCIUM 40 MG PO TABS: 40.0000 mg | ORAL_TABLET | Freq: Every day | ORAL | 0 refills | Status: DC

## 2023-01-11 MED ORDER — BUTALBITAL-APAP-CAFFEINE 50-325-40 MG PO TABS: 1.0000 | ORAL_TABLET | Freq: Four times a day (QID) | ORAL | 0 refills | Status: DC | PRN

## 2023-01-11 MED ORDER — GABAPENTIN 300 MG PO CAPS: 300.0000 mg | ORAL_CAPSULE | Freq: Three times a day (TID) | ORAL | 0 refills | Status: DC

## 2023-01-11 NOTE — Progress Notes (Signed)
LVM to call office to get scheduled for a mychart video visit, also put in a CRM

## 2023-01-11 NOTE — Assessment & Plan Note (Signed)
Chronic. On Lamictal 150mg .  Dose was just increased yesterday.  Patient is working with [employer] for her mental health and weight loss.  Follow up in 1 month in person for physical and fasting labs.

## 2023-01-11 NOTE — Progress Notes (Signed)
LMP 11/07/2012    Subjective:    Patient ID: [name], female    DOB: 1976/03/26, 47 y.o.   MRN: [id number]  HPI: [name] is a 47 y.o. female  Chief Complaint  Patient presents with   Anxiety   Depression   DEPRESSION Patient states she is seen at [facility] who is prescribing the [employer].  She does get blood work done with [employer].  They are prescribing her Lamictal.  She was previously having suicidal thoughts but no longer having those with the Lamictal.  She has a lot of stress with work.  She does feel like increasing the Lamictal has been helpful for her mood.  Her weight has been a trigger for her but she is working with [employer] for that as well.  Denies si.   Flowsheet Row Video Visit from 01/11/2023 in [facility]  PHQ-9 Total Score 11         01/11/2023   10:05 AM 09/03/2022    1:13 PM 08/30/2022    9:10 AM 07/24/2022   11:06 AM  GAD 7 : Generalized Anxiety Score  Nervous, Anxious, on Edge 2  1 2   Control/stop worrying 1  1 0  Worry too much - different things 1  1 1   Trouble relaxing 2  0 1  Restless 0  1 0  Easily annoyed or irritable 0  1 0  Afraid - awful might happen 0  0 0  Total GAD 7 Score 6  5 4   Anxiety Difficulty Not difficult at all  Somewhat difficult Somewhat difficult     Information is confidential and restricted. Go to Review Flowsheets to unlock data.      Relevant past medical, surgical, family and social history reviewed and updated as indicated. Interim medical history since our last visit reviewed. Allergies and medications reviewed and updated.  Review of Systems  Constitutional:  Positive for unexpected weight change.  Psychiatric/Behavioral:  Positive for dysphoric mood. Negative for suicidal ideas. The patient is nervous/anxious.     Per HPI unless specifically indicated above     Objective:    LMP 11/07/2012   Wt Readings from Last 3 Encounters:  08/30/22 (!)  308 lb (139.7 kg)  07/24/22 (!) 316 lb (143.3 kg)  07/09/22 (!) 314 lb (142.4 kg)    Physical Exam Vitals and nursing note reviewed.  Constitutional:      General: She is not in acute distress.    Appearance: She is not ill-appearing.  HENT:     Head: Normocephalic.     Right Ear: Hearing normal.     Left Ear: Hearing normal.     Nose: Nose normal.  Pulmonary:     Effort: Pulmonary effort is normal. No respiratory distress.  Neurological:     Mental Status: She is alert.  Psychiatric:        Mood and Affect: Mood normal.        Behavior: Behavior normal.        Thought Content: Thought content normal.        Judgment: Judgment normal.     Results for orders placed or performed during the hospital encounter of 06/28/22  Surgical pathology  Result Value Ref Range   SURGICAL PATHOLOGY      SURGICAL PATHOLOGY CASE: [id number] PATIENT: [place] Surgical Pathology Report     Clinical History: RLQ abd pain, abnormal CT stomach, duodenum jejunum  FINAL MICROSCOPIC DIAGNOSIS:  A. DUODENUM, BIOPSY: - Duodenal mucosa with normal villous architecture. - No villous atrophy or increased intraepithelial lymphocytes.    GROSS DESCRIPTION:  Received in formalin are tan, soft tissue fragments that are submitted in toto. Number: 4 size: Range from 0.2 to 0.3 cm blocks: 1 Craig Staggers 06/28/2022)    Final Diagnosis performed by Claudette Laws, MD.   Electronically signed 06/29/2022 Technical component performed at [facility], [place] [address], [place], [place] [address].  Professional component performed at [employer]. [facility], [place] [address], [place], [place] [address].  Immunohistochemistry Technical component (if applicable) was performed at [facility]. [address], [address], [place], [place] [address].   IMMUNOHISTOCHEMISTRY DISCLAIMER (if applicable): Some of these immunohistochemical stains may have been developed  and the performance characteristics determine by [facility]. Some may not have been cleared or approved by the U.S. Food and Drug Administration. The FDA has determined that such clearance or approval is not necessary. This test is used for clinical purposes. It should not be regarded as investigational or for research. This laboratory is certified under the [place] (CLIA-88) as qualified to perform high complexity clinical laboratory testing.  The controls stained appropriately.       Assessment & Plan:   Problem List Items Addressed This Visit       Cardiovascular and Mediastinum   Migraine without aura and without status migrainosus, not intractable   Relevant Medications   lamoTRIgine (LAMICTAL) 150 MG tablet   traZODone (DESYREL) 100 MG tablet   atorvastatin (LIPITOR) 40 MG tablet   gabapentin (NEURONTIN) 300 MG capsule   butalbital-acetaminophen-caffeine (FIORICET) 50-325-40 MG tablet     Other   Morbid obesity with BMI of 50.0-59.9, adult ([place]) - Primary (Chronic)    Chronic. On Lamictal 150mg .  Dose was just increased yesterday.  Patient is working with [employer] for her mental health and weight loss.  Follow up in 1 month in person for physical and fasting labs.      Bilateral lower extremity edema (Chronic)   Major depression, recurrent, chronic ([facility])    Chronic. On Lamictal 150mg .  Dose was just increased yesterday.  Patient is working with [employer] for her mental health and weight loss.  Follow up in 1 month in person for physical and fasting labs.      Relevant Medications   traZODone (DESYREL) 100 MG tablet   Chronic pain of right knee   Relevant Medications   gabapentin (NEURONTIN) 300 MG capsule   Other Visit Diagnoses     Other insomnia       Relevant Medications   traZODone (DESYREL) 100 MG tablet        Follow up plan: Return in about 1 month (around 02/11/2023) for Physical and  Fasting labs.  This visit was completed via MyChart due to the restrictions of the COVID-19 pandemic. All issues as above were discussed and addressed. Physical exam was done as above through visual confirmation on MyChart. If it was felt that the patient should be evaluated in the office, they were directed there. The patient verbally consented to this visit. Location of the patient: Home Location of the provider: Office Those involved with this call:  Provider: Jon Billings, NP CMA: Yvonna Alanis, CMA Front Desk/Registration: Lynnell Catalan This encounter was conducted via video.  I spent 20 dedicated to the care of this patient on the date of this encounter  to include previsit review of symptoms, plan of care and follow up, face to face time with the patient, and post visit ordering of testing.

## 2023-01-11 NOTE — Telephone Encounter (Signed)
Pt has a mychart appointment 01/11/2023 at 10:00 with the provider.

## 2023-01-11 NOTE — Telephone Encounter (Signed)
Requested medication (s) are due for refill today: lipitor signed today   Requested medication (s) are on the active medication list: yes   Last refill:  lipitor 01/11/23 #30 0 refills hydrodiuril 08/30/22 #30 1 refills  Future visit scheduled: yes in 1 month   Notes to clinic:  requesting 90 day supply for lipitor last labs 12/27/21. Do you want to refill for #90? Protocol failed last labs 06/26/22 . Do you want to refill hydrodiuril?     Requested Prescriptions  Pending Prescriptions Disp Refills   atorvastatin (LIPITOR) 40 MG tablet [Pharmacy Med Name: ATORVASTATIN 40MG  TABLETS] 90 tablet 1    Sig: TAKE 1 TABLET(40 MG) BY MOUTH DAILY     Cardiovascular:  Antilipid - Statins Failed - 01/11/2023  2:05 PM      Failed - Lipid Panel in normal range within the last 12 months    Cholesterol, Total  Date Value Ref Range Status  12/27/2021 194 100 - 199 mg/dL Final   LDL Chol Calc (NIH)  Date Value Ref Range Status  12/27/2021 123 (H) 0 - 99 mg/dL Final   HDL  Date Value Ref Range Status  12/27/2021 36 (L) >39 mg/dL Final   Triglycerides  Date Value Ref Range Status  12/27/2021 197 (H) 0 - 149 mg/dL Final         Passed - Patient is not pregnant      Passed - Valid encounter within last 12 months    Recent Outpatient Visits           Today Morbid obesity with BMI of 50.0-59.9, adult ([place])   [place] Jon Billings, NP   4 months ago Major depression, recurrent, chronic ([place])   [place] [facility] Mecum, Dani Gobble, PA-C   5 months ago Anxiety and depression   [place] [facility] Mecum, Erin E, PA-C   6 months ago Major depression, recurrent, chronic ([place])   [place], Dani Gobble, PA-C   1 year ago Hyponatremia   [place], MD       Future Appointments             In 1 month Jon Billings, NP [place]              hydrochlorothiazide (HYDRODIURIL) 25 MG tablet 90 tablet 1    Sig: Take 1 tablet (25 mg total) by mouth daily.     Cardiovascular: Diuretics - Thiazide Failed - 01/11/2023  2:05 PM      Failed - Cr in normal range and within 180 days    Creat  Date Value Ref Range Status  08/27/2018 0.86 0.50 - 1.10 mg/dL Final   Creatinine, Ser  Date Value Ref Range Status  06/26/2022 1.03 (H) 0.44 - 1.00 mg/dL Final         Failed - K in normal range and within 180 days    Potassium  Date Value Ref Range Status  06/26/2022 3.8 3.5 - 5.1 mmol/L Final         Failed - Na in normal range and within 180 days    Sodium  Date Value Ref Range Status  06/26/2022 137 135 - 145 mmol/L Final  01/03/2022 141 134 - 144 mmol/L Final         Passed - Last BP in normal range    BP Readings from Last 1 Encounters:  07/24/22 120/68         Passed - Valid encounter within last 6 months    Recent Outpatient Visits           Today Morbid obesity with BMI of 50.0-59.9, adult ([place])   [place] Saenz, NP   4 months ago Major depression, recurrent, chronic ([place])   [place], Dani Gobble, PA-C   5 months ago Anxiety and depression   [place] [facility] Mecum, Dani Gobble, PA-C   6 months ago Major depression, recurrent, chronic ([place])   [place], Dani Gobble, PA-C   1 year ago Hyponatremia   [place], MD       Future Appointments             In 1 month Jon Billings, NP [place]

## 2023-01-21 NOTE — Progress Notes (Signed)
Pt rescheduled

## 2023-01-22 ENCOUNTER — Ambulatory Visit: Payer: BC Managed Care – PPO | Admitting: Internal Medicine

## 2023-01-22 DIAGNOSIS — Z91199 Patient's noncompliance with other medical treatment and regimen due to unspecified reason: Secondary | ICD-10-CM

## 2023-02-11 ENCOUNTER — Ambulatory Visit (INDEPENDENT_AMBULATORY_CARE_PROVIDER_SITE_OTHER): Payer: BC Managed Care – PPO | Admitting: Nurse Practitioner

## 2023-02-11 ENCOUNTER — Encounter: Payer: Self-pay | Admitting: Nurse Practitioner

## 2023-02-11 VITALS — BP 126/78 | HR 73 | Temp 97.9°F | Ht 65.5 in | Wt 302.5 lb

## 2023-02-11 DIAGNOSIS — G4709 Other insomnia: Secondary | ICD-10-CM

## 2023-02-11 DIAGNOSIS — Z6841 Body Mass Index (BMI) 40.0 and over, adult: Secondary | ICD-10-CM

## 2023-02-11 DIAGNOSIS — R7303 Prediabetes: Secondary | ICD-10-CM

## 2023-02-11 DIAGNOSIS — D508 Other iron deficiency anemias: Secondary | ICD-10-CM

## 2023-02-11 DIAGNOSIS — G8929 Other chronic pain: Secondary | ICD-10-CM

## 2023-02-11 DIAGNOSIS — E782 Mixed hyperlipidemia: Secondary | ICD-10-CM

## 2023-02-11 DIAGNOSIS — M25561 Pain in right knee: Secondary | ICD-10-CM

## 2023-02-11 DIAGNOSIS — E559 Vitamin D deficiency, unspecified: Secondary | ICD-10-CM | POA: Diagnosis not present

## 2023-02-11 DIAGNOSIS — F339 Major depressive disorder, recurrent, unspecified: Secondary | ICD-10-CM

## 2023-02-11 MED ORDER — ATORVASTATIN CALCIUM 40 MG PO TABS: 40.0000 mg | ORAL_TABLET | Freq: Every day | ORAL | 1 refills | Status: DC

## 2023-02-11 MED ORDER — GABAPENTIN 300 MG PO CAPS
300.0000 mg | ORAL_CAPSULE | Freq: Three times a day (TID) | ORAL | 1 refills | Status: AC
Start: 2023-02-11 — End: ?

## 2023-02-11 MED ORDER — TRAZODONE HCL 100 MG PO TABS
100.0000 mg | ORAL_TABLET | Freq: Every day | ORAL | 1 refills | Status: DC
Start: 2023-02-11 — End: 2024-09-23

## 2023-02-11 NOTE — Assessment & Plan Note (Signed)
Labs ordered at visit today.  Will make recommendations based on lab results.   

## 2023-02-11 NOTE — Assessment & Plan Note (Signed)
Recommended eating smaller high protein, low fat meals more frequently and exercising 30 mins a day 5 times a week with a goal of 10-15lb weight loss in the next 3 months.  Working with provider at [facility] for weight loss. Has lost 6lbs since last visit.

## 2023-02-11 NOTE — Progress Notes (Signed)
BP 126/78   Pulse 73   Temp 97.9 F (36.6 C) (Oral)   Ht 5' 5.5" (1.664 m)   Wt (!) 302 lb 8 oz (137.2 kg)   LMP 11/07/2012   SpO2 94%   BMI 49.57 kg/m    Subjective:    Patient ID: [name], female    DOB: 1976/01/08, 47 y.o.   MRN: [id number]  HPI: [name] is a 47 y.o. female  Chief Complaint  Patient presents with   Annual Exam   ANEMIA Anemia status: controlled Etiology of anemia: Duration of anemia treatment:  Compliance with treatment: excellent compliance Iron supplementation side effects: yes Severity of anemia: mild Fatigue: yes Decreased exercise tolerance: no  Dyspnea on exertion: no Palpitations: no Bleeding: no Pica: no  MOOD Patient states she is working with [facility] on her depression.  She is now taking the Lamictal 150mg  is working better for her than the 75mg .  Flowsheet Row Office Visit from 02/11/2023 in [facility]  PHQ-9 Total Score 8         02/11/2023   11:05 AM 01/11/2023   10:05 AM 09/03/2022    1:13 PM 08/30/2022    9:10 AM  GAD 7 : Generalized Anxiety Score  Nervous, Anxious, on Edge 1 2  1   Control/stop worrying 1 1  1   Worry too much - different things 0 1  1  Trouble relaxing 2 2  0  Restless 0 0  1  Easily annoyed or irritable 2 0  1  Afraid - awful might happen 1 0  0  Total GAD 7 Score 7 6  5   Anxiety Difficulty Somewhat difficult Not difficult at all  Somewhat difficult     Information is confidential and restricted. Go to Review Flowsheets to unlock data.      Relevant past medical, surgical, family and social history reviewed and updated as indicated. Interim medical history since our last visit reviewed. Allergies and medications reviewed and updated.  Review of Systems  Constitutional:  Negative for fatigue.  Respiratory:  Negative for shortness of breath.   Cardiovascular:  Negative for palpitations.    Per HPI unless specifically indicated above      Objective:    BP 126/78   Pulse 73   Temp 97.9 F (36.6 C) (Oral)   Ht 5' 5.5" (1.664 m)   Wt (!) 302 lb 8 oz (137.2 kg)   LMP 11/07/2012   SpO2 94%   BMI 49.57 kg/m   Wt Readings from Last 3 Encounters:  02/11/23 (!) 302 lb 8 oz (137.2 kg)  08/30/22 (!) 308 lb (139.7 kg)  07/24/22 (!) 316 lb (143.3 kg)    Physical Exam Vitals and nursing note reviewed.  Constitutional:      General: She is not in acute distress.    Appearance: Normal appearance. She is not ill-appearing, toxic-appearing or diaphoretic.  HENT:     Head: Normocephalic.     Right Ear: External ear normal.     Left Ear: External ear normal.     Nose: Nose normal.     Mouth/Throat:     Mouth: Mucous membranes are moist.     Pharynx: Oropharynx is clear.  Eyes:     General:        Right eye: No discharge.        Left eye: No discharge.     Extraocular Movements: Extraocular movements intact.     Conjunctiva/sclera:  Conjunctivae normal.     Pupils: Pupils are equal, round, and reactive to light.  Cardiovascular:     Rate and Rhythm: Normal rate and regular rhythm.     Heart sounds: No murmur heard. Pulmonary:     Effort: Pulmonary effort is normal. No respiratory distress.     Breath sounds: Normal breath sounds. No wheezing or rales.  Musculoskeletal:     Cervical back: Normal range of motion and neck supple.  Skin:    General: Skin is warm and dry.     Capillary Refill: Capillary refill takes less than 2 seconds.  Neurological:     General: No focal deficit present.     Mental Status: She is alert and oriented to person, place, and time. Mental status is at baseline.  Psychiatric:        Mood and Affect: Mood normal.        Behavior: Behavior normal.        Thought Content: Thought content normal.        Judgment: Judgment normal.     Results for orders placed or performed during the hospital encounter of 06/28/22  Surgical pathology  Result Value Ref Range   SURGICAL PATHOLOGY       SURGICAL PATHOLOGY CASE: [id number] PATIENT: [name] Surgical Pathology Report     Clinical History: RLQ abd pain, abnormal CT stomach, duodenum jejunum     FINAL MICROSCOPIC DIAGNOSIS:  A. DUODENUM, BIOPSY: - Duodenal mucosa with normal villous architecture. - No villous atrophy or increased intraepithelial lymphocytes.    GROSS DESCRIPTION:  Received in formalin are tan, soft tissue fragments that are submitted in toto. Number: 4 size: Range from 0.2 to 0.3 cm blocks: 1 Lovey Newcomer 06/28/2022)    Final Diagnosis performed by Jimmy Picket, MD.   Electronically signed 06/29/2022 Technical component performed at [facility], [address] [place], [place] [address].  Professional component performed at [employer], [address] [place], [place] [address].  Immunohistochemistry Technical component (if applicable) was performed at [facility]. [address], [address], [place], [place] [address].   IMMUNOHISTOCHEMISTRY DISCLAIMER (if applicable): Some of these immunohistochemical stains may have been developed and the performance characteristics determine by [facility]. Some may not have been cleared or approved by the U.S. Food and Drug Administration. The FDA has determined that such clearance or approval is not necessary. This test is used for clinical purposes. It should not be regarded as investigational or for research. This laboratory is certified under the Clinical Laboratory Improvement Amendments of 1988 (CLIA-88) as qualified to perform high complexity clinical laboratory testing.  The controls stained appropriately.       Assessment & Plan:   Problem List Items Addressed This Visit       Other   Morbid obesity with BMI of 50.0-59.9, adult (Chronic)    Recommended eating smaller high protein, low fat meals more frequently and exercising 30 mins a day 5 times a week with a goal  of 10-15lb weight loss in the next 3 months.  Working with provider at [facility] for weight loss. Has lost 6lbs since last visit.       Relevant Medications   phentermine (ADIPEX-P) 37.5 MG tablet   Mixed hyperlipidemia (Chronic)    Chronic.  Controlled.  Continue with current medication regimen of Atorvastatin daily.  Labs ordered today.  Return to clinic in 6 months for reevaluation.  Call sooner if concerns  arise.        Relevant Medications   aspirin EC 81 MG tablet   atorvastatin (LIPITOR) 40 MG tablet   Other Relevant Orders   Lipid panel   Major depression, recurrent, chronic    Chronic. Ongoing.  Working with provider at [facility].  On Lamictal  daily.  Labs ordered today.  Follow up in 6 months.  Call sooner if concerns arise.       Relevant Medications   traZODone (DESYREL) 100 MG tablet   Iron deficiency anemia    Labs ordered at visit today.  Will make recommendations based on lab results.        Relevant Orders   CBC with Differential/Platelet   Vitamin D deficiency - Primary    Labs ordered at visit today.  Will make recommendations based on lab results.        Prediabetes    Labs ordered at visit today.  Will make recommendations based on lab results.        Relevant Orders   Comprehensive metabolic panel   HgB A1c   Chronic pain of right knee    Chronic.  Controlled.  Continue with current medication regimen of Gabapentin.  Labs ordered today.  Return to clinic in 6 months for reevaluation.  Call sooner if concerns arise.        Relevant Medications   gabapentin (NEURONTIN) 300 MG capsule   Other Visit Diagnoses     Other insomnia       Relevant Medications   traZODone (DESYREL) 100 MG tablet        Follow up plan: Return in about 6 months (around 08/13/2023) for HTN, HLD, DM2 FU.

## 2023-02-11 NOTE — Assessment & Plan Note (Signed)
Chronic.  Controlled.  Continue with current medication regimen of Atorvastatin daily.  Labs ordered today.  Return to clinic in 6 months for reevaluation.  Call sooner if concerns arise.   

## 2023-02-11 NOTE — Assessment & Plan Note (Signed)
Chronic.  Controlled.  Continue with current medication regimen of Gabapentin.  Labs ordered today.  Return to clinic in 6 months for reevaluation.  Call sooner if concerns arise.

## 2023-02-11 NOTE — Assessment & Plan Note (Signed)
Chronic. Ongoing.  Working with provider at [facility].  On Lamictal 150mg  daily.  Labs ordered today.  Follow up in 6 months.  Call sooner if concerns arise.

## 2023-02-11 NOTE — Progress Notes (Deleted)
LMP 11/07/2012    Subjective:    Patient ID: [name], female    DOB: Oct 23, 1976, 47 y.o.   MRN: [id number]  HPI: [name] is a 47 y.o. female presenting on 02/11/2023 for comprehensive medical examination. Current medical complaints include:{Blank single:19197::"none","***"}  She currently lives with: Menopausal Symptoms: {Blank single:19197::"yes","no"}  ANEMIA Anemia status: {Blank single:19197::"controlled","uncontrolled","better","worse","exacerbated","stable"} Etiology of anemia: Duration of anemia treatment:  Compliance with treatment: {Blank single:19197::"excellent compliance","good compliance","fair compliance","poor compliance"} Iron supplementation side effects: {Blank single:19197::"yes","no"} Severity of anemia: {Blank single:19197::"mild","moderate","severe"} Fatigue: {Blank single:19197::"yes","no"} Decreased exercise tolerance: {Blank single:19197::"yes","no"}  Dyspnea on exertion: {Blank single:19197::"yes","no"} Palpitations: {Blank single:19197::"yes","no"} Bleeding: {Blank single:19197::"yes","no"} Pica: {Blank single:19197::"yes","no"}  MOOD    Depression Screen done today and results listed below:     01/11/2023   10:04 AM 09/03/2022    1:08 PM 08/30/2022    9:09 AM 07/24/2022   11:05 AM 07/09/2022   11:26 AM  Depression screen PHQ 2/9  Decreased Interest 1  1 1 1   Down, Depressed, Hopeless 1  0 1 1  PHQ - 2 Score 2  1 2 2   Altered sleeping 2  2 2 3   Tired, decreased energy 3  2 2 2   Change in appetite 3  3 3 3   Feeling bad or failure about yourself  1  1 1 1   Trouble concentrating 0  1 1 1   Moving slowly or fidgety/restless 0  0 0 0  Suicidal thoughts 0  0 0 0  PHQ-9 Score 11  10 11 12   Difficult doing work/chores Not difficult at all  Somewhat difficult Somewhat difficult Somewhat difficult     Information is confidential and restricted. Go to Review Flowsheets to unlock data.    The patient {has/does not have:19849} a  history of falls. I {did/did not:19850} complete a risk assessment for falls. A plan of care for falls {was/was not:19852} documented.   Past Medical History:  Past Medical History:  Diagnosis Date   ADD (attention deficit disorder)    Allergies    Anemia    Arthritis    Asthma    EXERCISE INDUCED   B12 deficiency    Bilateral ovarian cysts 11/13/2012   Bursitis of hip    Chronic back pain    Depression    Family history of adverse reaction to anesthesia    patient states father had a reaction to anesthesia about 20 years ago where his face and throat swelled, had to be reintubated."   Family history of celiac disease 07/28/2018   Family history of rheumatoid arthritis 08/27/2018   Maternal uncle   Fibromyalgia    Flat foot 08/27/2018   Status post bilateral foot reconstruction surgery and Achilles tendon lengthening in childhood.   Generalized headaches    GERD (gastroesophageal reflux disease)    H/O laparoscopic adjustable gastric banding 07/29/2013   10/17/10    History of removal of laparoscopic gastric banding device 02/03/2018   Irregular menses    Joint pain    Lactose intolerance    Lower extremity edema    Migraines    Muscle pain    Obesity (BMI 30-39.9)    Osteoarthritis    Other allergic rhinitis 05/24/2020   Ovarian retention cyst 11/13/2012   Sleep apnea    SOB (shortness of breath)    Stomach ulcer    Swallowing difficulty    Vision problems    Vitamin D deficiency     Surgical History:  Past Surgical History:  Procedure  Laterality Date   ACHILLES TENDON LENGTHENING Bilateral 1989   ANKLE GANGLION CYST EXCISION  02/2012   left ankle   ANTERIOR LAT LUMBAR FUSION Right 05/11/2019   Procedure: ANTERIOR LATERAL LUMBAR INTERBODY FUSION, LATERAL INSTRUMENTATION, RIGHT LUMBAR TWO- LUMBAR THREE;  Surgeon: Tressie Stalker, MD;  Location: [facility] OR;  Service: Neurosurgery;  Laterality: Right;  ANTERIOR LATERAL LUMBAR INTERBODY FUSION, LATERAL INSTRUMENTATION, RIGHT  LUMBAR TWO- LUMBAR THREE   BIOPSY  03/16/2019   Procedure: BIOPSY;  Surgeon: Corbin Ade, MD;  Location: [facility];  Service: Endoscopy;;  Gastric    BIOPSY  06/28/2022   Procedure: BIOPSY;  Surgeon: Corbin Ade, MD;  Location: [facility];  Service: Endoscopy;;   COLONOSCOPY WITH PROPOFOL N/A 03/16/2019   Dr. Jena Gauss: Normal terminal ileum, 15 cm.  Normal colon.  Next colonoscopy 10 years   COLONOSCOPY WITH PROPOFOL N/A 06/28/2022   Procedure: COLONOSCOPY WITH PROPOFOL;  Surgeon: Corbin Ade, MD;  Location: [facility];  Service: Endoscopy;  Laterality: N/A;  W/ ILEOSCOPY, 11:00am, pt knows to arrive at 8:15   DILATION AND CURETTAGE OF UTERUS  2003   ENTEROSCOPY N/A 04/02/2019   Dr. Jena Gauss: bulbar erosions, jejunal ulcerations, attempted biopsy but scope fell backwards and lesions cannot be reidentified.  No evidence of ampullary lesion or mass.   ESOPHAGOGASTRODUODENOSCOPY (EGD) WITH PROPOFOL N/A 03/16/2019   Dr. Jena Gauss: Large hiatal hernia, Cameron/antral erosions, duodenal erosions.  Gastric biopsy showed reactive gastropathy, mild chronic gastritis, negative for H. pylori   ESOPHAGOGASTRODUODENOSCOPY (EGD) WITH PROPOFOL N/A 06/28/2022   Procedure: ESOPHAGOGASTRODUODENOSCOPY (EGD) WITH PROPOFOL;  Surgeon: Corbin Ade, MD;  Location: [facility];  Service: Endoscopy;  Laterality: N/A;   FEET SURGERY Bilateral 1990   INSERTION OF BONE GRAFT IN FEET   GIVENS CAPSULE STUDY N/A 03/16/2019   Procedure: GIVENS CAPSULE STUDY;  Surgeon: Corbin Ade, MD;  Location: [facility];  Service: Endoscopy;  Laterality: N/A;   LAPAROSCOPIC GASTRIC BANDING  10/17/2010   Dr Gaynelle Adu; [facility]   LAPAROSCOPIC REPAIR AND REMOVAL OF GASTRIC BAND  01/2018   LIPOMA EXCISION  02/2006   abdomen   LUMBAR LAMINECTOMY     SPINE SURGERY N/A    [employer]rees[employer] 11/10/2020   TOTAL ABDOMINAL HYSTERECTOMY     TRANSTHORACIC ECHOCARDIOGRAM  11/2021   Normal LV size and function.  EF 60 to 65%.   Mild LVH.  GR 1 DD.  No MR.  Normal LA, RA size.  Normal RV size and function.  Unable to assess RVP.  Normal estimated RAP.  Normal valves.    Medications:  Current Outpatient Medications on File Prior to Visit  Medication Sig   albuterol (VENTOLIN HFA) 108 (90 Base) MCG/ACT inhaler Inhale 2 puffs into the lungs every 6 (six) hours as needed for wheezing.   aspirin EC 81 MG tablet Take 1 tablet (81 mg total) by mouth daily. Swallow whole. (Patient taking differently: Take 81 mg by mouth every other day. Swallow whole.)   atorvastatin (LIPITOR) 40 MG tablet Take 1 tablet (40 mg total) by mouth daily.   butalbital-acetaminophen-caffeine (FIORICET) 50-325-40 MG tablet Take 1 tablet by mouth every 6 (six) hours as needed for headache.   celecoxib (CELEBREX) 100 MG capsule Take 1 capsule (100 mg total) by mouth 2 (two) times daily as needed for moderate pain.   EPINEPHrine 0.3 mg/0.3 mL IJ SOAJ injection Inject 0.3 mg into the muscle as needed for anaphylaxis.   gabapentin (NEURONTIN) 300 MG  capsule Take 1 capsule (300 mg total) by mouth 3 (three) times daily.   hydrochlorothiazide (HYDRODIURIL) 25 MG tablet Take 1 tablet (25 mg total) by mouth daily.   lamoTRIgine (LAMICTAL) 150 MG tablet Take 150 mg by mouth daily.   loratadine (CLARITIN) 10 MG tablet Take 10 mg by mouth daily.   OVER THE COUNTER MEDICATION Take 100 mg by mouth 2 (two) times daily as needed (pain). CBD   Probiotic Product (PROBIOTIC BLEND PO) Take 1 capsule by mouth daily.   traZODone (DESYREL) 100 MG tablet Take 1 tablet (100 mg total) by mouth at bedtime.   No current facility-administered medications on file prior to visit.    Allergies:  Allergies  Allergen Reactions   Cashew Nut Oil Anaphylaxis   Cauliflower [Brassica Oleracea] Anaphylaxis   Venlafaxine Other (See Comments)    Intense suicidal thought   Whey Anaphylaxis   Augmentin [Amoxicillin-Pot Clavulanate] Diarrhea and Nausea And Vomiting    Has patient had a  PCN reaction causing immediate rash, facial/tongue/throat swelling, SOB or lightheadedness with hypotension: No Has patient had a PCN reaction causing severe rash involving mucus membranes or skin necrosis: No Has patient had a PCN reaction that required hospitalization: No Has patient had a PCN reaction occurring within the last 10 years: Unknown If all of the above answers are "NO", then may proceed with Cephalosporin use.    Beeswax Itching and Swelling   Honey Itching and Swelling   Orange Fruit [Citrus] Other (See Comments)    ALL CITRUS FRUITS Migraines    Orange Oil Other (See Comments)   Wheat Diarrhea   Bee Pollen Swelling   Gluten Meal Other (See Comments)    Severe GI upset   Milk-Related Compounds    Other     epidural steroid injection - severe headaches, high fever   Codeine Rash   Tree Extract Itching    Tee tree oil    Social History:  Social History   Socioeconomic History   Marital status: Married    Spouse name: [name]   Number of children: 3   Years of education: Not on file   Highest education level: Not on file  Occupational History   Occupation: school counselor  Tobacco Use   Smoking status: Never   Smokeless tobacco: Never  Vaping Use   Vaping Use: Never used  Substance and Sexual Activity   Alcohol use: No   Drug use: No   Sexual activity: Yes  Other Topics Concern   Not on file  Social History Narrative   Lives with husband married 18 years Dec 2021      Oldest is 36- son lives in [place] daughter and 50 son year olds in home      Cat: Snickers       Enjoy: cooking some, puzzles, reading       Diet: eats all food groups outside allergies    Caffeine: 2 cans of diet soda daily and some tea   Water: 3 24 oz containers daily       Wears seat belt    Does not use phone while driving    Smoke Arts administrator -none          Social Determinants of Health   Financial Resource Strain: Not on file   Food Insecurity: Not on file  Transportation Needs: Not on file  Physical Activity: Not on file  Stress: Not on file  Social Connections: Not on file  Intimate Partner Violence: Not on file   Social History   Tobacco Use  Smoking Status Never  Smokeless Tobacco Never   Social History   Substance and Sexual Activity  Alcohol Use No    Family History:  Family History  Problem Relation Age of Onset   Hypertension Mother    Hyperlipidemia Mother    Diabetes Mother        pre-diabetes   Allergic rhinitis Mother    Asthma Mother    Urticaria Mother    Hypertension Father    Hyperlipidemia Father    Heart disease Father    Diabetes Father    Cancer Father        skin   Allergic rhinitis Father    Asthma Father    Urticaria Father    Diabetes Sister    Hypertension Sister    Allergic rhinitis Sister    Asthma Sister    Urticaria Sister    Pleurisy Son    Migraines Son    Heart disease Maternal Grandmother        V. Fib arrest   Cancer Maternal Grandfather        lung and skin   Colon cancer Maternal Grandfather    Cancer Paternal Grandfather        small bowel cancer and skin   Colon polyps Other        aunt and uncle   Eczema Neg Hx     Past medical history, surgical history, medications, allergies, family history and social history reviewed with patient today and changes made to appropriate areas of the chart.   ROS All other ROS negative except what is listed above and in the HPI.      Objective:    LMP 11/07/2012   Wt Readings from Last 3 Encounters:  08/30/22 (!) 308 lb (139.7 kg)  07/24/22 (!) 316 lb (143.3 kg)  07/09/22 (!) 314 lb (142.4 kg)    Physical Exam  Results for orders placed or performed during the hospital encounter of 06/28/22  Surgical pathology  Result Value Ref Range   SURGICAL PATHOLOGY      SURGICAL PATHOLOGY CASE: [id number] PATIENT: Kaydon Buys Surgical Pathology Report     Clinical History: RLQ abd pain,  abnormal CT stomach, duodenum jejunum     FINAL MICROSCOPIC DIAGNOSIS:  A. DUODENUM, BIOPSY: - Duodenal mucosa with normal villous architecture. - No villous atrophy or increased intraepithelial lymphocytes.    GROSS DESCRIPTION:  Received in formalin are tan, soft tissue fragments that are submitted in toto. Number: 4 size: Range from 0.2 to 0.3 cm blocks: 1 Lovey Newcomer 06/28/2022)    Final Diagnosis performed by Jimmy Picket, MD.   Electronically signed 06/29/2022 Technical component performed at [facility], [address] [place], [place] [address].  Professional component performed at [employer]. [facility], [address] [place], [place] [address].  Immunohistochemistry Technical component (if applicable) was performed at [facility]. [address], [place], [place] [address].   IMMUNOHISTOCHEMISTRY DISCLAIMER (if applicable): Some of these immunohistochemical stains may have been developed and the performance characteristics determine by [facility]. Some may not have been cleared or approved by the U.S. Food and Drug Administration. The FDA has determined that such clearance or approval is not necessary. This test is used for clinical purposes. It should not be regarded as investigational or for research. This laboratory is certified under the  Clinical Laboratory Improvement Amendments of 1988 (CLIA-88) as qualified to perform high complexity clinical laboratory testing.  The controls stained appropriately.       Assessment & Plan:   Problem List Items Addressed This Visit       Other   Mixed hyperlipidemia (Chronic)   Major depression, recurrent, chronic   Iron deficiency anemia   Vitamin D deficiency - Primary   Prediabetes     Follow up plan: No follow-ups on file.   LABORATORY TESTING:  - Pap smear: {Blank single:19197::"pap done","not applicable","up to date","done  elsewhere"}  IMMUNIZATIONS:   - Tdap: Tetanus vaccination status reviewed: {tetanus status:315746}. - Influenza: {Blank single:19197::"Up to date","Administered today","Postponed to flu season","Refused","Given elsewhere"} - Pneumovax: {Blank single:19197::"Up to date","Administered today","Not applicable","Refused","Given elsewhere"} - Prevnar: {Blank single:19197::"Up to date","Administered today","Not applicable","Refused","Given elsewhere"} - COVID: {Blank single:19197::"Up to date","Administered today","Not applicable","Refused","Given elsewhere"} - HPV: {Blank single:19197::"Up to date","Administered today","Not applicable","Refused","Given elsewhere"} - Shingrix vaccine: {Blank single:19197::"Up to date","Administered today","Not applicable","Refused","Given elsewhere"}  SCREENING: -Mammogram: {Blank single:19197::"Up to date","Ordered today","Not applicable","Refused","Done elsewhere"}  - Colonoscopy: {Blank single:19197::"Up to date","Ordered today","Not applicable","Refused","Done elsewhere"}  - Bone Density: {Blank single:19197::"Up to date","Ordered today","Not applicable","Refused","Done elsewhere"}  -Hearing Test: {Blank single:19197::"Up to date","Ordered today","Not applicable","Refused","Done elsewhere"}  -Spirometry: {Blank single:19197::"Up to date","Ordered today","Not applicable","Refused","Done elsewhere"}   PATIENT COUNSELING:   Advised to take 1 mg of folate supplement per day if capable of pregnancy.   Sexuality: Discussed sexually transmitted diseases, partner selection, use of condoms, avoidance of unintended pregnancy  and contraceptive alternatives.   Advised to avoid cigarette smoking.  I discussed with the patient that most people either abstain from alcohol or drink within safe limits (<=14/week and <=4 drinks/occasion for males, <=7/weeks and <= 3 drinks/occasion for females) and that the risk for alcohol disorders and other health effects rises  proportionally with the number of drinks per week and how often a drinker exceeds daily limits.  Discussed cessation/primary prevention of drug use and availability of treatment for abuse.   Diet: Encouraged to adjust caloric intake to maintain  or achieve ideal body weight, to reduce intake of dietary saturated fat and total fat, to limit sodium intake by avoiding high sodium foods and not adding table salt, and to maintain adequate dietary potassium and calcium preferably from fresh fruits, vegetables, and low-fat dairy products.    stressed the importance of regular exercise  Injury prevention: Discussed safety belts, safety helmets, smoke detector, smoking near bedding or upholstery.   Dental health: Discussed importance of regular tooth brushing, flossing, and dental visits.    NEXT PREVENTATIVE PHYSICAL DUE IN 1 YEAR. No follow-ups on file.

## 2023-02-12 LAB — CBC WITH DIFFERENTIAL/PLATELET
Basophils Absolute: 0 10*3/uL (ref 0.0–0.2)
Basos: 1 %
EOS (ABSOLUTE): 0.3 10*3/uL (ref 0.0–0.4)
Eos: 5 %
Hematocrit: 42.6 % (ref 34.0–46.6)
Hemoglobin: 14 g/dL (ref 11.1–15.9)
Immature Grans (Abs): 0 10*3/uL (ref 0.0–0.1)
Immature Granulocytes: 0 %
Lymphocytes Absolute: 1.8 10*3/uL (ref 0.7–3.1)
Lymphs: 29 %
MCH: 29.6 pg (ref 26.6–33.0)
MCHC: 32.9 g/dL (ref 31.5–35.7)
MCV: 90 fL (ref 79–97)
Monocytes Absolute: 0.4 10*3/uL (ref 0.1–0.9)
Monocytes: 6 %
Neutrophils Absolute: 3.7 10*3/uL (ref 1.4–7.0)
Neutrophils: 59 %
Platelets: 253 10*3/uL (ref 150–450)
RBC: 4.73 x10E6/uL (ref 3.77–5.28)
RDW: 14.2 % (ref 11.7–15.4)
WBC: 6.2 10*3/uL (ref 3.4–10.8)

## 2023-02-12 LAB — COMPREHENSIVE METABOLIC PANEL
ALT: 41 IU/L — ABNORMAL HIGH (ref 0–32)
AST: 28 IU/L (ref 0–40)
Albumin/Globulin Ratio: 1.7 (ref 1.2–2.2)
Albumin: 4.6 g/dL (ref 3.9–4.9)
Alkaline Phosphatase: 131 IU/L — ABNORMAL HIGH (ref 44–121)
BUN/Creatinine Ratio: 8 — ABNORMAL LOW (ref 9–23)
BUN: 8 mg/dL (ref 6–24)
Bilirubin Total: 0.5 mg/dL (ref 0.0–1.2)
CO2: 26 mmol/L (ref 20–29)
Calcium: 9.5 mg/dL (ref 8.7–10.2)
Chloride: 99 mmol/L (ref 96–106)
Creatinine, Ser: 0.95 mg/dL (ref 0.57–1.00)
Globulin, Total: 2.7 g/dL (ref 1.5–4.5)
Glucose: 92 mg/dL (ref 70–99)
Potassium: 3.9 mmol/L (ref 3.5–5.2)
Sodium: 141 mmol/L (ref 134–144)
Total Protein: 7.3 g/dL (ref 6.0–8.5)
eGFR: 74 mL/min/{1.73_m2} (ref >59)

## 2023-02-12 LAB — LIPID PANEL
Chol/HDL Ratio: 3 ratio (ref 0.0–4.4)
Cholesterol, Total: 122 mg/dL (ref 100–199)
HDL: 41 mg/dL (ref >39)
LDL Chol Calc (NIH): 58 mg/dL (ref 0–99)
Triglycerides: 131 mg/dL (ref 0–149)
VLDL Cholesterol Cal: 23 mg/dL (ref 5–40)

## 2023-02-12 LAB — HEMOGLOBIN A1C
Est. average glucose Bld gHb Est-mCnc: 134 mg/dL
Hgb A1c MFr Bld: 6.3 % — ABNORMAL HIGH (ref 4.8–5.6)

## 2023-02-12 LAB — HM MAMMOGRAPHY

## 2023-02-12 NOTE — Progress Notes (Signed)
Hi Ms. [name]. It was nice to see you yesterday.  Your lab work looks good.  Your liver enzymes are slightly elevated.  We will monitor these in the future.  Your A1c is in the prediabetic range at 6.3.  I suspect this will improve with your weight loss journey.  No concerns at this time. Continue with your current medication regimen.  Follow up as discussed.  Please let me know if you have any questions.

## 2023-02-13 LAB — HM PAP SMEAR

## 2023-02-21 ENCOUNTER — Encounter: Payer: Self-pay | Admitting: Nurse Practitioner

## 2023-04-29 ENCOUNTER — Ambulatory Visit (INDEPENDENT_AMBULATORY_CARE_PROVIDER_SITE_OTHER): Payer: BC Managed Care – PPO | Admitting: Internal Medicine

## 2023-04-29 VITALS — BP 142/102 | HR 83 | Resp 16 | Ht 64.5 in | Wt 300.0 lb

## 2023-04-29 DIAGNOSIS — N951 Menopausal and female climacteric states: Secondary | ICD-10-CM

## 2023-04-29 DIAGNOSIS — G4733 Obstructive sleep apnea (adult) (pediatric): Secondary | ICD-10-CM

## 2023-04-29 DIAGNOSIS — Z7189 Other specified counseling: Secondary | ICD-10-CM

## 2023-04-29 DIAGNOSIS — Z6841 Body Mass Index (BMI) 40.0 and over, adult: Secondary | ICD-10-CM

## 2023-04-29 DIAGNOSIS — I10 Essential (primary) hypertension: Secondary | ICD-10-CM | POA: Insufficient documentation

## 2023-04-29 DIAGNOSIS — I152 Hypertension secondary to endocrine disorders: Secondary | ICD-10-CM | POA: Insufficient documentation

## 2023-04-29 NOTE — Patient Instructions (Signed)

## 2023-04-29 NOTE — Progress Notes (Signed)
[facility] [address] [place], [place] [address]  Pulmonary Sleep Medicine   Office Visit Note  Patient Name: [name] DOB: 11-01-1975 MRN [id number]    Chief Complaint: Obstructive Sleep Apnea visit  Brief History:  Taetum is seen today for an annual follow up on APAP at 6-20 cmh20.  The patient has a 4 year history of sleep apnea. Patient is using PAP nightly.  The patient feels not rested after sleeping with PAP.  The patient reports benefiting from PAP use. Reported sleepiness is improved and the Epworth Sleepiness Score is 3 out of 24. The patient does not take naps. The patient complains of the following: Some issues with the mask shifting, I recommended a replacement mask.  She is having frequent awakenings and feeling tired during the day.  The compliance download shows 100% compliance with an average use time of 7 hours 30 minutes. The AHI is 1.3.  The patient does complain of limb movements disrupting sleep. She complains of frequent awakening and sleep disruption, associated with hot flashes/night sweats.   ROS  General: (-) fever, (-) chills, (-) night sweat Nose and Sinuses: (-) nasal stuffiness or itchiness, (-) postnasal drip, (-) nosebleeds, (-) sinus trouble. Mouth and Throat: (-) sore throat, (-) hoarseness. Neck: (-) swollen glands, (-) enlarged thyroid, (-) neck pain. Respiratory: - cough, - shortness of breath, - wheezing. Neurologic: - numbness, - tingling. Psychiatric: + anxiety, - depression   Current Medication: Outpatient Encounter Medications as of 04/29/2023  Medication Sig   albuterol (VENTOLIN HFA) 108 (90 Base) MCG/ACT inhaler Inhale 2 puffs into the lungs every 6 (six) hours as needed for wheezing.   aspirin EC 81 MG tablet Take 1 tablet by mouth every other day.   atorvastatin (LIPITOR) 40 MG tablet Take 1 tablet (40 mg total) by mouth daily.   butalbital-acetaminophen-caffeine (FIORICET) 50-325-40 MG tablet Take 1 tablet by  mouth every 6 (six) hours as needed for headache.   EPINEPHrine 0.3 mg/0.3 mL IJ SOAJ injection Inject 0.3 mg into the muscle as needed for anaphylaxis.   gabapentin (NEURONTIN) 300 MG capsule Take 1 capsule (300 mg total) by mouth 3 (three) times daily.   hydrochlorothiazide (HYDRODIURIL) 25 MG tablet Take 1 tablet (25 mg total) by mouth daily.   lamoTRIgine (LAMICTAL) 150 MG tablet Take 150 mg by mouth daily.   loratadine (CLARITIN) 10 MG tablet Take 10 mg by mouth daily.   OVER THE COUNTER MEDICATION Take 100 mg by mouth 2 (two) times daily as needed (pain). CBD   phentermine (ADIPEX-P) 37.5 MG tablet Take 37.5 mg by mouth every morning.   Probiotic Product (PROBIOTIC BLEND PO) Take 1 capsule by mouth daily.   QULIPTA 60 MG TABS Take 1 tablet by mouth daily.   traZODone (DESYREL) 100 MG tablet Take 1 tablet (100 mg total) by mouth at bedtime.   No facility-administered encounter medications on file as of 04/29/2023.    Surgical History: Past Surgical History:  Procedure Laterality Date   ACHILLES TENDON LENGTHENING Bilateral 1989   ANKLE GANGLION CYST EXCISION  02/2012   left ankle   ANTERIOR LAT LUMBAR FUSION Right 05/11/2019   Procedure: ANTERIOR LATERAL LUMBAR INTERBODY FUSION, LATERAL INSTRUMENTATION, RIGHT LUMBAR TWO- LUMBAR THREE;  Surgeon: Tressie Stalker, MD;  Location: [facility] OR;  Service: Neurosurgery;  Laterality: Right;  ANTERIOR LATERAL LUMBAR INTERBODY FUSION, LATERAL INSTRUMENTATION, RIGHT LUMBAR TWO- LUMBAR THREE   BIOPSY  03/16/2019   Procedure: BIOPSY;  Surgeon: Corbin Ade, MD;  [facility];  Service: Endoscopy;;  Gastric    BIOPSY  06/28/2022   Procedure: BIOPSY;  Surgeon: Corbin Ade, MD;  Location: [facility];  Service: Endoscopy;;   COLONOSCOPY WITH PROPOFOL N/A 03/16/2019   Dr. Jena Gauss: Normal terminal ileum, 15 cm.  Normal colon.  Next colonoscopy 10 years   COLONOSCOPY WITH PROPOFOL N/A 06/28/2022   Procedure: COLONOSCOPY WITH PROPOFOL;   Surgeon: Corbin Ade, MD;  Location: [facility];  Service: Endoscopy;  Laterality: N/A;  W/ ILEOSCOPY, 11:00am, pt knows to arrive at 8:15   DILATION AND CURETTAGE OF UTERUS  2003   ENTEROSCOPY N/A 04/02/2019   Dr. Jena Gauss: bulbar erosions, jejunal ulcerations, attempted biopsy but scope fell backwards and lesions cannot be reidentified.  No evidence of ampullary lesion or mass.   ESOPHAGOGASTRODUODENOSCOPY (EGD) WITH PROPOFOL N/A 03/16/2019   Dr. Jena Gauss: Large hiatal hernia, Cameron/antral erosions, duodenal erosions.  Gastric biopsy showed reactive gastropathy, mild chronic gastritis, negative for H. pylori   ESOPHAGOGASTRODUODENOSCOPY (EGD) WITH PROPOFOL N/A 06/28/2022   Procedure: ESOPHAGOGASTRODUODENOSCOPY (EGD) WITH PROPOFOL;  Surgeon: Corbin Ade, MD;  Location[facility];  Service: Endoscopy;  Laterality: N/A;   FEET SURGERY Bilateral 1990   INSERTION OF BONE GRAFT IN FEET   GIVENS CAPSULE STUDY N/A 03/16/2019   Procedure: GIVENS CAPSULE STUDY;  Surgeon: Corbin Ade, MD;  [facility]: [facility];  Service: Endoscopy;  Laterality: N/A;   LAPAROSCOPIC GASTRIC BANDING  10/17/2010   Dr Gaynelle Adu; [facility]   LAPAROSCOPIC REPAIR AND REMOVAL OF GASTRIC BAND  01/2018   LIPOMA EXCISION  02/2006   abdomen   LUMBAR LAMINECTOMY     SPINE SURGERY N/A    Phreesia 11/10/2020   TOTAL ABDOMINAL HYSTERECTOMY     TRANSTHORACIC ECHOCARDIOGRAM  11/2021   Normal LV size and function.  EF 60 to 65%.  Mild LVH.  GR 1 DD.  No MR.  Normal LA, RA size.  Normal RV size and function.  Unable to assess RVP.  Normal estimated RAP.  Normal valves.    Medical History: Past Medical History:  Diagnosis Date   ADD (attention deficit disorder)    Allergies    Anemia    Arthritis    Asthma    EXERCISE INDUCED   B12 deficiency    Bilateral ovarian cysts 11/13/2012   Bursitis of hip    Chronic back pain    Depression    Family history of adverse reaction to anesthesia    patient states  father had a reaction to anesthesia about 20 years ago where his face and throat swelled, had to be reintubated."   Family history of celiac disease 07/28/2018   Family history of rheumatoid arthritis 08/27/2018   Maternal uncle   Fibromyalgia    Flat foot 08/27/2018   Status post bilateral foot reconstruction surgery and Achilles tendon lengthening in childhood.   Generalized headaches    GERD (gastroesophageal reflux disease)    H/O laparoscopic adjustable gastric banding 07/29/2013   10/17/10    History of removal of laparoscopic gastric banding device 02/03/2018   Irregular menses    Joint pain    Lactose intolerance    Lower extremity edema    Migraines    Muscle pain    Obesity (BMI 30-39.9)    Osteoarthritis    Other allergic rhinitis 05/24/2020   Ovarian retention cyst 11/13/2012   Sleep apnea    SOB (shortness of breath)    Stomach  ulcer    Swallowing difficulty    Vision problems    Vitamin D deficiency     Family History: Non contributory to the present illness  Social History: Social History   Socioeconomic History   Marital status: Married    Spouse name: [name]   Number of children: 3   Years of education: Not on file   Highest education level: Not on file  Occupational History   Occupation: school counselor  Tobacco Use   Smoking status: Never   Smokeless tobacco: Never  Vaping Use   Vaping Use: Never used  Substance and Sexual Activity   Alcohol use: No   Drug use: No   Sexual activity: Yes  Other Topics Concern   Not on file  Social History Narrative   Lives with husband married 18 years Dec 2021      Oldest is 45- son lives in [place]   12 daughter and 65 son year olds in home      Cat: Snickers       Enjoy: cooking some, puzzles, reading       Diet: eats all food groups outside allergies    Caffeine: 2 cans of diet soda daily and some tea   Water: 3 24 oz containers daily       Wears seat belt    Does not use phone while driving     Smoke Arts administrator -none          Social Determinants of Health   Financial Resource Strain: Not on file  Food Insecurity: Not on file  Transportation Needs: Not on file  Physical Activity: Not on file  Stress: Not on file  Social Connections: Not on file  Intimate Partner Violence: Not on file    Vital Signs: Blood pressure (!) 142/102, pulse 83, resp. rate 16, height 5' 4.5" (1.638 m), weight 300 lb (136.1 kg), last menstrual period 11/07/2012, SpO2 96 %. Body mass index is 50.7 kg/m.    Examination: General Appearance: The patient is well-developed, well-nourished, and in no distress. Neck Circumference: 44 cm Skin: Gross inspection of skin unremarkable. Head: normocephalic, no gross deformities. Eyes: no gross deformities noted. ENT: ears appear grossly normal Neurologic: Alert and oriented. No involuntary movements.  STOP BANG RISK ASSESSMENT S (snore) Have you been told that you snore?     NO   T (tired) Are you often tired, fatigued, or sleepy during the day?   YES  O (obstruction) Do you stop breathing, choke, or gasp during sleep? NO   P (pressure) Do you have or are you being treated for high blood pressure? NO   B (BMI) Is your body index greater than 35 kg/m? YES   A (age) Are you 57 years old or older? NO   N (neck) Do you have a neck circumference greater than 16 inches?   YES   G (gender) Are you a female? NO   TOTAL STOP/BANG "YES" ANSWERS 3       A STOP-Bang score of 2 or less is considered low risk, and a score of 5 or more is high risk for having either moderate or severe OSA. For people who score 3 or 4, doctors may need to perform further assessment to determine how likely they are to have OSA.         EPWORTH SLEEPINESS SCALE:  Scale:  (0)= no chance of dozing; (1)= slight chance of dozing; (2)= moderate  chance of dozing; (3)= high chance of dozing  Chance  Situtation    Sitting and reading: 1    Watching  TV: 1    Sitting Inactive in public: 0    As a passenger in car: 0      Lying down to rest: 1    Sitting and talking: 0    Sitting quielty after lunch: 0    In a car, stopped in traffic: 0   TOTAL SCORE:   3 out of 24    SLEEP STUDIES:  PSG (06/2019) AHI 17/hr, RDI 29/hr, min SpO2 80%   CPAP COMPLIANCE DATA:  Date Range: 04/25/22 - 04/24/23  Average Daily Use: 7 hours 30 minutes  Median Use: 7 hours 19 minutes  Compliance for > 4 Hours: 364 days  AHI: 1.3 respiratory events per hour  Days Used: 364/365  Mask Leak: 8.4  95th Percentile Pressure: 12.9 cmh20         LABS: Recent Results (from the past 2160 hour(s))  CBC with Differential/Platelet     Status: None   Collection Time: 02/11/23 11:15 AM  Result Value Ref Range   WBC 6.2 3.4 - 10.8 x10E3/uL   RBC 4.73 3.77 - 5.28 x10E6/uL   Hemoglobin 14.0 11.1 - 15.9 g/dL   Hematocrit 82.9 56.2 - 46.6 %   MCV 90 79 - 97 fL   MCH 29.6 26.6 - 33.0 pg   MCHC 32.9 31.5 - 35.7 g/dL   RDW 13.0 86.5 - 78.4 %   Platelets 253 150 - 450 x10E3/uL   Neutrophils 59 Not Estab. %   Lymphs 29 Not Estab. %   Monocytes 6 Not Estab. %   Eos 5 Not Estab. %   Basos 1 Not Estab. %   Neutrophils Absolute 3.7 1.4 - 7.0 x10E3/uL   Lymphocytes Absolute 1.8 0.7 - 3.1 x10E3/uL   Monocytes Absolute 0.4 0.1 - 0.9 x10E3/uL   EOS (ABSOLUTE) 0.3 0.0 - 0.4 x10E3/uL   Basophils Absolute 0.0 0.0 - 0.2 x10E3/uL   Immature Granulocytes 0 Not Estab. %   Immature Grans (Abs) 0.0 0.0 - 0.1 x10E3/uL  Comprehensive metabolic panel     Status: Abnormal   Collection Time: 02/11/23 11:15 AM  Result Value Ref Range   Glucose 92 70 - 99 mg/dL   BUN 8 6 - 24 mg/dL   Creatinine, Ser 6.96 0.57 - 1.00 mg/dL   eGFR 74 >29 BM/WUX/3.24   BUN/Creatinine Ratio 8 (L) 9 - 23   Sodium 141 134 - 144 mmol/L   Potassium 3.9 3.5 - 5.2 mmol/L   Chloride 99 96 - 106 mmol/L   CO2 26 20 - 29 mmol/L   Calcium 9.5 8.7 - 10.2 mg/dL   Total Protein 7.3 6.0  - 8.5 g/dL   Albumin 4.6 3.9 - 4.9 g/dL   Globulin, Total 2.7 1.5 - 4.5 g/dL   Albumin/Globulin Ratio 1.7 1.2 - 2.2   Bilirubin Total 0.5 0.0 - 1.2 mg/dL   Alkaline Phosphatase 131 (H) 44 - 121 IU/L   AST 28 0 - 40 IU/L   ALT 41 (H) 0 - 32 IU/L  Lipid panel     Status: None   Collection Time: 02/11/23 11:15 AM  Result Value Ref Range   Cholesterol, Total 122 100 - 199 mg/dL   Triglycerides 401 0 - 149 mg/dL   HDL 41 >02 mg/dL   VLDL Cholesterol Cal 23 5 - 40 mg/dL   LDL Chol Calc (NIH) 58  0 - 99 mg/dL   Chol/HDL Ratio 3.0 0.0 - 4.4 ratio    Comment:                                   T. Chol/HDL Ratio                                             Men  Women                               1/2 Avg.Risk  3.4    3.3                                   Avg.Risk  5.0    4.4                                2X Avg.Risk  9.6    7.1                                3X Avg.Risk 23.4   11.0   HgB A1c     Status: Abnormal   Collection Time: 02/11/23 11:15 AM  Result Value Ref Range   Hgb A1c MFr Bld 6.3 (H) 4.8 - 5.6 %    Comment:          Prediabetes: 5.7 - 6.4          Diabetes: >6.4          Glycemic control for adults with diabetes: <7.0    Est. average glucose Bld gHb Est-mCnc 134 mg/dL  HM MAMMOGRAPHY     Status: None   Collection Time: 02/12/23 12:00 AM  Result Value Ref Range   HM Mammogram 0-4 Bi-Rad 0-4 Bi-Rad, Self Reported Normal  HM PAP SMEAR     Status: None   Collection Time: 02/13/23 12:00 AM  Result Value Ref Range   HM Pap smear see result scanned into chart     Radiology: No results found.  No results found.  No results found.    Assessment and Plan: Patient Active Problem List   Diagnosis Date Noted   CPAP use counseling 04/29/2023   Essential hypertension 04/29/2023   Hiatal hernia 05/11/2022   RLQ abdominal pain 05/11/2022   Deep vein thrombosis (DVT) of popliteal vein of right lower extremity (HCC) 01/11/2022   Hyponatremia 01/03/2022   Deep venous  thrombosis (HCC) 01/03/2022   Bilateral lower extremity edema 11/22/2021   Chronic pain of right knee 01/11/2021   Primary osteoarthritis of right knee 01/11/2021   Vitamin D deficiency 10/06/2020   Prediabetes 10/06/2020   Anxiety, with emotional eating 10/06/2020   Mixed hyperlipidemia 10/06/2020   Situational anxiety 09/08/2020   Migraine without aura and without status migrainosus, not intractable 09/08/2020   Mild intermittent asthma without complication 05/24/2020   Migraine 10/20/2019   Asthma 10/20/2019   Arthritis 10/20/2019   OSA on CPAP 07/09/2019   Degenerative scoliosis in adult patient 05/11/2019   Anxiety and depression 04/08/2019   Fibromyalgia 04/08/2019   Primary osteoarthritis of  both knees 09/17/2018   Primary osteoarthritis of both hands 09/17/2018   DDD (degenerative disc disease), lumbar 08/27/2018   Multiple food allergies 07/28/2018   Iron deficiency anemia 02/07/2018   Prolapsed lumbar disc 12/27/2017   Major depression, recurrent, chronic (HCC) 01/22/2011   Morbid obesity with BMI of 50.0-59.9, adult (HCC) 02/13/2010   1. OSA on CPAP The patient does tolerate PAP and reports  benefit from PAP use. The patient was reminded how to clean equipment and advised to replace supplies routinely. The patient was also counselled on weight loss. The compliance is excellent. The AHI is 1.3.   OSA on cpap- controlled. Continue with excellent compliance with pap. CPAP continues to be medically necessary to treat this patient's OSA. F/u one year.     2. CPAP use counseling CPAP Counseling: had a lengthy discussion with the patient regarding the importance of PAP therapy in management of the sleep apnea. Patient appears to understand the risk factor reduction and also understands the risks associated with untreated sleep apnea. Patient will try to make a good faith effort to remain compliant with therapy. Also instructed the patient on proper cleaning of the device  including the water must be changed daily if possible and use of distilled water is preferred. Patient understands that the machine should be regularly cleaned with appropriate recommended cleaning solutions that do not damage the PAP machine for example given white vinegar and water rinses. Other methods such as ozone treatment may not be as good as these simple methods to achieve cleaning.   3. Morbid obesity with BMI of 50.0-59.9, adult (HCC) Obesity Counseling: Had a lengthy discussion regarding patients BMI and weight issues. Patient was instructed on portion control as well as increased activity. Also discussed caloric restrictions with trying to maintain intake less than 2000 Kcal. Discussions were made in accordance with the 5As of weight management. Simple actions such as not eating late and if able to, taking a walk is suggested.   4. Menopausal vasomotor syndrome This appears to be cause of her sleep disruption. She will discuss treatment options with her provider.     General Counseling: I have discussed the findings of the evaluation and examination with [facility].  I have also discussed any further diagnostic evaluation thatmay be needed or ordered today. Demetrice verbalizes understanding of the findings of todays visit. We also reviewed her medications today and discussed drug interactions and side effects including but not limited excessive drowsiness and altered mental states. We also discussed that there is always a risk not just to her but also people around her. she has been encouraged to call the office with any questions or concerns that should arise related to todays visit.  No orders of the defined types were placed in this encounter.       I have personally obtained a history, examined the patient, evaluated laboratory and imaging results, formulated the assessment and plan and placed orders. This patient was seen today by Emmaline Kluver, PA-C in collaboration with [name].   Yevonne Pax, MD [facility] Diplomate ABMS Pulmonary Critical Care Medicine and Sleep Medicine

## 2023-08-08 ENCOUNTER — Ambulatory Visit: Payer: BC Managed Care – PPO | Admitting: Cardiology

## 2023-08-09 ENCOUNTER — Other Ambulatory Visit: Payer: Self-pay | Admitting: Nurse Practitioner

## 2023-08-09 NOTE — Progress Notes (Signed)
Cardiology Clinic Note   Date: 08/12/2023 ID: Blossom, Crume [birth year], MRN [id number]  Primary Cardiologist:  Bryan Lemma, MD  Patient Profile    [name] is a 47 y.o. female who presents to the clinic today for routine follow up.     Past medical history significant for: Dyspnea.  Echo 11/30/2021: EF 60-65%. No RWMA. Mild LVH. Grade I DD. Normal RV function. No significant valvular abnormalities.  DVT.  Vascular US lower extremities 01/03/2022: Findings consistent with acute DVT right popliteal vein, no cystic structure in popliteal fossa, all other veins appear fully compressible and demonstrate appropriate Doppler characteristics. No evidence of left common femoral vein obstruction, no cystic structure found in the popliteal fossa.  Vascular US lower extremities 04/27/2022: No evidence of DVT in bilateral lower extremities from common femoral through popliteal veins. No evidence of superficial venous thrombosis in the lower extremities bilaterally. No evidence of deep venous insufficiency bilaterally. Prior right popliteal DVT has resolved. Venous reflux is noted in the left greater saphenous vein in the thigh.  Hypertension.  Hyperlipidemia.  Lipid panel 02/11/2023: LDL 58, HDL 41, TG 131, total 122.  Migraines.  OSA.  On CPAP 100% compliance.  Asthma.  Fibromyalgia.      History of Present Illness    [name] was first evaluated by Dr. Herbie Baltimore on 11/23/2021 for bilateral lower extremity edema and pre-op clearance prior to back surgery. She reported taking hydrochlorothiazide for hypertension and to manage edema. She had taken Lasix in the past but did not like taking it. She reported mild DOE which she attributed to back pain and deconditioning. Echo showed normal LV/RV function with grade I DD. Patient was evaluated by Dr. Tresa Endo as a DOD work-in 3 weeks after back surgery for progressive lower extremity edema not responding to hydrochlorothiazide. She reported  baseline dyspnea unchanged. Lower extremity US showed acute DVT of right popliteal vein and she was started on Xarelto.   Patient was last seen in the office by Dr. Herbie Baltimore on 05/03/2022 for routine follow up. Repeat lower extremity US in June 2023 showed resolution of DVT. She was instructed to transition to daily aspirin when course of Xarelto completed.   Discussed the use of AI scribe software for clinical note transcription with the patient, who gave verbal consent to proceed.  The patient presents for a routine check-up. No chest pain, pressure, or tightness. No palpitations.  She reports chronic swelling in the ankles and knees unchanged from previous. Ankles are usually normal in the morning and edema progresses throughout the day. She does have a history of arthritis in bilateral knees and is unsure if swelling can be attributed to that.  The patient also mentions having asthma and food allergies. She has not had an issue with dyspnea unless she has a URI or other respiratory illness. She reports occasional acid reflux, but it is infrequent and managed with over-the-counter medication. The patient also mentions experiencing back pain after standing for extended periods, which limits physical activity. She tries to work 1-2 times a day while at work. She used to do water aerobics but states time is a factor with working 45 minutes from her home and needing to get her daughter to school and extracurricular activities. She uses a CPAP machine for sleep apnea and reports 100% compliance.        ROS: All other systems reviewed and are otherwise negative except as noted in History of Present Illness.  Studies Reviewed  EKG Interpretation Date/Time:  Monday August 12 2023 09:02:14 EDT Ventricular Rate:  81 PR Interval:  140 QRS Duration:  92 QT Interval:  378 QTC Calculation: 439 R Axis:   -6  Text Interpretation: Normal sinus rhythm Normal ECG When compared with ECG of 09-Jan-2018 16:48,  No significant change was found Confirmed by Carlos Levering [phone]) on 08/12/2023 9:14:09 AM       Physical Exam    VS:  BP (!) 130/100   Pulse 81   Ht 5\' 4"  (1.626 m)   Wt (!) 305 lb (138.3 kg)   LMP 11/07/2012   SpO2 97%   BMI 52.35 kg/m  , BMI Body mass index is 52.35 kg/m.  GEN: Well nourished, well developed, in no acute distress. Neck: No JVD or carotid bruits. Cardiac:  RRR. No murmurs. No rubs or gallops.   Respiratory:  Respirations regular and unlabored. Clear to auscultation without rales, wheezing or rhonchi. GI: Soft, nontender, nondistended. Extremities: Radials/DP/PT 2+ and equal bilaterally. No clubbing or cyanosis. Mild nonpitting edema bilateral ankles.   Skin: Warm and dry, no rash. Neuro: Strength intact.  Assessment & Plan      Hypertension BP 130/100 on intake and 130/86 on my recheck. She reports anxiety related to BP checks. She does not check it at home. -Encouraged to check BP at home if possible. -Continue HCTZ.   Hyperlipidemia LDL April 2024 58, at goal. -Continue atorvastatin.   Asthma/Dyspnea Controlled with occasional use of inhaler during illness or exposure to allergens. Echo February 2023 showed normal LV/RV function with grade I DD.  -No changes in management.  Deep Vein Thrombosis (DVT) history No current symptoms of DVT. Last ultrasound in June 2023 showed no clots. Patient continues to experience ankle swelling. Typically normal in the mornings and progresses throughout the day. Mild, nonpitting edema bilateral ankles. No longer on anticoagulation.  -Continue aspirin.   OSA Patient is compliant with CPAP use.  -No changes in management.       Disposition: Return in 1 year or sooner as needed.          Signed, Etta Grandchild. Landin Tallon, DNP, NP-C

## 2023-08-12 ENCOUNTER — Ambulatory Visit: Payer: BC Managed Care – PPO | Attending: Cardiology | Admitting: Student

## 2023-08-12 ENCOUNTER — Encounter: Payer: Self-pay | Admitting: Student

## 2023-08-12 VITALS — BP 130/86 | HR 81 | Ht 64.0 in | Wt 305.0 lb

## 2023-08-12 DIAGNOSIS — E782 Mixed hyperlipidemia: Secondary | ICD-10-CM | POA: Diagnosis not present

## 2023-08-12 DIAGNOSIS — J4520 Mild intermittent asthma, uncomplicated: Secondary | ICD-10-CM

## 2023-08-12 DIAGNOSIS — R0600 Dyspnea, unspecified: Secondary | ICD-10-CM

## 2023-08-12 DIAGNOSIS — G4733 Obstructive sleep apnea (adult) (pediatric): Secondary | ICD-10-CM

## 2023-08-12 DIAGNOSIS — Z86718 Personal history of other venous thrombosis and embolism: Secondary | ICD-10-CM

## 2023-08-12 DIAGNOSIS — I10 Essential (primary) hypertension: Secondary | ICD-10-CM

## 2023-08-12 NOTE — Telephone Encounter (Signed)
Requested Prescriptions  Pending Prescriptions Disp Refills   atorvastatin (LIPITOR) 40 MG tablet [Pharmacy Med Name: ATORVASTATIN 40MG  TABLETS] 90 tablet 1    Sig: TAKE 1 TABLET(40 MG) BY MOUTH DAILY     Cardiovascular:  Antilipid - Statins Failed - 08/09/2023  7:04 PM      Failed - Lipid Panel in normal range within the last 12 months    Cholesterol, Total  Date Value Ref Range Status  02/11/2023 122 100 - 199 mg/dL Final   LDL Chol Calc (NIH)  Date Value Ref Range Status  02/11/2023 58 0 - 99 mg/dL Final   HDL  Date Value Ref Range Status  02/11/2023 41 >39 mg/dL Final   Triglycerides  Date Value Ref Range Status  02/11/2023 131 0 - 149 mg/dL Final         Passed - Patient is not pregnant      Passed - Valid encounter within last 12 months    Recent Outpatient Visits           6 months ago Vitamin D deficiency   [place] [facility] Larae Grooms, NP   7 months ago Morbid obesity with BMI of 50.0-59.9, adult [facility])   [place] [facility] Larae Grooms, NP   11 months ago Major depression, recurrent, chronic (HCC)   [place] [facility] Mecum, Oswaldo Conroy, PA-C   1 year ago Anxiety and depression   [place] [facility] Mecum, Oswaldo Conroy, PA-C   1 year ago Major depression, recurrent, chronic (HCC)   [place] [facility] Mecum, Oswaldo Conroy, PA-C       Future Appointments             Today W[place], Gavin Pound, NP [facility] [place]   In 2 days Larae Grooms, NP  [facility]

## 2023-08-12 NOTE — Patient Instructions (Signed)
Medication Instructions:  Your Physician recommend you continue on your current medication as directed.    *If you need a refill on your cardiac medications before your next appointment, please call your pharmacy*   Follow-Up: At [facility], you and your health needs are our priority.  As part of our continuing mission to provide you with exceptional heart care, we have created designated Provider Care Teams.  These Care Teams include your primary Cardiologist (physician) and Advanced Practice Providers (APPs -  Physician Assistants and Nurse Practitioners) who all work together to provide you with the care you need, when you need it.  We recommend signing up for the patient portal called "MyChart".  Sign up information is provided on this After Visit Summary.  MyChart is used to connect with patients for Virtual Visits (Telemedicine).  Patients are able to view lab/test results, encounter notes, upcoming appointments, etc.  Non-urgent messages can be sent to your provider as well.   To learn more about what you can do with MyChart, go to [email or web address].    Your next appointment:   1 year(s)  Provider:   You may see Bryan Lemma, MD or one of the following Advanced Practice Providers on your designated Care Team:   Carlos Levering, NP

## 2023-08-13 NOTE — Progress Notes (Unsigned)
LMP 11/07/2012    Subjective:    Patient ID: [name], female    DOB: 07-16-1976, 47 y.o.   MRN: [id number]  HPI: [name] is a 47 y.o. female presenting on 08/14/2023 for comprehensive medical examination. Current medical complaints include:{Blank single:19197::"none","***"}  She currently lives with: Menopausal Symptoms: {Blank single:19197::"yes","no"}  HYPERTENSION {Blank single:19197::"without","with"} Chronic Kidney Disease Hypertension status: {Blank single:19197::"controlled","uncontrolled","better","worse","exacerbated","stable"}  Satisfied with current treatment? {Blank single:19197::"yes","no"} Duration of hypertension: {Blank single:19197::"chronic","months","years"} BP monitoring frequency:  {Blank single:19197::"not checking","rarely","daily","weekly","monthly","a few times a day","a few times a week","a few times a month"} BP range:  BP medication side effects:  {Blank single:19197::"yes","no"} Medication compliance: {Blank single:19197::"excellent compliance","good compliance","fair compliance","poor compliance"} Previous BP meds:{Blank multiple:19196::"none","amlodipine","amlodipine/benazepril","atenolol","benazepril","benazepril/HCTZ","bisoprolol (bystolic)","carvedilol","chlorthalidone","clonidine","diltiazem","exforge HCT","HCTZ","irbesartan (avapro)","labetalol","lisinopril","lisinopril-HCTZ","losartan (cozaar)","methyldopa","nifedipine","olmesartan (benicar)","olmesartan-HCTZ","quinapril","ramipril","spironalactone","tekturna","valsartan","valsartan-HCTZ","verapamil"} Aspirin: {Blank single:19197::"yes","no"} Recurrent headaches: {Blank single:19197::"yes","no"} Visual changes: {Blank single:19197::"yes","no"} Palpitations: {Blank single:19197::"yes","no"} Dyspnea: {Blank single:19197::"yes","no"} Chest pain: {Blank single:19197::"yes","no"} Lower extremity edema: {Blank single:19197::"yes","no"} Dizzy/lightheaded: {Blank  single:19197::"yes","no"}  ANEMIA Anemia status: {Blank single:19197::"controlled","uncontrolled","better","worse","exacerbated","stable"} Etiology of anemia: Duration of anemia treatment:  Compliance with treatment: {Blank single:19197::"excellent compliance","good compliance","fair compliance","poor compliance"} Iron supplementation side effects: {Blank single:19197::"yes","no"} Severity of anemia: {Blank single:19197::"mild","moderate","severe"} Fatigue: {Blank single:19197::"yes","no"} Decreased exercise tolerance: {Blank single:19197::"yes","no"}  Dyspnea on exertion: {Blank single:19197::"yes","no"} Palpitations: {Blank single:19197::"yes","no"} Bleeding: {Blank single:19197::"yes","no"} Pica: {Blank single:19197::"yes","no"}  MOOD   Depression Screen done today and results listed below:     02/11/2023   11:04 AM 01/11/2023   10:04 AM 09/03/2022    1:08 PM 08/30/2022    9:09 AM 07/24/2022   11:05 AM  Depression screen PHQ 2/9  Decreased Interest 0 1  1 1   Down, Depressed, Hopeless 1 1  0 1  PHQ - 2 Score 1 2  1 2   Altered sleeping 2 2  2 2   Tired, decreased energy 2 3  2 2   Change in appetite 2 3  3 3   Feeling bad or failure about yourself  1 1  1 1   Trouble concentrating 0 0  1 1  Moving slowly or fidgety/restless 0 0  0 0  Suicidal thoughts 0 0  0 0  PHQ-9 Score 8 11  10 11   Difficult doing work/chores Somewhat difficult Not difficult at all  Somewhat difficult Somewhat difficult     Information is confidential and restricted. Go to Review Flowsheets to unlock data.    The patient {has/does not have:19849} a history of falls. I {did/did not:19850} complete a risk assessment for falls. A plan of care for falls {was/was not:19852} documented.   Past Medical History:  Past Medical History:  Diagnosis Date   ADD (attention deficit disorder)    Allergies    Anemia    Arthritis    Asthma    EXERCISE INDUCED   B12 deficiency    Bilateral ovarian cysts 11/13/2012    Bursitis of hip    Chronic back pain    Depression    Family history of adverse reaction to anesthesia    patient states father had a reaction to anesthesia about 20 years ago where his face and throat swelled, had to be reintubated."   Family history of celiac disease 07/28/2018   Family history of rheumatoid arthritis 08/27/2018   Maternal uncle   Fibromyalgia    Flat foot 08/27/2018   Status post bilateral foot reconstruction surgery and Achilles tendon lengthening in childhood.   Generalized headaches    GERD (gastroesophageal reflux disease)    H/O laparoscopic adjustable gastric banding 07/29/2013   10/17/10    History of removal of laparoscopic gastric banding  device 02/03/2018   Irregular menses    Joint pain    Lactose intolerance    Lower extremity edema    Migraines    Muscle pain    Obesity (BMI 30-39.9)    Osteoarthritis    Other allergic rhinitis 05/24/2020   Ovarian retention cyst 11/13/2012   Sleep apnea    SOB (shortness of breath)    Stomach ulcer    Swallowing difficulty    Vision problems    Vitamin D deficiency     Surgical History:  Past Surgical History:  Procedure Laterality Date   ACHILLES TENDON LENGTHENING Bilateral 1989   ANKLE GANGLION CYST EXCISION  02/2012   left ankle   ANTERIOR LAT LUMBAR FUSION Right 05/11/2019   Procedure: ANTERIOR LATERAL LUMBAR INTERBODY FUSION, LATERAL INSTRUMENTATION, RIGHT LUMBAR TWO- LUMBAR THREE;  Surgeon: Tressie Stalker, MD;  Location[facility] OR;  Service: Neurosurgery;  Laterality: Right;  ANTERIOR LATERAL LUMBAR INTERBODY FUSION, LATERAL INSTRUMENTATION, RIGHT LUMBAR TWO- LUMBAR THREE   BIOPSY  03/16/2019   Procedure: BIOPSY;  Surgeon: Corbin Ade, MD;  [facility];  Service: Endoscopy;;  Gastric    BIOPSY  06/28/2022   Procedure: BIOPSY;  Surgeon: Corbin Ade, MD;  [facility];  Service: Endoscopy;;   COLONOSCOPY WITH PROPOFOL N/A 03/16/2019   Dr. Jena Gauss: Normal terminal ileum, 15 cm.   Normal colon.  Next colonoscopy 10 years   COLONOSCOPY WITH PROPOFOL N/A 06/28/2022   Procedure: COLONOSCOPY WITH PROPOFOL;  Surgeon: Corbin Ade, MD;  [facility];  Service: Endoscopy;  Laterality: N/A;  W/ ILEOSCOPY, 11:00am, pt knows to arrive at 8:15   DILATION AND CURETTAGE OF UTERUS  2003   ENTEROSCOPY N/A 04/02/2019   Dr. Jena Gauss: bulbar erosions, jejunal ulcerations, attempted biopsy but scope fell backwards and lesions cannot be reidentified.  No evidence of ampullary lesion or mass.   ESOPHAGOGASTRODUODENOSCOPY (EGD) WITH PROPOFOL N/A 03/16/2019   Dr. Jena Gauss: Large hiatal hernia, Cameron/antral erosions, duodenal erosions.  Gastric biopsy showed reactive gastropathy, mild chronic gastritis, negative for H. pylori   ESOPHAGOGASTRODUODENOSCOPY (EGD) WITH PROPOFOL N/A 06/28/2022   Procedure: ESOPHAGOGASTRODUODENOSCOPY (EGD) WITH PROPOFOL;  Surgeon: Corbin Ade, MD;  [facility];  Service: Endoscopy;  Laterality: N/A;   FEET SURGERY Bilateral 1990   INSERTION OF BONE GRAFT IN FEET   GIVENS CAPSULE STUDY N/A 03/16/2019   Procedure: GIVENS CAPSULE STUDY;  Surgeon: Corbin Ade, MD;  [facility];  Service: Endoscopy;  Laterality: N/A;   LAPAROSCOPIC GASTRIC BANDING  10/17/2010   Dr Gaynelle Adu; [facility]   LAPAROSCOPIC REPAIR AND REMOVAL OF GASTRIC BAND  01/2018   LIPOMA EXCISION  02/2006   abdomen   LUMBAR LAMINECTOMY     SPINE SURGERY N/A    Phreesia 11/10/2020   TOTAL ABDOMINAL HYSTERECTOMY     TRANSTHORACIC ECHOCARDIOGRAM  11/2021   Normal LV size and function.  EF 60 to 65%.  Mild LVH.  GR 1 DD.  No MR.  Normal LA, RA size.  Normal RV size and function.  Unable to assess RVP.  Normal estimated RAP.  Normal valves.    Medications:  Current Outpatient Medications on File Prior to Visit  Medication Sig   albuterol (VENTOLIN HFA) 108 (90 Base) MCG/ACT inhaler Inhale 2 puffs into the lungs every 6 (six) hours as needed for wheezing.    aspirin EC 81 MG tablet Take 1 tablet by mouth every other day.   atorvastatin (  LIPITOR) 40 MG tablet TAKE 1 TABLET(40 MG) BY MOUTH DAILY   butalbital-acetaminophen-caffeine (FIORICET) 50-325-40 MG tablet Take 1 tablet by mouth every 6 (six) hours as needed for headache.   EPINEPHrine 0.3 mg/0.3 mL IJ SOAJ injection Inject 0.3 mg into the muscle as needed for anaphylaxis.   gabapentin (NEURONTIN) 300 MG capsule Take 1 capsule (300 mg total) by mouth 3 (three) times daily.   hydrochlorothiazide (HYDRODIURIL) 25 MG tablet Take 1 tablet (25 mg total) by mouth daily.   lamoTRIgine (LAMICTAL) 150 MG tablet Take 150 mg by mouth daily.   loratadine (CLARITIN) 10 MG tablet Take 10 mg by mouth daily.   OVER THE COUNTER MEDICATION Take 100 mg by mouth 2 (two) times daily as needed (pain). CBD (Patient not taking: Reported on 08/12/2023)   phentermine (ADIPEX-P) 37.5 MG tablet Take 37.5 mg by mouth every morning.   Probiotic Product (PROBIOTIC BLEND PO) Take 1 capsule by mouth daily.   QULIPTA 60 MG TABS Take 1 tablet by mouth daily.   traZODone (DESYREL) 100 MG tablet Take 1 tablet (100 mg total) by mouth at bedtime.   No current facility-administered medications on file prior to visit.    Allergies:  Allergies  Allergen Reactions   Cashew Nut Oil Anaphylaxis   Cauliflower [Brassica Oleracea] Anaphylaxis   Venlafaxine Other (See Comments)    Intense suicidal thought   Whey Anaphylaxis   Augmentin [Amoxicillin-Pot Clavulanate] Diarrhea and Nausea And Vomiting    Has patient had a PCN reaction causing immediate rash, facial/tongue/throat swelling, SOB or lightheadedness with hypotension: No Has patient had a PCN reaction causing severe rash involving mucus membranes or skin necrosis: No Has patient had a PCN reaction that required hospitalization: No Has patient had a PCN reaction occurring within the last 10 years: Unknown If all of the above answers are "NO", then may proceed with Cephalosporin  use.    Beeswax Itching and Swelling   Honey Itching and Swelling   Orange Fruit [Citrus] Other (See Comments)    ALL CITRUS FRUITS Migraines    Orange Oil Other (See Comments)   Wheat Diarrhea   Bee Pollen Swelling   Gluten Meal Other (See Comments)    Severe GI upset   Milk-Related Compounds    Other     epidural steroid injection - severe headaches, high fever   Codeine Rash   Tree Extract Itching    Tee tree oil    Social History:  Social History   Socioeconomic History   Marital status: Married    Spouse name: [name]   Number of children: 3   Years of education: Not on file   Highest education level: Not on file  Occupational History   Occupation: school counselor  Tobacco Use   Smoking status: Never   Smokeless tobacco: Never  Vaping Use   Vaping status: Never Used  Substance and Sexual Activity   Alcohol use: No   Drug use: No   Sexual activity: Yes  Other Topics Concern   Not on file  Social History Narrative   Lives with husband married 18 years Dec 2021      Oldest is 31- son lives in [place]   12 daughter and 28 son year olds in home      Cat: Snickers       Enjoy: cooking some, puzzles, reading       Diet: eats all food groups outside allergies    Caffeine: 2 cans of diet soda daily and  some tea   Water: 3 24 oz containers daily       Wears seat belt    Does not use phone while driving    Smoke Arts administrator -none          Social Determinants of Health   Financial Resource Strain: Not on file  Food Insecurity: Not on file  Transportation Needs: Not on file  Physical Activity: Not on file  Stress: Not on file  Social Connections: Not on file  Intimate Partner Violence: Not on file   Social History   Tobacco Use  Smoking Status Never  Smokeless Tobacco Never   Social History   Substance and Sexual Activity  Alcohol Use No    Family History:  Family History  Problem Relation Age of Onset    Hypertension Mother    Hyperlipidemia Mother    Diabetes Mother        pre-diabetes   Allergic rhinitis Mother    Asthma Mother    Urticaria Mother    Hypertension Father    Hyperlipidemia Father    Heart disease Father    Diabetes Father    Cancer Father        skin   Allergic rhinitis Father    Asthma Father    Urticaria Father    Diabetes Sister    Hypertension Sister    Allergic rhinitis Sister    Asthma Sister    Urticaria Sister    Pleurisy Son    Migraines Son    Heart disease Maternal Grandmother        V. Fib arrest   Cancer Maternal Grandfather        lung and skin   Colon cancer Maternal Grandfather    Cancer Paternal Grandfather        small bowel cancer and skin   Colon polyps Other        aunt and uncle   Eczema Neg Hx     Past medical history, surgical history, medications, allergies, family history and social history reviewed with patient today and changes made to appropriate areas of the chart.   ROS All other ROS negative except what is listed above and in the HPI.      Objective:    LMP 11/07/2012   Wt Readings from Last 3 Encounters:  08/12/23 (!) 305 lb (138.3 kg)  04/29/23 300 lb (136.1 kg)  02/11/23 (!) 302 lb 8 oz (137.2 kg)    Physical Exam  Results for orders placed or performed in visit on 02/21/23  HM MAMMOGRAPHY  Result Value Ref Range   HM Mammogram 0-4 Bi-Rad 0-4 Bi-Rad, Self Reported Normal  HM PAP SMEAR  Result Value Ref Range   HM Pap smear see result scanned into chart       Assessment & Plan:   Problem List Items Addressed This Visit       Cardiovascular and Mediastinum   Essential hypertension - Primary     Other   Morbid obesity with BMI of 50.0-59.9, adult (HCC) (Chronic)   Mixed hyperlipidemia (Chronic)   Major depression, recurrent, chronic (HCC)   Iron deficiency anemia   Vitamin D deficiency   Prediabetes     Follow up plan: No follow-ups on file.   LABORATORY TESTING:  - Pap smear: {Blank  single:19197::"pap done","not applicable","up to date","done elsewhere"}  IMMUNIZATIONS:   - Tdap: Tetanus vaccination status reviewed: {tetanus status:315746}. - Influenza: {Blank single:19197::"Up to date","Administered  today","Postponed to flu season","Refused","Given elsewhere"} - Pneumovax: {Blank single:19197::"Up to date","Administered today","Not applicable","Refused","Given elsewhere"} - Prevnar: {Blank single:19197::"Up to date","Administered today","Not applicable","Refused","Given elsewhere"} - COVID: {Blank single:19197::"Up to date","Administered today","Not applicable","Refused","Given elsewhere"} - HPV: {Blank single:19197::"Up to date","Administered today","Not applicable","Refused","Given elsewhere"} - Shingrix vaccine: {Blank single:19197::"Up to date","Administered today","Not applicable","Refused","Given elsewhere"}  SCREENING: -Mammogram: {Blank single:19197::"Up to date","Ordered today","Not applicable","Refused","Done elsewhere"}  - Colonoscopy: {Blank single:19197::"Up to date","Ordered today","Not applicable","Refused","Done elsewhere"}  - Bone Density: {Blank single:19197::"Up to date","Ordered today","Not applicable","Refused","Done elsewhere"}  -Hearing Test: {Blank single:19197::"Up to date","Ordered today","Not applicable","Refused","Done elsewhere"}  -Spirometry: {Blank single:19197::"Up to date","Ordered today","Not applicable","Refused","Done elsewhere"}   PATIENT COUNSELING:   Advised to take 1 mg of folate supplement per day if capable of pregnancy.   Sexuality: Discussed sexually transmitted diseases, partner selection, use of condoms, avoidance of unintended pregnancy  and contraceptive alternatives.   Advised to avoid cigarette smoking.  I discussed with the patient that most people either abstain from alcohol or drink within safe limits (<=14/week and <=4 drinks/occasion for males, <=7/weeks and <= 3 drinks/occasion for females) and that the risk for  alcohol disorders and other health effects rises proportionally with the number of drinks per week and how often a drinker exceeds daily limits.  Discussed cessation/primary prevention of drug use and availability of treatment for abuse.   Diet: Encouraged to adjust caloric intake to maintain  or achieve ideal body weight, to reduce intake of dietary saturated fat and total fat, to limit sodium intake by avoiding high sodium foods and not adding table salt, and to maintain adequate dietary potassium and calcium preferably from fresh fruits, vegetables, and low-fat dairy products.    stressed the importance of regular exercise  Injury prevention: Discussed safety belts, safety helmets, smoke detector, smoking near bedding or upholstery.   Dental health: Discussed importance of regular tooth brushing, flossing, and dental visits.    NEXT PREVENTATIVE PHYSICAL DUE IN 1 YEAR. No follow-ups on file.

## 2023-08-14 ENCOUNTER — Encounter: Payer: Self-pay | Admitting: Nurse Practitioner

## 2023-08-14 ENCOUNTER — Ambulatory Visit: Payer: BC Managed Care – PPO | Admitting: Nurse Practitioner

## 2023-08-14 ENCOUNTER — Other Ambulatory Visit: Payer: Self-pay | Admitting: Nurse Practitioner

## 2023-08-14 VITALS — BP 160/81 | HR 86 | Temp 98.1°F | Wt 304.2 lb

## 2023-08-14 DIAGNOSIS — I10 Essential (primary) hypertension: Secondary | ICD-10-CM

## 2023-08-14 DIAGNOSIS — D508 Other iron deficiency anemias: Secondary | ICD-10-CM

## 2023-08-14 DIAGNOSIS — R7303 Prediabetes: Secondary | ICD-10-CM

## 2023-08-14 DIAGNOSIS — Z23 Encounter for immunization: Secondary | ICD-10-CM

## 2023-08-14 DIAGNOSIS — F339 Major depressive disorder, recurrent, unspecified: Secondary | ICD-10-CM | POA: Diagnosis not present

## 2023-08-14 DIAGNOSIS — Z0000 Encounter for general adult medical examination without abnormal findings: Secondary | ICD-10-CM | POA: Diagnosis not present

## 2023-08-14 DIAGNOSIS — E559 Vitamin D deficiency, unspecified: Secondary | ICD-10-CM

## 2023-08-14 DIAGNOSIS — E782 Mixed hyperlipidemia: Secondary | ICD-10-CM

## 2023-08-14 DIAGNOSIS — Z6841 Body Mass Index (BMI) 40.0 and over, adult: Secondary | ICD-10-CM

## 2023-08-14 LAB — URINALYSIS, ROUTINE W REFLEX MICROSCOPIC
Bilirubin, UA: NEGATIVE
Glucose, UA: NEGATIVE
Ketones, UA: NEGATIVE
Leukocytes,UA: NEGATIVE
Nitrite, UA: NEGATIVE
Protein,UA: NEGATIVE
RBC, UA: NEGATIVE
Specific Gravity, UA: 1.02 (ref 1.005–1.030)
Urobilinogen, Ur: 1 mg/dL (ref 0.2–1.0)
pH, UA: 6 (ref 5.0–7.5)

## 2023-08-14 MED ORDER — VALSARTAN 80 MG PO TABS
80.0000 mg | ORAL_TABLET | Freq: Every day | ORAL | 0 refills | Status: DC
Start: 2023-08-14 — End: 2024-04-27

## 2023-08-14 NOTE — Assessment & Plan Note (Signed)
Labs ordered at visit today.  Will make recommendations based on lab results.   

## 2023-08-14 NOTE — Assessment & Plan Note (Signed)
Chronic.  Controlled.  Continue with current medication regimen of Atorvastatin daily.  Labs ordered today.  Return to clinic in 6 months for reevaluation.  Call sooner if concerns arise.

## 2023-08-14 NOTE — Telephone Encounter (Signed)
Requested Prescriptions  Pending Prescriptions Disp Refills   valsartan (DIOVAN) 80 MG tablet [Pharmacy Med Name: VALSARTAN 80MG  TABLETS] 90 tablet     Sig: TAKE 1 TABLET(80 MG) BY MOUTH DAILY     Cardiovascular:  Angiotensin Receptor Blockers Failed - 08/14/2023 11:47 AM      Failed - Cr in normal range and within 180 days    Creat  Date Value Ref Range Status  08/27/2018 0.86 0.50 - 1.10 mg/dL Final   Creatinine, Ser  Date Value Ref Range Status  02/11/2023 0.95 0.57 - 1.00 mg/dL Final         Failed - K in normal range and within 180 days    Potassium  Date Value Ref Range Status  02/11/2023 3.9 3.5 - 5.2 mmol/L Final         Failed - Last BP in normal range    BP Readings from Last 1 Encounters:  08/14/23 (!) 160/81         Passed - Patient is not pregnant      Passed - Valid encounter within last 6 months    Recent Outpatient Visits           Today Annual physical exam   [place] [facility] Larae Grooms, NP   6 months ago Vitamin D deficiency   [place] [facility] Larae Grooms, NP   7 months ago Morbid obesity with BMI of 50.0-59.9, adult [facility])   [place] [facility] Larae Grooms, NP   11 months ago Major depression, recurrent, chronic (HCC)   [place] [facility] Mecum, Oswaldo Conroy, PA-C   1 year ago Anxiety and depression   [place] [facility] Mecum, Oswaldo Conroy, PA-C       Future Appointments             In 1 month Larae Grooms, NP [place] [facility]

## 2023-08-14 NOTE — Assessment & Plan Note (Signed)
Chronic. Ongoing.  Working with provider at [facility].  Encouraged patient to make an appointment with [facility] for medication adjustment.  On Lamictal 150mg  daily.  Labs ordered today.  Follow up in 6 months.  Call sooner if concerns arise.

## 2023-08-14 NOTE — Assessment & Plan Note (Signed)
Chronic.  Not well controlled.  Continue with hydrochlorothiazide due to swelling in lower extremities.  Will also add Valsartan 80mg  daily.  Side effects and benefits of medication discussed.  Labs ordered.  Follow up in 1 month.  Call sooner if concerns arise.

## 2023-08-14 NOTE — Assessment & Plan Note (Signed)
Recommended eating smaller high protein, low fat meals more frequently and exercising 30 mins a day 5 times a week with a goal of 10-15lb weight loss in the next 3 months. Continue to follow up with [facility].

## 2023-08-15 LAB — TSH: TSH: 2.19 u[IU]/mL (ref 0.450–4.500)

## 2023-08-15 LAB — CBC WITH DIFFERENTIAL/PLATELET
Basophils Absolute: 0 10*3/uL (ref 0.0–0.2)
Basos: 1 %
EOS (ABSOLUTE): 0.3 10*3/uL (ref 0.0–0.4)
Eos: 5 %
Hematocrit: 42.2 % (ref 34.0–46.6)
Hemoglobin: 13.8 g/dL (ref 11.1–15.9)
Immature Grans (Abs): 0 10*3/uL (ref 0.0–0.1)
Immature Granulocytes: 0 %
Lymphocytes Absolute: 1.8 10*3/uL (ref 0.7–3.1)
Lymphs: 25 %
MCH: 30.5 pg (ref 26.6–33.0)
MCHC: 32.7 g/dL (ref 31.5–35.7)
MCV: 93 fL (ref 79–97)
Monocytes Absolute: 0.5 10*3/uL (ref 0.1–0.9)
Monocytes: 6 %
Neutrophils Absolute: 4.7 10*3/uL (ref 1.4–7.0)
Neutrophils: 63 %
Platelets: 301 10*3/uL (ref 150–450)
RBC: 4.53 x10E6/uL (ref 3.77–5.28)
RDW: 13.5 % (ref 11.7–15.4)
WBC: 7.4 10*3/uL (ref 3.4–10.8)

## 2023-08-15 LAB — LIPID PANEL
Chol/HDL Ratio: 3 {ratio} (ref 0.0–4.4)
Cholesterol, Total: 116 mg/dL (ref 100–199)
HDL: 39 mg/dL — ABNORMAL LOW (ref >39)
LDL Chol Calc (NIH): 56 mg/dL (ref 0–99)
Triglycerides: 112 mg/dL (ref 0–149)
VLDL Cholesterol Cal: 21 mg/dL (ref 5–40)

## 2023-08-15 LAB — COMPREHENSIVE METABOLIC PANEL
ALT: 19 [IU]/L (ref 0–32)
AST: 15 [IU]/L (ref 0–40)
Albumin: 4.3 g/dL (ref 3.9–4.9)
Alkaline Phosphatase: 125 [IU]/L — ABNORMAL HIGH (ref 44–121)
BUN/Creatinine Ratio: 10 (ref 9–23)
BUN: 11 mg/dL (ref 6–24)
Bilirubin Total: 0.5 mg/dL (ref 0.0–1.2)
CO2: 27 mmol/L (ref 20–29)
Calcium: 9.4 mg/dL (ref 8.7–10.2)
Chloride: 99 mmol/L (ref 96–106)
Creatinine, Ser: 1.06 mg/dL — ABNORMAL HIGH (ref 0.57–1.00)
Globulin, Total: 2.8 g/dL (ref 1.5–4.5)
Glucose: 89 mg/dL (ref 70–99)
Potassium: 3.6 mmol/L (ref 3.5–5.2)
Sodium: 140 mmol/L (ref 134–144)
Total Protein: 7.1 g/dL (ref 6.0–8.5)
eGFR: 64 mL/min/{1.73_m2} (ref >59)

## 2023-08-15 LAB — HEMOGLOBIN A1C
Est. average glucose Bld gHb Est-mCnc: 126 mg/dL
Hgb A1c MFr Bld: 6 % — ABNORMAL HIGH (ref 4.8–5.6)

## 2023-08-15 LAB — VITAMIN D 25 HYDROXY (VIT D DEFICIENCY, FRACTURES): Vit D, 25-Hydroxy: 38.7 ng/mL (ref 30.0–100.0)

## 2023-08-15 NOTE — Progress Notes (Signed)
HI Ms. [name]. It was nice to see you yesterday.  Your lab work looks good.  Your kidney's look like they could use some additional water.  Your A1c is well controlled at 6.0%.  No concerns at this time. Continue with your current medication regimen.  Follow up as discussed.  Please let me know if you have any questions.

## 2023-09-17 ENCOUNTER — Ambulatory Visit: Payer: BC Managed Care – PPO | Admitting: Nurse Practitioner

## 2023-10-26 ENCOUNTER — Other Ambulatory Visit: Payer: Self-pay | Admitting: Nurse Practitioner

## 2023-10-26 DIAGNOSIS — G43009 Migraine without aura, not intractable, without status migrainosus: Secondary | ICD-10-CM

## 2023-10-28 ENCOUNTER — Encounter (HOSPITAL_COMMUNITY): Payer: Self-pay | Admitting: Hematology

## 2023-10-29 ENCOUNTER — Other Ambulatory Visit: Payer: Self-pay

## 2023-10-29 DIAGNOSIS — G43009 Migraine without aura, not intractable, without status migrainosus: Secondary | ICD-10-CM

## 2023-10-29 MED ORDER — BUTALBITAL-APAP-CAFFEINE 50-325-40 MG PO TABS
1.0000 | ORAL_TABLET | Freq: Four times a day (QID) | ORAL | 0 refills | Status: DC | PRN
Start: 2023-10-29 — End: 2024-09-23

## 2023-10-31 NOTE — Telephone Encounter (Signed)
 Requested medication (s) are due for refill today: No  Requested medication (s) are on the active medication list: yes    Last refill: 10/29/23  #30  0 refills  Future visit scheduled no  Notes to clinic:Already responded to , receipt confirmed by pharmacy 10/29/23 at 9:28. Cannot refuse non-delegated meds per protocol.  Requested Prescriptions  Pending Prescriptions Disp Refills   butalbital -acetaminophen -caffeine  (FIORICET) 50-325-40 MG tablet [Pharmacy Med Name: BUT/ACETAMINOPHEN /CAFF 50-325-40 TB] 30 tablet     Sig: TAKE 1 TABLET BY MOUTH EVERY 6 HOURS AS NEEDED FOR HEADACHE     Not Delegated - Analgesics:  Non-Opioid Analgesic Combinations 2 Failed - 10/31/2023 12:06 PM      Failed - This refill cannot be delegated      Failed - Cr in normal range and within 360 days    Creat  Date Value Ref Range Status  08/27/2018 0.86 0.50 - 1.10 mg/dL Final   Creatinine, Ser  Date Value Ref Range Status  08/14/2023 1.06 (H) 0.57 - 1.00 mg/dL Final         Passed - eGFR is 10 or above and within 360 days    GFR, Est African American  Date Value Ref Range Status  08/27/2018 95 > OR = 60 mL/min/1.87m2 Final   GFR calc Af Amer  Date Value Ref Range Status  09/12/2020 77 >59 mL/min/1.73 Final    Comment:    **In accordance with recommendations from the NKF-ASN Task force,**   [facility] is in the process of updating its eGFR calculation to the   2021 CKD-EPI creatinine equation that estimates kidney function   without a race variable.    GFR, Est Non African American  Date Value Ref Range Status  08/27/2018 82 > OR = 60 mL/min/1.59m2 Final   GFR, Estimated  Date Value Ref Range Status  06/26/2022 >60 >60 mL/min Final    Comment:    (NOTE) Calculated using the CKD-EPI Creatinine Equation (2021)    GFR  Date Value Ref Range Status  02/25/2013 82.62 >60.00 mL/min Final   eGFR  Date Value Ref Range Status  08/14/2023 64 >59 mL/min/1.73 Final         Passed - Patient is not  pregnant      Passed - Valid encounter within last 12 months    Recent Outpatient Visits           2 months ago Annual physical exam   [place] [facility] Melvin Pao, NP   8 months ago Vitamin D  deficiency   [place] [facility] [place], Pao, NP   9 months ago Morbid obesity with BMI of 50.0-59.9, adult [facility])   [place] [facility] Melvin Pao, NP   1 year ago Major depression, recurrent, chronic (HCC)   [place] [facility] Mecum, Rocky BRAVO, PA-C   1 year ago Anxiety and depression   [place] [facility] Mecum, Erin E, PA-C

## 2023-11-26 ENCOUNTER — Other Ambulatory Visit (HOSPITAL_COMMUNITY): Payer: Self-pay | Admitting: Neurosurgery

## 2023-11-26 DIAGNOSIS — M4316 Spondylolisthesis, lumbar region: Secondary | ICD-10-CM

## 2023-11-28 ENCOUNTER — Encounter (HOSPITAL_COMMUNITY): Payer: Self-pay | Admitting: Hematology

## 2023-12-05 ENCOUNTER — Ambulatory Visit (HOSPITAL_COMMUNITY)
Admission: RE | Admit: 2023-12-05 | Discharge: 2023-12-05 | Disposition: A | Discharge status: Home / Self Care | Payer: 59 | Source: Ambulatory Visit | Attending: Neurosurgery | Admitting: Neurosurgery

## 2023-12-05 DIAGNOSIS — M4316 Spondylolisthesis, lumbar region: Secondary | ICD-10-CM | POA: Diagnosis present

## 2024-02-20 ENCOUNTER — Other Ambulatory Visit: Payer: Self-pay | Admitting: Nurse Practitioner

## 2024-02-21 ENCOUNTER — Encounter: Payer: Self-pay | Admitting: Emergency Medicine

## 2024-02-21 ENCOUNTER — Ambulatory Visit: Payer: Self-pay

## 2024-02-21 ENCOUNTER — Ambulatory Visit: Admission: EM | Admit: 2024-02-21 | Discharge: 2024-02-21 | Disposition: A | Discharge status: Home / Self Care

## 2024-02-21 DIAGNOSIS — H6992 Unspecified Eustachian tube disorder, left ear: Secondary | ICD-10-CM | POA: Diagnosis not present

## 2024-02-21 DIAGNOSIS — H9202 Otalgia, left ear: Secondary | ICD-10-CM

## 2024-02-21 NOTE — Telephone Encounter (Signed)
 Copied from CRM [id number]. Topic: Clinical - Medical Advice >> Feb 21, 2024  2:33 PM Loreda Rodriguez T wrote: Reason for CRM: patient having pain in her left ear. School nurse checked and patient has fluid behind her eardrum and the canal is red and irritated. Please contact patient as there were not appts for today  Chief Complaint: left ear ache Symptoms: stuffy, ache Frequency: for two days Pertinent Negatives: Patient denies runny nose Disposition: [] ED /[x] Urgent Care (no appt availability in office) / [] Appointment(In office/virtual)/ []  [facility]/ [] Home Care/ [] Refused Recommended Disposition /[] [facility]/ []  Follow-up with PCP Additional Notes: no apt's available; this RN called CAL line to see if anything was available;  patient had school nurse look in her ear and found fluid behind her eardrum and canal is red and irritated. No apt available; instructed to go to UC.  Care advice given, denies questions; instructed to go to ER if becomes worse.   Reason for Disposition  Ear congestion present > 48 hours  Answer Assessment - Initial Assessment Questions 1. LOCATION: "Which ear is involved?"       Left ear 2. SENSATION: "Describe how the ear feels." (e.g. stuffy, full, plugged)."      Stuffy and ear ache 3. ONSET:  "When did the ear symptoms start?"       2 days ago 4. PAIN: "Do you also have an earache?" If Yes, ask: "How bad is it?" (Scale 1-10; or mild, moderate, severe)     4-5/10 5. CAUSE: "What do you think is causing the ear congestion?"     Ear ache 6. URI: "Do you have a runny nose or cough?"      denies 7. NASAL ALLERGIES: "Are there symptoms of hay fever, such as sneezing or a clear nasal discharge?"     Yes, some times 8. PREGNANCY: "Is there any chance you are pregnant?" "When was your last menstrual period?"     na  Protocols used: Ear - Congestion-A-AH

## 2024-02-21 NOTE — ED Triage Notes (Signed)
 Pt c/o left ear pain. Started about 2 days ago. She states her school nurse looked at her ear and stated it looked red and fluid behind her ear drum. No drainage.

## 2024-02-21 NOTE — ED Provider Notes (Signed)
 [facility]    CSN: [id number] Arrival date & time: 02/21/24  1600      History   Chief Complaint Chief Complaint  Patient presents with   Otalgia    left    HPI [name] is a 48 y.o. female presenting for left ear pain that started yesterday and got worse today.  Patient says it always feels like there is fluid behind her left eardrum but never causes her pain or problems or hearing.  She denies any recent illness.  Denies fever, cough, congestion, sinus pain/pressure, sore throat and no jaw or dental problems.  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  The school nurse told her there was fluid behind her eardrum and the ear canal looked a little "red."  The school nurse advised her that she could have an ear infection and should be assessed for potential antibiotics.  Patient has taken gabapentin  but no OTC meds for her symptoms.  HPI  Past Medical History:  Diagnosis Date   ADD (attention deficit disorder)    Allergies    Anemia    Arthritis    Asthma    EXERCISE INDUCED   B12 deficiency    Bilateral ovarian cysts 11/13/2012   Bursitis of hip    Chronic back pain    Depression    Family history of adverse reaction to anesthesia    patient states father had a reaction to anesthesia about 20 years ago where his face and throat swelled, had to be reintubated."   Family history of celiac disease 07/28/2018   Family history of rheumatoid arthritis 08/27/2018   Maternal uncle   Fibromyalgia    Flat foot 08/27/2018   Status post bilateral foot reconstruction surgery and Achilles tendon lengthening in childhood.   Generalized headaches    GERD (gastroesophageal reflux disease)    H/O laparoscopic adjustable gastric banding 07/29/2013   10/17/10    History of removal of laparoscopic gastric banding device 02/03/2018   Irregular menses    Joint pain    Lactose intolerance    Lower extremity edema    Migraines     Muscle pain    Obesity (BMI 30-39.9)    Osteoarthritis    Other allergic rhinitis 05/24/2020   Ovarian retention cyst 11/13/2012   Sleep apnea    SOB (shortness of breath)    Stomach ulcer    Swallowing difficulty    Vision problems    Vitamin D  deficiency     Patient Active Problem List   Diagnosis Date Noted   CPAP use counseling 04/29/2023   Essential hypertension 04/29/2023   Menopausal vasomotor syndrome 04/29/2023   Hiatal hernia 05/11/2022   RLQ abdominal pain 05/11/2022   Deep vein thrombosis (DVT) of popliteal vein of right lower extremity (HCC) 01/11/2022   Hyponatremia 01/03/2022   Deep venous thrombosis (HCC) 01/03/2022   Bilateral lower extremity edema 11/22/2021   Chronic pain of right knee 01/11/2021   Primary osteoarthritis of right knee 01/11/2021   Vitamin D  deficiency 10/06/2020   Prediabetes 10/06/2020   Anxiety, with emotional eating 10/06/2020   Mixed hyperlipidemia 10/06/2020   Situational anxiety 09/08/2020   Migraine without aura and without status migrainosus, not intractable 09/08/2020   Mild intermittent asthma without complication 05/24/2020   Migraine 10/20/2019   Asthma 10/20/2019   Arthritis 10/20/2019   OSA on CPAP 07/09/2019   Degenerative scoliosis in adult patient 05/11/2019  Anxiety and depression 04/08/2019   Fibromyalgia 04/08/2019   Primary osteoarthritis of both knees 09/17/2018   Primary osteoarthritis of both hands 09/17/2018   DDD (degenerative disc disease), lumbar 08/27/2018   Multiple food allergies 07/28/2018   Iron  deficiency anemia 02/07/2018   Prolapsed lumbar disc 12/27/2017   Major depression, recurrent, chronic (HCC) 01/22/2011   Morbid obesity with BMI of 50.0-59.9, adult (HCC) 02/13/2010    Past Surgical History:  Procedure Laterality Date   ACHILLES TENDON LENGTHENING Bilateral 1989   ANKLE GANGLION CYST EXCISION  02/2012   left ankle   ANTERIOR LAT LUMBAR FUSION Right 05/11/2019   Procedure: ANTERIOR  LATERAL LUMBAR INTERBODY FUSION, LATERAL INSTRUMENTATION, RIGHT LUMBAR TWO- LUMBAR THREE;  Surgeon: Garry Kansas, MD;  Location: [facility] OR;  Service: Neurosurgery;  Laterality: Right;  ANTERIOR LATERAL LUMBAR INTERBODY FUSION, LATERAL INSTRUMENTATION, RIGHT LUMBAR TWO- LUMBAR THREE   BIOPSY  03/16/2019   Procedure: BIOPSY;  Surgeon: Suzette Espy, MD;  Location: [facility];  Service: Endoscopy;;  Gastric    BIOPSY  06/28/2022   Procedure: BIOPSY;  Surgeon: Suzette Espy, MD;  Location: [facility];  Service: Endoscopy;;   COLONOSCOPY WITH PROPOFOL  N/A 03/16/2019   Dr. Riley Cheadle: Normal terminal ileum, 15 cm.  Normal colon.  Next colonoscopy 10 years   COLONOSCOPY WITH PROPOFOL  N/A 06/28/2022   Procedure: COLONOSCOPY WITH PROPOFOL ;  Surgeon: Suzette Espy, MD;  Location: [facility];  Service: Endoscopy;  Laterality: N/A;  W/ ILEOSCOPY, 11:00am, pt knows to arrive at 8:15   DILATION AND CURETTAGE OF UTERUS  2003   ENTEROSCOPY N/A 04/02/2019   Dr. Riley Cheadle: bulbar erosions, jejunal ulcerations, attempted biopsy but scope fell backwards and lesions cannot be reidentified.  No evidence of ampullary lesion or mass.   ESOPHAGOGASTRODUODENOSCOPY (EGD) WITH PROPOFOL  N/A 03/16/2019   Dr. Riley Cheadle: Large hiatal hernia, Cameron/antral erosions, duodenal erosions.  Gastric biopsy showed reactive gastropathy, mild chronic gastritis, negative for H. pylori   ESOPHAGOGASTRODUODENOSCOPY (EGD) WITH PROPOFOL  N/A 06/28/2022   Procedure: ESOPHAGOGASTRODUODENOSCOPY (EGD) WITH PROPOFOL ;  Surgeon: Suzette Espy, MD;  Location: [facility];  Service: Endoscopy;  Laterality: N/A;   FEET SURGERY Bilateral 1990   INSERTION OF BONE GRAFT IN FEET   GIVENS CAPSULE STUDY N/A 03/16/2019   Procedure: GIVENS CAPSULE STUDY;  Surgeon: Suzette Espy, MD;  Location: [facility];  Service: Endoscopy;  Laterality: N/A;   LAPAROSCOPIC GASTRIC BANDING  10/17/2010   Dr Aldean Hummingbird; [facility]   LAPAROSCOPIC REPAIR AND  REMOVAL OF GASTRIC BAND  01/2018   LIPOMA EXCISION  02/2006   abdomen   LUMBAR LAMINECTOMY     SPINE SURGERY N/A    [employer]rees[employer] 11/10/2020   TOTAL ABDOMINAL HYSTERECTOMY     TRANSTHORACIC ECHOCARDIOGRAM  11/2021   Normal LV size and function.  EF 60 to 65%.  Mild LVH.  GR 1 DD.  No MR.  Normal LA, RA size.  Normal RV size and function.  Unable to assess RVP.  Normal estimated RAP.  Normal valves.    OB History     Gravida  3   Para  3   Term      Preterm      AB      Living         SAB      IAB      Ectopic      Multiple      Live Births  Home Medications    Prior to Admission medications   Medication Sig Start Date End Date Taking? Authorizing Provider  albuterol  (VENTOLIN  HFA) 108 (90 Base) MCG/ACT inhaler Inhale 2 puffs into the lungs every 6 (six) hours as needed for wheezing. 01/11/23  Yes Aileen Alexanders, NP  aspirin  EC 81 MG tablet Take 1 tablet by mouth every other day.   Yes [provider]  butalbital -acetaminophen -caffeine  (FIORICET) 50-325-40 MG tablet Take 1 tablet by mouth every 6 (six) hours as needed for headache. 10/29/23  Yes Aileen Alexanders, NP  gabapentin  (NEURONTIN ) 300 MG capsule Take 1 capsule (300 mg total) by mouth 3 (three) times daily. 02/11/23  Yes Aileen Alexanders, NP  hydrochlorothiazide  (HYDRODIURIL ) 25 MG tablet Take 1 tablet (25 mg total) by mouth daily. 08/30/22  Yes Mecum, Erin E, PA-C  lamoTRIgine (LAMICTAL) 150 MG tablet Take 150 mg by mouth daily. 01/10/23  Yes [provider]  levocetirizine (XYZAL) 5 MG tablet Take 5 mg by mouth every evening.   Yes [provider]  MOUNJARO  2.5 MG/0.5ML Pen  12/22/23  Yes [provider]  Probiotic Product (PROBIOTIC BLEND PO) Take 1 capsule by mouth daily.   Yes [provider]  QULIPTA 60 MG TABS Take 1 tablet by mouth daily.   Yes [provider]  traZODone  (DESYREL ) 100 MG tablet Take 1 tablet (100 mg total) by mouth at  bedtime. 02/11/23  Yes Aileen Alexanders, NP  valsartan  (DIOVAN ) 80 MG tablet Take 1 tablet (80 mg total) by mouth daily. 08/14/23  Yes Aileen Alexanders, NP  atorvastatin  (LIPITOR) 40 MG tablet TAKE 1 TABLET(40 MG) BY MOUTH DAILY 02/21/24   Aileen Alexanders, NP  EPINEPHrine  0.3 mg/0.3 mL IJ SOAJ injection Inject 0.3 mg into the muscle as needed for anaphylaxis.    [provider]  loratadine (CLARITIN) 10 MG tablet Take 10 mg by mouth daily.    [provider]  phentermine (ADIPEX-P) 37.5 MG tablet Take 37.5 mg by mouth every morning.    [provider]    Family History Family History  Problem Relation Age of Onset   Hypertension Mother    Hyperlipidemia Mother    Diabetes Mother        pre-diabetes   Allergic rhinitis Mother    Asthma Mother    Urticaria Mother    Hypertension Father    Hyperlipidemia Father    Heart disease Father    Diabetes Father    Cancer Father        skin   Allergic rhinitis Father    Asthma Father    Urticaria Father    Diabetes Sister    Hypertension Sister    Allergic rhinitis Sister    Asthma Sister    Urticaria Sister    Pleurisy Son    Migraines Son    Heart disease Maternal Grandmother        V. Fib arrest   Cancer Maternal Grandfather        lung and skin   Colon cancer Maternal Grandfather    Cancer Paternal Grandfather        small bowel cancer and skin   Colon polyps Other        aunt and uncle   Eczema Neg Hx     Social History Social History   Tobacco Use   Smoking status: Never   Smokeless tobacco: Never  Vaping Use   Vaping status: Never Used  Substance Use Topics   Alcohol use: No  Drug use: No     Allergies   Cashew nut oil, Cauliflower [brassica oleracea], Venlafaxine , Whey, Augmentin [amoxicillin -pot clavulanate], Beeswax, Honey, Orange fruit [citrus], Orange oil, Wheat, Bee pollen, Gluten meal, Milk-related compounds, Other, Codeine, and Tree extract   Review of Systems Review of  Systems  Constitutional:  Negative for chills, diaphoresis, fatigue and fever.  HENT:  Positive for ear pain. Negative for congestion, ear discharge, facial swelling, hearing loss, rhinorrhea, sinus pressure, sinus pain and sore throat.   Respiratory:  Negative for cough.   Gastrointestinal:  Negative for nausea and vomiting.  Skin:  Negative for rash.  Neurological:  Negative for dizziness, weakness and headaches.  Hematological:  Negative for adenopathy.     Physical Exam Triage Vital Signs ED Triage Vitals  Encounter Vitals Group     BP 02/21/24 1628 116/76     Systolic BP Percentile --      Diastolic BP Percentile --      Pulse Rate 02/21/24 1628 68     Resp 02/21/24 1628 16     Temp 02/21/24 1628 98.3 F (36.8 C)     Temp Source 02/21/24 1628 Oral     SpO2 02/21/24 1628 95 %     Weight 02/21/24 1625 (!) 304 lb 3.8 oz (138 kg)     Height 02/21/24 1625 5\' 4"  (1.626 m)     Head Circumference --      Peak Flow --      Pain Score 02/21/24 1624 4     Pain Loc --      Pain Education --      Exclude from Growth Chart --    No data found.  Updated Vital Signs BP 116/76 (BP Location: Left Arm)   Pulse 68   Temp 98.3 F (36.8 C) (Oral)   Resp 16   Ht 5\' 4"  (1.626 m)   Wt (!) 304 lb 3.8 oz (138 kg)   LMP 11/07/2012   SpO2 95%   BMI 52.22 kg/m     Physical Exam Vitals and nursing note reviewed.  Constitutional:      General: She is not in acute distress.    Appearance: Normal appearance. She is not ill-appearing or toxic-appearing.  HENT:     Head: Normocephalic and atraumatic.     Right Ear: Tympanic membrane, ear canal and external ear normal.     Left Ear: Ear canal and external ear normal. A middle ear effusion is present.     Nose: Nose normal.     Mouth/Throat:     Mouth: Mucous membranes are moist.     Pharynx: Oropharynx is clear.  Eyes:     General: No scleral icterus.       Right eye: No discharge.        Left eye: No discharge.      Conjunctiva/sclera: Conjunctivae normal.  Cardiovascular:     Rate and Rhythm: Normal rate and regular rhythm.     Heart sounds: Normal heart sounds.  Pulmonary:     Effort: Pulmonary effort is normal. No respiratory distress.     Breath sounds: Normal breath sounds.  Musculoskeletal:     Cervical back: Neck supple.  Skin:    General: Skin is dry.  Neurological:     General: No focal deficit present.     Mental Status: She is alert. Mental status is at baseline.     Motor: No weakness.     Gait: Gait normal.  Psychiatric:  Mood and Affect: Mood normal.        Behavior: Behavior normal.      UC Treatments / Results  Labs (all labs ordered are listed, but only abnormal results are displayed) Labs Reviewed - No data to display  EKG   Radiology No results found.  Procedures Procedures (including critical care time)  Medications Ordered in UC Medications - No data to display  Initial Impression / Assessment and Plan / UC Course  I have reviewed the triage vital signs and the nursing notes.  Pertinent labs & imaging results that were available during my care of the patient were reviewed by me and considered in my medical decision making (see chart for details).    48 year old female presents for left ear pain that started yesterday.  Reports history of ear fullness at this side but says it does not usually affect her hearing or cause pain.  No recent illness and denies current congestion, sinus pain, sore throat, fever, ear drainage.  Has not taken any OTC meds or use nasal sprays for symptoms.  Vitals are stable and normal and she is overall well-appearing.  No acute distress.  On exam has clear effusion to left TM without bulging or perforation.  No erythema of TM, EAC and no debris of ear canal or associated swelling.  No pain with otoscopic exam.  No nasal congestion.  Throat clear.   Reviewed with patient that she does not have an ear infection.  Explained that  she likely has eustachian tube dysfunction and fluid behind the eardrum causing her symptoms and suggested Flonase , Mucinex D, nasal saline, ibuprofen, Tylenol  and warm compresses.  Patient is frustrated at having to pay a "$70 co-pay" to learn that she does not have an ear infection and does not need antibiotic when she thought that is what she might need.  Explained to patient that I am sorry that she feels poorly relieving things including her medications should help to dry up the fluid and eventually relieve her discomfort but it did not happen overnight advised her to return for fever, ear drainage, sudden worsening of hearing, sudden worsening of pain, etc.   Final Clinical Impressions(s) / UC Diagnoses   Final diagnoses:  Otalgia of left ear  Dysfunction of left eustachian tube     Discharge Instructions      - You do not have an ear infection.  You have a little bit of fluid behind the eardrum. - I suggested Flonase  and Mucinex D, ibuprofen, Tylenol , warm compresses.  This will get better but the fluid has to dry up before the discomfort completely goes away. - If you develop fever or have hearing issues please return for reevaluation.   ED Prescriptions   None    PDMP not reviewed this encounter.   Floydene Hy, PA-C 02/21/24 1704

## 2024-02-21 NOTE — Telephone Encounter (Signed)
 Last OV within protocol, 08/12/23 for CPE.  Requested Prescriptions  Pending Prescriptions Disp Refills   atorvastatin  (LIPITOR) 40 MG tablet [Pharmacy Med Name: ATORVASTATIN  40MG  TABLETS] 90 tablet 0    Sig: TAKE 1 TABLET(40 MG) BY MOUTH DAILY     Cardiovascular:  Antilipid - Statins Failed - 02/21/2024 10:28 AM      Failed - Valid encounter within last 12 months    Recent Outpatient Visits   None            Failed - Lipid Panel in normal range within the last 12 months    Cholesterol, Total  Date Value Ref Range Status  08/14/2023 116 100 - 199 mg/dL Final   LDL Chol Calc (NIH)  Date Value Ref Range Status  08/14/2023 56 0 - 99 mg/dL Final   HDL  Date Value Ref Range Status  08/14/2023 39 (L) >39 mg/dL Final   Triglycerides  Date Value Ref Range Status  08/14/2023 112 0 - 149 mg/dL Final         Passed - Patient is not pregnant

## 2024-02-21 NOTE — Discharge Instructions (Signed)
-   You do not have an ear infection.  You have a little bit of fluid behind the eardrum. - I suggested Flonase  and Mucinex D, ibuprofen, Tylenol , warm compresses.  This will get better but the fluid has to dry up before the discomfort completely goes away. - If you develop fever or have hearing issues please return for reevaluation.

## 2024-02-22 ENCOUNTER — Encounter: Payer: Self-pay | Admitting: Nurse Practitioner

## 2024-02-24 MED ORDER — ATORVASTATIN CALCIUM 40 MG PO TABS: 40.0000 mg | ORAL_TABLET | Freq: Every day | ORAL | 1 refills | Status: DC

## 2024-04-24 NOTE — Progress Notes (Signed)
 [facility] [address] [place], [place] [address]  Pulmonary Sleep Medicine   Office Visit Note  Patient Name: [name] DOB: August 13, 1976 MRN [id number]    Chief Complaint: Obstructive Sleep Apnea visit  Brief History:  [name] is seen today for an annual follow up visit for APAP@ 6-20 cmH2O. The patient has a 5 year history of sleep apnea. Patient is using PAP nightly.  The patient feels rested after sleeping with PAP.  The patient reports benefiting from PAP use. Reported sleepiness is  improved and the Epworth Sleepiness Score is 7 out of 24. The patient does not take naps. The patient complains of the following: some fatigue despite the machine. Patient is in need of new supplies.  The compliance download shows 99% compliance with an average use time of 7 hours 34 minutes. The AHI is 1.2.  The patient does not complain of limb movements disrupting sleep. The patient continues to require PAP therapy in order to eliminate sleep apnea.   ROS  General: (-) fever, (-) chills, (-) night sweat Nose and Sinuses: (-) nasal stuffiness or itchiness, (-) postnasal drip, (-) nosebleeds, (-) sinus trouble. Mouth and Throat: (-) sore throat, (-) hoarseness. Neck: (-) swollen glands, (-) enlarged thyroid, (-) neck pain. Respiratory: - cough, - shortness of breath, - wheezing. Neurologic: + numbness, + tingling. Psychiatric: + anxiety, + depression   Current Medication: Outpatient Encounter Medications as of 04/27/2024  Medication Sig   testosterone cypionate (DEPO-TESTOSTERONE) 200 MG/ML injection Inject 150 mg every 4 weeks by intramuscular route.   albuterol  (VENTOLIN  HFA) 108 (90 Base) MCG/ACT inhaler Inhale 2 puffs into the lungs every 6 (six) hours as needed for wheezing.   aspirin  EC 81 MG tablet Take 1 tablet by mouth every other day.   atorvastatin  (LIPITOR) 40 MG tablet Take 1 tablet (40 mg total) by mouth daily.   butalbital -acetaminophen -caffeine  (FIORICET)  50-325-40 MG tablet Take 1 tablet by mouth every 6 (six) hours as needed for headache.   EPINEPHrine  0.3 mg/0.3 mL IJ SOAJ injection Inject 0.3 mg into the muscle as needed for anaphylaxis.   gabapentin  (NEURONTIN ) 300 MG capsule Take 1 capsule (300 mg total) by mouth 3 (three) times daily.   hydrochlorothiazide  (HYDRODIURIL ) 25 MG tablet Take 1 tablet (25 mg total) by mouth daily.   lamoTRIgine (LAMICTAL) 150 MG tablet Take 150 mg by mouth daily.   levocetirizine (XYZAL) 5 MG tablet Take 5 mg by mouth every evening.   loratadine (CLARITIN) 10 MG tablet Take 10 mg by mouth daily.   MOUNJARO  5 MG/0.5ML Pen Inject 5 mg into the skin once a week.   Probiotic Product (PROBIOTIC BLEND PO) Take 1 capsule by mouth daily.   QULIPTA 60 MG TABS Take 1 tablet by mouth daily.   traZODone  (DESYREL ) 100 MG tablet Take 1 tablet (100 mg total) by mouth at bedtime.   valsartan  (DIOVAN ) 40 MG tablet TAKE 1 TABLET BY MOUTH DAILY AT NIGHT   [DISCONTINUED] MOUNJARO  2.5 MG/0.5ML Pen    [DISCONTINUED] phentermine (ADIPEX-P) 37.5 MG tablet Take 37.5 mg by mouth every morning.   [DISCONTINUED] valsartan  (DIOVAN ) 80 MG tablet Take 1 tablet (80 mg total) by mouth daily.   No facility-administered encounter medications on file as of 04/27/2024.    Surgical History: Past Surgical History:  Procedure Laterality Date   ACHILLES TENDON LENGTHENING Bilateral 1989   ANKLE GANGLION CYST EXCISION  02/2012   left ankle   ANTERIOR LAT LUMBAR FUSION Right 05/11/2019  Procedure: ANTERIOR LATERAL LUMBAR INTERBODY FUSION, LATERAL INSTRUMENTATION, RIGHT LUMBAR TWO- LUMBAR THREE;  Surgeon: Pam Purchase, MD;  Location: [facility] OR;  Service: Neurosurgery;  Laterality: Right;  ANTERIOR LATERAL LUMBAR INTERBODY FUSION, LATERAL INSTRUMENTATION, RIGHT LUMBAR TWO- LUMBAR THREE   BIOPSY  03/16/2019   Procedure: BIOPSY;  Surgeon: Pam Lamar HERO, MD;  Location: [facility] SUITE;  Service: Endoscopy;;  Gastric    BIOPSY  06/28/2022    Procedure: BIOPSY;  Surgeon: Pam Lamar HERO, MD;  Location: [facility];  Service: Endoscopy;;   COLONOSCOPY WITH PROPOFOL  N/A 03/16/2019   Dr. Shaaron: Normal terminal ileum, 15 cm.  Normal colon.  Next colonoscopy 10 years   COLONOSCOPY WITH PROPOFOL  N/A 06/28/2022   Procedure: COLONOSCOPY WITH PROPOFOL ;  Surgeon: Pam Lamar HERO, MD;  Location: [facility];  Service: Endoscopy;  Laterality: N/A;  W/ ILEOSCOPY, 11:00am, pt knows to arrive at 8:15   DILATION AND CURETTAGE OF UTERUS  2003   ENTEROSCOPY N/A 04/02/2019   Dr. Shaaron: bulbar erosions, jejunal ulcerations, attempted biopsy but scope fell backwards and lesions cannot be reidentified.  No evidence of ampullary lesion or mass.   ESOPHAGOGASTRODUODENOSCOPY (EGD) WITH PROPOFOL  N/A 03/16/2019   Dr. Shaaron: Large hiatal hernia, Cameron/antral erosions, duodenal erosions.  Gastric biopsy showed reactive gastropathy, mild chronic gastritis, negative for H. pylori   ESOPHAGOGASTRODUODENOSCOPY (EGD) WITH PROPOFOL  N/A 06/28/2022   Procedure: ESOPHAGOGASTRODUODENOSCOPY (EGD) WITH PROPOFOL ;  Surgeon: Pam Lamar HERO, MD;  Location: [facility];  Service: Endoscopy;  Laterality: N/A;   FEET SURGERY Bilateral 1990   INSERTION OF BONE GRAFT IN FEET   GIVENS CAPSULE STUDY N/A 03/16/2019   Procedure: GIVENS CAPSULE STUDY;  Surgeon: Pam Lamar HERO, MD;  Location: [facility];  Service: Endoscopy;  Laterality: N/A;   LAPAROSCOPIC GASTRIC BANDING  10/17/2010   Dr [name]; [facility]   LAPAROSCOPIC REPAIR AND REMOVAL OF GASTRIC BAND  01/2018   LIPOMA EXCISION  02/2006   abdomen   LUMBAR LAMINECTOMY     SPINE SURGERY N/A    Phreesia 11/10/2020   TOTAL ABDOMINAL HYSTERECTOMY     TRANSTHORACIC ECHOCARDIOGRAM  11/2021   Normal LV size and function.  EF 60 to 65%.  Mild LVH.  GR 1 DD.  No MR.  Normal LA, RA size.  Normal RV size and function.  Unable to assess RVP.  Normal estimated RAP.  Normal valves.    Medical History: Past Medical  History:  Diagnosis Date   ADD (attention deficit disorder)    Allergies    Anemia    Arthritis    Asthma    EXERCISE INDUCED   B12 deficiency    Bilateral ovarian cysts 11/13/2012   Bursitis of hip    Chronic back pain    Depression    Family history of adverse reaction to anesthesia    patient states father had a reaction to anesthesia about 20 years ago where his face and throat swelled, had to be reintubated.   Family history of celiac disease 07/28/2018   Family history of rheumatoid arthritis 08/27/2018   Maternal uncle   Fibromyalgia    Flat foot 08/27/2018   Status post bilateral foot reconstruction surgery and Achilles tendon lengthening in childhood.   Generalized headaches    GERD (gastroesophageal reflux disease)    H/O laparoscopic adjustable gastric banding 07/29/2013   10/17/10    History of removal of laparoscopic gastric banding device 02/03/2018   Irregular menses    Joint pain  Lactose intolerance    Lower extremity edema    Migraines    Muscle pain    Obesity (BMI 30-39.9)    Osteoarthritis    Other allergic rhinitis 05/24/2020   Ovarian retention cyst 11/13/2012   Sleep apnea    SOB (shortness of breath)    Stomach ulcer    Swallowing difficulty    Vision problems    Vitamin D  deficiency     Family History: Non contributory to the present illness  Social History: Social History   Socioeconomic History   Marital status: Married    Spouse name: Belvie   Number of children: 3   Years of education: Not on file   Highest education level: Not on file  Occupational History   Occupation: school counselor  Tobacco Use   Smoking status: Never   Smokeless tobacco: Never  Vaping Use   Vaping status: Never Used  Substance and Sexual Activity   Alcohol use: No   Drug use: No   Sexual activity: Yes  Other Topics Concern   Not on file  Social History Narrative   Lives with husband married 18 years Dec 2021      Oldest is 48- son lives in  [place]   12 daughter and 81 son year olds in home      Cat: Snickers       Enjoy: cooking some, puzzles, reading       Diet: eats all food groups outside allergies    Caffeine : 2 cans of diet soda daily and some tea   Water: 3 24 oz containers daily       Wears seat belt    Does not use phone while driving    Smoke Arts administrator -none          Social Drivers of Corporate investment banker Strain: Not on file  Food Insecurity: Not on file  Transportation Needs: Not on file  Physical Activity: Not on file  Stress: Not on file  Social Connections: Not on file  Intimate Partner Violence: Not on file    Vital Signs: Pulse 92, resp. rate 16, height 5' 5 (1.651 m), weight 284 lb (128.8 kg), last menstrual period 11/07/2012, SpO2 98%. Body mass index is 47.26 kg/m.    Examination: General Appearance: The patient is well-developed, well-nourished, and in no distress. Neck Circumference: 44 cm Skin: Gross inspection of skin unremarkable. Head: normocephalic, no gross deformities. Eyes: no gross deformities noted. ENT: ears appear grossly normal Neurologic: Alert and oriented. No involuntary movements.  STOP BANG RISK ASSESSMENT S (snore) Have you been told that you snore?     NO   T (tired) Are you often tired, fatigued, or sleepy during the day?   NO  O (obstruction) Do you stop breathing, choke, or gasp during sleep? NO   P (pressure) Do you have or are you being treated for high blood pressure? NO   B (BMI) Is your body index greater than 35 kg/m? YES   A (age) Are you 8 years old or older? NO   N (neck) Do you have a neck circumference greater than 16 inches?   YES   G (gender) Are you a female? NO   TOTAL STOP/BANG "YES" ANSWERS 2       A STOP-Bang score of 2 or less is considered low risk, and a score of 5 or more is high risk for having either moderate or severe  OSA. For people who score 3 or 4, doctors may need to perform  further assessment to determine how likely they are to have OSA.         EPWORTH SLEEPINESS SCALE:  Scale:  (0)= no chance of dozing; (1)= slight chance of dozing; (2)= moderate chance of dozing; (3)= high chance of dozing  Chance  Situtation    Sitting and reading: 2    Watching TV: 1    Sitting Inactive in public: 0    As a passenger in car: 1      Lying down to rest: 1    Sitting and talking: 0    Sitting quielty after lunch: 1    In a car, stopped in traffic: 1   TOTAL SCORE:   7 out of 24    SLEEP STUDIES:  PSG (06/2019) AHI 17/hr, RDI 29/hr, min SpO2 80%    CPAP COMPLIANCE DATA:  Date Range: 04/25/2023-04/23/2024  Average Daily Use: 7 hours 34 minutes  Median Use: 7 hours 30 minutes  Compliance for > 4 Hours: 99%  AHI: 1.2 respiratory events per hour  Days Used: 365/365 days  Mask Leak: 6.9  95th Percentile Pressure: 12.2         LABS: No results found for this or any previous visit (from the past 2160 hours).  Radiology: No results found.  No results found.  No results found.    Assessment and Plan: Patient Active Problem List   Diagnosis Date Noted   CPAP use counseling 04/29/2023   Essential hypertension 04/29/2023   Menopausal vasomotor syndrome 04/29/2023   Hiatal hernia 05/11/2022   RLQ abdominal pain 05/11/2022   Deep vein thrombosis (DVT) of popliteal vein of right lower extremity (HCC) 01/11/2022   Hyponatremia 01/03/2022   Deep venous thrombosis (HCC) 01/03/2022   Bilateral lower extremity edema 11/22/2021   Chronic pain of right knee 01/11/2021   Primary osteoarthritis of right knee 01/11/2021   Vitamin D  deficiency 10/06/2020   Prediabetes 10/06/2020   Anxiety, with emotional eating 10/06/2020   Mixed hyperlipidemia 10/06/2020   Situational anxiety 09/08/2020   Migraine without aura and without status migrainosus, not intractable 09/08/2020   Mild intermittent asthma without complication 05/24/2020    Migraine 10/20/2019   Asthma 10/20/2019   Arthritis 10/20/2019   OSA on CPAP 07/09/2019   Degenerative scoliosis in adult patient 05/11/2019   Anxiety and depression 04/08/2019   Fibromyalgia 04/08/2019   Primary osteoarthritis of both knees 09/17/2018   Primary osteoarthritis of both hands 09/17/2018   DDD (degenerative disc disease), lumbar 08/27/2018   Multiple food allergies 07/28/2018   Iron  deficiency anemia 02/07/2018   Prolapsed lumbar disc 12/27/2017   Major depression, recurrent, chronic (HCC) 01/22/2011   Morbid obesity with BMI of 50.0-59.9, adult (HCC) 02/13/2010    1. OSA on CPAP (Primary) The patient does tolerate PAP and reports  benefit from PAP use. The patient was reminded how to clean equipment and advised to replace supplies routinely. The patient was also counselled on weight loss. The compliance is excellent. The AHI is 1.2.   OSA on cpap- controlled. Continue with excellent compliance with pap. CPAP continues to be medically necessary to treat this patient's OSA. F/u one year.    2. CPAP use counseling CPAP Counseling: had a lengthy discussion with the patient regarding the importance of PAP therapy in management of the sleep apnea. Patient appears to understand the risk factor reduction and also understands the risks associated with untreated sleep  apnea. Patient will try to make a good faith effort to remain compliant with therapy. Also instructed the patient on proper cleaning of the device including the water must be changed daily if possible and use of distilled water is preferred. Patient understands that the machine should be regularly cleaned with appropriate recommended cleaning solutions that do not damage the PAP machine for example given white vinegar and water rinses. Other methods such as ozone treatment may not be as good as these simple methods to achieve cleaning.     General Counseling: I have discussed the findings of the evaluation and  examination with [facility].  I have also discussed any further diagnostic evaluation thatmay be needed or ordered today. Kareem verbalizes understanding of the findings of todays visit. We also reviewed her medications today and discussed drug interactions and side effects including but not limited excessive drowsiness and altered mental states. We also discussed that there is always a risk not just to her but also people around her. she has been encouraged to call the office with any questions or concerns that should arise related to todays visit.  No orders of the defined types were placed in this encounter.       I have personally obtained a history, examined the patient, evaluated laboratory and imaging results, formulated the assessment and plan and placed orders. This patient was seen today by Lauraine Lay, PA-C in collaboration with Dr. Elfreda Bathe.   Elfreda DELENA Bathe, MD [facility] Diplomate ABMS Pulmonary Critical Care Medicine and Sleep Medicine

## 2024-04-27 ENCOUNTER — Ambulatory Visit (INDEPENDENT_AMBULATORY_CARE_PROVIDER_SITE_OTHER): Payer: Self-pay | Admitting: Internal Medicine

## 2024-04-27 VITALS — HR 92 | Resp 16 | Ht 65.0 in | Wt 284.0 lb

## 2024-04-27 DIAGNOSIS — Z7189 Other specified counseling: Secondary | ICD-10-CM | POA: Diagnosis not present

## 2024-04-27 DIAGNOSIS — G4733 Obstructive sleep apnea (adult) (pediatric): Secondary | ICD-10-CM

## 2024-04-27 NOTE — Patient Instructions (Signed)

## 2024-05-31 ENCOUNTER — Encounter (HOSPITAL_COMMUNITY): Payer: Self-pay | Admitting: Hematology

## 2024-06-08 ENCOUNTER — Other Ambulatory Visit: Payer: Self-pay | Admitting: *Deleted

## 2024-08-12 NOTE — Progress Notes (Signed)
 Cardiology Clinic Note   Date: 08/14/2024 ID: [name], [name] 1976-04-18, MRN [id number]  Primary Cardiologist:  Alm Clay, MD  Chief Complaint   [name] is a 48 y.o. female who presents to the clinic today for routine follow up.   Patient Profile   [name] is followed by Dr. Clay for the history outlined below.      Past medical history significant for: Dyspnea.  Echo 11/30/2021: EF 60-65%. No RWMA. Mild LVH. Grade I DD. Normal RV function. No significant valvular abnormalities.  DVT.  Vascular US  lower extremities 01/03/2022: Findings consistent with acute DVT right popliteal vein, no cystic structure in popliteal fossa, all other veins appear fully compressible and demonstrate appropriate Doppler characteristics. No evidence of left common femoral vein obstruction, no cystic structure found in the popliteal fossa.  Vascular US  lower extremities 04/27/2022: No evidence of DVT in bilateral lower extremities from common femoral through popliteal veins. No evidence of superficial venous thrombosis in the lower extremities bilaterally. No evidence of deep venous insufficiency bilaterally. Prior right popliteal DVT has resolved. Venous reflux is noted in the left greater saphenous vein in the thigh.  Hypertension.  Hyperlipidemia.  Lipid panel 08/14/2023: LDL 56, HDL 39, TG 112, total 116.  Migraines.  OSA.  On CPAP 100% compliance.  Asthma.  Fibromyalgia.  In summary, patient was first evaluated by Dr. Clay on 11/23/2021 for bilateral lower extremity edema and pre-op clearance prior to back surgery. She reported taking hydrochlorothiazide  for hypertension and to manage edema. She had taken Lasix  in the past but did not like taking it. She reported mild DOE which she attributed to back pain and deconditioning. Echo showed normal LV/RV function with grade I DD. Patient was evaluated by Dr. Burnard as a DOD work-in 3 weeks after back surgery for progressive lower  extremity edema not responding to hydrochlorothiazide . She reported baseline dyspnea unchanged. Lower extremity US  showed acute DVT of right popliteal vein and she was started on Xarelto .    Patient was seen in the office on 05/03/2022 for routine follow up. Repeat lower extremity US  in June 2023 showed resolution of DVT. She was instructed to transition to daily aspirin  when course of Xarelto  completed.   Patient was last seen in the office by me on 08/12/2023 for routine follow-up.  She reported chronic edema in ankles and knees unchanged from previous typically normal in the morning and progressing throughout the day.  Physical activity is limited by arthritis in bilateral knees and back pain.  BP control was adequate.  No medication changes were made.     History of Present Illness    Today, patient is doing well. Patient denies orthopnea or PND. Mild dyspnea secondary to asthma that improves with inhalers. She has chronic ankle edema that is at its best in the morning and progresses throughout the day.  This is unchanged from previous. She restricts dietary sodium.  No chest pain, pressure, or tightness. No palpitations.  Her activity is limited secondary to back and knee pain. She is working on improving diet and weight loss. She has been out of valsartan  for several weeks. When she was taking it regularly BP was dropping into the 90s systolic.     ROS: All other systems reviewed and are otherwise negative except as noted in History of Present Illness.  EKGs/Labs Reviewed    EKG Interpretation Date/Time:  Friday August 14 2024 15:40:03 EDT Ventricular Rate:  71 PR Interval:  150 QRS  Duration:  92 QT Interval:  386 QTC Calculation: 419 R Axis:   -13  Text Interpretation: Normal sinus rhythm Low voltage QRS Nonspecirfic T wave abnormalities When compared with ECG of 12-Aug-2023 09:02, No significant change was found Confirmed by Loistine Pam [phone]) on 08/14/2024 3:49:35 PM    Physical Exam    VS:  BP 116/66   Pulse 71   Ht 5' 5.5 (1.664 m)   Wt 290 lb 6.4 oz (131.7 kg)   LMP 11/07/2012   SpO2 96%   BMI 47.59 kg/m  , BMI Body mass index is 47.59 kg/m.  GEN: Well nourished, well developed, in no acute distress. Neck: No JVD or carotid bruits. Cardiac:  RRR.  No murmur. No rubs or gallops.   Respiratory:  Respirations regular and unlabored. Clear to auscultation without rales, wheezing or rhonchi. GI: Soft, nontender, nondistended. Extremities: Radials/DP/PT 2+ and equal bilaterally. No clubbing or cyanosis. Mild nonpitting edema bilateral ankles.   Skin: Warm and dry, no rash. Neuro: Strength intact.  Assessment & Plan   Hypertension BP 116/66. No report of headaches or dizziness.  - Stop valsartan .  - Continue HCTZ.  - Increase physical activity. Try seated exercises.  - Continue to work on weight loss.    Hyperlipidemia LDL 56 October 2024, at goal. - Continue atorvastatin . - Repeat lipid panel with PCP in November.    Asthma/Dyspnea/Lower extremity edema Controlled with occasional use of inhaler during illness or exposure to allergens. Echo February 2023 showed normal LV/RV function with grade I DD. Mild nonpitting ankle edema. Chronic and stable per patient.  - Continue to restrict sodium.  - No changes in management.   Deep Vein Thrombosis (DVT) history No current symptoms of DVT. Last ultrasound in June 2023 showed no clots. Patient continues to experience ankle swelling. Typically normal in the mornings and progresses throughout the day. Mild, nonpitting edema bilateral ankles. No longer on anticoagulation. - Continue aspirin .    OSA Patient is compliant with CPAP use.  - Encouraged continued use of CPAP.  Disposition: Stop valsartan . Return in 1 year or sooner as needed.          Signed, Pam HERO. Margaret Cockerill, DNP, NP-C

## 2024-08-13 ENCOUNTER — Ambulatory Visit: Admitting: Student

## 2024-08-14 ENCOUNTER — Encounter: Payer: Self-pay | Admitting: Student

## 2024-08-14 ENCOUNTER — Ambulatory Visit: Attending: Student | Admitting: Student

## 2024-08-14 VITALS — BP 116/66 | HR 71 | Ht 65.5 in | Wt 290.4 lb

## 2024-08-14 DIAGNOSIS — R0600 Dyspnea, unspecified: Secondary | ICD-10-CM

## 2024-08-14 DIAGNOSIS — Z86718 Personal history of other venous thrombosis and embolism: Secondary | ICD-10-CM

## 2024-08-14 DIAGNOSIS — I10 Essential (primary) hypertension: Secondary | ICD-10-CM

## 2024-08-14 DIAGNOSIS — E782 Mixed hyperlipidemia: Secondary | ICD-10-CM | POA: Diagnosis not present

## 2024-08-14 DIAGNOSIS — G4733 Obstructive sleep apnea (adult) (pediatric): Secondary | ICD-10-CM

## 2024-08-14 DIAGNOSIS — J4520 Mild intermittent asthma, uncomplicated: Secondary | ICD-10-CM

## 2024-08-14 NOTE — Patient Instructions (Signed)
 Medication Instructions:  Your physician recommends the following medication changes.  STOP TAKING: Valsartan   *If you need a refill on your cardiac medications before your next appointment, please call your pharmacy*  Lab Work: No labs ordered today  If you have labs (blood work) drawn today and your tests are completely normal, you will receive your results only by: MyChart Message (if you have MyChart) OR A paper copy in the mail If you have any lab test that is abnormal or we need to change your treatment, we will call you to review the results.  Testing/Procedures: No test ordered today   Follow-Up: At [facility], you and your health needs are our priority.  As part of our continuing mission to provide you with exceptional heart care, our providers are all part of one team.  This team includes your primary Cardiologist (physician) and Advanced Practice Providers or APPs (Physician Assistants and Nurse Practitioners) who all work together to provide you with the care you need, when you need it.  Your next appointment:   12 month(s)  Provider:   You may see Alm Clay, MD or one of the following Advanced Practice Providers on your designated Care Team:   Lonni Meager, NP Lesley Maffucci, PA-C Bernardino Bring, PA-C [employer], PA-C Tylene Lunch, NP Barnie Hila, NP

## 2024-08-27 ENCOUNTER — Ambulatory Visit: Admitting: Nurse Practitioner

## 2024-09-23 ENCOUNTER — Ambulatory Visit: Admitting: Family Medicine

## 2024-09-23 ENCOUNTER — Encounter: Payer: Self-pay | Admitting: Family Medicine

## 2024-09-23 VITALS — BP 112/78 | HR 80 | Ht 65.5 in | Wt 279.2 lb

## 2024-09-23 DIAGNOSIS — E1169 Type 2 diabetes mellitus with other specified complication: Secondary | ICD-10-CM

## 2024-09-23 DIAGNOSIS — E65 Localized adiposity: Secondary | ICD-10-CM | POA: Insufficient documentation

## 2024-09-23 DIAGNOSIS — R632 Polyphagia: Secondary | ICD-10-CM | POA: Insufficient documentation

## 2024-09-23 DIAGNOSIS — F411 Generalized anxiety disorder: Secondary | ICD-10-CM | POA: Insufficient documentation

## 2024-09-23 DIAGNOSIS — Z9884 Bariatric surgery status: Secondary | ICD-10-CM | POA: Insufficient documentation

## 2024-09-23 DIAGNOSIS — G43009 Migraine without aura, not intractable, without status migrainosus: Secondary | ICD-10-CM

## 2024-09-23 DIAGNOSIS — Z6841 Body Mass Index (BMI) 40.0 and over, adult: Secondary | ICD-10-CM

## 2024-09-23 DIAGNOSIS — R600 Localized edema: Secondary | ICD-10-CM

## 2024-09-23 DIAGNOSIS — M47816 Spondylosis without myelopathy or radiculopathy, lumbar region: Secondary | ICD-10-CM | POA: Insufficient documentation

## 2024-09-23 DIAGNOSIS — E785 Hyperlipidemia, unspecified: Secondary | ICD-10-CM

## 2024-09-23 DIAGNOSIS — R29818 Other symptoms and signs involving the nervous system: Secondary | ICD-10-CM | POA: Insufficient documentation

## 2024-09-23 DIAGNOSIS — Z23 Encounter for immunization: Secondary | ICD-10-CM

## 2024-09-23 DIAGNOSIS — M4300 Spondylolysis, site unspecified: Secondary | ICD-10-CM | POA: Insufficient documentation

## 2024-09-23 DIAGNOSIS — E119 Type 2 diabetes mellitus without complications: Secondary | ICD-10-CM | POA: Insufficient documentation

## 2024-09-23 DIAGNOSIS — I152 Hypertension secondary to endocrine disorders: Secondary | ICD-10-CM

## 2024-09-23 DIAGNOSIS — F5104 Psychophysiologic insomnia: Secondary | ICD-10-CM

## 2024-09-23 DIAGNOSIS — M797 Fibromyalgia: Secondary | ICD-10-CM

## 2024-09-23 DIAGNOSIS — E1159 Type 2 diabetes mellitus with other circulatory complications: Secondary | ICD-10-CM

## 2024-09-23 DIAGNOSIS — E66813 Obesity, class 3: Secondary | ICD-10-CM

## 2024-09-23 DIAGNOSIS — F419 Anxiety disorder, unspecified: Secondary | ICD-10-CM

## 2024-09-23 DIAGNOSIS — M48061 Spinal stenosis, lumbar region without neurogenic claudication: Secondary | ICD-10-CM | POA: Insufficient documentation

## 2024-09-23 MED ORDER — ONDANSETRON 4 MG PO TBDP: 4.0000 mg | ORAL_TABLET | Freq: Three times a day (TID) | ORAL | 1 refills | Status: AC | PRN

## 2024-09-23 MED ORDER — TRAZODONE HCL 100 MG PO TABS: 100.0000 mg | ORAL_TABLET | Freq: Every day | ORAL | 1 refills | Status: AC

## 2024-09-23 MED ORDER — BUTALBITAL-APAP-CAFFEINE 50-325-40 MG PO TABS: 1.0000 | ORAL_TABLET | Freq: Four times a day (QID) | ORAL | 0 refills | Status: AC | PRN

## 2024-09-23 MED ORDER — LAMOTRIGINE 150 MG PO TABS: 150.0000 mg | ORAL_TABLET | Freq: Every day | ORAL | 0 refills | Status: DC

## 2024-09-23 MED ORDER — MOUNJARO 5 MG/0.5ML ~~LOC~~ SOAJ: 5.0000 mg | SUBCUTANEOUS | 1 refills | Status: AC

## 2024-09-23 MED ORDER — HYDROCHLOROTHIAZIDE 25 MG PO TABS: 25.0000 mg | ORAL_TABLET | Freq: Every day | ORAL | 1 refills | Status: DC

## 2024-09-23 MED ORDER — QULIPTA 60 MG PO TABS: 1.0000 | ORAL_TABLET | Freq: Every day | ORAL | 0 refills | Status: DC

## 2024-09-23 MED ORDER — ATORVASTATIN CALCIUM 40 MG PO TABS: 40.0000 mg | ORAL_TABLET | Freq: Every day | ORAL | 1 refills | Status: AC

## 2024-09-23 NOTE — Progress Notes (Signed)
 Assessment and Plan 1. Type 2 diabetes mellitus with other specified complication, without long-term current use of insulin (HCC)   2. Migraine without aura and without status migrainosus, not intractable   3. Psychophysiological insomnia   4. Bilateral lower extremity edema   5. Hypertension associated with diabetes (HCC)   6. Hyperlipidemia associated with type 2 diabetes mellitus (HCC)   7. Anxiety disorder with panic attacks   8. Fibromyalgia   9. Class 3 severe obesity with serious comorbidity and body mass index (BMI) of 45.0 to 49.9 in adult, unspecified obesity type [facility])    Assessment & Plan Obesity Weight decreased from 290 lbs to 276 lbs. Mounjaro  effective. Improved portion control and active weight maintenance. - Continue Mounjaro . - Encouraged portion control and regular exercise.  Migraine without aura Increased migraine frequency due to medication shortage. Using Fioricet more frequently. - Refilled Fioricet prescription.  Insomnia Difficulty sleeping due to running out of trazodone . Medication effective when taken regularly. - Refilled trazodone  prescription.  Edema Persistent ankle swelling, especially with salt intake. Hydrochlorothiazide  used daily. Cardiologist suggested dose reduction if needed. - Continue hydrochlorothiazide  25 mg daily. - Consider reducing to 12.5 mg if swelling decreases.  Depression and Anxiety Managed with current medications. External factors and cyclical patterns contribute to anxiety.  - Continue current medications.  Hypertension Blood pressure well-controlled off valsartan . Cardiologist discontinued valsartan . Hydrochlorothiazide  used for edema. - Continue monitoring blood pressure. - Continue hydrochlorothiazide  as needed for edema.  General Health Maintenance Discussed shingles and pneumonia vaccinations. Eligible for shingles vaccine. Declined COVID vaccine. - Encouraged annual flu vaccination.  Geni Shutter, DO, MS,  FAAFP, Dipl. [facility] [address] [place], [address] Dept: [phone] Dept Fax: [phone]  Subjective:   Patient is well known to me from my previous clinic and is now establishing care with me as their primary care provider.  Discussed the use of AI scribe software for clinical note transcription with the patient, who gave verbal consent to proceed. History of Present Illness [name] is a 48 year old female who presents for medication refills and follow-up on weight management and hormone therapy.  Obesity and weight management - Weight decreased from 309 pounds in December 2024 to 276 pounds currently - Mounjaro  used for weight loss - Occasional nausea on the second day after Mounjaro  administration, managed with Zofran   Migraine headaches - Increased frequency of headaches due to spacing out migraine medication doses secondary to availability issues - Fioricet used more frequently for headache management - Current migraine medications include Qulipta  and Fioricet  Hormone therapy - Testosterone injections administered every three months - Insurance coverage issues for increased testosterone dosage - Considering discontinuation or adjustment of hormone therapy  Blood pressure and edema - Valsartan  discontinued after recent cardiology evaluation due to low blood pressure - Continues hydrochlorothiazide  for ankle swelling - Considering future adjustments to hydrochlorothiazide  dosage based on ongoing evaluation  Sleep disturbance - Sleep disruption attributed to being out of trazodone   Preventive health and immunizations - Considering vaccinations for shingles and pneumonia - Plans to receive influenza vaccination  All past medical history, surgical history, allergies, family history, immunizations andmedications were updated in the EMR today and reviewed under the history and medication portions of their  EMR. Review of Systems: Negative, with the exception of above mentioned in HPI.  Current Outpatient Medications:    albuterol  (VENTOLIN  HFA) 108 (90 Base) MCG/ACT inhaler, Inhale 2 puffs into the lungs every 6 (  six) hours as needed for wheezing., Disp: 18 g, Rfl: 0   aspirin  EC 81 MG tablet, Take 1 tablet by mouth every other day., Disp: , Rfl:    clonazePAM (KLONOPIN) 0.5 MG tablet, Take 0.5 mg by mouth daily., Disp: , Rfl:    EPINEPHrine  0.3 mg/0.3 mL IJ SOAJ injection, Inject 0.3 mg into the muscle as needed for anaphylaxis., Disp: , Rfl:    gabapentin  (NEURONTIN ) 300 MG capsule, Take 1 capsule (300 mg total) by mouth 3 (three) times daily., Disp: 270 capsule, Rfl: 1   levocetirizine (XYZAL) 5 MG tablet, Take 5 mg by mouth every evening., Disp: , Rfl:    Probiotic Product (PROBIOTIC BLEND PO), Take 1 capsule by mouth daily., Disp: , Rfl:    testosterone cypionate (DEPO-TESTOSTERONE) 200 MG/ML injection, Inject 150 mg into the muscle every 3 (three) months., Disp: , Rfl:    ALPRAZolam (XANAX) 0.5 MG tablet, Take 0.5 mg by mouth daily., Disp: , Rfl:    atorvastatin  (LIPITOR) 40 MG tablet, Take 1 tablet (40 mg total) by mouth daily., Disp: 90 tablet, Rfl: 1   butalbital -acetaminophen -caffeine  (FIORICET) 50-325-40 MG tablet, Take 1 tablet by mouth every 6 (six) hours as needed for headache., Disp: 90 tablet, Rfl: 0   celecoxib  (CELEBREX ) 100 MG capsule, Take 100 mg by mouth daily as needed., Disp: , Rfl:    cyclobenzaprine  (FLEXERIL ) 10 MG tablet, Take 10 mg by mouth at bedtime., Disp: , Rfl:    hydrochlorothiazide  (HYDRODIURIL ) 25 MG tablet, Take 1 tablet (25 mg total) by mouth daily., Disp: 30 tablet, Rfl: 1   lamoTRIgine  (LAMICTAL ) 150 MG tablet, Take 1 tablet (150 mg total) by mouth daily., Disp: 90 tablet, Rfl: 0   MOUNJARO  5 MG/0.5ML Pen, Inject 5 mg into the skin once a week., Disp: 6 mL, Rfl: 1   ondansetron  (ZOFRAN -ODT) 4 MG disintegrating tablet, Take 1 tablet (4 mg total) by mouth every  8 (eight) hours as needed., Disp: 20 tablet, Rfl: 1   QULIPTA  60 MG TABS, Take 1 tablet (60 mg total) by mouth daily., Disp: 90 tablet, Rfl: 0   traZODone  (DESYREL ) 100 MG tablet, Take 1 tablet (100 mg total) by mouth at bedtime., Disp: 90 tablet, Rfl: 1   Objective:   BP 112/78 (BP Location: Right Arm, Cuff Size: Large)   Pulse 80   Ht 5' 5.5 (1.664 m)   Wt 279 lb 3.2 oz (126.6 kg)   LMP 11/07/2012   SpO2 97%   BMI 45.75 kg/m   Wt Readings from Last 3 Encounters:  09/23/24 279 lb 3.2 oz (126.6 kg)  08/14/24 290 lb 6.4 oz (131.7 kg)  04/27/24 284 lb (128.8 kg)   Physical Exam Vitals and nursing note reviewed.  HENT:     Head: Normocephalic and atraumatic.  Eyes:     Pupils: Pupils are equal, round, and reactive to light.  Cardiovascular:     Rate and Rhythm: Normal rate and regular rhythm.     Heart sounds: Normal heart sounds.  Pulmonary:     Effort: Pulmonary effort is normal.  Abdominal:     Palpations: Abdomen is soft.  Musculoskeletal:     Cervical back: Normal range of motion and neck supple.  Skin:    General: Skin is warm.  Psychiatric:        Behavior: Behavior normal.    Lab Results  Component Value Date   CREATININE 1.06 (H) 08/14/2023   BUN 11 08/14/2023   NA 140 08/14/2023  K 3.6 08/14/2023   CL 99 08/14/2023   CO2 27 08/14/2023   Lab Results  Component Value Date   ALT 19 08/14/2023   AST 15 08/14/2023   ALKPHOS 125 (H) 08/14/2023   BILITOT 0.5 08/14/2023   Lab Results  Component Value Date   HGBA1C 6.0 (H) 08/14/2023   HGBA1C 6.3 (H) 02/11/2023   HGBA1C 5.3 12/27/2021   HGBA1C 5.6 07/26/2021   HGBA1C 5.5 02/22/2021   Lab Results  Component Value Date   INSULIN 30.2 (H) 07/26/2021   INSULIN 40.6 (H) 09/12/2020   Lab Results  Component Value Date   TSH 2.190 08/14/2023   Lab Results  Component Value Date   CHOL 116 08/14/2023   HDL 39 (L) 08/14/2023   LDLCALC 56 08/14/2023   TRIG 112 08/14/2023   CHOLHDL 3.0 08/14/2023    Lab Results  Component Value Date   VD25OH 38.7 08/14/2023   VD25OH 42.7 07/26/2021   VD25OH 51.5 02/22/2021   Lab Results  Component Value Date   WBC 7.4 08/14/2023   HGB 13.8 08/14/2023   HCT 42.2 08/14/2023   MCV 93 08/14/2023   PLT 301 08/14/2023   Lab Results  Component Value Date   IRON  68 02/22/2021   TIBC 315 02/22/2021   FERRITIN 119 02/22/2021

## 2024-09-24 DIAGNOSIS — G43009 Migraine without aura, not intractable, without status migrainosus: Secondary | ICD-10-CM

## 2024-09-28 MED ORDER — QULIPTA 60 MG PO TABS: 1.0000 | ORAL_TABLET | Freq: Every day | ORAL | 2 refills | Status: AC

## 2024-09-28 NOTE — Telephone Encounter (Signed)
 Dr Prentiss, Please see message below from the pt. She needs a 30 day script sent to [facility]. I have this pending to send.  PA team, could we please check to see if pts Qulipta  needs a PA? Please  and Thank you

## 2024-09-29 ENCOUNTER — Telehealth: Payer: Self-pay

## 2024-09-29 ENCOUNTER — Other Ambulatory Visit (HOSPITAL_COMMUNITY): Payer: Self-pay

## 2024-09-29 NOTE — Telephone Encounter (Signed)
 Pharmacy Patient Advocate Encounter   Received notification from RX Request Messages that prior authorization for Qulipta  60MG  tablets  is required/requested.   Insurance verification completed.   The patient is insured through [facility].   Per test claim: PA required; PA submitted to above mentioned insurance via Latent Key/confirmation #/EOC [id number] Status is pending

## 2024-09-29 NOTE — Telephone Encounter (Signed)
 Pharmacy Patient Advocate Encounter  Received notification from [facility] that Prior Authorization for Qulipta  60MG  tablets  has been APPROVED from 09/29/24 to 12/28/24. Ran test claim, Copay is $0. This test claim was processed through [facility]- copay amounts may vary at other pharmacies due to pharmacy/plan contracts, or as the patient moves through the different stages of their insurance plan.

## 2024-09-29 NOTE — Telephone Encounter (Signed)
 Message sent to pt.

## 2024-09-30 NOTE — Telephone Encounter (Signed)
 I have called [facility] and spoke with [employer]. She instructed me to tell the pt to remove the medication from her cart and try re-adding it. This should apply the insurance approval. I called [facility] and gave her this information. She reports that she hadnt added the prescription to her cart at all yet. I gave Ivelise the number to [facility] so that she could call and speak with them as well. Normagene will try calling them first to see if they can get this issue straightened out. If they can't, and the rx is still over $1000, she will send me a message and we will send her a script to [facility] so pt can pick it up locally.

## 2024-10-31 ENCOUNTER — Other Ambulatory Visit: Payer: Self-pay | Admitting: Family Medicine

## 2024-10-31 DIAGNOSIS — R600 Localized edema: Secondary | ICD-10-CM

## 2024-11-30 ENCOUNTER — Other Ambulatory Visit: Payer: Self-pay | Admitting: Family Medicine

## 2024-11-30 DIAGNOSIS — R600 Localized edema: Secondary | ICD-10-CM

## 2024-12-01 ENCOUNTER — Telehealth: Payer: Self-pay | Admitting: Pharmacy Technician

## 2024-12-01 ENCOUNTER — Other Ambulatory Visit (HOSPITAL_COMMUNITY): Payer: Self-pay

## 2024-12-04 ENCOUNTER — Other Ambulatory Visit: Payer: Self-pay | Admitting: Family Medicine

## 2024-12-04 DIAGNOSIS — F419 Anxiety disorder, unspecified: Secondary | ICD-10-CM

## 2024-12-25 ENCOUNTER — Ambulatory Visit: Admitting: Family Medicine
